# Patient Record
Sex: Female | Born: 1950 | Race: Black or African American | Hispanic: No | Marital: Married | Smoking: Never smoker
Health system: Southern US, Community
[De-identification: ages and names within clinical notes are randomized; demographics above are authoritative.]

## PROBLEM LIST (undated history)

## (undated) DIAGNOSIS — M1990 Unspecified osteoarthritis, unspecified site: Secondary | ICD-10-CM

## (undated) DIAGNOSIS — Z973 Presence of spectacles and contact lenses: Secondary | ICD-10-CM

## (undated) DIAGNOSIS — N189 Chronic kidney disease, unspecified: Secondary | ICD-10-CM

## (undated) DIAGNOSIS — G4730 Sleep apnea, unspecified: Secondary | ICD-10-CM

## (undated) DIAGNOSIS — E785 Hyperlipidemia, unspecified: Secondary | ICD-10-CM

## (undated) DIAGNOSIS — Z95810 Presence of automatic (implantable) cardiac defibrillator: Secondary | ICD-10-CM

## (undated) DIAGNOSIS — Z4502 Encounter for adjustment and management of automatic implantable cardiac defibrillator: Secondary | ICD-10-CM

## (undated) DIAGNOSIS — G5603 Carpal tunnel syndrome, bilateral upper limbs: Secondary | ICD-10-CM

## (undated) DIAGNOSIS — E669 Obesity, unspecified: Secondary | ICD-10-CM

## (undated) DIAGNOSIS — I10 Essential (primary) hypertension: Secondary | ICD-10-CM

## (undated) DIAGNOSIS — K219 Gastro-esophageal reflux disease without esophagitis: Secondary | ICD-10-CM

## (undated) HISTORY — PX: ABDOMINAL HYSTERECTOMY: SHX81

## (undated) HISTORY — DX: Encounter for adjustment and management of automatic implantable cardiac defibrillator: Z45.02

## (undated) HISTORY — PX: ICD IMPLANT: EP1208

## (undated) HISTORY — DX: Unspecified osteoarthritis, unspecified site: M19.90

## (undated) HISTORY — PX: JOINT REPLACEMENT: SHX530

## (undated) HISTORY — PX: CHOLECYSTECTOMY: SHX55

## (undated) HISTORY — DX: Obesity, unspecified: E66.9

## (undated) HISTORY — PX: DILATION AND CURETTAGE OF UTERUS: SHX78

---
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
## 1999-10-11 ENCOUNTER — Encounter: Admission: RE | Admit: 1999-10-11 | Discharge: 1999-10-11 | Payer: Self-pay | Admitting: Internal Medicine

## 1999-10-11 ENCOUNTER — Encounter: Payer: Self-pay | Admitting: Internal Medicine

## 1999-10-14 ENCOUNTER — Encounter: Admission: RE | Admit: 1999-10-14 | Discharge: 2000-01-12 | Payer: Self-pay | Admitting: Internal Medicine

## 2000-01-10 ENCOUNTER — Encounter: Admission: RE | Admit: 2000-01-10 | Discharge: 2000-01-10 | Payer: Self-pay | Admitting: Internal Medicine

## 2000-01-10 ENCOUNTER — Encounter: Payer: Self-pay | Admitting: Internal Medicine

## 2000-03-30 ENCOUNTER — Encounter: Payer: Self-pay | Admitting: Internal Medicine

## 2000-03-30 ENCOUNTER — Encounter: Admission: RE | Admit: 2000-03-30 | Discharge: 2000-03-30 | Payer: Self-pay | Admitting: Internal Medicine

## 2000-06-04 ENCOUNTER — Encounter: Payer: Self-pay | Admitting: Gastroenterology

## 2000-06-04 ENCOUNTER — Ambulatory Visit (HOSPITAL_COMMUNITY): Admission: RE | Admit: 2000-06-04 | Discharge: 2000-06-04 | Payer: Self-pay | Admitting: Gastroenterology

## 2000-06-10 ENCOUNTER — Ambulatory Visit (HOSPITAL_COMMUNITY): Admission: RE | Admit: 2000-06-10 | Discharge: 2000-06-10 | Payer: Self-pay | Admitting: Gastroenterology

## 2000-07-15 ENCOUNTER — Ambulatory Visit (HOSPITAL_COMMUNITY): Admission: RE | Admit: 2000-07-15 | Discharge: 2000-07-15 | Payer: Self-pay | Admitting: Gastroenterology

## 2001-06-29 ENCOUNTER — Encounter: Payer: Self-pay | Admitting: Internal Medicine

## 2001-06-29 ENCOUNTER — Encounter: Admission: RE | Admit: 2001-06-29 | Discharge: 2001-06-29 | Payer: Self-pay | Admitting: Internal Medicine

## 2001-08-19 ENCOUNTER — Encounter: Payer: Self-pay | Admitting: Surgery

## 2001-08-23 ENCOUNTER — Encounter (INDEPENDENT_AMBULATORY_CARE_PROVIDER_SITE_OTHER): Payer: Self-pay | Admitting: *Deleted

## 2001-08-23 ENCOUNTER — Ambulatory Visit (HOSPITAL_COMMUNITY): Admission: RE | Admit: 2001-08-23 | Discharge: 2001-08-24 | Payer: Self-pay | Admitting: Surgery

## 2001-08-31 ENCOUNTER — Other Ambulatory Visit: Admission: RE | Admit: 2001-08-31 | Discharge: 2001-08-31 | Payer: Self-pay | Admitting: Obstetrics and Gynecology

## 2002-07-18 ENCOUNTER — Encounter: Payer: Self-pay | Admitting: Internal Medicine

## 2002-07-18 ENCOUNTER — Ambulatory Visit (HOSPITAL_COMMUNITY): Admission: RE | Admit: 2002-07-18 | Discharge: 2002-07-18 | Payer: Self-pay | Admitting: Internal Medicine

## 2002-08-15 ENCOUNTER — Encounter: Admission: RE | Admit: 2002-08-15 | Discharge: 2002-08-15 | Payer: Self-pay | Admitting: Gastroenterology

## 2002-08-15 ENCOUNTER — Encounter: Payer: Self-pay | Admitting: Gastroenterology

## 2003-06-29 ENCOUNTER — Encounter: Admission: RE | Admit: 2003-06-29 | Discharge: 2003-06-29 | Payer: Self-pay | Admitting: Family Medicine

## 2003-07-13 ENCOUNTER — Encounter: Admission: RE | Admit: 2003-07-13 | Discharge: 2003-07-13 | Payer: Self-pay | Admitting: Family Medicine

## 2004-03-27 ENCOUNTER — Encounter: Admission: RE | Admit: 2004-03-27 | Discharge: 2004-03-27 | Payer: Self-pay | Admitting: Internal Medicine

## 2004-04-08 ENCOUNTER — Encounter: Admission: RE | Admit: 2004-04-08 | Discharge: 2004-04-08 | Payer: Self-pay | Admitting: Internal Medicine

## 2004-05-02 ENCOUNTER — Ambulatory Visit (HOSPITAL_BASED_OUTPATIENT_CLINIC_OR_DEPARTMENT_OTHER): Admission: RE | Admit: 2004-05-02 | Discharge: 2004-05-02 | Payer: Self-pay | Admitting: Orthopaedic Surgery

## 2004-05-02 ENCOUNTER — Ambulatory Visit (HOSPITAL_COMMUNITY): Admission: RE | Admit: 2004-05-02 | Discharge: 2004-05-02 | Payer: Self-pay | Admitting: Orthopaedic Surgery

## 2004-05-03 ENCOUNTER — Ambulatory Visit (HOSPITAL_COMMUNITY): Admission: RE | Admit: 2004-05-03 | Discharge: 2004-05-03 | Payer: Self-pay | Admitting: Orthopaedic Surgery

## 2004-05-05 HISTORY — PX: CERVICAL SPINE SURGERY: SHX589

## 2004-05-24 ENCOUNTER — Inpatient Hospital Stay (HOSPITAL_COMMUNITY): Admission: RE | Admit: 2004-05-24 | Discharge: 2004-05-25 | Payer: Self-pay | Admitting: Neurosurgery

## 2004-09-13 ENCOUNTER — Ambulatory Visit (HOSPITAL_COMMUNITY): Admission: RE | Admit: 2004-09-13 | Discharge: 2004-09-13 | Payer: Self-pay | Admitting: Orthopaedic Surgery

## 2004-11-29 ENCOUNTER — Encounter: Admission: RE | Admit: 2004-11-29 | Discharge: 2004-11-29 | Payer: Self-pay | Admitting: Orthopaedic Surgery

## 2004-12-20 ENCOUNTER — Encounter: Admission: RE | Admit: 2004-12-20 | Discharge: 2004-12-20 | Payer: Self-pay | Admitting: Orthopaedic Surgery

## 2005-03-02 ENCOUNTER — Encounter: Admission: RE | Admit: 2005-03-02 | Discharge: 2005-03-02 | Payer: Self-pay | Admitting: Neurosurgery

## 2005-03-20 ENCOUNTER — Ambulatory Visit: Payer: Self-pay | Admitting: Physical Medicine & Rehabilitation

## 2005-03-20 ENCOUNTER — Inpatient Hospital Stay (HOSPITAL_COMMUNITY): Admission: RE | Admit: 2005-03-20 | Discharge: 2005-03-26 | Payer: Self-pay | Admitting: Orthopaedic Surgery

## 2007-02-15 ENCOUNTER — Encounter: Admission: RE | Admit: 2007-02-15 | Discharge: 2007-02-15 | Payer: Self-pay | Admitting: Ophthalmology

## 2007-02-21 ENCOUNTER — Encounter: Admission: RE | Admit: 2007-02-21 | Discharge: 2007-02-21 | Payer: Self-pay | Admitting: Ophthalmology

## 2008-04-01 ENCOUNTER — Emergency Department (HOSPITAL_BASED_OUTPATIENT_CLINIC_OR_DEPARTMENT_OTHER): Admission: EM | Admit: 2008-04-01 | Discharge: 2008-04-01 | Payer: Self-pay | Admitting: Emergency Medicine

## 2009-01-26 ENCOUNTER — Ambulatory Visit: Payer: Self-pay | Admitting: Diagnostic Radiology

## 2009-01-26 ENCOUNTER — Emergency Department (HOSPITAL_BASED_OUTPATIENT_CLINIC_OR_DEPARTMENT_OTHER): Admission: EM | Admit: 2009-01-26 | Discharge: 2009-01-26 | Payer: Self-pay | Admitting: Emergency Medicine

## 2009-02-16 ENCOUNTER — Encounter: Admission: RE | Admit: 2009-02-16 | Discharge: 2009-02-16 | Payer: Self-pay | Admitting: Internal Medicine

## 2009-04-21 ENCOUNTER — Other Ambulatory Visit: Payer: Self-pay | Admitting: Emergency Medicine

## 2009-04-21 ENCOUNTER — Ambulatory Visit: Payer: Self-pay | Admitting: Internal Medicine

## 2009-04-21 ENCOUNTER — Inpatient Hospital Stay (HOSPITAL_COMMUNITY): Admission: EM | Admit: 2009-04-21 | Discharge: 2009-04-24 | Payer: Self-pay | Admitting: Cardiology

## 2009-05-22 ENCOUNTER — Ambulatory Visit: Payer: Self-pay | Admitting: Family Medicine

## 2009-10-24 ENCOUNTER — Encounter: Admission: RE | Admit: 2009-10-24 | Discharge: 2009-10-24 | Payer: Self-pay | Admitting: Gastroenterology

## 2010-05-25 ENCOUNTER — Encounter: Payer: Self-pay | Admitting: Chiropractic Medicine

## 2010-05-26 ENCOUNTER — Encounter: Payer: Self-pay | Admitting: Neurosurgery

## 2010-06-04 NOTE — Assessment & Plan Note (Signed)
Summary: WEIGHT MGT CLASS WITH DR SYKES/[facility]

## 2010-08-05 LAB — BASIC METABOLIC PANEL
BUN: 11 mg/dL (ref 6–23)
BUN: 8 mg/dL (ref 6–23)
BUN: 9 mg/dL (ref 6–23)
BUN: 13 mg/dL (ref 6–23)
CO2: 25 mEq/L (ref 19–32)
CO2: 26 mEq/L (ref 19–32)
CO2: 26 mEq/L (ref 19–32)
CO2: 22 mEq/L (ref 19–32)
Calcium: 8.4 mg/dL (ref 8.4–10.5)
Calcium: 8.6 mg/dL (ref 8.4–10.5)
Calcium: 8.9 mg/dL (ref 8.4–10.5)
Calcium: 9.2 mg/dL (ref 8.4–10.5)
Chloride: 100 mEq/L (ref 96–112)
Chloride: 102 mEq/L (ref 96–112)
Chloride: 103 mEq/L (ref 96–112)
Chloride: 103 mEq/L (ref 96–112)
Creatinine, Ser: 0.75 mg/dL (ref 0.4–1.2)
Creatinine, Ser: 0.78 mg/dL (ref 0.4–1.2)
Creatinine, Ser: 0.79 mg/dL (ref 0.4–1.2)
Creatinine, Ser: 0.7 mg/dL (ref 0.4–1.2)
GFR calc Af Amer: >60 mL/min (ref >60)
GFR calc Af Amer: >60 mL/min (ref >60)
GFR calc Af Amer: >60 mL/min (ref >60)
GFR calc Af Amer: >60 mL/min (ref >60)
GFR calc non Af Amer: >60 mL/min (ref >60)
GFR calc non Af Amer: >60 mL/min (ref >60)
GFR calc non Af Amer: >60 mL/min (ref >60)
GFR calc non Af Amer: >60 mL/min (ref >60)
Glucose, Bld: 128 mg/dL — ABNORMAL HIGH (ref 70–99)
Glucose, Bld: 176 mg/dL — ABNORMAL HIGH (ref 70–99)
Glucose, Bld: 234 mg/dL — ABNORMAL HIGH (ref 70–99)
Glucose, Bld: 163 mg/dL — ABNORMAL HIGH (ref 70–99)
Potassium: 3.7 mEq/L (ref 3.5–5.1)
Potassium: 3.9 mEq/L (ref 3.5–5.1)
Potassium: 4.1 mEq/L (ref 3.5–5.1)
Potassium: 5 mEq/L (ref 3.5–5.1)
Sodium: 133 mEq/L — ABNORMAL LOW (ref 135–145)
Sodium: 135 mEq/L (ref 135–145)
Sodium: 137 mEq/L (ref 135–145)
Sodium: 140 mEq/L (ref 135–145)

## 2010-08-05 LAB — H. PYLORI ANTIBODY, IGG: H Pylori IgG: 3.7 {ISR} — ABNORMAL HIGH

## 2010-08-05 LAB — CBC
HCT: 37.1 % (ref 36.0–46.0)
HCT: 38.3 % (ref 36.0–46.0)
HCT: 38.5 % (ref 36.0–46.0)
HCT: 39 % (ref 36.0–46.0)
HCT: 39.7 % (ref 36.0–46.0)
Hemoglobin: 12.5 g/dL (ref 12.0–15.0)
Hemoglobin: 12.6 g/dL (ref 12.0–15.0)
Hemoglobin: 12.7 g/dL (ref 12.0–15.0)
Hemoglobin: 12.9 g/dL (ref 12.0–15.0)
Hemoglobin: 13.6 g/dL (ref 12.0–15.0)
MCHC: 32.8 g/dL (ref 30.0–36.0)
MCHC: 32.9 g/dL (ref 30.0–36.0)
MCHC: 33 g/dL (ref 30.0–36.0)
MCHC: 33.6 g/dL (ref 30.0–36.0)
MCHC: 34.2 g/dL (ref 30.0–36.0)
MCV: 84.3 fL (ref 78.0–100.0)
MCV: 84.4 fL (ref 78.0–100.0)
MCV: 84.6 fL (ref 78.0–100.0)
MCV: 84.7 fL (ref 78.0–100.0)
MCV: 83.6 fL (ref 78.0–100.0)
Platelets: 254 10*3/uL (ref 150–400)
Platelets: 282 10*3/uL (ref 150–400)
Platelets: 289 K/uL (ref 150–400)
Platelets: 313 10*3/uL (ref 150–400)
Platelets: 324 K/uL (ref 150–400)
RBC: 4.4 MIL/uL (ref 3.87–5.11)
RBC: 4.52 MIL/uL (ref 3.87–5.11)
RBC: 4.57 MIL/uL (ref 3.87–5.11)
RBC: 4.61 MIL/uL (ref 3.87–5.11)
RBC: 4.75 MIL/uL (ref 3.87–5.11)
RDW: 14.1 % (ref 11.5–15.5)
RDW: 14.2 % (ref 11.5–15.5)
RDW: 14.3 % (ref 11.5–15.5)
RDW: 14.7 % (ref 11.5–15.5)
RDW: 13.7 % (ref 11.5–15.5)
WBC: 11.6 10*3/uL — ABNORMAL HIGH (ref 4.0–10.5)
WBC: 7.7 10*3/uL (ref 4.0–10.5)
WBC: 7.8 K/uL (ref 4.0–10.5)
WBC: 8.9 10*3/uL (ref 4.0–10.5)
WBC: 10.6 K/uL — ABNORMAL HIGH (ref 4.0–10.5)

## 2010-08-05 LAB — DIFFERENTIAL
Basophils Absolute: 0 10*3/uL (ref 0.0–0.1)
Basophils Absolute: 0.2 K/uL — ABNORMAL HIGH (ref 0.0–0.1)
Basophils Relative: 0 % (ref 0–1)
Basophils Relative: 2 % — ABNORMAL HIGH (ref 0–1)
Eosinophils Absolute: 0.3 10*3/uL (ref 0.0–0.7)
Eosinophils Absolute: 0.2 K/uL (ref 0.0–0.7)
Eosinophils Relative: 3 % (ref 0–5)
Eosinophils Relative: 2 % (ref 0–5)
Lymphocytes Relative: 31 % (ref 12–46)
Lymphocytes Relative: 35 % (ref 12–46)
Lymphs Abs: 2.8 10*3/uL (ref 0.7–4.0)
Lymphs Abs: 3.7 K/uL (ref 0.7–4.0)
Monocytes Absolute: 0.5 10*3/uL (ref 0.1–1.0)
Monocytes Absolute: 0.4 K/uL (ref 0.1–1.0)
Monocytes Relative: 6 % (ref 3–12)
Monocytes Relative: 4 % (ref 3–12)
Neutro Abs: 5.3 10*3/uL (ref 1.7–7.7)
Neutro Abs: 6.1 K/uL (ref 1.7–7.7)
Neutrophils Relative %: 60 % (ref 43–77)
Neutrophils Relative %: 58 % (ref 43–77)

## 2010-08-05 LAB — C-REACTIVE PROTEIN: CRP: 0.3 mg/dL — ABNORMAL LOW (ref <0.6)

## 2010-08-05 LAB — HEMOGLOBIN A1C
Hgb A1c MFr Bld: 9.4 % — ABNORMAL HIGH (ref 4.6–6.1)
Mean Plasma Glucose: 223 mg/dL

## 2010-08-05 LAB — SEDIMENTATION RATE: Sed Rate: 21 mm/hr (ref 0–22)

## 2010-08-05 LAB — CARDIAC PANEL(CRET KIN+CKTOT+MB+TROPI)
CK, MB: 0.5 ng/mL (ref 0.3–4.0)
CK, MB: 0.5 ng/mL (ref 0.3–4.0)
CK, MB: 0.6 ng/mL (ref 0.3–4.0)
CK, MB: 0.6 ng/mL (ref 0.3–4.0)
Relative Index: INVALID (ref 0.0–2.5)
Relative Index: INVALID (ref 0.0–2.5)
Relative Index: INVALID (ref 0.0–2.5)
Relative Index: INVALID (ref 0.0–2.5)
Total CK: 46 U/L (ref 7–177)
Total CK: 47 U/L (ref 7–177)
Total CK: 51 U/L (ref 7–177)
Total CK: 52 U/L (ref 7–177)
Troponin I: 0.01 ng/mL (ref 0.00–0.06)
Troponin I: 0.01 ng/mL (ref 0.00–0.06)
Troponin I: 0.02 ng/mL (ref 0.00–0.06)
Troponin I: <0.01 ng/mL (ref 0.00–0.06)

## 2010-08-05 LAB — GLUCOSE, CAPILLARY
Glucose-Capillary: 147 mg/dL — ABNORMAL HIGH (ref 70–99)
Glucose-Capillary: 165 mg/dL — ABNORMAL HIGH (ref 70–99)
Glucose-Capillary: 186 mg/dL — ABNORMAL HIGH (ref 70–99)
Glucose-Capillary: 192 mg/dL — ABNORMAL HIGH (ref 70–99)
Glucose-Capillary: 211 mg/dL — ABNORMAL HIGH (ref 70–99)
Glucose-Capillary: 213 mg/dL — ABNORMAL HIGH (ref 70–99)
Glucose-Capillary: 218 mg/dL — ABNORMAL HIGH (ref 70–99)
Glucose-Capillary: 221 mg/dL — ABNORMAL HIGH (ref 70–99)
Glucose-Capillary: 229 mg/dL — ABNORMAL HIGH (ref 70–99)
Glucose-Capillary: 239 mg/dL — ABNORMAL HIGH (ref 70–99)
Glucose-Capillary: 241 mg/dL — ABNORMAL HIGH (ref 70–99)
Glucose-Capillary: 269 mg/dL — ABNORMAL HIGH (ref 70–99)

## 2010-08-05 LAB — APTT
aPTT: 62 seconds — ABNORMAL HIGH (ref 24–37)
aPTT: 26 seconds (ref 24–37)

## 2010-08-05 LAB — TSH: TSH: 1.375 u[IU]/mL (ref 0.350–4.500)

## 2010-08-05 LAB — BASIC METABOLIC PANEL WITH GFR
BUN: 8 mg/dL (ref 6–23)
CO2: 25 meq/L (ref 19–32)
Calcium: 8.8 mg/dL (ref 8.4–10.5)
Chloride: 102 meq/L (ref 96–112)
Creatinine, Ser: 0.8 mg/dL (ref 0.4–1.2)
GFR calc non Af Amer: >60 mL/min
Glucose, Bld: 250 mg/dL — ABNORMAL HIGH (ref 70–99)
Potassium: 4.1 meq/L (ref 3.5–5.1)
Sodium: 134 meq/L — ABNORMAL LOW (ref 135–145)

## 2010-08-05 LAB — HEPARIN LEVEL (UNFRACTIONATED)
Heparin Unfractionated: 0.22 IU/mL — ABNORMAL LOW (ref 0.30–0.70)
Heparin Unfractionated: 0.38 IU/mL (ref 0.30–0.70)

## 2010-08-05 LAB — POCT CARDIAC MARKERS
CKMB, poc: <1 ng/mL — ABNORMAL LOW (ref 1.0–8.0)
CKMB, poc: <1 ng/mL — ABNORMAL LOW (ref 1.0–8.0)
Myoglobin, poc: 22 ng/mL (ref 12–200)
Myoglobin, poc: 50.6 ng/mL (ref 12–200)
Troponin i, poc: <0.05 ng/mL (ref 0.00–0.09)
Troponin i, poc: <0.05 ng/mL (ref 0.00–0.09)

## 2010-08-05 LAB — POCT B-TYPE NATRIURETIC PEPTIDE (BNP): B Natriuretic Peptide, POC: 5.3 pg/mL (ref 0–100)

## 2010-08-05 LAB — LIPID PANEL
HDL: 37 mg/dL — ABNORMAL LOW
Total CHOL/HDL Ratio: 3.8 ratio
Triglycerides: 225 mg/dL — ABNORMAL HIGH
VLDL: 45 mg/dL — ABNORMAL HIGH (ref 0–40)

## 2010-08-05 LAB — PROTIME-INR
INR: 0.98 (ref 0.00–1.49)
INR: 0.93 (ref 0.00–1.49)
Prothrombin Time: 12.9 seconds (ref 11.6–15.2)
Prothrombin Time: 12.4 seconds (ref 11.6–15.2)

## 2010-08-05 LAB — D-DIMER, QUANTITATIVE: D-Dimer, Quant: 0.33 ug/mL-FEU (ref 0.00–0.48)

## 2010-09-20 NOTE — Discharge Summary (Signed)
NAMECECILYA, Yvonne Miller NO.:  [id number]   MEDICAL RECORD NO.:  [id number]          PATIENT TYPE:  INP   LOCATION:  58                         FACILITY:  [facility]   PHYSICIAN:  Lind Guest. Ninfa Linden, M.D.DATE OF BIRTH:  [birth year]   DATE OF ADMISSION:  03/20/2005  DATE OF DISCHARGE:  03/26/2005                                 DISCHARGE SUMMARY   ADMISSION DIAGNOSIS:  Severe degenerative joint disease and osteoarthritis,  right knee.   DISCHARGE DIAGNOSIS:  Severe degenerative joint disease and osteoarthritis,  right knee.   PROCEDURE:  Right total knee arthroplasty on March 20, 2005.   HOSPITAL COURSE:  Briefly, Yvonne Miller is a pleasant 60 year old who had  previous knee surgeries involving her right knee several years back.  She  has continued to have right knee pain and arthroscopy was performed.  This  temporized things for awhile with her knee in terms of pain but after  continued pain in her knee, it was recommended she undergo total knee  replacement.  She is 60 years old and a Jehovah's Witness but had  significant arthritis in her knee.  It is recommended she undergo this under  navigation and tourniquet with the risks and benefits well explained to her.  She had been on iron and her hemoglobin and hematocrit were within normal  limits.  She did understand, again, the risks and benefits of blood loss and  there were specific instructions to refrain from any transfusion whatsoever.  On the day of admission, she was taken to the operating room where under a  block as well as general anesthesia, she underwent right total knee  replacement without complications.  Postoperatively, she was admitted to a  regular floor bed and began a process of physical therapy including range of  motion of the knee using a CPM.  She progressed well through physical  therapy and was eventually started on oral pain medications and other oral  medications which she tolerated  well.  She was also started on Coumadin for  DVT prophylaxis.  On the day of discharge, physical therapy and occupational  therapy had deemed her safe for discharge to home with further home health  therapy.  Social work and case management were helpful in getting the  equipment that she needed.  On the day of discharge, she was afebrile with  stable vital signs, was tolerating a regular diet and oral pain medications,  and it was felt she could be discharged safely to home.  Her incision was  found to be clean, dry, and intact.   DISPOSITION:  To home.   DISCHARGE MEDICATIONS:  1.  Iron sulfate.  2.  Metformin.  3.  Wellbutrin.  4.  Crestor.  5.  Lamictal.  6.  Lasix.  7.  Glimepiride.  8.  Actos.  9.  Benicar.  10. Nexium.  11. Coumadin.  12. Oxycodone as needed.  13. Cardizem LA 240 mg.  14. Lantus insulin.  15. Inhalers as needed.   DISCHARGE INSTRUCTIONS:  While she is at home, she will continue to have  twice weekly blood  draws for adjustment of her Coumadin for an INR of 2 to  2.5.  She will continue with physical therapy with weight-bearing as  tolerated for gait training as well as range of motion of her knee and  eventual strengthening.  A follow up appointment is established in my office  in two weeks for wound assessment and general postoperative check.           ______________________________  Lind Guest. Ninfa Linden, M.D.     CYB/MEDQ  D:  05/07/2005  T:  05/07/2005  Job:  [id number]

## 2010-09-20 NOTE — Op Note (Signed)
[place]. [facility]  Patient:    Yvonne Miller, Yvonne Miller Visit Number: [id number] MRN: [id number]          Service Type: DSU Location: [phone] Attending Physician:  Harl Bowie Dictated by:   Coralie Keens, M.D. Proc. Date: 08/23/01 Admit Date:  08/23/2001 Discharge Date: 08/24/2001                             Operative Report  PREOPERATIVE DIAGNOSIS:  Gallbladder polyps, abdominal pain.  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  OPERATION PERFORMED:  Laparoscopic cholecystectomy.  SURGEON:  Coralie Keens, M.D.  ASSISTANT:  [name] T. Hoxworth, M.D.  ANESTHESIA:  General endotracheal anesthesia.  ESTIMATED BLOOD LOSS:  Minimal.  DESCRIPTION OF PROCEDURE:  Patient brought to operating room and identified as Yvonne Miller.  She was placed supine on the operating table and general anesthesia was induced.  Her abdomen was then prepped and draped in the usual sterile fashion.  Using a #15 blade, a small vertical incision was made below the umbilicus.  This was carried down through the fascia which was then opened with a scalpel.  A Kelly clamp was then used to pass into the peritoneal cavity.  A 0 Vicryl pursestring suture was then placed around the fascial opening.  The Hasson port was placed and insufflation of the abdomen was begun.  An 11 mm port was then placed in the patients epigastrium and two 5 mm ports were placed in the patients right flank under direct vision. Because of the patients morbid obesity and large omentum, the 0 degree scope had to be changed to a 30 degree scope.  Finally, the gallbladder could be identified and retracted above the liver bed.  Dissection was then carried out at the base.  The cystic duct was dissected out and clipped three times proximally, once distally and transected with  scissors.  The cystic artery was identified and clipped twice  proximally, once distally and transected as well.  The gallbladder was then dissected free from the liver bed with the electrocautery.  Hemostasis was achieved with the cautery in the liver bed. The gallbladder was then removed through the incision at the umbilicus.  The 0 Vicryl at the umbilicus was then tied in place, closing the fascial defect. Again the liver bed was examined and hemostasis was achieved.  All ports were then removed under direct vision and the abdomen was deflated.  All incisions were then anesthetized with 0.25% Marcaine and then closed with 4-0 Vicryl subcuticular sutures.  Steri-Strips, gauze and tape were then applied.  The patient tolerated the procedure well.  All sponge, needle and instrument counts were correct at the end of the procedure.  The patient was then extubated in the operating room and taken in stable condition to the recovery room. Dictated by:   Coralie Keens, M.D. Attending Physician:  Harl Bowie DD:  08/23/01 TD:  08/23/01 Job: [id number]

## 2010-09-20 NOTE — Procedures (Signed)
[place]. [facility]  Patient:    Yvonne Miller, Yvonne Miller                       MRN: [id number] Proc. Date: 06/10/00 Adm. Date:  [id number] Attending:  Juanita Craver CC:         Dr. [name]   Procedure Report  DATE OF BIRTH:  12-08-1950  REFERRING PHYSICIAN:  PROCEDURE PERFORMED:  Esophagogastroduodenoscopy.  ENDOSCOPIST:  Nelwyn Salisbury, M.D.  INSTRUMENT USED:  Olympus video panendoscope.  INDICATIONS FOR PROCEDURE:  Abdominal pain especially in the right upper quadrant epigastric area in a 60 year old obese African-American female. Abdominal ultrasound has been unrevealing rule out peptic ulcer disease, esophagitis, gastritis etc.  PREPROCEDURE PREPARATION:  Informed consent was procured from the patient. The patient was fasted for eight hours prior to the procedure.  PREPROCEDURE PHYSICAL:  The patient had stable vital signs.  Neck supple. Chest clear to auscultation.  S1, S2 regular.  Abdomen soft with normal abdominal bowel sounds.  Epigastric and right upper quadrant tenderness on palpation with guarding, no rebound, no rigidity, no hepatosplenomegaly.  DESCRIPTION OF PROCEDURE:  The patient was placed in left lateral decubitus position and sedated with 50 mg of Demerol and 5 mg of Versed intravenously. Once the patient was adequately sedated and maintained on low-flow oxygen and continuous cardiac monitoring, the Olympus video panendoscope was advanced through the mouthpiece, over the tongue, into the esophagus under direct vision.  The entire esophagus appeared normal without evidence of ring, stricture, masses, lesions, esophagitis or Barretts mucosa.  The scope was then advanced into the stomach.  There was moderate diffuse gastritis throughout the gastric mucosa with no frank ulcers.  Erosions were seen in the proximal small bowel.  The duodenal bulb appeared normal.  IMPRESSION: 1. Normal-appearing esophagus and proximal small  bowel. 2. Moderate diffuse gastritis, no ulcer seen.  RECOMMENDATION: 1. Continue Nexium for now.  Avoid all nonsteroidals including aspirin. 2. Increase fluid and fiber in the diet and maintain bowel regularity with    stool softeners if needed. 3. Outpatient follow-up in the next four weeks. DD:  06/10/00 TD:  06/11/00 Job: [id number]

## 2010-09-20 NOTE — Op Note (Signed)
NAMENATE, BENTZEN NO.:  [id number]   MEDICAL RECORD NO.:  [id number]          PATIENT TYPE:  INP   LOCATION:  36                         FACILITY:  [facility]   PHYSICIAN:  Lind Guest. Ninfa Linden, M.D.DATE OF BIRTH:  1951-01-18   DATE OF PROCEDURE:  03/20/2005  DATE OF DISCHARGE:                                 OPERATIVE REPORT   PREOPERATIVE DIAGNOSIS:  Right knee severe degenerative joint disease.   POSTOPERATIVE DIAGNOSIS:  Right knee severe degenerative joint disease.   PROCEDURE:  Right total knee arthroplasty.   COMPONENTS:  DePuy/[employer] rotating platform Sigma total knee  arthroplasty with size three femoral component, size three tibial component,  32 mm patella button and 12.5 mm polyethylene insert (posterior cruciate  substituting).   SURGEON:  Lind Guest. Ninfa Linden, M.D.   ASSISTANT:  Anderson Malta, M.D.   ANESTHESIA:  1.  General.  2.  Femoral block.   ANTIBIOTICS:  IV Ancef 1 g.   BLOOD LOSS:  450 mL.   TOURNIQUET TIME:  Two hours.   COMPLICATIONS:  None.   INDICATIONS:  Briefly, Ms. Geissinger is a very pleasant 60 year old patient who  I have seen for over a year now. She had had previous surgery on her right  knee between 10 and 15 years ago for cartilage damage she sustained in  another injury. I had even performed arthroscopic surgery on the knee within  the last year due to significant pain in her  knee and meniscal tearing.  After physical therapy, nonsteroidal anti-inflammatory drugs and injections  failed to improve her pain.  She elected to proceed with total knee  arthroplasty.  I had even tried a series of ORTHOVISC injections in her knee  as well. She felt that the knee pain was inhibiting her activities of daily  living and was making her life quite difficult due to the pain that she is  having.  The information was given to her about total knee arthroplasty and  she wished to proceed with total knee  arthroplasty.  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  Preoperatively  she has been on iron supplements and her preoperative hemoglobin was 12.0.  The risks and benefits of surgery were explained to her including the risk  of blood loss and she agreed to proceed with surgery as long as I will  adhere to her wishes of no blood products.   DESCRIPTION OF PROCEDURE:  After informed consent was obtained and the  appropriate right leg was marked, ms. Padgett was brought to the operating  room, placed supine on the operating table. General anesthesia was then  obtained and after general anesthesia was obtained, a femoral block was  placed as well. A nonsterile tourniquet was then placed around her upper  thigh and then her leg was prepped and draped with DuraPrep and sterile  drapes including a sterile stockinette. An Esmarch was used to wrap out leg  and the tourniquet was inflated 350 mm of pressure.  A decision to used  computer navigation to assist the surgery using the DePuy Brain Lab System  was made prior to starting the case as well. This was in the preoperative  planning stages and was felt that she would benefit from this as well due to  the need of not using intramedullary guides for the femur which could help  with decreasing blood loss as well.  A previous incision was noted on her  leg that was a medial peripatellar incision and this was incorporated into  our incision so a midline incision was not carried out.  I was able to be  carry this sharply with a knife and then was able to reflect the patella  laterally taking a nice cuff of tissue.  The knee joint was then exposed and  there was significant tricompartmental degenerative arthritis with complete  wear of cartilage on the medial ad  lateral condyles and underneath the  patella and significant osteophytes throughout the knee.  The osteophytes  were removed with a rongeur and the knee was prepared for navigation.  Two  proximal Steinmann pins were placed well proximal to where the implant was  placed and these were drilled with bicortical Steinmann pins.  This was also  performed in the distal tibia distal to implant placement. These pins were  made through separate stab incisions in the skin.  Infrared globes were then  placed on these Steinmann pins using the navigation system for brain lab.  Next, using brain lab system, areas were mapped out on the medial and  lateral malleolus, the anterior tibia, posterior tibia, medial and lateral  tibia, medial and lateral femoral epicondyles, the anterior surfaces of the  femur, __________ lines and the ACL insertion point.  The knee and hip were  put through a gentle range of motion and the computer system was used to  help map out the knee for helping [place] with cuts.  Of note, she was in  approximately 3-5 degrees of varus angulation prior to starting the case.  From an implant standpoint, we started first with our tibial cuts, the  tibial guide was placed using reference points off of the computer and a  tibia cut was made with oscillating saw.  Once the tibial cut was then made  the knee was remained in a flexed position and a distal femoral cut was made  as well. After this, the balancing guide was used with the computer to make  sure that the flexion and tension gaps were equal.  A medial release was  then carried out using a osteotome and a Cobb against the medial structures  with assuring that the medial collateral ligament remained intact.  Next, a  guide was placed on the femur and a size three was felt to be appropriate.  This was matched to the size three that was selected by the computer as well.  An anterior femoral cut was made as well as the posterior femoral  cuts  and the chamfer cuts were made as well. A size three trial component  was then placed and the femur size three trial was placed on the tibia.  Using the size three trial component on the tibia, the reverse reamer was  used to ream the tibia for final tibial preparation.  A 12.5 mm trial  polyethylene insert was then  placed with the trial components in place.  The  knee was put through a gentle range of motion and this was found to be  stable. This was confirmed computer as well. Next, the patella was prepared  using freehand technique.  The patella was inverted with towel clips and  calipers were used to measure the thickness of the patella and it was  measured at 20 mm of thickness. A cut was made across the patella and then a  size 32 patellar button was utilized and drills were made into the patella  and a trial patella was placed as well. The knee was again put through a  gentle range of motion and found to be stable and thus ready for the real  components.  The knee was then copiously irrigated with pulsatile lavage  using normal saline solution. At this point, two hours of  tourniquet time  was up, so we elected to let the tourniquet down to allow for hemostasis,  again taking consideration the patient was Jehovah's Witness and we wanted  to maintain as appropriate hemostasis as possible.  Electrocautery was used  to help with this.  After a period of 10 minutes had passed, an Esmarch was  used to wrap out the leg again and the tourniquet was inflated for an  additional 15 minutes to allow for cementing of the final components.  DePuy  cement was used without antibiotics and the tibial component was cemented in  place, followed by the femoral component.  Meticulous care was taken to  remove any remaining cement debris as well as freeing the posterior condyles  of the femur of any osteophytes and other debris.  Of note, dissection was  carried out to remove the menisci as well. Once the  trial components were  cemented in place and the patellar button was cemented in place, these were  allowed to dry.  The tourniquet was then let down again and was removed from  the leg.  Once the components were cemented and dried in place, the leg was  irrigated with  3 liters of normal saline solution using pulsatile lavage. Hemostasis was  obtained again with electrocautery and the arthrotomy incision was closed  with #1 interrupted Ethibond suture followed by 0 Vicryl in the deep tissue,  2-0 Vicryl in the subcutaneous tissue and interrupted format and staples on  the skin. Hemovac drain was not placed. The Steinmann pins removed from the  femur and the tibia that were utilized for the navigation portion of the  case and these were closed with staples as well as.  A well padded sterile  dressing was applied.  Prior to placing the dressing, the knee was infiltrated with Marcaine, epinephrine and clonidine mixture. The patient's  knee was then placed a knee immobilizer and she was awakened, extubated and  taken to recovery room in stable condition. Again, there were no  complications.           ______________________________  Lind Guest. Ninfa Linden, M.D.     CYB/MEDQ  D:  03/20/2005  T:  03/21/2005  Job:  [id number]

## 2010-09-20 NOTE — Op Note (Signed)
Yvonne Miller, Yvonne Miller NO.:  [id number]   MEDICAL RECORD NO.:  [id number]          PATIENT TYPE:  OIB   LOCATION:  2899                         FACILITY:  [facility]   PHYSICIAN:  Lind Guest. Ninfa Linden, M.D.DATE OF BIRTH:  Dec 03, 1950   DATE OF PROCEDURE:  09/13/2004  DATE OF DISCHARGE:  09/13/2004                                 OPERATIVE REPORT   PREOPERATIVE DIAGNOSIS:  Right knee degenerative joint disease with internal  derangement.   POSTOPERATIVE DIAGNOSES:  1.  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  2.  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   PROCEDURES:  1.  Right knee arthroscopy with debridement of articular cartilage.  2.  Partial medial and lateral meniscectomies.   SURGEON:  Lind Guest. Ninfa Linden, M.D.   ANESTHESIA:  General.   COMPLICATIONS:  None.   TOURNIQUET TIME:  One hour and 18 minutes.   INDICATIONS:  Briefly, Yvonne Miller is a pleasant 60 year old female with a  long history of right knee pain.  She had had surgery on this knee well over  10-15 years ago.  She is not sure why this was.  She had a medial  parapatellar incision and likely had a medial meniscal repair.  There were  incomplete records on her and the surgery was done elsewhere.  I did attempt  to find the records of her surgery, but there were no complete.  She had  continued to experience knee pain with fullness, especially in the popliteal  fossa.  She had had locking and catching of the knee.  Conservative therapy  of steroids, NSAIDs and physical therapy had failed.  She was seeking  surgical intervention given the pain and the mechanical symptoms she was  having and wanted to proceed with arthroscopy.  It has been recommended that  she would likely benefit from total knee arthroplasty, but she wanted  certainly to have a more minimal approach given her cardiac and  diabetic  proceed.  I agreed to proceed with arthroscopy.   DESCRIPTION OF PROCEDURE:  After informed consent was obtained, Yvonne Miller  was brought to the operating room and placed supine on the operating table.  General anesthesia was then obtained.  A nonsterile tourniquet was placed  around her upper thigh.  The leg was then prepped with DuraPrep and sterile  drapes.  The prep was carried down to the ankle and then a sterile  stockinette was placed.  First attention was turned to a portal for the  diagnostic arthroscopic portion of the case.  A portal was made just lateral  to the patellar tendon at the level of the inferior pole of the patella.  This was made sharply with the knife and then a trocar was inserted followed  by the arthroscope.  A diagnostic arthroscopy was undertaken of the knee.  There was found to be significant chondromalacia, at least grade 4,  involving the undersurface of the patella and the trochlea, as well as the  medial femoral condyle of  the femur.  There were also degenerative tears  noted in the posterior aspect of the medial meniscus, as well as posterior  and posterolateral of the lateral meniscus.  The intercondylar notch area  just had some mild degenerative changes and the ACL was found to be intact.  There was a large osteophyte anteriorly coming from near the lateral horn of  the meniscus.  This was causing some mechanical clicking as well when I  brought the knee through a full range of motion under arthroscopic guidance.  Next an anteromedial portal was made at the inferior pole of the patella  just medial to the patellar tendon.  This was carried sharply with the knee  and the trocar was inserted followed by the arthroscopic shaver.  A  debridement was then carried out of loose cartilage edges of the medial  femoral condyle and the trochlea, as well as partial meniscectomies of the  posterior horn of the medial meniscus.  As well, there was a radial  tear  noted on this area and also a partial mediolateral meniscectomy was carried  out.  This was an attempt to decompress the Baker's cyst as well.  Next a  high-speed bur was inserted and osteophyte was removed from the anterior  most aspect of the knee near the insertion site of the lateral meniscus.  Once this was accomplished, the shaver was removed.  The knee again was put  through a range of motion and found to be stable.  The scope was then  removed and a mixture of morphine and Marcaine was inserted into the knee.  The arthroscopic portal sites were closed with interrupted 4-0 nylon sutures  and a well-padded sterile dressing was applied followed by an ice pack.  The  patient was then awakened, extubated and taken to the recovery room in  stable condition.      CYB/MEDQ  D:  09/13/2004  T:  09/15/2004  Job:  [id number]

## 2010-09-20 NOTE — Procedures (Signed)
[place]  Patient:    Yvonne Miller, Yvonne Miller                      MRN: [id number] Proc. Date: 07/15/00 Attending:  Nelwyn Salisbury, M.D. CC:         Bryon Lions, M.D.   Procedure Report  DATE OF BIRTH:  1951/01/01  REFERRING PHYSICIAN:   Bryon Lions, M.D.  PROCEDURE PERFORMED:  Colonoscopy.  ENDOSCOPIST:  Nelwyn Salisbury, M.D.  INSTRUMENT USED:  Olympus video colonoscope.  INDICATIONS FOR PROCEDURE:  Family history of colon cancer in a 60 year old African-American female rule out colonic polyps, masses, hemorrhoids etc.  PREPROCEDURE PREPARATION:  Informed consent was procured from the patient. The patient was fasted for eight hours prior to the procedure and prepped with a bottle of magnesium citrate and a gallon of NuLytely the night prior to the procedure.  PREPROCEDURE PHYSICAL:  The patient had stable vital signs.  Neck supple. Chest clear to auscultation.  S1, S2 regular.  Abdomen soft with normal abdominal bowel sounds.  DESCRIPTION OF PROCEDURE:  The patient was placed in the left lateral decubitus position and sedated with Demerol and Versed.  Once the patient was adequately sedated and maintained on low-flow oxygen and continuous cardiac monitoring, the Olympus video colonoscope was advanced from the rectum to the cecum without difficulty.  No masses, polyps, erosions, ulcerations or diverticula were seen.  The patient had small internal hemorrhoids on retroflexion in the rectum and tolerated the procedure well without complications.  IMPRESSION: 1. Normal colonoscopy up to the cecum. 2. Small nonbleeding internal hemorrhoids.  RECOMMENDATIONS: 1. The patient has been advised to increase the fluid and fiber in the diet. 2. Repeat colorectal cancer screening is recommended in the next five years or    earlier if need be.DD:  07/14/00 TD:  07/16/00 Job: [id number]

## 2010-09-20 NOTE — Op Note (Signed)
[name], FAULCONER NO.:  [id number]   MEDICAL RECORD NO.:  [id number]          PATIENT TYPE:  OIB   LOCATION:  2899                         FACILITY:  [facility]   PHYSICIAN:  Lind Guest. Ninfa Linden, M.D.DATE OF BIRTH:  1951-04-11   DATE OF PROCEDURE:  05/03/2004  DATE OF DISCHARGE:                                 OPERATIVE REPORT   PREOPERATIVE DIAGNOSIS:  Left knee internal derangement.   POSTOPERATIVE DIAGNOSIS:  Left knee internal derangement.   OPERATION PERFORMED:  Left knee arthroscopy with debridement.   FINDINGS:  1.  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  2.  Medial plica.   SURGEON:  Lind Guest. Ninfa Linden, M.D.   ANESTHESIA:  General.   ESTIMATED BLOOD LOSS:  Minimal.   TOURNIQUET TIME:  30 minutes.   INDICATIONS FOR PROCEDURE:  Briefly, Yvonne Miller is a 60 year old obese female  with a history of left knee pain, locking and catching.  She had a previous  arthroscopy several years ago by another surgeon who did a partial medial  meniscectomy.  She continued to have locking and catching in her knee over  the last several months as well as fusion.  Given her symptoms, we elected  to proceed to the operating room.   DESCRIPTION OF PROCEDURE:  After informed consent was obtained.  She was  brought to the operating room and placed supine on the operating table.  After general anesthesia was obtained, a nonsterile tourniquet was placed  around her left thigh.  Her left leg was then prepped and draped with  DuraPrep and sterile drapes.  The knee was put in a flexed position off the  table and an anterior inferior lateral portal was made just lateral to the  patellar tendon in the soft spot of the knee.  The arthroscope was inserted  in the knee and a diagnostic drawer was taken of the knee and the above-  mentioned findings were shown.  Next, a  medial portal at the same level just  medial to the patellar tendon was made and the arthroscopic shaver was  inserted in this portal.  A large cartilage flap was debrided from the  medial  femoral condyle.  There were noted signs of her previous medial  meniscectomy.  There was abundant scar tissue noted as well as a medial  plica which was debrided as well with the shaver.  The anterior cruciate  ligament was found to be intact and the lateral compartment only showed  slight fraying of the lateral meniscus.  There was abundant cartilage  deficit underneath the patella but the trochlear groove for the most part  appeared to be intact.  The knee was lavaged with fluid and the scope and  shaver were removed.  An injection of 0.25% Marcaine  and morphine was inserted into the knee joint.  The tourniquet was inflated  during the case at 300 mm of pressure for only 30 minutes.  After the  dressings were applied, the  tourniquet was let down and the toes did pinken  nicely.  Of note, the portal sites were closed with interrupted 4-0 nylon  suture.       CYB/MEDQ  D:  05/03/2004  T:  05/03/2004  Job:  [id number]

## 2010-09-20 NOTE — Op Note (Signed)
[name], [name]                ACCOUNT NO.:  [id number]   MEDICAL RECORD NO.:  [id number]          PATIENT TYPE:  INP   LOCATION:  3010                         FACILITY:  [facility]   PHYSICIAN:  Ashok Pall, M.D.     DATE OF BIRTH:  01-22-1951   DATE OF PROCEDURE:  DATE OF DISCHARGE:  05/25/2004                                 OPERATIVE REPORT   DATE OF PROCEDURE:  May 24, 2004.   PREOPERATIVE DIAGNOSES:  1.  Cervical spondylosis, C4-5, C5-6.  2.  Cervical stenosis, C4-5, C5-6.  3.  Cervical radiculopathy.   PROCEDURE:  1.  Anterior cervical decompression, C4-5, C5-6.  2.  Anterior arthrodesis, C4 to C6, with 6 mm lordotic Synthes graft at C4-      5, 7 mm allograft at C5-6, with DBX bone putty.  3.  Anterior instrumentation, Synthes plate ACCS, 31 mm, with six screws.   COMPLICATIONS:  None.   SURGEON:  Ashok Pall, MD.   ASSISTANT:  Hosie Spangle, MD.   INDICATIONS:  Yvonne Miller is a 60 year old, who presented with cervical  stenosis and spondylitic change.  That was at C4-5 and C5-6.  She had cord  compression at that level.  I recommended and she agreed to undergo  operative decompression.   DESCRIPTION OF PROCEDURE:  Yvonne Miller was brought to the operating room,  intubated, and placed under a general anesthetic without difficulty.  She  actually had use of the difficult airway cart for intubation, but it was  done without difficulty.  Her neck was prepped, and she was draped in a  sterile fashion.  Ten pounds of traction were applied via a chin strap.  She  was prepped and draped, and I infiltrated 6 mL half-percent lidocaine with  1:200,000 strength epinephrine.  I opened the skin starting at the midline  and extending to the medial border of the left sternocleidomastoid.  I made  the first skin incision down through the platysma.  I opened that in a  horizontal fashion.  I used bipolar cautery to control bleeding points in  the subcutaneous tissue.  I  then created an avascular plane to the anterior  cervical spine until I was able to visualize the spine itself.  I placed a  spinal needle and could not tell from the first x-ray where we were, so I  placed another needle and I was indeed at C4-5 and C5-6.  I then reflected  along the longus coli muscles bilaterally.  I placed a self-retaining  retractor and two distraction pins, one at C5 and one at C6.  I opened the  disk space with a #15 blade.  I then distracted the disk space in a  progressive fashion using pituitary rongeurs, Kerrison punches, and a high-  speed drill.  I removed the disk material.  I then was able to fully  decompress both of the C6 nerve roots bilaterally.  After decompressing the  nerve roots, I then placed a 7 mm bone graft filled with DBX putty in the  center.  I then removed the distraction  pins at C6 and at C5.  I removed my  retractors up one level and then placed a distraction pin back in C5 and  then placed a distraction pin in C4.  I opened the disk space with a #15  blade and then distracted the disk space.  Again, in a progressive fashion,  I removed the disk material until I had encountered the posterior  longitudinal ligament.  I then used a high-speed drill to drill off  osteophytes, which were pronounced actually at both levels.  Using a  Kerrison punch, I was able to undercut both vertebra and decompress the  canal at that level.  I then, in the lateral gutters, was able to decompress  the C5 nerve roots.  At both levels, I did undercut using the Kerrison  punches, though I did not state that initially at C5-6.  After a thorough  decompression, I then placed a 6 mm lordotic graft as patient had had a  preoperative kyphosis at C4-5.  I placed that without difficulty with Dr.  Donnella Bi assistance.  We then turned our attention to the plate.  After  finding the correct size, we then placed the plate using both self-drilling  and self-tapping screws,  two screws in C4, two screws in C5, and two screws  in C6.  The plate and plugs appeared to be in good position.  I then  irrigated the wound.  I then closed the wound in a layered fashion using  Vicryl sutures.  Dermabond was used for a sterile dressing.  The patient  tolerated the procedure well.       KC/MEDQ  D:  05/24/2004  T:  05/25/2004  Job:  [id number]

## 2011-03-04 ENCOUNTER — Ambulatory Visit (HOSPITAL_COMMUNITY)
Admission: RE | Admit: 2011-03-04 | Discharge: 2011-03-04 | Disposition: A | Discharge status: Home / Self Care | Payer: Medicare Other | Source: Ambulatory Visit | Attending: Cardiology | Admitting: Cardiology

## 2011-03-04 DIAGNOSIS — I428 Other cardiomyopathies: Secondary | ICD-10-CM | POA: Insufficient documentation

## 2011-03-04 DIAGNOSIS — I517 Cardiomegaly: Secondary | ICD-10-CM | POA: Insufficient documentation

## 2011-03-04 DIAGNOSIS — I2789 Other specified pulmonary heart diseases: Secondary | ICD-10-CM | POA: Insufficient documentation

## 2011-03-04 DIAGNOSIS — R0602 Shortness of breath: Secondary | ICD-10-CM | POA: Insufficient documentation

## 2011-03-04 LAB — POCT I-STAT 3, ART BLOOD GAS (G3+)
Acid-base deficit: 1 mmol/L (ref 0.0–2.0)
Bicarbonate: 24.7 mEq/L — ABNORMAL HIGH (ref 20.0–24.0)
O2 Saturation: 95 %
TCO2: 26 mmol/L (ref 0–100)
pCO2 arterial: 44.5 mmHg (ref 35.0–45.0)
pH, Arterial: 7.352 (ref 7.350–7.400)
pO2, Arterial: 80 mmHg (ref 80.0–100.0)

## 2011-03-04 LAB — POCT I-STAT 3, VENOUS BLOOD GAS (G3P V)
Bicarbonate: 25.7 mEq/L — ABNORMAL HIGH (ref 20.0–24.0)
Bicarbonate: 25.9 mEq/L — ABNORMAL HIGH (ref 20.0–24.0)
O2 Saturation: 70 %
O2 Saturation: 75 %
TCO2: 27 mmol/L (ref 0–100)
TCO2: 27 mmol/L (ref 0–100)
pCO2, Ven: 45.6 mmHg (ref 45.0–50.0)
pCO2, Ven: 45.7 mmHg (ref 45.0–50.0)
pH, Ven: 7.359 — ABNORMAL HIGH (ref 7.250–7.300)
pH, Ven: 7.361 — ABNORMAL HIGH (ref 7.250–7.300)
pO2, Ven: 38 mmHg (ref 30.0–45.0)
pO2, Ven: 42 mmHg (ref 30.0–45.0)

## 2011-03-04 LAB — POCT ACTIVATED CLOTTING TIME
Activated Clotting Time: 215 seconds
Activated Clotting Time: 155 seconds

## 2011-03-04 LAB — GLUCOSE, CAPILLARY
Glucose-Capillary: 140 mg/dL — ABNORMAL HIGH (ref 70–99)
Glucose-Capillary: 115 mg/dL — ABNORMAL HIGH (ref 70–99)

## 2011-03-04 NOTE — Cardiovascular Report (Signed)
NAMEJINEEN, MAGRUDER NO.:  [id number]  MEDICAL RECORD NO.:  [id number]  LOCATION:  [facility]                         FACILITY:  [facility]  PHYSICIAN:  Laverda Page, MD DATE OF BIRTH:  11/19/1950  DATE OF PROCEDURE:  03/04/2011 DATE OF DISCHARGE:                           CARDIAC CATHETERIZATION   PROCEDURE PERFORMED: 1. Right heart catheterization and calculation of cardiac output and     cardiac index by Fick. 2. Left heart catheterization including left ventriculography and     selective right and left coronary arteriography.  SURGEON:  Laverda Page, MD  INDICATION:  [name] is a 60 year old female with history of hypertension, diabetes, hyperlipidemia, who was been complaining of shortness of breath and dyspnea on exertion.  She underwent outpatient stress testing, which had revealed severe LV systolic dysfunction with severe mid-to-distal inferior ischemia, severe septal ischemia with ejection fraction of 29%.  Echocardiogram revealed an ejection fraction of 37%.  Given abnormal stress test and multiple cardiovascular risk factors, she is now brought to the cardiac cath lab to evaluate her coronary anatomy.  HEMODYNAMIC DATA/RIGHT HEART CATHETERIZATION:  RA pressure 13/9 with a mean of 9 mmHg.  SVC saturation of 75%.  RV pressure was [id number] with end-diastolic pressure of 15 mmHg.  PA pressure was 42/19 with a mean of 29 mmHg.  PA saturation of 70%.  Pulmonary capillary wedge pressure was 19/27 with a mean of 19 mmHg.  Cardiac output by Fick was 8.32 with a cardiac index of 3.73.  HEMODYNAMIC DATA/LEFT HEART CATHETERIZATION:  Left ventricular pressure was [id number] with end-diastolic pressure of 15 mmHg.  Aortic pressure was 120/75 with a mean of 94 mmHg.  There was no pressure gradient across the aortic valve.  ANGIOGRAPHIC DATA:  Left ventricle:  The left ventricle systolic function was mildly depressed with ejection fraction of  around 45% with global hypokinesis.  There was marked left ventricular hypertrophy. There was no significant mitral regurgitation.  Right coronary artery:  The right coronary artery is a dominant vessel. Smooth and is normal.  Left main:  The left main coronary artery is very short and bifurcates.  LAD:  The LAD is large caliber vessel with very mild luminal irregularity.  It wraps around the apex.  Circumflex coronary artery:  Circumflex coronary artery is a large caliber vessel giving origin to a large obtuse marginal 1, which bifurcates and continues as a large obtuse marginal 2 and then in the AV groove.  It has got mild luminal irregularity.  IMPRESSION: 1. 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 2. Marked left ventricular hypertrophy. 3. Mild luminal irregularity of the coronary arteries without any     significant stenosis. 4. Moderate pulmonary hypertension with the elevated cardiac output     and cardiac index due to morbid obesity. 5. Elevated left ventricular end-diastolic pressure secondary to     marked left ventricular hypertrophy with morbid obesity.  RECOMMENDATION:  Continued primary prevention strategy is indicated. She will need weight loss and lifestyle changes to be done.  She will be discharged home today if she remains stable.  TECHNIQUE OF PROCEDURE:  Under sterile precautions using right antecubital vein access, right heart catheterization was performed using a 5-French sheath.  A 5-French balloon-tipped catheter was advanced into the pulmonary capillary wedge position without any complications.  The hemodynamics were carefully performed and data was carefully analyzed. Oxygen saturations were also obtained.  The catheter then pulled out of the body in the usual fashion at the end of the procedure.  Left heart catheterization was performed using a 6-French  right radial arterial access.  A 6-French sheath TIG-4 catheter was advanced into the ascending aorta, then into the left ventricle.  Initially, I performed left ventriculography with a hand contrast injection but because of the inability to fill the left ventricular cavity, I opted to proceed with selective left and right coronary arteriography followed by catheter exchanged over a safety J-wire and introduction of a 5-French pigtail catheter into the left ventricle and left ventriculography was performed in the RAO projection.  Catheter was against pulled out of body over a J- wire.  The patient tolerated the procedure well.  The patient's hemostasis was obtained by applying TR band and manual pressure was held at the venous access.  No immediate complication noted.     Laverda Page, MD     JRG/MEDQ  D:  03/04/2011  T:  03/04/2011  Job:  [id number]  cc:   Theda Belfast. Baird Cancer, M.D.  Electronically Signed by Adrian Prows MD on 03/04/2011 08:03:58 PM

## 2011-04-10 ENCOUNTER — Ambulatory Visit
Admission: RE | Admit: 2011-04-10 | Discharge: 2011-04-10 | Disposition: A | Discharge status: Home / Self Care | Payer: Medicare Other | Source: Ambulatory Visit | Attending: Nurse Practitioner | Admitting: Nurse Practitioner

## 2011-04-10 ENCOUNTER — Other Ambulatory Visit: Payer: Self-pay | Admitting: Nurse Practitioner

## 2011-04-10 DIAGNOSIS — R0602 Shortness of breath: Secondary | ICD-10-CM

## 2011-09-23 ENCOUNTER — Emergency Department (HOSPITAL_BASED_OUTPATIENT_CLINIC_OR_DEPARTMENT_OTHER)
Admission: EM | Admit: 2011-09-23 | Discharge: 2011-09-23 | Disposition: A | Discharge status: Home / Self Care | Payer: Medicare Other | Attending: Emergency Medicine | Admitting: Emergency Medicine

## 2011-09-23 ENCOUNTER — Emergency Department (HOSPITAL_BASED_OUTPATIENT_CLINIC_OR_DEPARTMENT_OTHER): Payer: Medicare Other

## 2011-09-23 ENCOUNTER — Encounter (HOSPITAL_BASED_OUTPATIENT_CLINIC_OR_DEPARTMENT_OTHER): Payer: Self-pay | Admitting: *Deleted

## 2011-09-23 DIAGNOSIS — E119 Type 2 diabetes mellitus without complications: Secondary | ICD-10-CM | POA: Insufficient documentation

## 2011-09-23 DIAGNOSIS — X19XXXA Contact with other heat and hot substances, initial encounter: Secondary | ICD-10-CM | POA: Insufficient documentation

## 2011-09-23 DIAGNOSIS — E785 Hyperlipidemia, unspecified: Secondary | ICD-10-CM | POA: Insufficient documentation

## 2011-09-23 DIAGNOSIS — R059 Cough, unspecified: Secondary | ICD-10-CM

## 2011-09-23 DIAGNOSIS — J069 Acute upper respiratory infection, unspecified: Secondary | ICD-10-CM

## 2011-09-23 DIAGNOSIS — I10 Essential (primary) hypertension: Secondary | ICD-10-CM | POA: Insufficient documentation

## 2011-09-23 DIAGNOSIS — R05 Cough: Secondary | ICD-10-CM

## 2011-09-23 DIAGNOSIS — T22019A Burn of unspecified degree of unspecified forearm, initial encounter: Secondary | ICD-10-CM

## 2011-09-23 HISTORY — DX: Hyperlipidemia, unspecified: E78.5

## 2011-09-23 HISTORY — DX: Essential (primary) hypertension: I10

## 2011-09-23 MED ORDER — SILVER SULFADIAZINE 1 % EX CREA
TOPICAL_CREAM | Freq: Once | CUTANEOUS | Status: AC
  Administered 2011-09-23: 16:00:00 via TOPICAL
  Filled 2011-09-23: qty 85

## 2011-09-23 MED ORDER — PROMETHAZINE-CODEINE 6.25-10 MG/5ML PO SYRP: 5.0000 mL | ORAL_SOLUTION | Freq: Four times a day (QID) | ORAL | Status: AC | PRN

## 2011-09-23 NOTE — Discharge Instructions (Signed)
Cough, Adult  A cough is a reflex. It helps you clear your throat and airways. A cough can help heal your body. A cough can last 2 or 3 weeks (acute) or may last more than 8 weeks (chronic). Some common causes of a cough can include an infection, allergy, or a cold. HOME CARE  Only take medicine as told by your doctor.   If given, take your medicines (antibiotics) as told. Finish them even if you start to feel better.   Use a cold steam vaporizer or humidier in your home. This can help loosen thick spit (secretions).   Sleep so you are almost sitting up (semi-upright). Use pillows to do this. This helps reduce coughing.   Rest as needed.   Stop smoking if you smoke.  GET HELP RIGHT AWAY IF:  You have yellowish-white fluid (pus) in your thick spit.   Your cough gets worse.   Your medicine does not reduce coughing, and you are losing sleep.   You cough up blood.   You have trouble breathing.   Your pain gets worse and medicine does not help.   You have a fever.  MAKE SURE YOU:   Understand these instructions.   Will watch your condition.   Will get help right away if you are not doing well or get worse.  Document Released: 01/02/2011 Document Revised: 04/10/2011 Document Reviewed: 01/02/2011 [facility] Patient Information 2012 [place].
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
## 2011-09-24 NOTE — ED Provider Notes (Signed)
Medical screening examination/treatment/procedure(s) were performed by non-physician practitioner and as supervising physician I was immediately available for consultation/collaboration.   Julianne Rice, MD 09/24/11 1505

## 2012-06-13 ENCOUNTER — Emergency Department (HOSPITAL_BASED_OUTPATIENT_CLINIC_OR_DEPARTMENT_OTHER)
Admission: EM | Admit: 2012-06-13 | Discharge: 2012-06-13 | Disposition: A | Discharge status: Home / Self Care | Payer: Medicare Other | Attending: Emergency Medicine | Admitting: Emergency Medicine

## 2012-06-13 ENCOUNTER — Emergency Department (HOSPITAL_BASED_OUTPATIENT_CLINIC_OR_DEPARTMENT_OTHER): Payer: Medicare Other

## 2012-06-13 ENCOUNTER — Encounter (HOSPITAL_BASED_OUTPATIENT_CLINIC_OR_DEPARTMENT_OTHER): Payer: Self-pay | Admitting: *Deleted

## 2012-06-13 DIAGNOSIS — J45909 Unspecified asthma, uncomplicated: Secondary | ICD-10-CM | POA: Insufficient documentation

## 2012-06-13 DIAGNOSIS — I10 Essential (primary) hypertension: Secondary | ICD-10-CM | POA: Insufficient documentation

## 2012-06-13 DIAGNOSIS — Z7982 Long term (current) use of aspirin: Secondary | ICD-10-CM | POA: Insufficient documentation

## 2012-06-13 DIAGNOSIS — E785 Hyperlipidemia, unspecified: Secondary | ICD-10-CM | POA: Insufficient documentation

## 2012-06-13 DIAGNOSIS — I890 Lymphedema, not elsewhere classified: Secondary | ICD-10-CM | POA: Insufficient documentation

## 2012-06-13 DIAGNOSIS — R079 Chest pain, unspecified: Secondary | ICD-10-CM | POA: Insufficient documentation

## 2012-06-13 DIAGNOSIS — Z794 Long term (current) use of insulin: Secondary | ICD-10-CM | POA: Insufficient documentation

## 2012-06-13 DIAGNOSIS — Z79899 Other long term (current) drug therapy: Secondary | ICD-10-CM | POA: Insufficient documentation

## 2012-06-13 DIAGNOSIS — E119 Type 2 diabetes mellitus without complications: Secondary | ICD-10-CM | POA: Insufficient documentation

## 2012-06-13 LAB — COMPREHENSIVE METABOLIC PANEL WITH GFR
ALT: 14 U/L (ref 0–35)
AST: 14 U/L (ref 0–37)
Albumin: 3.8 g/dL (ref 3.5–5.2)
Alkaline Phosphatase: 101 U/L (ref 39–117)
BUN: 11 mg/dL (ref 6–23)
CO2: 25 meq/L (ref 19–32)
Calcium: 9.2 mg/dL (ref 8.4–10.5)
Chloride: 103 meq/L (ref 96–112)
Creatinine, Ser: 0.7 mg/dL (ref 0.50–1.10)
GFR calc Af Amer: >90 mL/min
GFR calc non Af Amer: >90 mL/min
Glucose, Bld: 170 mg/dL — ABNORMAL HIGH (ref 70–99)
Potassium: 4.1 meq/L (ref 3.5–5.1)
Sodium: 139 meq/L (ref 135–145)
Total Bilirubin: 0.3 mg/dL (ref 0.3–1.2)
Total Protein: 7.6 g/dL (ref 6.0–8.3)

## 2012-06-13 LAB — APTT: aPTT: 29 s (ref 24–37)

## 2012-06-13 LAB — CBC WITH DIFFERENTIAL/PLATELET
Basophils Absolute: 0 10*3/uL (ref 0.0–0.1)
Basophils Relative: 0 % (ref 0–1)
Eosinophils Absolute: 0.3 10*3/uL (ref 0.0–0.7)
Eosinophils Relative: 3 % (ref 0–5)
HCT: 38.5 % (ref 36.0–46.0)
Hemoglobin: 12.8 g/dL (ref 12.0–15.0)
Lymphocytes Relative: 33 % (ref 12–46)
Lymphs Abs: 3.1 10*3/uL (ref 0.7–4.0)
MCH: 27.1 pg (ref 26.0–34.0)
MCHC: 33.2 g/dL (ref 30.0–36.0)
MCV: 81.6 fL (ref 78.0–100.0)
Monocytes Absolute: 0.5 10*3/uL (ref 0.1–1.0)
Monocytes Relative: 5 % (ref 3–12)
Neutro Abs: 5.5 10*3/uL (ref 1.7–7.7)
Neutrophils Relative %: 59 % (ref 43–77)
Platelets: 322 10*3/uL (ref 150–400)
RBC: 4.72 MIL/uL (ref 3.87–5.11)
RDW: 14.2 % (ref 11.5–15.5)
WBC: 9.3 10*3/uL (ref 4.0–10.5)

## 2012-06-13 LAB — URINALYSIS, ROUTINE W REFLEX MICROSCOPIC
Bilirubin Urine: NEGATIVE
Glucose, UA: NEGATIVE mg/dL
Hgb urine dipstick: NEGATIVE
Ketones, ur: NEGATIVE mg/dL
Leukocytes, UA: NEGATIVE
Nitrite: NEGATIVE
Protein, ur: NEGATIVE mg/dL
Specific Gravity, Urine: 1.007 (ref 1.005–1.030)
Urobilinogen, UA: 0.2 mg/dL (ref 0.0–1.0)
pH: 6.5 (ref 5.0–8.0)

## 2012-06-13 LAB — D-DIMER, QUANTITATIVE: D-Dimer, Quant: <0.27 ug{FEU}/mL (ref 0.00–0.48)

## 2012-06-13 LAB — PROTIME-INR
INR: 0.93 (ref 0.00–1.49)
Prothrombin Time: 12.4 seconds (ref 11.6–15.2)

## 2012-06-13 NOTE — ED Provider Notes (Signed)
History     CSN: [id number]  Arrival date & time 06/13/12  [id number]   First MD Initiated Contact with Patient 06/13/12 [phone]      Chief Complaint  Patient presents with  . Left Leg Pain     (Consider location/radiation/quality/duration/timing/severity/associated sxs/prior treatment) Patient is a 62 y.o. female presenting with leg pain. The history is provided by the patient. No language interpreter was used.  Leg Pain Location:  Leg Time since incident:  2 days Injury: no   Leg location:  L lower leg Pain details:    Quality:  Throbbing   Radiates to:  Does not radiate   Severity:  Moderate   Onset quality:  Gradual   Duration:  2 days   Timing:  Intermittent   Progression:  Worsening Chronicity:  New Dislocation: no   Foreign body present:  No foreign bodies Prior injury to area:  No Relieved by:  Nothing Worsened by:  Nothing tried Ineffective treatments:  None tried Associated symptoms: swelling   Associated symptoms: no fever   Risk factors comment:  She has lymphedema of her left leg. She is concerned about the possibility of a "blood clot."   Past Medical History  Diagnosis Date  . Diabetes mellitus   . Asthma   . Hypertension   . Hyperlipemia     Past Surgical History  Procedure Laterality Date  . Abdominal hysterectomy    . Joint replacement    . Back surgery    . Breast surgery      History reviewed. No pertinent family history.  History  Substance Use Topics  . Smoking status: Never Smoker   . Smokeless tobacco: Not on file  . Alcohol Use: No    OB History   Grav Para Term Preterm Abortions TAB SAB Ect Mult Living                  Review of Systems  Constitutional: Negative.  Negative for fever and chills.  HENT: Negative.   Respiratory: Negative.  Negative for chest tightness and shortness of breath.   Cardiovascular: Positive for chest pain.  Gastrointestinal: Negative.   Endocrine: Negative.   Genitourinary: Negative.    Musculoskeletal:       Left calf pain.  Skin: Negative.   Allergic/Immunologic: Negative.   Neurological: Negative.   Psychiatric/Behavioral: Negative.     Allergies  Review of patient's allergies indicates no known allergies.  Home Medications   Current Outpatient Rx  Name  Route  Sig  Dispense  Refill  . furosemide (LASIX) 40 MG tablet   Oral   Take 20 mg by mouth daily.         . rosuvastatin (CRESTOR) 20 MG tablet   Oral   Take 20 mg by mouth daily.         . saxagliptin HCl (ONGLYZA) 2.5 MG TABS tablet   Oral   Take 5 mg by mouth daily.         [name] aspirin 81 MG tablet   Oral   Take 81 mg by mouth daily.         . carvedilol (COREG) 25 MG tablet   Oral   Take 25 mg by mouth 2 (two) times daily with a meal.         . furosemide (LASIX) 20 MG tablet   Oral   Take 20 mg by mouth 2 (two) times daily.         . insulin detemir (LEVEMIR  FLEXPEN) 100 UNIT/ML injection   Subcutaneous   Inject 55 Units into the skin at bedtime.         . insulin lispro protamine-insulin lispro (HUMALOG 75/25) (75-25) 100 UNIT/ML SUSP   Subcutaneous   Inject 55 Units into the skin.         . metFORMIN (GLUCOPHAGE-XR) 750 MG 24 hr tablet   Oral   Take 750 mg by mouth daily with breakfast.         . Multiple Vitamin (MULTIVITAMIN) tablet   Oral   Take 1 tablet by mouth daily.         [name] olmesartan-hydrochlorothiazide (BENICAR HCT) 40-25 MG per tablet   Oral   Take 1 tablet by mouth daily.           BP 166/107  Pulse 78  Temp(Src) 97.8 F (36.6 C) (Oral)  Resp 23  Physical Exam  Nursing note and vitals reviewed. Constitutional:  Morbidly obese woman. Blood pressure 166/107. In no distress at rest.  HENT:  Head: Normocephalic and atraumatic.  Right Ear: External ear normal.  Left Ear: External ear normal.  Mouth/Throat: Oropharynx is clear and moist.  Eyes: Conjunctivae and EOM are normal. Pupils are equal, round, and reactive to light.  Neck:  Normal range of motion. Neck supple.  Cardiovascular: Normal rate, regular rhythm and normal heart sounds.   Pulmonary/Chest: Effort normal and breath sounds normal.  Abdominal: Soft. Bowel sounds are normal.  Musculoskeletal:  She has swelling of her left lower leg from the knee down. There is tenderness in the left calf, and edema in the left ankle and foot. The right leg has no comparable calf tenderness or swelling. She has intact pulses sensation and tendon function in both feet.  Skin: Skin is warm and dry.  Psychiatric: She has a normal mood and affect. Her behavior is normal.    ED Course  Procedures (including critical care time)  Labs Reviewed  CBC WITH DIFFERENTIAL  COMPREHENSIVE METABOLIC PANEL  URINALYSIS, ROUTINE W REFLEX MICROSCOPIC  PROTIME-INR  APTT  D-DIMER, QUANTITATIVE   7:29 AM Patient was seen and had physical examination. She has intermediate risk for deep venous thrombosis by Wells criteria, having clinical symptoms swelling of her left leg, and pitting edema in the left leg and foot. Laboratory tests including D. dimer were ordered. Patient did not request pain medicine at present.  8:38 AM CBC, UA, PT, PTT, INR all WNL.   Advised pt of these results.  Also, notified her that the ultrasonographer would be in at 10 A.M.  11:38 AM Venous ultrasound shows no DVT, but does show edema.  She has known lymphedema.  Pt advised of this result, reassured and released.  1. Lymphedema of left leg             Mylinda Latina III, MD 06/13/12 [phone]

## 2012-06-13 NOTE — ED Notes (Addendum)
C/o left lower leg pain that started on Friday but worse this morning. Hx of lymphedema to this leg. States left leg is more swollen than normal. States pain woke her this morning. Describes as a sharp pain that comes and goes. Left leg is swollen on exam. No warmth noted to leg. Pain worse with movement and in the left calf area.  Denies any fevers. States she had an extended flight in Jan,but nothing recently. Denies any cp/sob.  Denies any recent injury or fall

## 2013-02-19 ENCOUNTER — Encounter (HOSPITAL_BASED_OUTPATIENT_CLINIC_OR_DEPARTMENT_OTHER): Payer: Self-pay | Admitting: Emergency Medicine

## 2013-02-19 ENCOUNTER — Emergency Department (HOSPITAL_BASED_OUTPATIENT_CLINIC_OR_DEPARTMENT_OTHER)
Admission: EM | Admit: 2013-02-19 | Discharge: 2013-02-20 | Disposition: A | Discharge status: Home / Self Care | Payer: Medicare Other | Attending: Emergency Medicine | Admitting: Emergency Medicine

## 2013-02-19 DIAGNOSIS — Z794 Long term (current) use of insulin: Secondary | ICD-10-CM | POA: Insufficient documentation

## 2013-02-19 DIAGNOSIS — S80812A Abrasion, left lower leg, initial encounter: Secondary | ICD-10-CM

## 2013-02-19 DIAGNOSIS — J45909 Unspecified asthma, uncomplicated: Secondary | ICD-10-CM | POA: Insufficient documentation

## 2013-02-19 DIAGNOSIS — I890 Lymphedema, not elsewhere classified: Secondary | ICD-10-CM

## 2013-02-19 DIAGNOSIS — Y939 Activity, unspecified: Secondary | ICD-10-CM | POA: Insufficient documentation

## 2013-02-19 DIAGNOSIS — Z79899 Other long term (current) drug therapy: Secondary | ICD-10-CM | POA: Insufficient documentation

## 2013-02-19 DIAGNOSIS — Y929 Unspecified place or not applicable: Secondary | ICD-10-CM | POA: Insufficient documentation

## 2013-02-19 DIAGNOSIS — E785 Hyperlipidemia, unspecified: Secondary | ICD-10-CM | POA: Insufficient documentation

## 2013-02-19 DIAGNOSIS — W269XXA Contact with unspecified sharp object(s), initial encounter: Secondary | ICD-10-CM | POA: Insufficient documentation

## 2013-02-19 DIAGNOSIS — IMO0002 Reserved for concepts with insufficient information to code with codable children: Secondary | ICD-10-CM | POA: Insufficient documentation

## 2013-02-19 DIAGNOSIS — I10 Essential (primary) hypertension: Secondary | ICD-10-CM | POA: Insufficient documentation

## 2013-02-19 DIAGNOSIS — Z7982 Long term (current) use of aspirin: Secondary | ICD-10-CM | POA: Insufficient documentation

## 2013-02-19 DIAGNOSIS — E119 Type 2 diabetes mellitus without complications: Secondary | ICD-10-CM | POA: Insufficient documentation

## 2013-02-19 NOTE — ED Notes (Signed)
Pt is here for leaking and pain in her left leg since Thursday.  Pt has lymphadema and her left shin has been oozing since Thursday when she cut it (more of a scratch). Drainage is clear

## 2013-02-19 NOTE — ED Provider Notes (Signed)
CSN: [id number]     Arrival date & time 02/19/13  2222 History  This chart was scribed for [name], MD by Rolanda Lundborg, ED Scribe. This patient was seen in room MH06/MH06 and the patient's care was started at 11:44 PM.   Chief Complaint  Patient presents with  . Draining Wound    HPI HPI Comments: MAURIANA Miller is a 62 y.o. female with a h/o DM and lymphedema who presents to the Emergency Department complaining of clear drainage from the left leg out of a minor cut in her shin since two days ago. She has used neosporin on the wound. She reports the swelling is somewhat worse than baseline for the last two days. Her left leg has been larger than the right for years. She has been wearing compression stockings with some relief but the wound continues to ooze serous fluid. Pain at the wound is been minimal there is no erythema.  Past Medical History  Diagnosis Date  . Diabetes mellitus   . Asthma   . Hypertension   . Hyperlipemia    Past Surgical History  Procedure Laterality Date  . Abdominal hysterectomy    . Joint replacement    . Back surgery    . Breast surgery     No family history on file. History  Substance Use Topics  . Smoking status: Never Smoker   . Smokeless tobacco: Not on file  . Alcohol Use: No   OB History   Grav Para Term Preterm Abortions TAB SAB Ect Mult Living                 Review of Systems A complete 10 system review of systems was obtained and all systems are negative except as noted in the HPI and PMH.   Allergies  Review of patient's allergies indicates no known allergies.  Home Medications   Current Outpatient Rx  Name  Route  Sig  Dispense  Refill  . aspirin 81 MG tablet   Oral   Take 81 mg by mouth daily.         . carvedilol (COREG) 25 MG tablet   Oral   Take 25 mg by mouth 2 (two) times daily with a meal.         . furosemide (LASIX) 20 MG tablet   Oral   Take 20 mg by mouth 2 (two) times daily.         .  furosemide (LASIX) 40 MG tablet   Oral   Take 20 mg by mouth daily.         . insulin detemir (LEVEMIR FLEXPEN) 100 UNIT/ML injection   Subcutaneous   Inject 55 Units into the skin at bedtime.         . insulin lispro protamine-insulin lispro (HUMALOG 75/25) (75-25) 100 UNIT/ML SUSP   Subcutaneous   Inject 55 Units into the skin.         . metFORMIN (GLUCOPHAGE-XR) 750 MG 24 hr tablet   Oral   Take 750 mg by mouth daily with breakfast.         . Multiple Vitamin (MULTIVITAMIN) tablet   Oral   Take 1 tablet by mouth daily.         [name] olmesartan-hydrochlorothiazide (BENICAR HCT) 40-25 MG per tablet   Oral   Take 1 tablet by mouth daily.         . rosuvastatin (CRESTOR) 20 MG tablet   Oral   Take 20  mg by mouth daily.         . saxagliptin HCl (ONGLYZA) 2.5 MG TABS tablet   Oral   Take 5 mg by mouth daily.          BP 173/78  Pulse 70  Temp(Src) 98.1 F (36.7 C) (Oral)  Resp 18  SpO2 98% Physical Exam General: Well-developed, well-nourished female in no acute distress; appearance consistent with age of record HENT: normocephalic; atraumatic Eyes: pupils equal, round and reactive to light; extraocular muscles intact Neck: supple Heart: regular rate and rhythm Lungs: clear to auscultation bilaterally Abdomen: soft; nondistended; nontender; no masses or hepatosplenomegaly; bowel sounds present Extremities: No deformity; full range of motion; lymphedema of lower extremities left greater than right Neurologic: Awake, alert and oriented; motor function intact in all extremities and symmetric; no facial droop Skin: Warm and dry; superficial linear abrasion of left shin without erythema or tenderness with drainage of serous fluid Psychiatric: Normal mood and affect   ED Course  Procedures (including critical care time)  DIAGNOSTIC STUDIES: Oxygen Saturation is 98% on RA, normal by my interpretation.    COORDINATION OF CARE: 11:54 PM- Discussed treatment  plan with pt. Pt agrees to plan.     MDM   1. Abrasion of left lower leg, initial encounter   2. Lymphedema of lower extremity    I personally performed the services described in this documentation, which was scribed in my presence.  The recorded information has been reviewed and is accurate.    [name], MD 02/20/13 [phone]

## 2013-02-20 MED ORDER — MUPIROCIN CALCIUM 2 % EX CREA
TOPICAL_CREAM | Freq: Once | CUTANEOUS | Status: AC
  Administered 2013-02-20: 1 via TOPICAL
  Filled 2013-02-20: qty 15

## 2013-09-30 ENCOUNTER — Encounter: Payer: Self-pay | Admitting: *Deleted

## 2013-10-03 DIAGNOSIS — I447 Left bundle-branch block, unspecified: Secondary | ICD-10-CM | POA: Insufficient documentation

## 2013-10-03 DIAGNOSIS — E785 Hyperlipidemia, unspecified: Secondary | ICD-10-CM | POA: Insufficient documentation

## 2013-10-03 DIAGNOSIS — I429 Cardiomyopathy, unspecified: Secondary | ICD-10-CM | POA: Insufficient documentation

## 2013-10-03 DIAGNOSIS — E119 Type 2 diabetes mellitus without complications: Secondary | ICD-10-CM | POA: Insufficient documentation

## 2013-10-03 DIAGNOSIS — I10 Essential (primary) hypertension: Secondary | ICD-10-CM | POA: Insufficient documentation

## 2013-10-13 ENCOUNTER — Institutional Professional Consult (permissible substitution): Payer: Medicare Other | Admitting: Internal Medicine

## 2013-10-28 DIAGNOSIS — Z95810 Presence of automatic (implantable) cardiac defibrillator: Secondary | ICD-10-CM | POA: Insufficient documentation

## 2013-12-13 DIAGNOSIS — Z95810 Presence of automatic (implantable) cardiac defibrillator: Secondary | ICD-10-CM | POA: Insufficient documentation

## 2016-05-02 ENCOUNTER — Other Ambulatory Visit: Payer: Self-pay | Admitting: Pharmacist

## 2016-05-02 NOTE — Patient Outreach (Signed)
Outreach call to [employer] regarding medication adherence to pravastatin.  Called and spoke with patient. HIPAA identifiers verified and verbal consent received.  Yvonne Miller confirmed that she has enough pravastatin to last her. In addition, she received samples of Crestor from her PCP.  She also mentioned that she is receiving Januvia from the [employer] patient assistance program.  She said she has the paperwork but may need some assistance with it and with an Extra Help application.  An appointment will be made with [facility] Pharmacist, Karrie Meres, PharmD, BCACP.  Harlow Asa, PharmD, [facility] [phone]

## 2016-05-06 ENCOUNTER — Other Ambulatory Visit: Payer: Self-pay | Admitting: Pharmacist

## 2016-05-06 NOTE — Patient Outreach (Signed)
[facility] Care Management  05/06/2016  Yvonne Miller 05-15-1950 [id number]   Called [name] to complete an Extra Help Application. HIPPA compliant message left on her voicemail.   Plan: Will attempt to call the patient at a later date.  Phone call was made by [facility] CM PharmD Alwyn Ren, whom does not have CH[facility] access yet so I am assisting with her documentation.    Karrie Meres, PharmD, [place] [phone]

## 2016-05-07 ENCOUNTER — Other Ambulatory Visit: Payer: Self-pay | Admitting: Pharmacist

## 2016-05-07 NOTE — Patient Outreach (Signed)
Mrs. Barber was called twice today (10:17am and 1:45pm) regarding Extra Help application completion. A HIPPA compliant message was left on her voicemail.  I will continue to try to reach the patient later this week.  Alwyn Ren, PharmD, with [place] made outreach attempt.  She does not have access to [facility] so I am assisting her with her documentation.   Karrie Meres, PharmD, [place] [phone]

## 2016-05-08 ENCOUNTER — Other Ambulatory Visit: Payer: Self-pay | Admitting: Pharmacist

## 2016-05-08 NOTE — Patient Outreach (Signed)
[name] was called regarding completing an Extra Help Application. Unfortunately, I have been unable to reach her on several occasions.   A HIPPA compliant message was left on her voicemail.  Third unsuccessful attempt to reach patient.   Plan:  Outreach letter sent to patient---if no response in 10 business days, will close case.    Phone outreach was completed by [facility] CM Yvonne Miller, PharmD, whom does not yet have CHL access so I am assisting her with her documentation.    Yvonne Miller, PharmD, [place] [phone]

## 2016-05-09 ENCOUNTER — Encounter: Payer: Self-pay | Admitting: Pharmacist

## 2016-05-15 DIAGNOSIS — H40023 Open angle with borderline findings, high risk, bilateral: Secondary | ICD-10-CM | POA: Diagnosis not present

## 2016-05-20 DIAGNOSIS — E559 Vitamin D deficiency, unspecified: Secondary | ICD-10-CM | POA: Diagnosis not present

## 2016-05-20 DIAGNOSIS — Z0000 Encounter for general adult medical examination without abnormal findings: Secondary | ICD-10-CM | POA: Diagnosis not present

## 2016-05-20 DIAGNOSIS — I130 Hypertensive heart and chronic kidney disease with heart failure and stage 1 through stage 4 chronic kidney disease, or unspecified chronic kidney disease: Secondary | ICD-10-CM | POA: Diagnosis not present

## 2016-05-20 DIAGNOSIS — N08 Glomerular disorders in diseases classified elsewhere: Secondary | ICD-10-CM | POA: Diagnosis not present

## 2016-05-20 DIAGNOSIS — N183 Chronic kidney disease, stage 3 (moderate): Secondary | ICD-10-CM | POA: Diagnosis not present

## 2016-05-20 DIAGNOSIS — E1122 Type 2 diabetes mellitus with diabetic chronic kidney disease: Secondary | ICD-10-CM | POA: Diagnosis not present

## 2016-05-26 DIAGNOSIS — Z95810 Presence of automatic (implantable) cardiac defibrillator: Secondary | ICD-10-CM | POA: Diagnosis not present

## 2016-05-26 DIAGNOSIS — Z4502 Encounter for adjustment and management of automatic implantable cardiac defibrillator: Secondary | ICD-10-CM | POA: Diagnosis not present

## 2016-05-30 ENCOUNTER — Other Ambulatory Visit: Payer: Self-pay | Admitting: Pharmacist

## 2016-05-30 NOTE — Patient Outreach (Signed)
[facility]) Care Management  05/30/2016  Yvonne Miller 04-Sep-1950 [id number]  Three unsuccessful outreach attempts were made and an outreach letter was sent to patient with no response to assist patient with medication patient assistance.    Plan:  Will close case due to inability to maintain contact with patient.   Karrie Meres, PharmD, [place] [phone]

## 2016-07-01 DIAGNOSIS — I420 Dilated cardiomyopathy: Secondary | ICD-10-CM | POA: Diagnosis not present

## 2016-07-01 DIAGNOSIS — I447 Left bundle-branch block, unspecified: Secondary | ICD-10-CM | POA: Diagnosis not present

## 2016-07-01 DIAGNOSIS — R600 Localized edema: Secondary | ICD-10-CM | POA: Diagnosis not present

## 2016-07-01 DIAGNOSIS — Z95810 Presence of automatic (implantable) cardiac defibrillator: Secondary | ICD-10-CM | POA: Diagnosis not present

## 2016-07-01 DIAGNOSIS — Z4502 Encounter for adjustment and management of automatic implantable cardiac defibrillator: Secondary | ICD-10-CM | POA: Diagnosis not present

## 2016-07-09 DIAGNOSIS — I890 Lymphedema, not elsewhere classified: Secondary | ICD-10-CM | POA: Diagnosis not present

## 2016-07-09 DIAGNOSIS — J029 Acute pharyngitis, unspecified: Secondary | ICD-10-CM | POA: Diagnosis not present

## 2016-07-09 DIAGNOSIS — R202 Paresthesia of skin: Secondary | ICD-10-CM | POA: Diagnosis not present

## 2016-08-14 DIAGNOSIS — H25013 Cortical age-related cataract, bilateral: Secondary | ICD-10-CM | POA: Diagnosis not present

## 2016-08-14 DIAGNOSIS — H40023 Open angle with borderline findings, high risk, bilateral: Secondary | ICD-10-CM | POA: Diagnosis not present

## 2016-08-14 DIAGNOSIS — E119 Type 2 diabetes mellitus without complications: Secondary | ICD-10-CM | POA: Diagnosis not present

## 2016-08-26 DIAGNOSIS — E1122 Type 2 diabetes mellitus with diabetic chronic kidney disease: Secondary | ICD-10-CM | POA: Diagnosis not present

## 2016-08-26 DIAGNOSIS — N183 Chronic kidney disease, stage 3 (moderate): Secondary | ICD-10-CM | POA: Diagnosis not present

## 2016-08-26 DIAGNOSIS — N08 Glomerular disorders in diseases classified elsewhere: Secondary | ICD-10-CM | POA: Diagnosis not present

## 2016-08-26 DIAGNOSIS — I130 Hypertensive heart and chronic kidney disease with heart failure and stage 1 through stage 4 chronic kidney disease, or unspecified chronic kidney disease: Secondary | ICD-10-CM | POA: Diagnosis not present

## 2016-09-16 DIAGNOSIS — Z1231 Encounter for screening mammogram for malignant neoplasm of breast: Secondary | ICD-10-CM | POA: Diagnosis not present

## 2016-10-07 DIAGNOSIS — Z95810 Presence of automatic (implantable) cardiac defibrillator: Secondary | ICD-10-CM | POA: Diagnosis not present

## 2016-10-07 DIAGNOSIS — Z4502 Encounter for adjustment and management of automatic implantable cardiac defibrillator: Secondary | ICD-10-CM | POA: Diagnosis not present

## 2016-10-24 DIAGNOSIS — E118 Type 2 diabetes mellitus with unspecified complications: Secondary | ICD-10-CM | POA: Diagnosis not present

## 2016-10-24 DIAGNOSIS — E1165 Type 2 diabetes mellitus with hyperglycemia: Secondary | ICD-10-CM | POA: Diagnosis not present

## 2016-10-24 DIAGNOSIS — I428 Other cardiomyopathies: Secondary | ICD-10-CM | POA: Diagnosis not present

## 2016-11-17 DIAGNOSIS — I5042 Chronic combined systolic (congestive) and diastolic (congestive) heart failure: Secondary | ICD-10-CM | POA: Diagnosis not present

## 2016-11-17 DIAGNOSIS — I130 Hypertensive heart and chronic kidney disease with heart failure and stage 1 through stage 4 chronic kidney disease, or unspecified chronic kidney disease: Secondary | ICD-10-CM | POA: Diagnosis not present

## 2016-11-17 DIAGNOSIS — E1122 Type 2 diabetes mellitus with diabetic chronic kidney disease: Secondary | ICD-10-CM | POA: Diagnosis not present

## 2016-11-17 DIAGNOSIS — N182 Chronic kidney disease, stage 2 (mild): Secondary | ICD-10-CM | POA: Diagnosis not present

## 2017-02-19 DIAGNOSIS — H40023 Open angle with borderline findings, high risk, bilateral: Secondary | ICD-10-CM | POA: Diagnosis not present

## 2017-02-23 DIAGNOSIS — I5042 Chronic combined systolic (congestive) and diastolic (congestive) heart failure: Secondary | ICD-10-CM | POA: Diagnosis not present

## 2017-02-23 DIAGNOSIS — Z23 Encounter for immunization: Secondary | ICD-10-CM | POA: Diagnosis not present

## 2017-02-23 DIAGNOSIS — N183 Chronic kidney disease, stage 3 (moderate): Secondary | ICD-10-CM | POA: Diagnosis not present

## 2017-02-23 DIAGNOSIS — E1122 Type 2 diabetes mellitus with diabetic chronic kidney disease: Secondary | ICD-10-CM | POA: Diagnosis not present

## 2017-02-23 DIAGNOSIS — N08 Glomerular disorders in diseases classified elsewhere: Secondary | ICD-10-CM | POA: Diagnosis not present

## 2017-05-28 DIAGNOSIS — Z4502 Encounter for adjustment and management of automatic implantable cardiac defibrillator: Secondary | ICD-10-CM | POA: Diagnosis not present

## 2017-05-28 DIAGNOSIS — R062 Wheezing: Secondary | ICD-10-CM | POA: Diagnosis not present

## 2017-05-28 DIAGNOSIS — R05 Cough: Secondary | ICD-10-CM | POA: Diagnosis not present

## 2017-05-28 DIAGNOSIS — J45909 Unspecified asthma, uncomplicated: Secondary | ICD-10-CM | POA: Diagnosis not present

## 2017-05-28 DIAGNOSIS — Z8679 Personal history of other diseases of the circulatory system: Secondary | ICD-10-CM | POA: Diagnosis not present

## 2017-05-28 DIAGNOSIS — M79642 Pain in left hand: Secondary | ICD-10-CM | POA: Diagnosis not present

## 2017-05-28 DIAGNOSIS — R200 Anesthesia of skin: Secondary | ICD-10-CM | POA: Diagnosis not present

## 2017-05-28 DIAGNOSIS — G4762 Sleep related leg cramps: Secondary | ICD-10-CM | POA: Diagnosis not present

## 2017-05-28 DIAGNOSIS — I447 Left bundle-branch block, unspecified: Secondary | ICD-10-CM | POA: Diagnosis not present

## 2017-05-28 DIAGNOSIS — R5383 Other fatigue: Secondary | ICD-10-CM | POA: Diagnosis not present

## 2017-06-01 ENCOUNTER — Emergency Department (HOSPITAL_BASED_OUTPATIENT_CLINIC_OR_DEPARTMENT_OTHER)
Admission: EM | Admit: 2017-06-01 | Discharge: 2017-06-01 | Disposition: A | Discharge status: Home / Self Care | Payer: Medicare Other | Attending: Emergency Medicine | Admitting: Emergency Medicine

## 2017-06-01 ENCOUNTER — Emergency Department (HOSPITAL_BASED_OUTPATIENT_CLINIC_OR_DEPARTMENT_OTHER): Payer: Medicare Other

## 2017-06-01 ENCOUNTER — Other Ambulatory Visit: Payer: Self-pay

## 2017-06-01 DIAGNOSIS — Z79899 Other long term (current) drug therapy: Secondary | ICD-10-CM | POA: Insufficient documentation

## 2017-06-01 DIAGNOSIS — E119 Type 2 diabetes mellitus without complications: Secondary | ICD-10-CM | POA: Insufficient documentation

## 2017-06-01 DIAGNOSIS — I10 Essential (primary) hypertension: Secondary | ICD-10-CM | POA: Diagnosis not present

## 2017-06-01 DIAGNOSIS — Z794 Long term (current) use of insulin: Secondary | ICD-10-CM | POA: Insufficient documentation

## 2017-06-01 DIAGNOSIS — Z7982 Long term (current) use of aspirin: Secondary | ICD-10-CM | POA: Diagnosis not present

## 2017-06-01 DIAGNOSIS — J45909 Unspecified asthma, uncomplicated: Secondary | ICD-10-CM | POA: Insufficient documentation

## 2017-06-01 DIAGNOSIS — M79602 Pain in left arm: Secondary | ICD-10-CM | POA: Diagnosis not present

## 2017-06-01 DIAGNOSIS — M542 Cervicalgia: Secondary | ICD-10-CM | POA: Diagnosis not present

## 2017-06-01 DIAGNOSIS — M5412 Radiculopathy, cervical region: Secondary | ICD-10-CM | POA: Insufficient documentation

## 2017-06-01 DIAGNOSIS — R531 Weakness: Secondary | ICD-10-CM | POA: Diagnosis not present

## 2017-06-01 LAB — CBC WITH DIFFERENTIAL/PLATELET
Basophils Absolute: 0 10*3/uL (ref 0.0–0.1)
Basophils Relative: 0 %
Eosinophils Absolute: 0.3 10*3/uL (ref 0.0–0.7)
Eosinophils Relative: 3 %
HCT: 33.1 % — ABNORMAL LOW (ref 36.0–46.0)
Hemoglobin: 10.8 g/dL — ABNORMAL LOW (ref 12.0–15.0)
Lymphocytes Relative: 29 %
Lymphs Abs: 3 10*3/uL (ref 0.7–4.0)
MCH: 26.7 pg (ref 26.0–34.0)
MCHC: 32.6 g/dL (ref 30.0–36.0)
MCV: 81.9 fL (ref 78.0–100.0)
Monocytes Absolute: 0.5 10*3/uL (ref 0.1–1.0)
Monocytes Relative: 5 %
Neutro Abs: 6.2 10*3/uL (ref 1.7–7.7)
Neutrophils Relative %: 63 %
Platelets: 319 10*3/uL (ref 150–400)
RBC: 4.04 MIL/uL (ref 3.87–5.11)
RDW: 14.3 % (ref 11.5–15.5)
WBC: 10.1 10*3/uL (ref 4.0–10.5)

## 2017-06-01 LAB — BASIC METABOLIC PANEL
Anion gap: 9 (ref 5–15)
BUN: 29 mg/dL — ABNORMAL HIGH (ref 6–20)
CO2: 23 mmol/L (ref 22–32)
Calcium: 9 mg/dL (ref 8.9–10.3)
Chloride: 107 mmol/L (ref 101–111)
Creatinine, Ser: 1.75 mg/dL — ABNORMAL HIGH (ref 0.44–1.00)
GFR calc Af Amer: 34 mL/min — ABNORMAL LOW (ref >60)
GFR calc non Af Amer: 29 mL/min — ABNORMAL LOW (ref >60)
Glucose, Bld: 153 mg/dL — ABNORMAL HIGH (ref 65–99)
Potassium: 4.7 mmol/L (ref 3.5–5.1)
Sodium: 139 mmol/L (ref 135–145)

## 2017-06-01 LAB — TROPONIN I: Troponin I: <0.03 ng/mL (ref <0.03)

## 2017-06-01 MED ORDER — HYDROCODONE-ACETAMINOPHEN 5-325 MG PO TABS: 1.0000 | ORAL_TABLET | Freq: Four times a day (QID) | ORAL | 0 refills | Status: DC | PRN

## 2017-06-01 MED ORDER — HYDROCODONE-ACETAMINOPHEN 5-325 MG PO TABS
1.0000 | ORAL_TABLET | Freq: Once | ORAL | Status: AC
  Administered 2017-06-01: 1 via ORAL
  Filled 2017-06-01: qty 1

## 2017-06-01 MED FILL — HYDROCODON-APAP 5-325: 5-325 | 3 days supply | Qty: 10 | Fill #0

## 2017-06-01 NOTE — ED Notes (Signed)
ED Provider at bedside. 

## 2017-06-01 NOTE — ED Provider Notes (Signed)
[facility] EMERGENCY DEPARTMENT Provider Note   CSN: [id number] Arrival date & time: 06/01/17  0957     History   Chief Complaint Chief Complaint  Patient presents with  . Numbness    HPI Yvonne Miller is a 67 y.o. female.  The history is provided by the patient and medical records. No language interpreter was used.   Yvonne Miller is a 67 y.o. female  with a PMH of HTN, HLD, DM, ICD in place who presents to the Emergency Department complaining of left arm pain x 2 weeks. She states that initially, pain was intermittent. She would awaken in the middle of the night and her hand would feel numb. She assumed she was sleeping on her arm strange. She would get up, soak the arm and symptoms would improve. This would occur once every day or two. Over the last three days, symptoms have become constant and pain more severe. She feels the pain from the neck down to her hand. She describes the pain as "a bolt of electricity". She endorses a numb sensation only to the hand, but it is her entire left hand. She has also noticed that she has been dropping things with this left hand over the last 2-3 days as well. She does endorse neck pain. Denies chest pain, shortness of breath, abdominal pain, nausea, vomiting, fever, chills, back pain. Denies history of similar sxs until two weeks ago.   Past Medical History:  Diagnosis Date  . Arthritis   . Asthma   . Diabetes mellitus   . Hyperlipemia   . Hypertension   . Obesity    morbid    There are no active problems to display for this patient.   Past Surgical History:  Procedure Laterality Date  . ABDOMINAL HYSTERECTOMY    . BACK SURGERY    . BREAST SURGERY    . JOINT REPLACEMENT      OB History    No data available       Home Medications    Prior to Admission medications   Medication Sig Start Date End Date Taking? Authorizing Provider  aspirin 81 MG tablet Take 81 mg by mouth daily.    [provider]    carvedilol (COREG) 25 MG tablet Take 25 mg by mouth 2 (two) times daily with a meal.    [provider]  furosemide (LASIX) 20 MG tablet Take 20 mg by mouth 2 (two) times daily.    [provider]  furosemide (LASIX) 40 MG tablet Take 20 mg by mouth daily.    [provider]  HYDROcodone-acetaminophen (NORCO/VICODIN) 5-325 MG tablet Take 1 tablet by mouth every 6 (six) hours as needed for severe pain. 06/01/17   Ward, Ozella Almond, PA-C  insulin detemir (LEVEMIR FLEXPEN) 100 UNIT/ML injection Inject 55 Units into the skin at bedtime.    [provider]  insulin lispro protamine-insulin lispro (HUMALOG 75/25) (75-25) 100 UNIT/ML SUSP Inject 55 Units into the skin.    [provider]  metFORMIN (GLUCOPHAGE-XR) 750 MG 24 hr tablet Take 750 mg by mouth daily with breakfast.    [provider]  Multiple Vitamin (MULTIVITAMIN) tablet Take 1 tablet by mouth daily.    [provider]  olmesartan-hydrochlorothiazide (BENICAR HCT) 40-25 MG per tablet Take 1 tablet by mouth daily.    [provider]  rosuvastatin (CRESTOR) 20 MG tablet Take 20 mg by mouth daily.    [provider]  saxagliptin HCl (  ONGLYZA) 2.5 MG TABS tablet Take 5 mg by mouth daily.    [provider]    Family History Family History  Problem Relation Age of Onset  . Hypertension Mother   . Stroke Mother   . Heart disease Father   . Heart attack Sister   . Heart attack Brother     Social History Social History   Tobacco Use  . Smoking status: Never Smoker  Substance Use Topics  . Alcohol use: No  . Drug use: No     Allergies   Patient has no known allergies.   Review of Systems Review of Systems  Musculoskeletal: Positive for neck pain. Negative for back pain.  Neurological: Positive for weakness and numbness. Negative for dizziness, speech difficulty and headaches.  All other systems reviewed and are negative.    Physical  Exam Updated Vital Signs BP (!) 103/57 (BP Location: Right Arm)   Pulse 70   Temp 98.5 F (36.9 C) (Oral)   Resp 18   Ht 5\' 4"  (1.626 m)   Wt 118.3 kg (260 lb 12.9 oz)   SpO2 99%   BMI 44.77 kg/m   Physical Exam  Constitutional: She is oriented to person, place, and time. She appears well-developed and well-nourished. No distress.  HENT:  Head: Normocephalic and atraumatic.  Neck:  + midline and left paraspinal tenderness.   Cardiovascular: Normal rate, regular rhythm and normal heart sounds.  No murmur heard. Pulmonary/Chest: Effort normal and breath sounds normal. No respiratory distress.  Abdominal: Soft. She exhibits no distension. There is no tenderness.  Musculoskeletal:  4/5 strength including grip strength and pincer grasp to the LUE. 5/5 to RUE and bilateral LE's.  Neurological: She is alert and oriented to person, place, and time.  Alert, oriented, thought content appropriate, able to give a coherent history. Speech is clear and goal oriented, able to follow commands. Cranial Nerves II-XII grossly intact. Decreased sensation to light touch to radial and median nerve distributions. Sensation equal and intact to ulnar nerve distribution.  Skin: Skin is warm and dry.  Nursing note and vitals reviewed.    ED Treatments / Results  Labs (all labs ordered are listed, but only abnormal results are displayed) Labs Reviewed  CBC WITH DIFFERENTIAL/PLATELET - Abnormal; Notable for the following components:      Result Value   Hemoglobin 10.8 (*)    HCT 33.1 (*)    All other components within normal limits  BASIC METABOLIC PANEL - Abnormal; Notable for the following components:   Glucose, Bld 153 (*)    BUN 29 (*)    Creatinine, Ser 1.75 (*)    GFR calc non Af Amer 29 (*)    GFR calc Af Amer 34 (*)    All other components within normal limits  TROPONIN I    EKG  EKG Interpretation  Date/Time:  Monday June 01 2017 10:05:55 EST Ventricular Rate:  66 PR  Interval:  116 QRS Duration: 142 QT Interval:  468 QTC Calculation: 490 R Axis:   -67 Text Interpretation:  Atrial-sensed ventricular-paced rhythm Biventricular pacemaker detected Abnormal ECG paced rhythm new since previous tracing Confirmed by Theotis Burrow [phone]) on 06/01/2017 10:11:16 AM       Radiology Ct Head Wo Contrast  Result Date: 06/01/2017 CLINICAL DATA:  Left upper extremity pain and weakness. EXAM: CT HEAD WITHOUT CONTRAST CT CERVICAL SPINE WITHOUT CONTRAST TECHNIQUE: Multidetector CT imaging of the head and cervical spine was performed following the standard protocol without  intravenous contrast. Multiplanar CT image reconstructions of the cervical spine were also generated. COMPARISON:  CT scan of February 15, 2007. FINDINGS: CT HEAD FINDINGS Brain: No evidence of acute infarction, hemorrhage, hydrocephalus, extra-axial collection or mass lesion/mass effect. Vascular: No hyperdense vessel or unexpected calcification. Skull: Normal. Negative for fracture or focal lesion. Sinuses/Orbits: No acute finding. Other: None. CT CERVICAL SPINE FINDINGS Alignment: No significant spondylolisthesis is noted. Skull base and vertebrae: Status post surgical anterior fusion of C4-5 and C5-6. No fracture is noted. Soft tissues and spinal canal: No prevertebral fluid or swelling. No visible canal hematoma. Disc levels: Severe degenerative disc disease is noted at C3-4 with anterior osteophyte formation. Upper chest: Negative. Other: None. IMPRESSION: Normal head CT. Postsurgical and degenerative changes are noted in the cervical spine. No acute abnormality is noted. Electronically Signed   By: Marijo Conception, M.D.   On: 06/01/2017 11:05   Ct Cervical Spine Wo Contrast  Result Date: 06/01/2017 CLINICAL DATA:  Left upper extremity pain and weakness. EXAM: CT HEAD WITHOUT CONTRAST CT CERVICAL SPINE WITHOUT CONTRAST TECHNIQUE: Multidetector CT imaging of the head and cervical spine was performed following  the standard protocol without intravenous contrast. Multiplanar CT image reconstructions of the cervical spine were also generated. COMPARISON:  CT scan of February 15, 2007. FINDINGS: CT HEAD FINDINGS Brain: No evidence of acute infarction, hemorrhage, hydrocephalus, extra-axial collection or mass lesion/mass effect. Vascular: No hyperdense vessel or unexpected calcification. Skull: Normal. Negative for fracture or focal lesion. Sinuses/Orbits: No acute finding. Other: None. CT CERVICAL SPINE FINDINGS Alignment: No significant spondylolisthesis is noted. Skull base and vertebrae: Status post surgical anterior fusion of C4-5 and C5-6. No fracture is noted. Soft tissues and spinal canal: No prevertebral fluid or swelling. No visible canal hematoma. Disc levels: Severe degenerative disc disease is noted at C3-4 with anterior osteophyte formation. Upper chest: Negative. Other: None. IMPRESSION: Normal head CT. Postsurgical and degenerative changes are noted in the cervical spine. No acute abnormality is noted. Electronically Signed   By: Marijo Conception, M.D.   On: 06/01/2017 11:05    Procedures Procedures (including critical care time)  Medications Ordered in ED Medications  HYDROcodone-acetaminophen (NORCO/VICODIN) 5-325 MG per tablet 1 tablet (not administered)     Initial Impression / Assessment and Plan / ED Course  I have reviewed the triage vital signs and the nursing notes.  Pertinent labs & imaging results that were available during my care of the patient were reviewed by me and considered in my medical decision making (see chart for details).    Yvonne Miller is a 67 y.o. female who presents to ED for left arm pain associated with numbness and weakness x 2 weeks, acutely worsening this morning around 4 am. Hx of prior cervical procedure several years ago. + midline tenderness. History and examination c/w cervical radiculopathy. Given risk factors, EKG and troponin obtained which were  reassuring. Doubt cardiac etiology. Basic labs reviewed. Does have elevation in creatinine compared to last value which was several years ago. Patient states that she has been told this before and PCP is managing. CT head negative. CT cervical with degenerative and post-surgical changes. No acute findings. Will treat symptoms and have patient follow up with neurosurgery. Return precautions discussed and all questions answered.   Patient seen by and discussed with Dr. Rex Kras who agrees with treatment plan.   Final Clinical Impressions(s) / ED Diagnoses   Final diagnoses:  Cervical radiculopathy  Left arm pain    ED  Discharge Orders        Ordered    HYDROcodone-acetaminophen (NORCO/VICODIN) 5-325 MG tablet  Every 6 hours PRN     06/01/17 1122       Ward, Ozella Almond, PA-C 06/01/17 1128    Little, Wenda Overland, MD 06/01/17 [id number]

## 2017-06-01 NOTE — ED Notes (Signed)
Patient transported to CT 

## 2017-06-01 NOTE — Discharge Instructions (Signed)
It was my pleasure taking care of you today!   Please call the neurosurgeon listed today to schedule a follow up appointment.   Return to ER for new or worsening symptoms, any additional concerns.   Take pain medication only as needed for severe pain.   Do not drink alcohol, drive or participate in any other potentially dangerous activities while taking opiate pain medication as it may make you sleepy. Do not take this medication with any other sedating medications, either prescription or over-the-counter. If you were prescribed Percocet or Vicodin, do not take these with acetaminophen (Tylenol) as it is already contained within these medications.   This medication is an opiate (or narcotic) pain medication and can be habit forming.  Use it as little as possible to achieve adequate pain control.  Do not use or use it with extreme caution if you have a history of opiate abuse or dependence. This medication is intended for your use only - do not give any to anyone else and keep it in a secure place where nobody else, especially children, have access to it. It will also cause or worsen constipation, so you may want to consider taking an over-the-counter stool softener while you are taking this medication.\

## 2017-06-01 NOTE — ED Triage Notes (Addendum)
Pt reports intermittent pain that increases with movements and positions "like an electric shock" down her left arm x Friday, pt reports pain, tingling and numbness x 2am. ekg performed while pt at triage. Speech is clear, smile symmetrical.

## 2017-06-08 DIAGNOSIS — M4722 Other spondylosis with radiculopathy, cervical region: Secondary | ICD-10-CM | POA: Diagnosis not present

## 2017-06-08 DIAGNOSIS — H40023 Open angle with borderline findings, high risk, bilateral: Secondary | ICD-10-CM | POA: Diagnosis not present

## 2017-06-08 DIAGNOSIS — I10 Essential (primary) hypertension: Secondary | ICD-10-CM | POA: Diagnosis not present

## 2017-06-08 DIAGNOSIS — M25512 Pain in left shoulder: Secondary | ICD-10-CM | POA: Diagnosis not present

## 2017-06-08 DIAGNOSIS — G5602 Carpal tunnel syndrome, left upper limb: Secondary | ICD-10-CM | POA: Diagnosis not present

## 2017-06-11 DIAGNOSIS — N08 Glomerular disorders in diseases classified elsewhere: Secondary | ICD-10-CM | POA: Diagnosis not present

## 2017-06-11 DIAGNOSIS — I129 Hypertensive chronic kidney disease with stage 1 through stage 4 chronic kidney disease, or unspecified chronic kidney disease: Secondary | ICD-10-CM | POA: Diagnosis not present

## 2017-06-11 DIAGNOSIS — Z79899 Other long term (current) drug therapy: Secondary | ICD-10-CM | POA: Diagnosis not present

## 2017-06-11 DIAGNOSIS — N183 Chronic kidney disease, stage 3 (moderate): Secondary | ICD-10-CM | POA: Diagnosis not present

## 2017-06-11 DIAGNOSIS — E1122 Type 2 diabetes mellitus with diabetic chronic kidney disease: Secondary | ICD-10-CM | POA: Diagnosis not present

## 2017-06-18 DIAGNOSIS — G5603 Carpal tunnel syndrome, bilateral upper limbs: Secondary | ICD-10-CM | POA: Diagnosis not present

## 2017-06-18 DIAGNOSIS — G5622 Lesion of ulnar nerve, left upper limb: Secondary | ICD-10-CM | POA: Diagnosis not present

## 2017-06-22 ENCOUNTER — Other Ambulatory Visit (HOSPITAL_COMMUNITY): Payer: Self-pay | Admitting: Neurosurgery

## 2017-06-22 ENCOUNTER — Other Ambulatory Visit: Payer: Self-pay | Admitting: Neurosurgery

## 2017-06-22 DIAGNOSIS — M4722 Other spondylosis with radiculopathy, cervical region: Secondary | ICD-10-CM

## 2017-06-23 DIAGNOSIS — N182 Chronic kidney disease, stage 2 (mild): Secondary | ICD-10-CM | POA: Diagnosis not present

## 2017-06-23 DIAGNOSIS — I130 Hypertensive heart and chronic kidney disease with heart failure and stage 1 through stage 4 chronic kidney disease, or unspecified chronic kidney disease: Secondary | ICD-10-CM | POA: Diagnosis not present

## 2017-06-23 DIAGNOSIS — N08 Glomerular disorders in diseases classified elsewhere: Secondary | ICD-10-CM | POA: Diagnosis not present

## 2017-06-23 DIAGNOSIS — E1122 Type 2 diabetes mellitus with diabetic chronic kidney disease: Secondary | ICD-10-CM | POA: Diagnosis not present

## 2017-07-01 ENCOUNTER — Ambulatory Visit (HOSPITAL_COMMUNITY)
Admission: RE | Admit: 2017-07-01 | Discharge: 2017-07-01 | Disposition: A | Discharge status: Home / Self Care | Payer: Medicare Other | Source: Ambulatory Visit | Attending: Neurosurgery | Admitting: Neurosurgery

## 2017-07-01 DIAGNOSIS — M4722 Other spondylosis with radiculopathy, cervical region: Secondary | ICD-10-CM

## 2017-07-01 DIAGNOSIS — M5412 Radiculopathy, cervical region: Secondary | ICD-10-CM | POA: Diagnosis not present

## 2017-07-01 DIAGNOSIS — M4802 Spinal stenosis, cervical region: Secondary | ICD-10-CM | POA: Diagnosis not present

## 2017-07-01 DIAGNOSIS — Z981 Arthrodesis status: Secondary | ICD-10-CM | POA: Diagnosis not present

## 2017-07-01 DIAGNOSIS — M50123 Cervical disc disorder at C6-C7 level with radiculopathy: Secondary | ICD-10-CM | POA: Insufficient documentation

## 2017-07-01 LAB — GLUCOSE, CAPILLARY
Glucose-Capillary: 113 mg/dL — ABNORMAL HIGH (ref 65–99)
Glucose-Capillary: 114 mg/dL — ABNORMAL HIGH (ref 65–99)
Glucose-Capillary: 183 mg/dL — ABNORMAL HIGH (ref 65–99)

## 2017-07-01 LAB — NO BLOOD PRODUCTS

## 2017-07-01 MED ORDER — OXYCODONE HCL 5 MG PO TABS
ORAL_TABLET | ORAL | Status: AC
  Filled 2017-07-01: qty 2

## 2017-07-01 MED ORDER — SODIUM CHLORIDE 0.9 % IV SOLN
INTRAVENOUS | Status: DC
  Administered 2017-07-01: 14:00:00 via INTRAVENOUS

## 2017-07-01 MED ORDER — DIAZEPAM 5 MG PO TABS
10.0000 mg | ORAL_TABLET | Freq: Once | ORAL | Status: AC
  Administered 2017-07-01: 10 mg via ORAL

## 2017-07-01 MED ORDER — IOPAMIDOL (ISOVUE-M 300) INJECTION 61%
15.0000 mL | Freq: Once | INTRAMUSCULAR | Status: AC | PRN
  Administered 2017-07-01: 15 mL via INTRATHECAL

## 2017-07-01 MED ORDER — DIAZEPAM 5 MG PO TABS
ORAL_TABLET | ORAL | Status: AC
  Filled 2017-07-01: qty 2

## 2017-07-01 MED ORDER — OXYCODONE HCL 5 MG PO TABS
5.0000 mg | ORAL_TABLET | ORAL | Status: DC | PRN
  Administered 2017-07-01: 10 mg via ORAL

## 2017-07-01 MED ORDER — LIDOCAINE HCL (PF) 1 % IJ SOLN
5.0000 mL | Freq: Once | INTRAMUSCULAR | Status: AC
  Administered 2017-07-01: 5 mL via INTRADERMAL

## 2017-07-01 MED ORDER — ONDANSETRON HCL 4 MG/2ML IJ SOLN
INTRAMUSCULAR | Status: AC
  Administered 2017-07-01: 4 mg via INTRAVENOUS
  Filled 2017-07-01: qty 2

## 2017-07-01 MED ORDER — ONDANSETRON HCL 4 MG/2ML IJ SOLN
4.0000 mg | Freq: Four times a day (QID) | INTRAMUSCULAR | Status: DC | PRN
  Administered 2017-07-01: 4 mg via INTRAVENOUS

## 2017-07-01 MED ORDER — PROCHLORPERAZINE 25 MG RE SUPP
25.0000 mg | Freq: Two times a day (BID) | RECTAL | Status: DC | PRN
  Filled 2017-07-01: qty 1

## 2017-07-01 NOTE — Progress Notes (Signed)
Pt with N/V.  Dr Christella Noa notified, give compazine 25mg  suppository and start NS at 29.  Will continue to monitor.

## 2017-07-01 NOTE — Progress Notes (Signed)
Spoke with Dr Christella Noa, updated him on pt's condition. Pt free of n/v. Per MD, okay to D/C.

## 2017-07-01 NOTE — Discharge Instructions (Signed)
Myelogram, Care After °These instructions give you information about caring for yourself after your procedure. Your doctor may also give you more specific instructions. Call your doctor if you have any problems or questions after your procedure. °Follow these instructions at home: °· Drink enough fluid to keep your pee (urine) clear or pale yellow. °· Rest as told by your doctor. °· Lie flat with your head slightly raised (elevated). °· Do not bend, lift, or do any hard activities for 24-48 hours or as told by your doctor. °· Take over-the-counter and prescription medicines only as told by your doctor. °· Take care of and remove your bandage (dressing) as told by your doctor. °· Bathe or shower as told by your doctor. °Contact a health care provider if: °· You have a fever. °· You have a headache that lasts longer than 24 hours. °· You feel sick to your stomach (nauseous). °· You throw up (vomit). °· Your neck is stiff. °· Your legs feel numb. °· You cannot pee. °· You cannot poop (have a bowel movement). °· You have a rash. °· You are itchy or sneezing. °Get help right away if: °· You have new symptoms or your symptoms get worse. °· You have a seizure. °· You have trouble breathing. °This information is not intended to replace advice given to you by your health care provider. Make sure you discuss any questions you have with your health care provider. °Document Released: 01/29/2008 Document Revised: 12/20/2015 Document Reviewed: 02/01/2015 °Elsevier Interactive Patient Education © 2018 [employer] ° °

## 2017-07-01 NOTE — Op Note (Signed)
*   No surgery found * Cervical Myelogram  PATIENT:  Yvonne Miller is a 67 y.o. female with prior cervical fusion C4-6, now with left upper extremity pain.   PRE-OPERATIVE DIAGNOSIS:  Cervical spondylosis with radiculopathy left  POST-OPERATIVE DIAGNOSIS:  Cervical spondylosis with radiculopathy, left  PROCEDURE:  Lumbar puncture for cervical Myelogram  SURGEON:  Jeannemarie Sawaya  ANESTHESIA:   local LOCAL MEDICATIONS USED:  LIDOCAINE  and Amount: 10 ml Procedure Note: Yvonne Miller is a 67 y.o. female Was taken to the fluoroscopy suite and  positioned prone on the fluoroscopy table. Her back was prepared and draped in a sterile manner. I infiltrated 10 cc into the lumbar region. I then introduced a spinal needle into the thecal sac at the L3/4 interlaminar space. I infiltrated 15cc of Isovue 300 into the thecal sac. Fluoroscopy showed the needle and contrast in the thecal sac. Yvonne Miller tolerated the procedure well. she Will be taken to CT for evaluation.     PATIENT DISPOSITION:  Short Stay

## 2017-07-06 ENCOUNTER — Other Ambulatory Visit: Payer: Self-pay | Admitting: Neurosurgery

## 2017-07-06 DIAGNOSIS — I10 Essential (primary) hypertension: Secondary | ICD-10-CM | POA: Diagnosis not present

## 2017-07-06 DIAGNOSIS — G5602 Carpal tunnel syndrome, left upper limb: Secondary | ICD-10-CM | POA: Diagnosis not present

## 2017-07-09 ENCOUNTER — Encounter (HOSPITAL_COMMUNITY): Payer: Self-pay | Admitting: *Deleted

## 2017-07-09 ENCOUNTER — Other Ambulatory Visit: Payer: Self-pay

## 2017-07-09 NOTE — Progress Notes (Signed)
Spoke with [name], Public librarian to make him aware that pt was a having hand surgery tomorrow and the Peri-op prescription for ICD is still pending. [name] stated that a magnet will be used for hand surgery; will follow up once info is received.

## 2017-07-09 NOTE — Progress Notes (Signed)
Anesthesia Chart Review:  Pt is a same day work up  Pt is a 67 year old female scheduled for L carpal tunnel release on 07/10/2017 with Ashok Pall, MD  - PCP is [name], MD - Cardiologist is Kela Millin, MD.  Last office visit 10/24/16 with Jeri Lager, NP - EP cardiologist is Murtis Sink, MD.  Last office visit 05/28/17 with Eloisa Northern, NP; 58-month follow-up recommended (notes in care everywhere)  PMH includes: Nonischemic cardiomyopathy, LBBB, BiV ICD, HTN, DM, hyperlipidemia, asthma.  Never smoker.  BMI 44.5.  Medications include: Albuterol, amlodipine, ASA 81 mg, carvedilol, hydralazine, Novolin 70/30, metformin, pravastatin, sitagliptin, spironolactone, valsartan-HCTZ  Labs will be obtained day of surgery  EKG 06/01/17: Atrial sensed ventricular paced rhythm  Echo 07/06/14 [facility] cardiovascular): 1.  LV cavity minimally dilated.  Mild concentric LVH.  Normal global wall motion.  Doppler evidence of grade IAI diastolic dysfunction.  Normal systolic function.  Calculated EF 55%. 2.  LA cavity mild to moderately dilated. 3.  Trace mitral regurgitation. 4.  Mild tricuspid regurgitation.  No evidence of pulmonary hypertension. 5.  Compared to prior echo 09/12/13, there is remarkable improvement of LV systolic function.  EF has improved from 28% to 55%  Cardiac cath 03/04/11:  1.  Nonischemic dilated cardiomyopathy with EF 40-45% with global hypokinesis without significant mitral regurgitation. 2.  Marked LV hypertrophy 3.  Mild luminal irregularity of the coronary arteries without any significant stenosis. 4.  Moderate pulmonary hypertension with elevated cardiac output and cardiac index due to morbid obesity. 5.  Elevated LV end-diastolic pressure secondary to marked LVH with morbid obesity  Perioperative prescription form for ICD pending.  Willeen Cass, FNP-BC [facility]/Anesthesiology Phone: [phone] 07/09/2017 1:41 PM

## 2017-07-09 NOTE — Anesthesia Preprocedure Evaluation (Addendum)
Anesthesia Evaluation  Patient identified by MRN, date of birth, ID band Patient awake    Reviewed: Allergy & Precautions, NPO status , Patient's Chart, lab work & pertinent test results, reviewed documented beta blocker date and time   History of Anesthesia Complications Negative for: history of anesthetic complications  Airway Mallampati: II  TM Distance: >3 FB Neck ROM: Full    Dental  (+) Dental Advisory Given   Pulmonary sleep apnea and Continuous Positive Airway Pressure Ventilation , COPD,  COPD inhaler,    breath sounds clear to auscultation       Cardiovascular hypertension, Pt. on medications and Pt. on home beta blockers (-) CAD + pacemaker (BiV) + Cardiac Defibrillator (has never delivered shock)  Rhythm:Regular Rate:Normal  '16 ECHO: EF 55%, mild LVH, mild MR   Neuro/Psych negative neurological ROS     GI/Hepatic Neg liver ROS, GERD  Medicated and Controlled,  Endo/Other  diabetes (glu 139), Insulin Dependent, Oral Hypoglycemic AgentsMorbid obesity  Renal/GU negative Renal ROS     Musculoskeletal  (+) Arthritis ,   Abdominal (+) + obese,   Peds  Hematology  (+) REFUSES BLOOD PRODUCTS, JEHOVAH'S WITNESS (accepts albumin)  Anesthesia Other Findings   Reproductive/Obstetrics                            Anesthesia Physical Anesthesia Plan  ASA: III  Anesthesia Plan: MAC   Post-op Pain Management:    Induction:   PONV Risk Score and Plan: 2 and Ondansetron and Treatment may vary due to age or medical condition  Airway Management Planned: Natural Airway and Simple Face Mask  Additional Equipment:   Intra-op Plan:   Post-operative Plan:   Informed Consent: I have reviewed the patients History and Physical, chart, labs and discussed the procedure including the risks, benefits and alternatives for the proposed anesthesia with the patient or authorized representative who has  indicated his/her understanding and acceptance.   Dental advisory given  Plan Discussed with: CRNA and Surgeon  Anesthesia Plan Comments: (Plan routine monitors, MAC, with magnet on device)        Anesthesia Quick Evaluation

## 2017-07-09 NOTE — Progress Notes (Signed)
Pt denies any acute cardiopulmonary issues. Pt under the care of both Dr Einar Gip and Dr. Dwana Curd (EP), cardiology. Pt stated that a stress test and echo were both performed; requested records from Dr. Einar Gip. Peri-op prescription for ICD faxed to Dr. Dwana Curd (EP). Pt stated that an A1c was drawn at PCP's office, Dr. Baird Cancer; records requested. Pt stated that last dose of Aspirin was Monday as instructed by MD. Pt made aware to stop taking vitamins, fish oil, cinnamon and herbal medications. Do not take any NSAIDs ie: Ibuprofen, Advil, Naproxen (Aleve), Motrin, BC and Goody Powder. Pt made aware to take 35 units of 70-30 insulin tonight (not 50) and no insulin DOS , no Januvia and Metformin DOS. Pt made aware to check BG every 2 hours prior to arrival to hospital on DOS. Pt made aware to treat a BG < 70 with  4 ounces of cranberry juice, wait 15 minutes after intervention to recheck BG, if BG remains < 70, call Short Stay unit to speak with a nurse. Pt verbalized understanding of all pre-op instructions. Anesthesia asked to review pt history.

## 2017-07-10 ENCOUNTER — Encounter (HOSPITAL_COMMUNITY)
Admission: RE | Disposition: A | Discharge status: Home / Self Care | Payer: Self-pay | Source: Ambulatory Visit | Attending: Neurosurgery

## 2017-07-10 ENCOUNTER — Ambulatory Visit (HOSPITAL_COMMUNITY)
Admission: RE | Admit: 2017-07-10 | Discharge: 2017-07-10 | Disposition: A | Discharge status: Home / Self Care | Payer: Medicare Other | Source: Ambulatory Visit | Attending: Neurosurgery | Admitting: Neurosurgery

## 2017-07-10 ENCOUNTER — Ambulatory Visit (HOSPITAL_COMMUNITY): Payer: Medicare Other | Admitting: Emergency Medicine

## 2017-07-10 ENCOUNTER — Encounter (HOSPITAL_COMMUNITY): Payer: Self-pay | Admitting: *Deleted

## 2017-07-10 DIAGNOSIS — G5603 Carpal tunnel syndrome, bilateral upper limbs: Secondary | ICD-10-CM | POA: Insufficient documentation

## 2017-07-10 DIAGNOSIS — E785 Hyperlipidemia, unspecified: Secondary | ICD-10-CM | POA: Diagnosis not present

## 2017-07-10 DIAGNOSIS — G4730 Sleep apnea, unspecified: Secondary | ICD-10-CM | POA: Diagnosis not present

## 2017-07-10 DIAGNOSIS — Z794 Long term (current) use of insulin: Secondary | ICD-10-CM | POA: Insufficient documentation

## 2017-07-10 DIAGNOSIS — I081 Rheumatic disorders of both mitral and tricuspid valves: Secondary | ICD-10-CM | POA: Diagnosis not present

## 2017-07-10 DIAGNOSIS — K219 Gastro-esophageal reflux disease without esophagitis: Secondary | ICD-10-CM | POA: Insufficient documentation

## 2017-07-10 DIAGNOSIS — G5602 Carpal tunnel syndrome, left upper limb: Secondary | ICD-10-CM | POA: Diagnosis not present

## 2017-07-10 DIAGNOSIS — Z9660 Presence of unspecified orthopedic joint implant: Secondary | ICD-10-CM | POA: Insufficient documentation

## 2017-07-10 DIAGNOSIS — Z9989 Dependence on other enabling machines and devices: Secondary | ICD-10-CM | POA: Diagnosis not present

## 2017-07-10 DIAGNOSIS — Z79899 Other long term (current) drug therapy: Secondary | ICD-10-CM | POA: Diagnosis not present

## 2017-07-10 DIAGNOSIS — Z6841 Body Mass Index (BMI) 40.0 and over, adult: Secondary | ICD-10-CM | POA: Diagnosis not present

## 2017-07-10 DIAGNOSIS — I10 Essential (primary) hypertension: Secondary | ICD-10-CM | POA: Diagnosis not present

## 2017-07-10 DIAGNOSIS — Z7982 Long term (current) use of aspirin: Secondary | ICD-10-CM | POA: Insufficient documentation

## 2017-07-10 DIAGNOSIS — M79642 Pain in left hand: Secondary | ICD-10-CM | POA: Diagnosis present

## 2017-07-10 DIAGNOSIS — E119 Type 2 diabetes mellitus without complications: Secondary | ICD-10-CM | POA: Diagnosis not present

## 2017-07-10 DIAGNOSIS — J449 Chronic obstructive pulmonary disease, unspecified: Secondary | ICD-10-CM | POA: Diagnosis not present

## 2017-07-10 DIAGNOSIS — Z95810 Presence of automatic (implantable) cardiac defibrillator: Secondary | ICD-10-CM | POA: Diagnosis not present

## 2017-07-10 HISTORY — DX: Sleep apnea, unspecified: G47.30

## 2017-07-10 HISTORY — PX: CARPAL TUNNEL RELEASE: SHX101

## 2017-07-10 HISTORY — DX: Presence of automatic (implantable) cardiac defibrillator: Z95.810

## 2017-07-10 HISTORY — DX: Carpal tunnel syndrome, bilateral upper limbs: G56.03

## 2017-07-10 HISTORY — DX: Gastro-esophageal reflux disease without esophagitis: K21.9

## 2017-07-10 HISTORY — DX: Presence of spectacles and contact lenses: Z97.3

## 2017-07-10 LAB — BASIC METABOLIC PANEL
Anion gap: 10 (ref 5–15)
BUN: 28 mg/dL — ABNORMAL HIGH (ref 6–20)
CO2: 22 mmol/L (ref 22–32)
Calcium: 9.2 mg/dL (ref 8.9–10.3)
Chloride: 104 mmol/L (ref 101–111)
Creatinine, Ser: 1.23 mg/dL — ABNORMAL HIGH (ref 0.44–1.00)
GFR calc Af Amer: 51 mL/min — ABNORMAL LOW (ref >60)
GFR calc non Af Amer: 44 mL/min — ABNORMAL LOW (ref >60)
Glucose, Bld: 137 mg/dL — ABNORMAL HIGH (ref 65–99)
Potassium: 4.3 mmol/L (ref 3.5–5.1)
Sodium: 136 mmol/L (ref 135–145)

## 2017-07-10 LAB — GLUCOSE, CAPILLARY
Glucose-Capillary: 139 mg/dL — ABNORMAL HIGH (ref 65–99)
Glucose-Capillary: 143 mg/dL — ABNORMAL HIGH (ref 65–99)

## 2017-07-10 LAB — CBC
HCT: 35 % — ABNORMAL LOW (ref 36.0–46.0)
Hemoglobin: 11.3 g/dL — ABNORMAL LOW (ref 12.0–15.0)
MCH: 26.8 pg (ref 26.0–34.0)
MCHC: 32.3 g/dL (ref 30.0–36.0)
MCV: 82.9 fL (ref 78.0–100.0)
Platelets: 309 10*3/uL (ref 150–400)
RBC: 4.22 MIL/uL (ref 3.87–5.11)
RDW: 14.2 % (ref 11.5–15.5)
WBC: 11.3 10*3/uL — ABNORMAL HIGH (ref 4.0–10.5)

## 2017-07-10 SURGERY — CARPAL TUNNEL RELEASE
Anesthesia: Monitor Anesthesia Care | Site: Wrist | Laterality: Left

## 2017-07-10 MED ORDER — BACITRACIN ZINC 500 UNIT/GM EX OINT
TOPICAL_OINTMENT | CUTANEOUS | Status: AC
  Filled 2017-07-10: qty 28.35

## 2017-07-10 MED ORDER — CARVEDILOL 12.5 MG PO TABS
ORAL_TABLET | ORAL | Status: AC
  Filled 2017-07-10: qty 3

## 2017-07-10 MED ORDER — FENTANYL CITRATE (PF) 100 MCG/2ML IJ SOLN
INTRAMUSCULAR | Status: AC
  Filled 2017-07-10: qty 2

## 2017-07-10 MED ORDER — FENTANYL CITRATE (PF) 100 MCG/2ML IJ SOLN
25.0000 ug | INTRAMUSCULAR | Status: DC | PRN
  Administered 2017-07-10: 50 ug via INTRAVENOUS

## 2017-07-10 MED ORDER — FENTANYL CITRATE (PF) 250 MCG/5ML IJ SOLN
INTRAMUSCULAR | Status: AC
  Filled 2017-07-10: qty 5

## 2017-07-10 MED ORDER — CHLORHEXIDINE GLUCONATE CLOTH 2 % EX PADS: 6.0000 | MEDICATED_PAD | Freq: Once | CUTANEOUS | Status: DC

## 2017-07-10 MED ORDER — MIDAZOLAM HCL 2 MG/2ML IJ SOLN
INTRAMUSCULAR | Status: DC | PRN
  Administered 2017-07-10: 2 mg via INTRAVENOUS

## 2017-07-10 MED ORDER — MIDAZOLAM HCL 2 MG/2ML IJ SOLN: 0.5000 mg | Freq: Once | INTRAMUSCULAR | Status: DC | PRN

## 2017-07-10 MED ORDER — PROMETHAZINE HCL 25 MG/ML IJ SOLN: 6.2500 mg | INTRAMUSCULAR | Status: DC | PRN

## 2017-07-10 MED ORDER — BACITRACIN ZINC 500 UNIT/GM EX OINT
TOPICAL_OINTMENT | CUTANEOUS | Status: DC | PRN
  Administered 2017-07-10: 1 via TOPICAL

## 2017-07-10 MED ORDER — LIDOCAINE-EPINEPHRINE 0.5 %-1:200000 IJ SOLN
INTRAMUSCULAR | Status: DC | PRN
  Administered 2017-07-10: 8 mL

## 2017-07-10 MED ORDER — LACTATED RINGERS IV SOLN
INTRAVENOUS | Status: DC
  Administered 2017-07-10 (×2): via INTRAVENOUS

## 2017-07-10 MED ORDER — CEFAZOLIN SODIUM-DEXTROSE 2-4 GM/100ML-% IV SOLN
2.0000 g | INTRAVENOUS | Status: AC
  Administered 2017-07-10: 2 g via INTRAVENOUS
  Filled 2017-07-10: qty 100

## 2017-07-10 MED ORDER — LIDOCAINE-EPINEPHRINE 0.5 %-1:200000 IJ SOLN
INTRAMUSCULAR | Status: AC
  Filled 2017-07-10: qty 1

## 2017-07-10 MED ORDER — MIDAZOLAM HCL 2 MG/2ML IJ SOLN
INTRAMUSCULAR | Status: AC
  Filled 2017-07-10: qty 2

## 2017-07-10 MED ORDER — MEPERIDINE HCL 50 MG/ML IJ SOLN: 6.2500 mg | INTRAMUSCULAR | Status: DC | PRN

## 2017-07-10 MED ORDER — PROPOFOL 10 MG/ML IV BOLUS
INTRAVENOUS | Status: AC
  Filled 2017-07-10: qty 20

## 2017-07-10 MED ORDER — 0.9 % SODIUM CHLORIDE (POUR BTL) OPTIME
TOPICAL | Status: DC | PRN
  Administered 2017-07-10: 1000 mL

## 2017-07-10 MED ORDER — LIDOCAINE HCL (CARDIAC) 20 MG/ML IV SOLN
INTRAVENOUS | Status: AC
  Filled 2017-07-10: qty 5

## 2017-07-10 MED ORDER — DEXMEDETOMIDINE HCL IN NACL 200 MCG/50ML IV SOLN
INTRAVENOUS | Status: DC | PRN
  Administered 2017-07-10: 8 ug via INTRAVENOUS
  Administered 2017-07-10 (×2): 4 ug via INTRAVENOUS
  Administered 2017-07-10: 8 ug via INTRAVENOUS

## 2017-07-10 MED ORDER — CARVEDILOL 25 MG PO TABS
37.5000 mg | ORAL_TABLET | ORAL | Status: AC
  Administered 2017-07-10: 37.5 mg via ORAL
  Filled 2017-07-10: qty 1

## 2017-07-10 MED ORDER — ACETAMINOPHEN-CODEINE #3 300-30 MG PO TABS: 1.0000 | ORAL_TABLET | Freq: Four times a day (QID) | ORAL | 0 refills | Status: DC | PRN

## 2017-07-10 MED ORDER — ROCURONIUM BROMIDE 50 MG/5ML IV SOLN
INTRAVENOUS | Status: AC
Start: 2017-07-10 — End: 2017-07-10
  Filled 2017-07-10: qty 1

## 2017-07-10 MED ORDER — FENTANYL CITRATE (PF) 250 MCG/5ML IJ SOLN
INTRAMUSCULAR | Status: DC | PRN
  Administered 2017-07-10 (×2): 25 ug via INTRAVENOUS

## 2017-07-10 SURGICAL SUPPLY — 43 items
ADH SKN CLS APL DERMABOND .7 (GAUZE/BANDAGES/DRESSINGS) ×1
BANDAGE ACE 3X5.8 VEL STRL LF (GAUZE/BANDAGES/DRESSINGS) ×2 IMPLANT
BLADE SURG 15 STRL LF DISP TIS (BLADE) ×1 IMPLANT
BLADE SURG 15 STRL SS (BLADE) ×4
BNDG CMPR 75X41 PLY ABS (GAUZE/BANDAGES/DRESSINGS) ×1
BNDG GAUZE ELAST 4 BULKY (GAUZE/BANDAGES/DRESSINGS) ×1 IMPLANT
BNDG STRETCH 4X75 NS LF (GAUZE/BANDAGES/DRESSINGS) ×2 IMPLANT
CABLE BIPOLOR RESECTION CORD (MISCELLANEOUS) ×2 IMPLANT
DECANTER SPIKE VIAL GLASS SM (MISCELLANEOUS) ×2 IMPLANT
DERMABOND ADVANCED (GAUZE/BANDAGES/DRESSINGS) ×1
DERMABOND ADVANCED .7 DNX12 (GAUZE/BANDAGES/DRESSINGS) ×1 IMPLANT
DRAPE EXTREMITY T 121X128X90 (DRAPE) ×2 IMPLANT
DRAPE HALF SHEET 40X57 (DRAPES) ×4 IMPLANT
DURAPREP 26ML APPLICATOR (WOUND CARE) ×2 IMPLANT
GAUZE SPONGE 4X4 12PLY STRL (GAUZE/BANDAGES/DRESSINGS) ×2 IMPLANT
GAUZE SPONGE 4X4 12PLY STRL LF (GAUZE/BANDAGES/DRESSINGS) ×1 IMPLANT
GAUZE SPONGE 4X4 16PLY XRAY LF (GAUZE/BANDAGES/DRESSINGS) ×2 IMPLANT
GLOVE BIOGEL PI IND STRL 6.5 (GLOVE) IMPLANT
GLOVE BIOGEL PI IND STRL 7.0 (GLOVE) ×1 IMPLANT
GLOVE BIOGEL PI INDICATOR 6.5 (GLOVE) ×1
GLOVE BIOGEL PI INDICATOR 7.0 (GLOVE) ×1
GLOVE ECLIPSE 6.5 STRL STRAW (GLOVE) ×2 IMPLANT
GLOVE SURG SS PI 6.0 STRL IVOR (GLOVE) ×2 IMPLANT
GOWN STRL REUS W/ TWL LRG LVL3 (GOWN DISPOSABLE) ×2 IMPLANT
GOWN STRL REUS W/ TWL XL LVL3 (GOWN DISPOSABLE) IMPLANT
GOWN STRL REUS W/TWL 2XL LVL3 (GOWN DISPOSABLE) IMPLANT
GOWN STRL REUS W/TWL LRG LVL3 (GOWN DISPOSABLE) ×4
GOWN STRL REUS W/TWL XL LVL3 (GOWN DISPOSABLE)
KIT BASIN OR (CUSTOM PROCEDURE TRAY) ×2 IMPLANT
KIT ROOM TURNOVER OR (KITS) ×2 IMPLANT
NEEDLE HYPO 25X1 1.5 SAFETY (NEEDLE) ×2 IMPLANT
NS IRRIG 1000ML POUR BTL (IV SOLUTION) ×2 IMPLANT
PACK SURGICAL SETUP 50X90 (CUSTOM PROCEDURE TRAY) ×2 IMPLANT
PAD ARMBOARD 7.5X6 YLW CONV (MISCELLANEOUS) ×6 IMPLANT
STOCKINETTE 4X48 STRL (DRAPES) ×2 IMPLANT
SUT ETHILON 3 0 PS 1 (SUTURE) ×2 IMPLANT
SYR BULB 3OZ (MISCELLANEOUS) ×2 IMPLANT
SYR CONTROL 10ML LL (SYRINGE) ×2 IMPLANT
TOWEL GREEN STERILE (TOWEL DISPOSABLE) ×2 IMPLANT
TOWEL GREEN STERILE FF (TOWEL DISPOSABLE) ×2 IMPLANT
TUBE CONNECTING 12X1/4 (SUCTIONS) ×2 IMPLANT
UNDERPAD 30X30 (UNDERPADS AND DIAPERS) ×2 IMPLANT
WATER STERILE IRR 1000ML POUR (IV SOLUTION) ×2 IMPLANT

## 2017-07-10 NOTE — Anesthesia Procedure Notes (Signed)
Procedure Name: MAC Date/Time: 07/10/2017 8:15 AM Performed by: Lance Coon, CRNA Pre-anesthesia Checklist: Patient identified, Emergency Drugs available, Suction available, Patient being monitored and Timeout performed Patient Re-evaluated:Patient Re-evaluated prior to induction Oxygen Delivery Method: Nasal cannula

## 2017-07-10 NOTE — H&P (Signed)
Cc left hand pain [name] presents with emg proven left carpal tunnel disease. She is here today for a left carpal tunnel release.  Allergies  Allergen Reactions  . Other     NO BLOOD    Past Medical History:  Diagnosis Date  . AICD (automatic cardioverter/defibrillator) present   . Arthritis   . Asthma   . Carpal tunnel syndrome, bilateral   . Diabetes mellitus   . GERD (gastroesophageal reflux disease)   . Hyperlipemia   . Hypertension   . Obesity    morbid  . Sleep apnea    wears CPAP set at 12  . Wears glasses    Past Surgical History:  Procedure Laterality Date  . ABDOMINAL HYSTERECTOMY    . BACK SURGERY    . BREAST SURGERY    . CHOLECYSTECTOMY    . DILATION AND CURETTAGE OF UTERUS    . ICD IMPLANT    . JOINT REPLACEMENT    . TONSILLECTOMY     Prior to Admission medications   Medication Sig Start Date End Date Taking? Authorizing Provider  amLODipine (NORVASC) 5 MG tablet Take 5 mg by mouth daily.   Yes [provider]  aspirin 81 MG tablet Take 81 mg by mouth daily.   Yes [provider]  B Complex Vitamins (B COMPLEX-B12) TABS Take 1 tablet by mouth daily.   Yes [provider]  carvedilol (COREG) 25 MG tablet Take 37.5 mg by mouth 2 (two) times daily with a meal.    Yes [provider]  cholecalciferol (VITAMIN D) 1000 units tablet Take 1,000 Units by mouth daily.   Yes [provider]  Cinnamon 500 MG capsule Take 500 mg by mouth daily.   Yes [provider]  hydrALAZINE (APRESOLINE) 50 MG tablet Take 50 mg by mouth 2 (two) times daily.   Yes [provider]  ibuprofen (ADVIL,MOTRIN) 600 MG tablet Take 600 mg by mouth 3 (three) times daily.   Yes [provider]  insulin NPH-regular Human (NOVOLIN 70/30) (70-30) 100 UNIT/ML injection Inject 20-50 Units into the skin See admin instructions. 20 units in the morning, and 50 units at bedtime   Yes [provider]  metFORMIN  (GLUCOPHAGE-XR) 500 MG 24 hr tablet Take 500 mg by mouth 2 (two) times daily.   Yes [provider]  Omega-3 Fatty Acids (FISH OIL) 1000 MG CAPS Take 1 capsule by mouth.   Yes [provider]  pravastatin (PRAVACHOL) 40 MG tablet Take 40 mg by mouth every evening.   Yes [provider]  sitaGLIPtin (JANUVIA) 100 MG tablet Take 100 mg by mouth daily.   Yes [provider]  spironolactone (ALDACTONE) 50 MG tablet Take 50 mg by mouth every morning.   Yes [provider]  valsartan-hydrochlorothiazide (DIOVAN-HCT) 160-25 MG tablet Take 1 tablet by mouth daily.   Yes [provider]  albuterol (PROVENTIL HFA;VENTOLIN HFA) 108 (90 Base) MCG/ACT inhaler Inhale 1 puff into the lungs every 4 (four) hours as needed for wheezing or shortness of breath.    [provider]  Trolamine Salicylate (ASPERCREME) 10 % LOTN Apply 1 application topically 2 (two) times daily as needed (pain).    [provider]   Family History  Problem Relation Age of Onset  . Hypertension Mother   . Stroke Mother   . Heart disease Father   . Heart attack Sister   . Heart attack Brother    Social History  Socioeconomic History  . Marital status: Married    Spouse name: Not on file  . Number of children: Not on file  . Years of education: Not on file  . Highest education level: Not on file  Social Needs  . Financial resource strain: Not on file  . Food insecurity - worry: Not on file  . Food insecurity - inability: Not on file  . Transportation needs - medical: Not on file  . Transportation needs - non-medical: Not on file  Occupational History  . Not on file  Tobacco Use  . Smoking status: Never Smoker  . Smokeless tobacco: Never Used  Substance and Sexual Activity  . Alcohol use: No  . Drug use: No  . Sexual activity: No  Other Topics Concern  . Not on file  Social History Narrative  . Not on file   Physical Exam  Constitutional: She  is oriented to person, place, and time. She appears well-developed and well-nourished.  HENT:  Head: Normocephalic and atraumatic.  Right Ear: External ear normal.  Left Ear: External ear normal.  Nose: Nose normal.  Mouth/Throat: Oropharynx is clear and moist.  Eyes: EOM are normal. Pupils are equal, round, and reactive to light.  Neck: Normal range of motion. Neck supple.  Cardiovascular: Normal rate and regular rhythm.  Pulmonary/Chest: Effort normal and breath sounds normal.  Abdominal: Soft. Bowel sounds are normal.  Neurological: She is alert and oriented to person, place, and time. She displays normal reflexes. A sensory deficit is present. No cranial nerve deficit. She exhibits normal muscle tone. Coordination normal.  Skin: Skin is warm and dry.  Psychiatric: She has a normal mood and affect. Her behavior is normal. Judgment and thought content normal.    Admit for left carpal tunnel release. Bleeding, infection, damage to her nerve causing weakness in the hand, loss of function, and other risks discussed. She understands and wishes to proceed

## 2017-07-10 NOTE — Op Note (Signed)
   8:58 AM  PATIENT:  Yvonne Miller  67 y.o. female with severe carpal tunnel syndrome. She has elected for operative decompression of the left median nerve.  PRE-OPERATIVE DIAGNOSIS:  left Carpal Tunnel Syndrome  POST-OPERATIVE DIAGNOSIS:  left Carpal Tunnel Syndrome  PROCEDURE:  Procedure(s):Left CARPAL TUNNEL RELEASE  SURGEON: Surgeon(s): Ashok Pall, MD  ANESTHESIA:   local and IV sedation  EBL:  Total I/O In: 1500 [I.V.:1500] Out: 5 [Blood:5]  COUNT:correct  DICTATION: Yvonne Miller was taken to the operating room, given IV sedation, and positioned on the operating room table. female had their left upper extremity prepped and draped in a sterile manner. I infiltrated Licodcaine  4/2% 3/536,144 strength epinephrine into the planned incision starting at the proximal palmar crease extending into the hand ~ 1.5cm. I opened the skin with a 15 blade and extended the incision through the skin into the subcutaneous tissue. I used the bipolar cautery to control the subcutaneous bleeding. I dissected sharply through the tissue using forceps also to expose the transverse carpal ligament. I divided the transverse carpal ligament sharply with the 15 blade using the forceps to protect the contents of the carpal tunnel. With the scissors I divided the ligament both proximally and distally to decompress the entire carpal tunnel.  I also since the ligament was so calcified had to use a Kerrison punch to divide the ligament.I used the scissors to dissect into the forearm to create space to divide the ligament to the proximal palmar crease, and distally into the palm.  I irrigated the wound then closed the incision with vertical interrupted vertical mattress sutures. I placed a sterile dressing, then wrapped the proximal hand and distal forearm with an ace wrap.  PLAN OF CARE: Discharge to home after PACU  PATIENT DISPOSITION:  PACU - hemodynamically stable.   Delay start of Pharmacological VTE  agent (>24hrs) due to surgical blood loss or risk of bleeding:  yes

## 2017-07-10 NOTE — Transfer of Care (Signed)
Immediate Anesthesia Transfer of Care Note  Patient: Yvonne Miller  Procedure(s) Performed: CARPAL TUNNEL RELEASE (Left Wrist)  Patient Location: PACU  Anesthesia Type:MAC  Level of Consciousness: awake and patient cooperative  Airway & Oxygen Therapy: Patient Spontanous Breathing  Post-op Assessment: Report given to RN and Post -op Vital signs reviewed and stable  Post vital signs: Reviewed and stable  Last Vitals:  Vitals:   07/10/17 0615  BP: (!) 112/59  Pulse: 73  Resp: 20  Temp: 37 C  SpO2: 100%    Last Pain:  Vitals:   07/10/17 0655  TempSrc:   PainSc: 6       Patients Stated Pain Goal: 2 (47/42/59 [id number])  Complications: No apparent anesthesia complications

## 2017-07-10 NOTE — Anesthesia Postprocedure Evaluation (Signed)
Anesthesia Post Note  Patient: Yvonne Miller  Procedure(s) Performed: CARPAL TUNNEL RELEASE (Left Wrist)     Patient location during evaluation: PACU Anesthesia Type: MAC Level of consciousness: awake and alert, patient cooperative and oriented Pain management: pain level controlled Vital Signs Assessment: post-procedure vital signs reviewed and stable Respiratory status: spontaneous breathing, nonlabored ventilation and respiratory function stable Cardiovascular status: blood pressure returned to baseline and stable Postop Assessment: no apparent nausea or vomiting Anesthetic complications: no    Last Vitals:  Vitals:   07/10/17 0940 07/10/17 0956  BP:  104/85  Pulse: 68 67  Resp: 17 16  Temp: 36.7 C   SpO2: 97% 98%    Last Pain:  Vitals:   07/10/17 0940  TempSrc:   PainSc: 2                  Jahmez Bily,E. Neveyah Garzon

## 2017-07-10 NOTE — Discharge Summary (Signed)
Physician Discharge Summary  Patient ID: TRILBY WAY MRN: [id number] DOB/AGE: [birth year] 67 y.o.  Admit date: 07/10/2017 Discharge date: 07/10/2017  Admission Diagnoses:left carpal tunnel syndrome  Discharge Diagnoses: same Active Problems:   * No active hospital problems. *   Discharged Condition: good  Hospital Course: Mrs. Keeny was admitted and taken to the operating room for an uncomplicated carpal tunnel release. She did well and will be discharged from the pacu, moving all digits in the left hand.  Treatments: surgery: Left Carpal tunnel release  Discharge Exam: Blood pressure (P) 123/65, pulse (P) 68, temperature (!) (P) 97 F (36.1 C), resp. rate (!) (P) 24, SpO2 (P) 95 %. General appearance: alert, cooperative and appears stated age Neurologic: Alert and oriented X 3, normal strength and tone. Normal symmetric reflexes. Normal coordination and gait  Disposition: 01-Home or Self Care CARPAL TUNNEL SYNDROME- LEFT   Follow-up Information    Ashok Pall, MD In 10 days.   Specialty:  Neurosurgery Why:  For suture removal, please call the office to make an appointment Contact information: [address] [place] [address] [phone]           Signed: Winfield Cunas 07/10/2017, 9:06 AM

## 2017-07-10 NOTE — Discharge Instructions (Addendum)

## 2017-07-11 ENCOUNTER — Encounter (HOSPITAL_COMMUNITY): Payer: Self-pay | Admitting: Neurosurgery

## 2017-07-11 LAB — HEMOGLOBIN A1C
Hgb A1c MFr Bld: 7.8 % — ABNORMAL HIGH (ref 4.8–5.6)
Mean Plasma Glucose: 177 mg/dL

## 2017-08-04 ENCOUNTER — Encounter (INDEPENDENT_AMBULATORY_CARE_PROVIDER_SITE_OTHER): Payer: Self-pay | Admitting: Orthopaedic Surgery

## 2017-08-04 ENCOUNTER — Ambulatory Visit (INDEPENDENT_AMBULATORY_CARE_PROVIDER_SITE_OTHER): Payer: Medicare Other | Admitting: Orthopaedic Surgery

## 2017-08-04 ENCOUNTER — Ambulatory Visit (INDEPENDENT_AMBULATORY_CARE_PROVIDER_SITE_OTHER): Payer: Medicare Other

## 2017-08-04 DIAGNOSIS — M25512 Pain in left shoulder: Secondary | ICD-10-CM

## 2017-08-04 DIAGNOSIS — M1712 Unilateral primary osteoarthritis, left knee: Secondary | ICD-10-CM | POA: Diagnosis not present

## 2017-08-04 DIAGNOSIS — G8929 Other chronic pain: Secondary | ICD-10-CM | POA: Diagnosis not present

## 2017-08-04 DIAGNOSIS — M25511 Pain in right shoulder: Secondary | ICD-10-CM | POA: Diagnosis not present

## 2017-08-04 MED ORDER — METHYLPREDNISOLONE ACETATE 40 MG/ML IJ SUSP
40.0000 mg | INTRAMUSCULAR | Status: AC | PRN
  Administered 2017-08-04: 40 mg via INTRA_ARTICULAR

## 2017-08-04 MED ORDER — LIDOCAINE HCL 1 % IJ SOLN
3.0000 mL | INTRAMUSCULAR | Status: AC | PRN
  Administered 2017-08-04: 3 mL

## 2017-08-04 NOTE — Progress Notes (Addendum)
Office Visit Note   Patient: Yvonne Miller           Date of Birth: 1950/05/20           MRN: [id number] Visit Date: 08/04/2017              Requested by: [name], [address] [place], [place] [address] PCP: [name], MD   Assessment & Plan: Visit Diagnoses:  1. Chronic pain of both shoulders   2. Primary osteoarthritis of left knee     Plan: She is given handouts for shoulder exercises and Thera-Band to perform these exercises.  Each of the exercises are gone over with the patient today.  In regards to the knee will see her back in 2 weeks to check her response to the cortisone injection.  She may benefit from supplemental injection though it did not work well with the right knee.  If her shoulders continue to bother her with all for her a subacromial injection.  Questions were encouraged and answered by Dr. Ninfa Linden  And myself.  Follow-Up Instructions: Return in about 2 weeks (around 08/18/2017).   Orders:  Orders Placed This Encounter  Procedures  . Large Joint Inj  . XR Knee 1-2 Views Left  . XR Shoulder Right  . XR Shoulder Left   No orders of the defined types were placed in this encounter.     Procedures: Large Joint Inj: L knee on 08/04/2017 10:08 AM Indications: pain Details: 22 G 1.5 in needle, anterolateral approach  Arthrogram: No  Medications: 3 mL lidocaine 1 %; 40 mg methylPREDNISolone acetate 40 MG/ML Outcome: tolerated well, no immediate complications Procedure, treatment alternatives, risks and benefits explained, specific risks discussed. Consent was given by the patient. Immediately prior to procedure a time out was called to verify the correct patient, procedure, equipment, support staff and site/side marked as required. Patient was prepped and draped in the usual sterile fashion.       Clinical Data: No additional findings.   Subjective: Chief Complaint  Patient presents with  . Left Knee - Pain    HPI Mrs.  Swantek comes in today due to left knee pain that is been worse over the past 4 weeks.  No known injury.  Knee gives way on her locks and painful popping.  She is ambulating with a cane to offload the left knee.  She is also having bilateral shoulder pain which is been ongoing for about 4 weeks the left greater than right.  No radicular symptoms down either arm however she did have some radicular symptoms in the left arm but this was she feels related to her left carpal tunnel syndrome which she had a release of home therapy in February of this year and is still recovering from.  Said no fevers chills shortness of breath chest pain.  She has taken some Motrin which helps with her knee and shoulders.  She is diabetic last hemoglobin A1c was 7.4.  She had a pacemaker placed since she was last seen by Dr. Ninfa Linden in 2012. Review of Systems Please see HPI otherwise negative  Objective: Vital Signs: Height 5 foot 4 weight 260 pounds BMI 44.6  Physical Exam  Constitutional: She is oriented to person, place, and time. She appears well-developed and well-nourished. No distress.  Pulmonary/Chest: Effort normal.  Neurological: She is alert and oriented to person, place, and time.  Skin: She is not diaphoretic.  Psychiatric: She has a normal mood and  affect.    Ortho Exam Bilateral shoulders positive impingement bilaterally.  She has decreased forward flexion of both shoulders actively coming to approximately 170 degrees.  Out of 5 strength with external and internal rotation against resistance and empty can test is negative bilaterally. Left knee she has good range of motion with full extension and flexion.  No instability valgus varus stressing.  Tenderness over the medial joint line.  Left calf supple nontender.  Right knee well-healed surgical incision from total knee arthroplasty with good range of motion.  Specialty Comments:  No specialty comments available.  Imaging: Xr Knee 1-2 Views  Left  Result Date: 08/04/2017 Left knee 2 views: No acute fracture.  Bone-on-bone medial compartment severe patellofemoral joint arthritic changes.  Right knee is seen on the AP view and components from total knee arthroplasty.  All well seated.  Xr Shoulder Left  Result Date: 08/04/2017 Left shoulder 3 views: Shoulders well located.  No acute fractures.  Subacromial space well maintained.  No significant arthritic changes of the AC joint.  Glenohumeral joint is well-maintained.  Xr Shoulder Right  Result Date: 08/04/2017 Right shoulder 3 views: Shoulders well located.  No acute fractures.  Subacromial space well maintained.  AC joint well-maintained.    PMFS History: There are no active problems to display for this patient.  Past Medical History:  Diagnosis Date  . AICD (automatic cardioverter/defibrillator) present   . Arthritis   . Asthma   . Carpal tunnel syndrome, bilateral   . Diabetes mellitus   . GERD (gastroesophageal reflux disease)   . Hyperlipemia   . Hypertension   . Obesity    morbid  . Sleep apnea    wears CPAP set at 11  . Wears glasses     Family History  Problem Relation Age of Onset  . Hypertension Mother   . Stroke Mother   . Heart disease Father   . Heart attack Sister   . Heart attack Brother     Past Surgical History:  Procedure Laterality Date  . ABDOMINAL HYSTERECTOMY    . BACK SURGERY    . BREAST SURGERY    . CARPAL TUNNEL RELEASE Left 07/10/2017   Procedure: CARPAL TUNNEL RELEASE;  Surgeon: Ashok Pall, MD;  Location: [facility];  Service: Neurosurgery;  Laterality: Left;  left  . CHOLECYSTECTOMY    . DILATION AND CURETTAGE OF UTERUS    . ICD IMPLANT    . JOINT REPLACEMENT    . TONSILLECTOMY     Social History   Occupational History  . Not on file  Tobacco Use  . Smoking status: Never Smoker  . Smokeless tobacco: Never Used  Substance and Sexual Activity  . Alcohol use: No  . Drug use: No  . Sexual activity: Never

## 2017-08-18 ENCOUNTER — Encounter (INDEPENDENT_AMBULATORY_CARE_PROVIDER_SITE_OTHER): Payer: Self-pay | Admitting: Orthopaedic Surgery

## 2017-08-18 ENCOUNTER — Ambulatory Visit (INDEPENDENT_AMBULATORY_CARE_PROVIDER_SITE_OTHER): Payer: Medicare Other | Admitting: Orthopaedic Surgery

## 2017-08-18 DIAGNOSIS — M25562 Pain in left knee: Secondary | ICD-10-CM | POA: Diagnosis not present

## 2017-08-18 DIAGNOSIS — M1712 Unilateral primary osteoarthritis, left knee: Secondary | ICD-10-CM

## 2017-08-18 DIAGNOSIS — G8929 Other chronic pain: Secondary | ICD-10-CM

## 2017-08-18 NOTE — Progress Notes (Signed)
Patient comes in today with continued left knee pain.  She has known and well-documented severe arthritis of the left knee mainly involving the medial compartment.  There is varus malalignment of of the knee as well.  We went over her left knee x-rays again today.  Her pain is daily and it is detrimentally affected x-rays daily living, quality of life, mobility.  She is 67 years old.  She is been using a cane as well.  We actually performed a right total knee arthroplasty on her 13 years ago and that is done well.  2 weeks ago we placed a steroid injection in her left knee.  She is also had a left knee arthroscopy with  On examination of her left knee there is no effusion today but she has varus malalignment with significant medial joint line tenderness and patellofemoral crepitation.  At this point she would like to at least try hyaluronic acid for the left knee and I agree with her trying this.  She will continue work on quad strengthening exercises.  The only other option would be considering knee replacement surgery if it gets to that point where all of the conservative treatment measures fail.

## 2017-08-19 ENCOUNTER — Telehealth (INDEPENDENT_AMBULATORY_CARE_PROVIDER_SITE_OTHER): Payer: Self-pay

## 2017-08-19 NOTE — Telephone Encounter (Signed)
Submitted application online for Monovisc injection, left knee. 

## 2017-08-27 ENCOUNTER — Telehealth (INDEPENDENT_AMBULATORY_CARE_PROVIDER_SITE_OTHER): Payer: Self-pay

## 2017-08-27 NOTE — Telephone Encounter (Signed)
Talked with Ana A. With [facility] and initiated PA for Monovisc injection, left knee.  Pending Authorization# [id number]

## 2017-08-31 ENCOUNTER — Telehealth (INDEPENDENT_AMBULATORY_CARE_PROVIDER_SITE_OTHER): Payer: Self-pay

## 2017-08-31 NOTE — Telephone Encounter (Signed)
Talked with Yvonne Miller with [facility] to have authorization expedited and she stated that it would take at least 72 hrs before a decision is made to approve [id number] for Monovisc injection, left knee.   Pending Auth.# is [id number].  Talked with patient and advised her of message above.  Will call patient to reschedule appt.when we have received an approval from Yvonne Miller.

## 2017-09-01 ENCOUNTER — Ambulatory Visit (INDEPENDENT_AMBULATORY_CARE_PROVIDER_SITE_OTHER): Payer: Medicare Other | Admitting: Orthopaedic Surgery

## 2017-09-03 ENCOUNTER — Encounter (INDEPENDENT_AMBULATORY_CARE_PROVIDER_SITE_OTHER): Payer: Self-pay | Admitting: Orthopaedic Surgery

## 2017-09-03 ENCOUNTER — Ambulatory Visit (INDEPENDENT_AMBULATORY_CARE_PROVIDER_SITE_OTHER): Payer: Medicare Other | Admitting: Orthopaedic Surgery

## 2017-09-03 DIAGNOSIS — M1712 Unilateral primary osteoarthritis, left knee: Secondary | ICD-10-CM

## 2017-09-03 DIAGNOSIS — M25511 Pain in right shoulder: Secondary | ICD-10-CM | POA: Diagnosis not present

## 2017-09-03 MED ORDER — METHYLPREDNISOLONE ACETATE 40 MG/ML IJ SUSP
40.0000 mg | INTRAMUSCULAR | Status: AC | PRN
  Administered 2017-09-03: 40 mg via INTRA_ARTICULAR

## 2017-09-03 MED ORDER — LIDOCAINE HCL 1 % IJ SOLN
3.0000 mL | INTRAMUSCULAR | Status: AC | PRN
  Administered 2017-09-03: 3 mL

## 2017-09-03 MED ORDER — HYALURONAN 88 MG/4ML IX SOSY
88.0000 mg | PREFILLED_SYRINGE | INTRA_ARTICULAR | Status: AC | PRN
  Administered 2017-09-03: 88 mg via INTRA_ARTICULAR

## 2017-09-03 NOTE — Progress Notes (Signed)
Office Visit Note   Patient: Yvonne Miller           Date of Birth: May 25, 1950           MRN: [id number] Visit Date: 09/03/2017              Requested by: [name], [address] [place], [place] [address] PCP: [name], MD   Assessment & Plan: Visit Diagnoses:  1. Unilateral primary osteoarthritis, left knee   2. Acute pain of right shoulder     Plan: Replaced the Monovisc injection in the left knee without difficulty.  She knows the next step would be a knee replacement if this does not work but we also can provide a steroid injection down the road if needed in her left knee.  We did place a steroid injection of the right shoulder without difficulty.  All questions concerns were answered and addressed.  She will follow-up as needed unless things worsen in any way.  Follow-Up Instructions: Return if symptoms worsen or fail to improve.   Orders:  Orders Placed This Encounter  Procedures  . Large Joint Inj  . Large Joint Inj   No orders of the defined types were placed in this encounter.     Procedures: Large Joint Inj: L knee on 09/03/2017 1:16 PM Indications: diagnostic evaluation and pain Details: 22 G 1.5 in needle, superolateral approach  Arthrogram: No  Medications: 3 mL lidocaine 1 %; 88 mg Hyaluronan 88 MG/4ML Outcome: tolerated well, no immediate complications Procedure, treatment alternatives, risks and benefits explained, specific risks discussed. Consent was given by the patient. Immediately prior to procedure a time out was called to verify the correct patient, procedure, equipment, support staff and site/side marked as required. Patient was prepped and draped in the usual sterile fashion.   Large Joint Inj: R subacromial bursa on 09/03/2017 1:25 PM Indications: pain and diagnostic evaluation Details: 22 G 1.5 in needle  Arthrogram: No  Medications: 3 mL lidocaine 1 %; 40 mg methylPREDNISolone acetate 40 MG/ML Outcome: tolerated  well, no immediate complications Procedure, treatment alternatives, risks and benefits explained, specific risks discussed. Consent was given by the patient. Immediately prior to procedure a time out was called to verify the correct patient, procedure, equipment, support staff and site/side marked as required. Patient was prepped and draped in the usual sterile fashion.       Clinical Data: No additional findings.   Subjective: Chief Complaint  Patient presents with  . Left Knee - Follow-up   The patient is well-known to me.  She was coming in today just for a scheduled hyaluronic acid injection into her right knee to treat pain from moderate osteoarthritis.  She is also having some right shoulder pain and would like a steroid injection right shoulder.  She says she hurt her shoulder reaching for something away from her and does feel that shoulder injection is warranted with a steroid she is definitely asking for that today. HPI  Review of Systems   Objective: Vital Signs: There were no vitals taken for this visit.  Physical Exam She is alert and oriented no acute distress Ortho Exam Examination of her left knee shows no effusion.  There is slight varus malalignment with good range of motion.  She has well-healed surgical incisions from previous arthroscopic surgery.  Examination of her right shoulder shows pain on motion but no deficits. Specialty Comments:  No specialty comments available.  Imaging: No results found.  PMFS History: Patient Active Problem List   Diagnosis Date Noted  . Unilateral primary osteoarthritis, left knee 08/18/2017   Past Medical History:  Diagnosis Date  . AICD (automatic cardioverter/defibrillator) present   . Arthritis   . Asthma   . Carpal tunnel syndrome, bilateral   . Diabetes mellitus   . GERD (gastroesophageal reflux disease)   . Hyperlipemia   . Hypertension   . Obesity    morbid  . Sleep apnea    wears CPAP set at 11  .  Wears glasses     Family History  Problem Relation Age of Onset  . Hypertension Mother   . Stroke Mother   . Heart disease Father   . Heart attack Sister   . Heart attack Brother     Past Surgical History:  Procedure Laterality Date  . ABDOMINAL HYSTERECTOMY    . BACK SURGERY    . BREAST SURGERY    . CARPAL TUNNEL RELEASE Left 07/10/2017   Procedure: CARPAL TUNNEL RELEASE;  Surgeon: Ashok Pall, MD;  Location: [facility];  Service: Neurosurgery;  Laterality: Left;  left  . CHOLECYSTECTOMY    . DILATION AND CURETTAGE OF UTERUS    . ICD IMPLANT    . JOINT REPLACEMENT    . TONSILLECTOMY     Social History   Occupational History  . Not on file  Tobacco Use  . Smoking status: Never Smoker  . Smokeless tobacco: Never Used  Substance and Sexual Activity  . Alcohol use: No  . Drug use: No  . Sexual activity: Never

## 2017-09-10 DIAGNOSIS — H40023 Open angle with borderline findings, high risk, bilateral: Secondary | ICD-10-CM | POA: Diagnosis not present

## 2017-09-10 DIAGNOSIS — E119 Type 2 diabetes mellitus without complications: Secondary | ICD-10-CM | POA: Diagnosis not present

## 2017-09-17 DIAGNOSIS — K7581 Nonalcoholic steatohepatitis (NASH): Secondary | ICD-10-CM | POA: Diagnosis not present

## 2017-09-17 DIAGNOSIS — Z780 Asymptomatic menopausal state: Secondary | ICD-10-CM | POA: Diagnosis not present

## 2017-09-17 DIAGNOSIS — Z1211 Encounter for screening for malignant neoplasm of colon: Secondary | ICD-10-CM | POA: Diagnosis not present

## 2017-09-17 DIAGNOSIS — Z1231 Encounter for screening mammogram for malignant neoplasm of breast: Secondary | ICD-10-CM | POA: Diagnosis not present

## 2017-10-19 ENCOUNTER — Telehealth (INDEPENDENT_AMBULATORY_CARE_PROVIDER_SITE_OTHER): Payer: Self-pay | Admitting: Orthopaedic Surgery

## 2017-10-19 DIAGNOSIS — Z789 Other specified health status: Secondary | ICD-10-CM | POA: Diagnosis not present

## 2017-10-19 DIAGNOSIS — I10 Essential (primary) hypertension: Secondary | ICD-10-CM | POA: Diagnosis not present

## 2017-10-19 DIAGNOSIS — I428 Other cardiomyopathies: Secondary | ICD-10-CM | POA: Diagnosis not present

## 2017-10-19 NOTE — Telephone Encounter (Signed)
There is no evidence at this point that stem cells can treat severe osteoarthritis.  I have had plenty of patients that have tried this paid a lot of money for the stem cells that have not worked at all.  Her arthritis is quite severe and there is no indication that stem cells will help at all.  We usually do not have any information for this to give out the patient's and if she wants to seek this on her own I have no problem with her doing that at all.

## 2017-10-19 NOTE — Telephone Encounter (Signed)
Patient is wanting to speak with Dr. Ninfa Linden about a stem cell option? She said she is supposed to have a knee surgery sometime after summer and would like to discuss this and receive some information. Patients # [phone]

## 2017-10-19 NOTE — Telephone Encounter (Signed)
Please advise 

## 2017-10-20 ENCOUNTER — Telehealth (INDEPENDENT_AMBULATORY_CARE_PROVIDER_SITE_OTHER): Payer: Self-pay | Admitting: Radiology

## 2017-10-20 ENCOUNTER — Other Ambulatory Visit (INDEPENDENT_AMBULATORY_CARE_PROVIDER_SITE_OTHER): Payer: Self-pay | Admitting: Orthopaedic Surgery

## 2017-10-20 MED ORDER — TRAMADOL HCL 50 MG PO TABS: 50.0000 mg | ORAL_TABLET | Freq: Four times a day (QID) | ORAL | 0 refills | Status: DC | PRN

## 2017-10-20 NOTE — Progress Notes (Unsigned)
am

## 2017-10-20 NOTE — Telephone Encounter (Signed)
I sent in some Tramadol to her pharmacy for her.

## 2017-10-20 NOTE — Telephone Encounter (Signed)
I called patient and discussed. She understands.

## 2017-10-20 NOTE — Telephone Encounter (Signed)
Patient is trying to wait until September to have the TKA.  She has a lot of night pain, cannot sleep long at all.  Uses aspercreme, takes motrin and goody's powders.  Uses heat some as well.  Is there anything you can give her for her pain?

## 2017-10-29 DIAGNOSIS — I130 Hypertensive heart and chronic kidney disease with heart failure and stage 1 through stage 4 chronic kidney disease, or unspecified chronic kidney disease: Secondary | ICD-10-CM | POA: Diagnosis not present

## 2017-10-29 DIAGNOSIS — E1122 Type 2 diabetes mellitus with diabetic chronic kidney disease: Secondary | ICD-10-CM | POA: Diagnosis not present

## 2017-10-29 DIAGNOSIS — N08 Glomerular disorders in diseases classified elsewhere: Secondary | ICD-10-CM | POA: Diagnosis not present

## 2017-10-29 DIAGNOSIS — N183 Chronic kidney disease, stage 3 (moderate): Secondary | ICD-10-CM | POA: Diagnosis not present

## 2017-11-17 ENCOUNTER — Ambulatory Visit (INDEPENDENT_AMBULATORY_CARE_PROVIDER_SITE_OTHER): Payer: Medicare Other | Admitting: Orthopaedic Surgery

## 2017-11-17 ENCOUNTER — Encounter (INDEPENDENT_AMBULATORY_CARE_PROVIDER_SITE_OTHER): Payer: Self-pay | Admitting: Orthopaedic Surgery

## 2017-11-17 ENCOUNTER — Ambulatory Visit (INDEPENDENT_AMBULATORY_CARE_PROVIDER_SITE_OTHER): Payer: Medicare Other

## 2017-11-17 DIAGNOSIS — M7671 Peroneal tendinitis, right leg: Secondary | ICD-10-CM | POA: Diagnosis not present

## 2017-11-17 MED ORDER — TRAMADOL HCL 50 MG PO TABS: 50.0000 mg | ORAL_TABLET | Freq: Four times a day (QID) | ORAL | 0 refills | Status: DC | PRN

## 2017-11-17 MED ORDER — DICLOFENAC SODIUM 1 % TD GEL: 4.0000 g | Freq: Four times a day (QID) | TRANSDERMAL | 1 refills | Status: DC

## 2017-11-17 NOTE — Progress Notes (Signed)
Office Visit Note   Patient: Yvonne Miller           Date of Birth: 28-Sep-1950           MRN: [id number] Visit Date: 11/17/2017              Requested by: [name], [address] [place], [place] [address] PCP: [name], MD   Assessment & Plan: Visit Diagnoses:  1. Peroneal tendinitis, right     Plan: She is placed in a cam walker boot weightbearing as tolerated on the right.  Concerns are to formal physical therapy for the right peroneal tendinitis for modalities eccentric exercises peroneal tendon home exercise program.  She will begin applying Voltaren gel 4 g 4 times daily to the left knee and to her right ankle over the peroneal tendons.  She is also given tramadol for pain.  See her back in 4 weeks to check her progress lack of.  Questions encouraged and answered by Dr. Ninfa Linden and myself.  We will discuss setting her up for a left total knee arthroplasty return visit.  Follow-Up Instructions: Return in about 1 month (around 12/15/2017).   Orders:  Orders Placed This Encounter  Procedures  . XR Ankle Complete Right   Meds ordered this encounter  Medications  . diclofenac sodium (VOLTAREN) 1 % GEL    Sig: Apply 4 g topically 4 (four) times daily.    Dispense:  1 Tube    Refill:  1  . traMADol (ULTRAM) 50 MG tablet    Sig: Take 1-2 tablets (50-100 mg total) by mouth every 6 (six) hours as needed.    Dispense:  50 tablet    Refill:  0      Procedures: No procedures performed   Clinical Data: No additional findings.   Subjective: Chief Complaint  Patient presents with  . Right Ankle - Pain    HPI Yvonne Miller  is well-known to Dr. Ninfa Linden service has severe arthritis for the left knee.  Comes in today with a new complaint of right ankle pain that began this past Saturday.  She had no injury to the ankle.  She states she is having swelling of the lateral aspect of the ankle.  7 pain lateral aspect the ankle.  The swelling is resolving.   She does feel like the ankle gives way no locking catching painful popping.  She did take some ibuprofen this is helped some with the lateral ankle pain.  She she notes she is having swelling in both feet having to wear flip-flops.  She is wanting to set up left knee replacement for later this fall.  She is having severe pain in the knee and she feels that this may be altering her gait some.  She is having difficulty sleeping due to the knee pain ankle pain. Review of Systems Please see HPI otherwise  negative  Objective: Vital Signs: There were no vitals taken for this visit.  Physical Exam  Constitutional: She is oriented to person, place, and time. She appears well-developed and well-nourished. No distress.  Pulmonary/Chest: Effort normal.  Neurological: She is alert and oriented to person, place, and time.  Skin: She is not diaphoretic.  Psychiatric: She has a normal mood and affect.    Ortho Exam Left ankle compared to the right shows lateral right ankle swelling.  There is no erythema no ecchymosis.  She is tender over the peroneal tendon just posterior to the lateral malleolus  and down to the insertion of the peroneus brevis.  She has 5 out of 5 strength with inversion eversion against resistance bilateral feet.  However on the right with extreme inversion she has pain lateral aspect of the right ankle.  Eversion against resistance the right ankle causes pain lateral ankle.  She has good dorsiflexion plantar flexion without pain.  Nontender over the Achilles of the right ankle Achilles is intact.  Right calf supple nontender.  Nontender over the posterior tibial tendon right ankle. Specialty Comments:  No specialty comments available.  Imaging: Xr Ankle Complete Right  Result Date: 11/17/2017 Right ankle 3 views: No acute fracture.  Talus well located within the ankle mortise no diastases.  She has posterior spur off of the tibia.  No bony abnormalities otherwise.    PMFS  History: Patient Active Problem List   Diagnosis Date Noted  . Unilateral primary osteoarthritis, left knee 08/18/2017   Past Medical History:  Diagnosis Date  . AICD (automatic cardioverter/defibrillator) present   . Arthritis   . Asthma   . Carpal tunnel syndrome, bilateral   . Diabetes mellitus   . GERD (gastroesophageal reflux disease)   . Hyperlipemia   . Hypertension   . Obesity    morbid  . Sleep apnea    wears CPAP set at 11  . Wears glasses     Family History  Problem Relation Age of Onset  . Hypertension Mother   . Stroke Mother   . Heart disease Father   . Heart attack Sister   . Heart attack Brother     Past Surgical History:  Procedure Laterality Date  . ABDOMINAL HYSTERECTOMY    . BACK SURGERY    . BREAST SURGERY    . CARPAL TUNNEL RELEASE Left 07/10/2017   Procedure: CARPAL TUNNEL RELEASE;  Surgeon: Ashok Pall, MD;  Location: [facility];  Service: Neurosurgery;  Laterality: Left;  left  . CHOLECYSTECTOMY    . DILATION AND CURETTAGE OF UTERUS    . ICD IMPLANT    . JOINT REPLACEMENT    . TONSILLECTOMY     Social History   Occupational History  . Not on file  Tobacco Use  . Smoking status: Never Smoker  . Smokeless tobacco: Never Used  Substance and Sexual Activity  . Alcohol use: No  . Drug use: No  . Sexual activity: Never

## 2017-11-18 ENCOUNTER — Other Ambulatory Visit (INDEPENDENT_AMBULATORY_CARE_PROVIDER_SITE_OTHER): Payer: Self-pay

## 2017-11-18 DIAGNOSIS — M7671 Peroneal tendinitis, right leg: Secondary | ICD-10-CM

## 2017-12-16 ENCOUNTER — Encounter (INDEPENDENT_AMBULATORY_CARE_PROVIDER_SITE_OTHER): Payer: Self-pay | Admitting: Orthopaedic Surgery

## 2017-12-16 ENCOUNTER — Ambulatory Visit (INDEPENDENT_AMBULATORY_CARE_PROVIDER_SITE_OTHER): Payer: Medicare Other | Admitting: Orthopaedic Surgery

## 2017-12-16 DIAGNOSIS — M7671 Peroneal tendinitis, right leg: Secondary | ICD-10-CM

## 2017-12-16 DIAGNOSIS — M1712 Unilateral primary osteoarthritis, left knee: Secondary | ICD-10-CM | POA: Diagnosis not present

## 2017-12-16 NOTE — Progress Notes (Signed)
HPI: [name] returns today follow-up 1 month of her right ankle peroneal tendinitis.  She states the boot definitely help.  Diclofenac helped.  She did not go to any therapy.  She is ready to discuss left knee arthroplasty.  She states the pain in the knee is limiting her quality of life.  She has bone-on-bone arthritis in the medial compartment and severe patellofemoral joint arthritic changes.  She is tried conservative treatment which is included medications, injections including hyaluronic acid, quad strengthening and an assistive device being a cane.  Physical exam: General well-developed well-nourished female no acute distress mood and affect appropriate.   Psych alert and oriented x3 Ortho: Right ankle good range of motion without pain.  She has some minimal edema about the ankle.  No tenderness over the peroneal tendons.  She has 5 out of 5 strength with eversion against resistance without pain.  Right calf supple nontender.   Right knee she has well-healed surgical scar from previous total knee arthroplasty. Left knee full extension full flexion.  Positive crepitus passive range of motion.  Range of motion is painful.  She has tenderness along medial joint line.  Left calf supple nontender.  Impression: Right peroneal tendinitis resolved Left knee end-stage osteoarthritis  Plan: Due to the fact that she is failed conservative treatment as outlined above and is having pain that is interfering with her life recommend left total knee arthroplasty.  Risk including but not limited to: Pain, wound healing issues, DVT/PE, infection and nerve and vessel injury all reviewed with patient.  She does have a pacemaker and will need cardiac clearance.  We will proceed with surgery in the near future.  She will follow-up 2 weeks postop.

## 2017-12-30 ENCOUNTER — Other Ambulatory Visit (INDEPENDENT_AMBULATORY_CARE_PROVIDER_SITE_OTHER): Payer: Self-pay

## 2018-01-14 NOTE — Progress Notes (Addendum)
PCP: [name], MD  Cardiologist:Jagadeesh Einar Gip, MD  EKG: 06/02/17 in EPIC  Stress test: pt denies in the past 5 years  ECHO: 07/06/14 -see note from 07/09/17- Willeen Cass, NP in [facility]  Cardiac Cath: 02/05/11 -see note from 07/09/17 in Epic   Chest x-ray: denies past year, no recent respiratory infections/complications  Pt has Biventrciular ICD [employer] managed by Murtis Sink at [facility].  Faxed device orders to [facility] and placed call to [employer], see progress note

## 2018-01-14 NOTE — Pre-Procedure Instructions (Signed)
Yvonne Miller  01/14/2018      [id number] - [place], [place] - [address] AT [address]OSS [address] [place] [address] Phone: [phone] Fax: [phone]    Your procedure is scheduled on January 26, 2018.  Report to [facility] Admitting at 1215 PM.  Call this number if you have problems the morning of surgery:  [phone]   Remember:  Do not eat or drink after midnight.    Take these medicines the morning of surgery with A SIP OF WATER  Carvedilol (coreg) Amlodipine (norvasc) Albuterol inhaler-if needed-bring inhaler with you  Follow your surgeon's instructions on when to hold/resume aspirin.  If no instructions were given call the office to determine how they would like to you take aspirin  7 days prior to surgery STOP taking any  Diclofenac (voltaren) gel, Aleve, Naproxen, Ibuprofen, Motrin, Advil, Goody's, BC's, all herbal medications, fish oil, and all vitamins   WHAT DO I DO ABOUT MY DIABETES MEDICATION?  [name] Do not take oral diabetes medicines (pills) the morning of surgery-metformin (glucophage) or sitagliptin Celesta Gentile).  . THE NIGHT BEFORE SURGERY, take 42 units of NPH insulin (70% of your normal dose).      . THE MORNING OF SURGERY, take no insulin.  . The day of surgery, do not take other diabetes injectables, including Byetta (exenatide), Bydureon (exenatide ER), Victoza (liraglutide), or Trulicity (dulaglutide).   Reviewed and Endorsed by [facility] Patient Education Committee, August 2015   How to Manage Your Diabetes Before and After Surgery  Why is it important to control my blood sugar before and after surgery? . Improving blood sugar levels before and after surgery helps healing and can limit problems. . A way of improving blood sugar control is eating a healthy diet by: o  Eating less sugar and carbohydrates o  Increasing activity/exercise o  Talking with your doctor about reaching  your blood sugar goals . High blood sugars (greater than 180 mg/dL) can raise your risk of infections and slow your recovery, so you will need to focus on controlling your diabetes during the weeks before surgery. . Make sure that the doctor who takes care of your diabetes knows about your planned surgery including the date and location.  How do I manage my blood sugar before surgery? . Check your blood sugar at least 4 times a day, starting 2 days before surgery, to make sure that the level is not too high or low. o Check your blood sugar the morning of your surgery when you wake up and every 2 hours until you get to the Short Stay unit. . If your blood sugar is less than 70 mg/dL, you will need to treat for low blood sugar: o Do not take insulin. o Treat a low blood sugar (less than 70 mg/dL) with  cup of clear juice (cranberry or apple), 4 glucose tablets, OR glucose gel. Recheck blood sugar in 15 minutes after treatment (to make sure it is greater than 70 mg/dL). If your blood sugar is not greater than 70 mg/dL on recheck, call [phone] o  for further instructions. . Report your blood sugar to the short stay nurse when you get to Short Stay.  . If you are admitted to the hospital after surgery: o Your blood sugar will be checked by the staff and you will probably be given insulin after surgery (instead of oral diabetes medicines) to make sure you have  good blood sugar levels. o The goal for blood sugar control after surgery is 80-180 mg/dL.   Do not wear jewelry, make-up or nail polish.  Do not wear lotions, powders, or perfumes, or deodorant.  Do not shave 48 hours prior to surgery.    Do not bring valuables to the hospital.  [facility] is not responsible for any belongings or valuables.  Contacts, dentures or bridgework may not be worn into surgery.  Leave your suitcase in the car.  After surgery it may be brought to your room.  For patients admitted to the hospital, discharge  time will be determined by your treatment team.  Patients discharged the day of surgery will not be allowed to drive home.    [place]- Preparing For Surgery  Before surgery, you can play an important role. Because skin is not sterile, your skin needs to be as free of germs as possible. You can reduce the number of germs on your skin by washing with CHG (chlorahexidine gluconate) Soap before surgery.  CHG is an antiseptic cleaner which kills germs and bonds with the skin to continue killing germs even after washing.    Oral Hygiene is also important to reduce your risk of infection.  Remember - BRUSH YOUR TEETH THE MORNING OF SURGERY WITH YOUR REGULAR TOOTHPASTE  Please do not use if you have an allergy to CHG or antibacterial soaps. If your skin becomes reddened/irritated stop using the CHG.  Do not shave (including legs and underarms) for at least 48 hours prior to first CHG shower. It is OK to shave your face.  Please follow these instructions carefully.   1. Shower the NIGHT BEFORE SURGERY and the MORNING OF SURGERY with CHG.   2. If you chose to wash your hair, wash your hair first as usual with your normal shampoo.  3. After you shampoo, rinse your hair and body thoroughly to remove the shampoo.  4. Use CHG as you would any other liquid soap. You can apply CHG directly to the skin and wash gently with a scrungie or a clean washcloth.   5. Apply the CHG Soap to your body ONLY FROM THE NECK DOWN.  Do not use on open wounds or open sores. Avoid contact with your eyes, ears, mouth and genitals (private parts). Wash Face and genitals (private parts)  with your normal soap.  6. Wash thoroughly, paying special attention to the area where your surgery will be performed.  7. Thoroughly rinse your body with warm water from the neck down.  8. DO NOT shower/wash with your normal soap after using and rinsing off the CHG Soap.  9. Pat yourself dry with a CLEAN TOWEL.  10. Wear CLEAN  PAJAMAS to bed the night before surgery, wear comfortable clothes the morning of surgery  11. Place CLEAN SHEETS on your bed the night of your first shower and DO NOT SLEEP WITH PETS.  Day of Surgery:  Do not apply any deodorants/lotions.  Please wear clean clothes to the hospital/surgery center.   Remember to brush your teeth WITH YOUR REGULAR TOOTHPASTE.  Please read over the following fact sheets that you were given.

## 2018-01-15 ENCOUNTER — Other Ambulatory Visit: Payer: Self-pay

## 2018-01-15 ENCOUNTER — Other Ambulatory Visit (INDEPENDENT_AMBULATORY_CARE_PROVIDER_SITE_OTHER): Payer: Self-pay | Admitting: Physician Assistant

## 2018-01-15 ENCOUNTER — Encounter (HOSPITAL_COMMUNITY)
Admission: RE | Admit: 2018-01-15 | Discharge: 2018-01-15 | Disposition: A | Discharge status: Home / Self Care | Payer: Medicare Other | Source: Ambulatory Visit | Attending: Orthopaedic Surgery | Admitting: Orthopaedic Surgery

## 2018-01-15 ENCOUNTER — Encounter (HOSPITAL_COMMUNITY): Payer: Self-pay

## 2018-01-15 DIAGNOSIS — E785 Hyperlipidemia, unspecified: Secondary | ICD-10-CM | POA: Insufficient documentation

## 2018-01-15 DIAGNOSIS — M1712 Unilateral primary osteoarthritis, left knee: Secondary | ICD-10-CM | POA: Diagnosis not present

## 2018-01-15 DIAGNOSIS — I447 Left bundle-branch block, unspecified: Secondary | ICD-10-CM | POA: Insufficient documentation

## 2018-01-15 DIAGNOSIS — J45909 Unspecified asthma, uncomplicated: Secondary | ICD-10-CM | POA: Diagnosis not present

## 2018-01-15 DIAGNOSIS — E119 Type 2 diabetes mellitus without complications: Secondary | ICD-10-CM | POA: Diagnosis not present

## 2018-01-15 DIAGNOSIS — Z01812 Encounter for preprocedural laboratory examination: Secondary | ICD-10-CM | POA: Diagnosis present

## 2018-01-15 DIAGNOSIS — G4733 Obstructive sleep apnea (adult) (pediatric): Secondary | ICD-10-CM | POA: Diagnosis not present

## 2018-01-15 DIAGNOSIS — Z95810 Presence of automatic (implantable) cardiac defibrillator: Secondary | ICD-10-CM | POA: Diagnosis not present

## 2018-01-15 DIAGNOSIS — Z6841 Body Mass Index (BMI) 40.0 and over, adult: Secondary | ICD-10-CM | POA: Insufficient documentation

## 2018-01-15 DIAGNOSIS — I10 Essential (primary) hypertension: Secondary | ICD-10-CM | POA: Insufficient documentation

## 2018-01-15 DIAGNOSIS — I428 Other cardiomyopathies: Secondary | ICD-10-CM | POA: Diagnosis not present

## 2018-01-15 HISTORY — DX: Chronic kidney disease, unspecified: N18.9

## 2018-01-15 LAB — HEMOGLOBIN A1C
Hgb A1c MFr Bld: 7.4 % — ABNORMAL HIGH (ref 4.8–5.6)
Mean Plasma Glucose: 165.68 mg/dL

## 2018-01-15 LAB — CBC
HCT: 37.3 % (ref 36.0–46.0)
Hemoglobin: 11.6 g/dL — ABNORMAL LOW (ref 12.0–15.0)
MCH: 26.9 pg (ref 26.0–34.0)
MCHC: 31.1 g/dL (ref 30.0–36.0)
MCV: 86.3 fL (ref 78.0–100.0)
Platelets: 312 10*3/uL (ref 150–400)
RBC: 4.32 MIL/uL (ref 3.87–5.11)
RDW: 14.2 % (ref 11.5–15.5)
WBC: 10.3 10*3/uL (ref 4.0–10.5)

## 2018-01-15 LAB — SURGICAL PCR SCREEN
MRSA, PCR: NEGATIVE
Staphylococcus aureus: NEGATIVE

## 2018-01-15 LAB — BASIC METABOLIC PANEL
Anion gap: 12 (ref 5–15)
BUN: 31 mg/dL — ABNORMAL HIGH (ref 8–23)
CO2: 19 mmol/L — ABNORMAL LOW (ref 22–32)
Calcium: 9.1 mg/dL (ref 8.9–10.3)
Chloride: 105 mmol/L (ref 98–111)
Creatinine, Ser: 1.67 mg/dL — ABNORMAL HIGH (ref 0.44–1.00)
GFR calc Af Amer: 35 mL/min — ABNORMAL LOW (ref >60)
GFR calc non Af Amer: 30 mL/min — ABNORMAL LOW (ref >60)
Glucose, Bld: 171 mg/dL — ABNORMAL HIGH (ref 70–99)
Potassium: 4.6 mmol/L (ref 3.5–5.1)
Sodium: 136 mmol/L (ref 135–145)

## 2018-01-15 LAB — GLUCOSE, CAPILLARY: Glucose-Capillary: 172 mg/dL — ABNORMAL HIGH (ref 70–99)

## 2018-01-15 LAB — NO BLOOD PRODUCTS

## 2018-01-15 NOTE — Progress Notes (Addendum)
Called representative from [employer] who is paging representative to make him aware of the patient's upcoming surgery.  Advised he will return my call Spoke with [name] from [employer], he said he does not believe he will be needed day of surgery as they can place a magnet over the device but if he is needed by the physician's day of surgery he can be reached directly at [phone] and can arrive within an hour of the call.

## 2018-01-18 NOTE — Progress Notes (Addendum)
Yvonne Chart Review:   Case:  [id number] Date/Time:  01/26/18 1400   Procedure:  LEFT TOTAL KNEE ARTHROPLASTY (Left Knee)   Yvonne type:  Choice   Pre-op diagnosis:  left knee osteoarthritis   Location:  [place] OR ROOM 07 / [place] OR   Surgeon:  Mcarthur Rossetti, Yvonne      DISCUSSION: Patient is a 67 year old female scheduled Miller the above procedure.   History includes nonischemic cardiomyopathy '12, LBBB, BiV ICD (Device Bos Sci Dynagen X4 CRT-D G158 S/N [id number], 10/27/13, [facility]), HTN, DM2, hyperlipidemia, OSA (CPAP), asthma, never smoker. Jehovah's Witness. BMI is consistent with morbid obesity. No CKD reported, but labs trends this year are consistent with CKD stage III.   PCP records with latest labs still pending.   [name] from [employer] [phone]) was notified of surgery plans. He anticipates need Miller magnet. (Perioperative device form still pending from Dr. Dwana Curd, [facility]   Lamar 01/19/18 2:06 PM: Records and comparison labs received from Dr. Baird Cancer. Last visit with labs was on 10/29/17 and showed A1c 7.7%. BUN 24, Cr 1.36, eGFR 46. Labs on  06/23/17 showed BUN 26, Cr 1.38, eGFR 46. Notes do indicate that Dr. Baird Cancer follows her Miller CKD. EGFR this year have been consistent with CKD stage III. (I will add CKD to history. Her Cr has ranged from 1.23-1.75 since 05/2017, eGFR 34-51.) I also forwarded our lab results from 01/15/18 to Dr. Baird Cancer Miller review/follow-up purposes, but based on currently available information, I would anticipate that she can proceed as planned if no acute changes.     VS: BP (!) 122/53   Pulse 71   Temp 36.6 C   Resp 20   Ht '5\' 4"'$  (1.626 m)   Wt 118.8 kg   SpO2 99%   BMI 44.96 kg/m     PROVIDERS: - PCP is [name], Yvonne. - Cardiologist is Kela Millin, Yvonne.  Last office visit 10/19/17. Patient doing well by notes. She discussed her need Miller knee replacement. No new cardiac tests ordered. One year follow-up recommended.   - EP cardiologist  is Murtis Sink, Yvonne (Darke). Last office note with exam 05/28/17 by Eloisa Northern, NP. Last interrogation note is from 11/26/17 by Eloisa Northern, NP: "No issues today. You have an estimated 6.5 years left on your battery."   LABS: Preoperative labs noted. Awaiting records or input Miller PCP to determine if renal function is overall stable. No transfuse blood (signed that only albumin or albumin containing products may be administered if deemed necessary). (UPDATE: see above addendum regarding renal function.) (all labs ordered are listed, but only abnormal results are displayed)  Labs Reviewed  GLUCOSE, CAPILLARY - Abnormal; Notable Miller the following components:      Result Value   Glucose-Capillary 172 (*)    All other components within normal limits  BASIC METABOLIC PANEL - Abnormal; Notable Miller the following components:   CO2 19 (*)    Glucose, Bld 171 (*)    BUN 31 (*)    Creatinine, Ser 1.67 (*)    GFR calc non Af Amer 30 (*)    GFR calc Af Amer 35 (*)    All other components within normal limits  CBC - Abnormal; Notable Miller the following components:   Hemoglobin 11.6 (*)    All other components within normal limits  HEMOGLOBIN A1C - Abnormal; Notable Miller the following components:   Hgb A1c MFr Bld 7.4 (*)    All other  components within normal limits  SURGICAL PCR SCREEN  NO BLOOD PRODUCTS   Lab Results  Component Value Date   CREATININE 1.67 (H) 01/15/2018   CREATININE 1.23 (H) 07/10/2017   CREATININE 1.75 (H) 06/01/2017    IMAGES: CT C-spine 07/01/17: IMPRESSION: 1. Solid C4-C6 ACDF without significant residual stenosis. 2. 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 3. Small right paracentral disc protrusion at C6-7 without stenosis.   EKG: 10/19/17 [facility]): NSR, bi-ventricularly paced rhythm.     CV: Echo 07/06/14 [facility], scanned under Media tab, Correspondence  07/10/17): 1.  LV cavity minimally dilated.  Mild concentric LVH.  Normal global wall motion.  Doppler evidence of grade IAI diastolic dysfunction.  Normal systolic function.  Calculated EF 55%. 2.  LA cavity mild to moderately dilated. 3.  Trace mitral regurgitation. 4.  Mild tricuspid regurgitation.  No evidence of pulmonary hypertension. 5.  Compared to prior echo 09/12/13, there is remarkable improvement of LV systolic function.  EF has improved from 28% to 55%  RHC/LHC 03/04/11:  1.  Nonischemic dilated cardiomyopathy with EF 40-45% with global hypokinesis without significant mitral regurgitation. 2.  Marked LV hypertrophy 3.  Mild luminal irregularity of the coronary arteries without any significant stenosis. 4.  Moderate pulmonary hypertension with elevated cardiac output and cardiac index due to morbid obesity. 5.  Elevated LV end-diastolic pressure secondary to marked LVH with morbid obesity.    Past Medical History:  Diagnosis Date  . AICD (automatic cardioverter/defibrillator) present   . Arthritis   . Asthma   . Carpal tunnel syndrome, bilateral   . Diabetes mellitus   . GERD (gastroesophageal reflux disease)   . Hyperlipemia   . Hypertension   . Obesity    morbid  . Sleep apnea    wears CPAP set at 12  . Wears glasses     Past Surgical History:  Procedure Laterality Date  . ABDOMINAL HYSTERECTOMY    . BACK SURGERY    . BREAST SURGERY    . CARPAL TUNNEL RELEASE Left 07/10/2017   Procedure: CARPAL TUNNEL RELEASE;  Surgeon: Ashok Pall, Yvonne;  Location: [place];  Service: Neurosurgery;  Laterality: Left;  left  . CHOLECYSTECTOMY    . DILATION AND CURETTAGE OF UTERUS    . ICD IMPLANT    . JOINT REPLACEMENT    . TONSILLECTOMY      MEDICATIONS: . albuterol (PROVENTIL HFA;VENTOLIN HFA) 108 (90 Base) MCG/ACT inhaler  . amLODipine (NORVASC) 5 MG tablet  . aspirin 81 MG tablet  . B Complex Vitamins (B COMPLEX-B12) TABS  . carvedilol (COREG) 25 MG tablet  .  cholecalciferol (VITAMIN D) 1000 units tablet  . Cinnamon 500 MG capsule  . diclofenac sodium (VOLTAREN) 1 % GEL  . hydrALAZINE (APRESOLINE) 50 MG tablet  . insulin NPH-regular Human (NOVOLIN 70/30) (70-30) 100 UNIT/ML injection  . metFORMIN (GLUCOPHAGE-XR) 500 MG 24 hr tablet  . Omega-3 Fatty Acids (FISH OIL) 1000 MG CAPS  . pravastatin (PRAVACHOL) 40 MG tablet  . sitaGLIPtin (JANUVIA) 100 MG tablet  . spironolactone (ALDACTONE) 50 MG tablet  . valsartan-hydrochlorothiazide (DIOVAN-HCT) 160-25 MG tablet   No current facility-administered medications Miller this encounter.     George Hugh [facility] Short Stay Center/Anesthesiology Phone [phone] 01/18/2018 4:05 PM

## 2018-01-19 ENCOUNTER — Encounter (HOSPITAL_COMMUNITY): Payer: Self-pay

## 2018-01-20 ENCOUNTER — Telehealth (INDEPENDENT_AMBULATORY_CARE_PROVIDER_SITE_OTHER): Payer: Self-pay | Admitting: Orthopaedic Surgery

## 2018-01-20 NOTE — Telephone Encounter (Signed)
She should stop her aspirin 6 days before surgery.  She does not need to stop her diabetic meds.

## 2018-01-20 NOTE — Telephone Encounter (Signed)
Please advise 

## 2018-01-20 NOTE — Telephone Encounter (Signed)
Patient left a voicemail wanting to know when she should stop taking aspirin as well as her diabetic medication, januvia. Her surgery is 9/24. Please advise # [phone]

## 2018-01-20 NOTE — Telephone Encounter (Signed)
Also, patient would like to know when to stop metformin. # [phone]

## 2018-01-21 NOTE — Telephone Encounter (Signed)
Left VM for patient letting her know the below message

## 2018-01-25 MED ORDER — DEXTROSE 5 % IV SOLN
3.0000 g | INTRAVENOUS | Status: AC
  Administered 2018-01-26: 3 g via INTRAVENOUS
  Filled 2018-01-25: qty 3

## 2018-01-25 MED ORDER — TRANEXAMIC ACID 1000 MG/10ML IV SOLN
1000.0000 mg | INTRAVENOUS | Status: AC
  Administered 2018-01-26: 1000 mg via INTRAVENOUS
  Filled 2018-01-25: qty 1000

## 2018-01-26 ENCOUNTER — Encounter (HOSPITAL_COMMUNITY): Payer: Self-pay | Admitting: *Deleted

## 2018-01-26 ENCOUNTER — Inpatient Hospital Stay (HOSPITAL_COMMUNITY): Payer: Medicare Other

## 2018-01-26 ENCOUNTER — Ambulatory Visit (HOSPITAL_COMMUNITY): Payer: Medicare Other | Admitting: Anesthesiology

## 2018-01-26 ENCOUNTER — Ambulatory Visit (HOSPITAL_COMMUNITY): Payer: Medicare Other | Admitting: Vascular Surgery

## 2018-01-26 ENCOUNTER — Inpatient Hospital Stay (HOSPITAL_COMMUNITY)
Admission: AD | Admit: 2018-01-26 | Discharge: 2018-01-29 | DRG: 470 | Disposition: A | Discharge status: Home Health | Payer: Medicare Other | Attending: Orthopaedic Surgery | Admitting: Orthopaedic Surgery

## 2018-01-26 ENCOUNTER — Other Ambulatory Visit: Payer: Self-pay

## 2018-01-26 ENCOUNTER — Encounter (HOSPITAL_COMMUNITY)
Admission: AD | Disposition: A | Discharge status: Home Health | Payer: Self-pay | Source: Home / Self Care | Attending: Orthopaedic Surgery

## 2018-01-26 DIAGNOSIS — G4730 Sleep apnea, unspecified: Secondary | ICD-10-CM | POA: Diagnosis present

## 2018-01-26 DIAGNOSIS — Z6841 Body Mass Index (BMI) 40.0 and over, adult: Secondary | ICD-10-CM | POA: Diagnosis not present

## 2018-01-26 DIAGNOSIS — E119 Type 2 diabetes mellitus without complications: Secondary | ICD-10-CM | POA: Diagnosis not present

## 2018-01-26 DIAGNOSIS — Z9049 Acquired absence of other specified parts of digestive tract: Secondary | ICD-10-CM

## 2018-01-26 DIAGNOSIS — Z96652 Presence of left artificial knee joint: Secondary | ICD-10-CM

## 2018-01-26 DIAGNOSIS — E785 Hyperlipidemia, unspecified: Secondary | ICD-10-CM | POA: Diagnosis present

## 2018-01-26 DIAGNOSIS — Z8249 Family history of ischemic heart disease and other diseases of the circulatory system: Secondary | ICD-10-CM | POA: Diagnosis not present

## 2018-01-26 DIAGNOSIS — Z95810 Presence of automatic (implantable) cardiac defibrillator: Secondary | ICD-10-CM | POA: Diagnosis not present

## 2018-01-26 DIAGNOSIS — K219 Gastro-esophageal reflux disease without esophagitis: Secondary | ICD-10-CM | POA: Diagnosis present

## 2018-01-26 DIAGNOSIS — Z90710 Acquired absence of both cervix and uterus: Secondary | ICD-10-CM

## 2018-01-26 DIAGNOSIS — I10 Essential (primary) hypertension: Secondary | ICD-10-CM | POA: Diagnosis not present

## 2018-01-26 DIAGNOSIS — Z973 Presence of spectacles and contact lenses: Secondary | ICD-10-CM | POA: Diagnosis not present

## 2018-01-26 DIAGNOSIS — M1712 Unilateral primary osteoarthritis, left knee: Principal | ICD-10-CM | POA: Diagnosis present

## 2018-01-26 DIAGNOSIS — Z96651 Presence of right artificial knee joint: Secondary | ICD-10-CM | POA: Diagnosis present

## 2018-01-26 DIAGNOSIS — Z823 Family history of stroke: Secondary | ICD-10-CM | POA: Diagnosis not present

## 2018-01-26 DIAGNOSIS — Z888 Allergy status to other drugs, medicaments and biological substances status: Secondary | ICD-10-CM | POA: Diagnosis not present

## 2018-01-26 HISTORY — PX: TOTAL KNEE ARTHROPLASTY: SHX125

## 2018-01-26 LAB — GLUCOSE, CAPILLARY
Glucose-Capillary: 120 mg/dL — ABNORMAL HIGH (ref 70–99)
Glucose-Capillary: 116 mg/dL — ABNORMAL HIGH (ref 70–99)
Glucose-Capillary: 152 mg/dL — ABNORMAL HIGH (ref 70–99)
Glucose-Capillary: 273 mg/dL — ABNORMAL HIGH (ref 70–99)

## 2018-01-26 SURGERY — ARTHROPLASTY, KNEE, TOTAL
Anesthesia: General | Site: Knee | Laterality: Left

## 2018-01-26 MED ORDER — FENTANYL CITRATE (PF) 100 MCG/2ML IJ SOLN
INTRAMUSCULAR | Status: AC
  Administered 2018-01-26: 100 ug via INTRAVENOUS
  Filled 2018-01-26: qty 2

## 2018-01-26 MED ORDER — INSULIN ASPART PROT & ASPART (70-30 MIX) 100 UNIT/ML ~~LOC~~ SUSP
20.0000 [IU] | Freq: Every day | SUBCUTANEOUS | Status: DC
  Administered 2018-01-27 – 2018-01-29 (×3): 20 [IU] via SUBCUTANEOUS

## 2018-01-26 MED ORDER — MIDAZOLAM HCL 2 MG/2ML IJ SOLN
1.0000 mg | Freq: Once | INTRAMUSCULAR | Status: AC
  Administered 2018-01-26: 1 mg via INTRAVENOUS

## 2018-01-26 MED ORDER — DOCUSATE SODIUM 100 MG PO CAPS
100.0000 mg | ORAL_CAPSULE | Freq: Two times a day (BID) | ORAL | Status: DC
  Administered 2018-01-26 – 2018-01-29 (×6): 100 mg via ORAL
  Filled 2018-01-26 (×6): qty 1

## 2018-01-26 MED ORDER — ACETAMINOPHEN 325 MG PO TABS: 325.0000 mg | ORAL_TABLET | Freq: Four times a day (QID) | ORAL | Status: DC | PRN

## 2018-01-26 MED ORDER — GABAPENTIN 100 MG PO CAPS
100.0000 mg | ORAL_CAPSULE | Freq: Three times a day (TID) | ORAL | Status: DC
  Administered 2018-01-26 – 2018-01-29 (×8): 100 mg via ORAL
  Filled 2018-01-26 (×8): qty 1

## 2018-01-26 MED ORDER — DIPHENHYDRAMINE HCL 12.5 MG/5ML PO ELIX: 12.5000 mg | ORAL_SOLUTION | ORAL | Status: DC | PRN

## 2018-01-26 MED ORDER — B COMPLEX-C PO TABS
1.0000 | ORAL_TABLET | Freq: Every day | ORAL | Status: DC
  Administered 2018-01-27 – 2018-01-29 (×3): 1 via ORAL
  Filled 2018-01-26 (×3): qty 1

## 2018-01-26 MED ORDER — LACTATED RINGERS IV SOLN
INTRAVENOUS | Status: DC | PRN
  Administered 2018-01-26 (×2): via INTRAVENOUS

## 2018-01-26 MED ORDER — 0.9 % SODIUM CHLORIDE (POUR BTL) OPTIME
TOPICAL | Status: DC | PRN
  Administered 2018-01-26: 1000 mL

## 2018-01-26 MED ORDER — HYDROMORPHONE HCL 1 MG/ML IJ SOLN
1.0000 mg | INTRAMUSCULAR | Status: DC | PRN
  Administered 2018-01-26: 1 mg via INTRAVENOUS
  Administered 2018-01-26: 2 mg via INTRAVENOUS
  Filled 2018-01-26 (×2): qty 2

## 2018-01-26 MED ORDER — PHENOL 1.4 % MT LIQD: 1.0000 | OROMUCOSAL | Status: DC | PRN

## 2018-01-26 MED ORDER — METHOCARBAMOL 500 MG PO TABS
500.0000 mg | ORAL_TABLET | Freq: Four times a day (QID) | ORAL | Status: DC | PRN
  Administered 2018-01-26 – 2018-01-29 (×6): 500 mg via ORAL
  Filled 2018-01-26 (×6): qty 1

## 2018-01-26 MED ORDER — PRAVASTATIN SODIUM 40 MG PO TABS
40.0000 mg | ORAL_TABLET | Freq: Every evening | ORAL | Status: DC
  Administered 2018-01-26 – 2018-01-28 (×3): 40 mg via ORAL
  Filled 2018-01-26 (×3): qty 1

## 2018-01-26 MED ORDER — FENTANYL CITRATE (PF) 250 MCG/5ML IJ SOLN
INTRAMUSCULAR | Status: AC
  Filled 2018-01-26: qty 5

## 2018-01-26 MED ORDER — ONDANSETRON HCL 4 MG PO TABS: 4.0000 mg | ORAL_TABLET | Freq: Four times a day (QID) | ORAL | Status: DC | PRN

## 2018-01-26 MED ORDER — ALBUTEROL SULFATE (2.5 MG/3ML) 0.083% IN NEBU: 2.5000 mg | INHALATION_SOLUTION | RESPIRATORY_TRACT | Status: DC | PRN

## 2018-01-26 MED ORDER — SUGAMMADEX SODIUM 200 MG/2ML IV SOLN
INTRAVENOUS | Status: DC | PRN
  Administered 2018-01-26: 300 mg via INTRAVENOUS

## 2018-01-26 MED ORDER — FENTANYL CITRATE (PF) 100 MCG/2ML IJ SOLN
100.0000 ug | Freq: Once | INTRAMUSCULAR | Status: AC
  Administered 2018-01-26: 100 ug via INTRAVENOUS

## 2018-01-26 MED ORDER — HYDROMORPHONE HCL 1 MG/ML IJ SOLN
INTRAMUSCULAR | Status: AC
  Administered 2018-01-26: 0.25 mg via INTRAVENOUS
  Filled 2018-01-26: qty 1

## 2018-01-26 MED ORDER — DEXAMETHASONE SODIUM PHOSPHATE 4 MG/ML IJ SOLN
INTRAMUSCULAR | Status: DC | PRN
  Administered 2018-01-26: 10 mg via INTRAVENOUS

## 2018-01-26 MED ORDER — METOCLOPRAMIDE HCL 5 MG/ML IJ SOLN: 5.0000 mg | Freq: Three times a day (TID) | INTRAMUSCULAR | Status: DC | PRN

## 2018-01-26 MED ORDER — ONDANSETRON HCL 4 MG/2ML IJ SOLN: 4.0000 mg | Freq: Four times a day (QID) | INTRAMUSCULAR | Status: DC | PRN

## 2018-01-26 MED ORDER — ONDANSETRON HCL 4 MG/2ML IJ SOLN: 4.0000 mg | Freq: Once | INTRAMUSCULAR | Status: DC | PRN

## 2018-01-26 MED ORDER — MIDAZOLAM HCL 2 MG/2ML IJ SOLN
INTRAMUSCULAR | Status: AC
  Filled 2018-01-26: qty 2

## 2018-01-26 MED ORDER — VALSARTAN-HYDROCHLOROTHIAZIDE 160-25 MG PO TABS: 1.0000 | ORAL_TABLET | Freq: Every day | ORAL | Status: DC

## 2018-01-26 MED ORDER — ROPIVACAINE HCL 7.5 MG/ML IJ SOLN
INTRAMUSCULAR | Status: DC | PRN
  Administered 2018-01-26: 20 mL via PERINEURAL

## 2018-01-26 MED ORDER — PHENYLEPHRINE HCL 10 MG/ML IJ SOLN
INTRAMUSCULAR | Status: DC | PRN
  Administered 2018-01-26: 80 ug via INTRAVENOUS

## 2018-01-26 MED ORDER — POLYETHYLENE GLYCOL 3350 17 G PO PACK: 17.0000 g | PACK | Freq: Every day | ORAL | Status: DC | PRN

## 2018-01-26 MED ORDER — VITAMIN D 1000 UNITS PO TABS
1000.0000 [IU] | ORAL_TABLET | Freq: Every day | ORAL | Status: DC
  Administered 2018-01-27 – 2018-01-29 (×3): 1000 [IU] via ORAL
  Filled 2018-01-26 (×3): qty 1

## 2018-01-26 MED ORDER — RIVAROXABAN 10 MG PO TABS
10.0000 mg | ORAL_TABLET | Freq: Every day | ORAL | Status: DC
  Administered 2018-01-27 – 2018-01-29 (×3): 10 mg via ORAL
  Filled 2018-01-26 (×3): qty 1

## 2018-01-26 MED ORDER — SPIRONOLACTONE 25 MG PO TABS
50.0000 mg | ORAL_TABLET | Freq: Every day | ORAL | Status: DC
  Administered 2018-01-26 – 2018-01-29 (×4): 50 mg via ORAL
  Filled 2018-01-26 (×4): qty 2

## 2018-01-26 MED ORDER — INSULIN ASPART 100 UNIT/ML ~~LOC~~ SOLN
0.0000 [IU] | Freq: Every day | SUBCUTANEOUS | Status: DC
  Administered 2018-01-26: 3 [IU] via SUBCUTANEOUS

## 2018-01-26 MED ORDER — METOCLOPRAMIDE HCL 5 MG PO TABS: 5.0000 mg | ORAL_TABLET | Freq: Three times a day (TID) | ORAL | Status: DC | PRN

## 2018-01-26 MED ORDER — INSULIN ASPART PROT & ASPART (70-30 MIX) 100 UNIT/ML ~~LOC~~ SUSP
60.0000 [IU] | Freq: Every day | SUBCUTANEOUS | Status: DC
  Administered 2018-01-26: 60 [IU] via SUBCUTANEOUS
  Filled 2018-01-26: qty 10

## 2018-01-26 MED ORDER — PROPOFOL 10 MG/ML IV BOLUS
INTRAVENOUS | Status: DC | PRN
  Administered 2018-01-26: 200 mg via INTRAVENOUS

## 2018-01-26 MED ORDER — SODIUM CHLORIDE 0.9 % IV SOLN
INTRAVENOUS | Status: DC
  Administered 2018-01-26: 19:00:00 via INTRAVENOUS

## 2018-01-26 MED ORDER — ROCURONIUM BROMIDE 100 MG/10ML IV SOLN
INTRAVENOUS | Status: DC | PRN
  Administered 2018-01-26: 50 mg via INTRAVENOUS

## 2018-01-26 MED ORDER — AMLODIPINE BESYLATE 5 MG PO TABS
5.0000 mg | ORAL_TABLET | Freq: Every day | ORAL | Status: DC
  Administered 2018-01-27 – 2018-01-29 (×3): 5 mg via ORAL
  Filled 2018-01-26 (×4): qty 1

## 2018-01-26 MED ORDER — CEFAZOLIN SODIUM-DEXTROSE 2-4 GM/100ML-% IV SOLN
2.0000 g | Freq: Four times a day (QID) | INTRAVENOUS | Status: AC
  Administered 2018-01-26 – 2018-01-27 (×2): 2 g via INTRAVENOUS
  Filled 2018-01-26 (×2): qty 100

## 2018-01-26 MED ORDER — OXYCODONE HCL 5 MG PO TABS
10.0000 mg | ORAL_TABLET | ORAL | Status: DC | PRN
  Administered 2018-01-26 – 2018-01-27 (×2): 15 mg via ORAL
  Administered 2018-01-29 (×2): 10 mg via ORAL
  Filled 2018-01-26 (×2): qty 3

## 2018-01-26 MED ORDER — LACTATED RINGERS IV SOLN
Freq: Once | INTRAVENOUS | Status: AC
  Administered 2018-01-26: 12:00:00 via INTRAVENOUS

## 2018-01-26 MED ORDER — METFORMIN HCL ER 500 MG PO TB24
500.0000 mg | ORAL_TABLET | Freq: Every day | ORAL | Status: DC
  Administered 2018-01-27 – 2018-01-29 (×3): 500 mg via ORAL
  Filled 2018-01-26 (×3): qty 1

## 2018-01-26 MED ORDER — ALUM & MAG HYDROXIDE-SIMETH 200-200-20 MG/5ML PO SUSP: 30.0000 mL | ORAL | Status: DC | PRN

## 2018-01-26 MED ORDER — INSULIN ASPART 100 UNIT/ML ~~LOC~~ SOLN
0.0000 [IU] | Freq: Three times a day (TID) | SUBCUTANEOUS | Status: DC
  Administered 2018-01-27: 11 [IU] via SUBCUTANEOUS
  Administered 2018-01-27: 7 [IU] via SUBCUTANEOUS
  Administered 2018-01-27: 4 [IU] via SUBCUTANEOUS
  Administered 2018-01-28 – 2018-01-29 (×3): 3 [IU] via SUBCUTANEOUS

## 2018-01-26 MED ORDER — LINAGLIPTIN 5 MG PO TABS
5.0000 mg | ORAL_TABLET | Freq: Every day | ORAL | Status: DC
  Administered 2018-01-27 – 2018-01-29 (×3): 5 mg via ORAL
  Filled 2018-01-26 (×3): qty 1

## 2018-01-26 MED ORDER — CHLORHEXIDINE GLUCONATE 4 % EX LIQD: 60.0000 mL | Freq: Once | CUTANEOUS | Status: DC

## 2018-01-26 MED ORDER — MIDAZOLAM HCL 2 MG/2ML IJ SOLN
INTRAMUSCULAR | Status: AC
  Administered 2018-01-26: 1 mg via INTRAVENOUS
  Filled 2018-01-26: qty 2

## 2018-01-26 MED ORDER — HYDROCHLOROTHIAZIDE 25 MG PO TABS
25.0000 mg | ORAL_TABLET | Freq: Every day | ORAL | Status: DC
  Administered 2018-01-26 – 2018-01-29 (×4): 25 mg via ORAL
  Filled 2018-01-26 (×4): qty 1

## 2018-01-26 MED ORDER — HYDROMORPHONE HCL 1 MG/ML IJ SOLN
0.2500 mg | INTRAMUSCULAR | Status: DC | PRN
  Administered 2018-01-26: 0.25 mg via INTRAVENOUS
  Administered 2018-01-26: 0.5 mg via INTRAVENOUS
  Administered 2018-01-26: 0.25 mg via INTRAVENOUS

## 2018-01-26 MED ORDER — CLONIDINE HCL (ANALGESIA) 100 MCG/ML EP SOLN
EPIDURAL | Status: DC | PRN
  Administered 2018-01-26: 50 ug

## 2018-01-26 MED ORDER — LIDOCAINE HCL (CARDIAC) PF 100 MG/5ML IV SOSY
PREFILLED_SYRINGE | INTRAVENOUS | Status: DC | PRN
  Administered 2018-01-26: 20 mg via INTRAVENOUS

## 2018-01-26 MED ORDER — OXYCODONE HCL 5 MG PO TABS
5.0000 mg | ORAL_TABLET | ORAL | Status: DC | PRN
  Administered 2018-01-27 – 2018-01-29 (×11): 10 mg via ORAL
  Filled 2018-01-26 (×13): qty 2

## 2018-01-26 MED ORDER — HYDRALAZINE HCL 50 MG PO TABS
50.0000 mg | ORAL_TABLET | Freq: Two times a day (BID) | ORAL | Status: DC
  Administered 2018-01-26 – 2018-01-29 (×5): 50 mg via ORAL
  Filled 2018-01-26 (×6): qty 1

## 2018-01-26 MED ORDER — METHOCARBAMOL 1000 MG/10ML IJ SOLN
500.0000 mg | Freq: Four times a day (QID) | INTRAVENOUS | Status: DC | PRN
  Filled 2018-01-26: qty 5

## 2018-01-26 MED ORDER — MENTHOL 3 MG MT LOZG: 1.0000 | LOZENGE | OROMUCOSAL | Status: DC | PRN

## 2018-01-26 MED ORDER — FENTANYL CITRATE (PF) 100 MCG/2ML IJ SOLN
INTRAMUSCULAR | Status: DC | PRN
  Administered 2018-01-26: 100 ug via INTRAVENOUS
  Administered 2018-01-26: 50 ug via INTRAVENOUS
  Administered 2018-01-26: 100 ug via INTRAVENOUS
  Administered 2018-01-26: 50 ug via INTRAVENOUS
  Administered 2018-01-26: 100 ug via INTRAVENOUS

## 2018-01-26 MED ORDER — SODIUM CHLORIDE 0.9 % IR SOLN
Status: DC | PRN
  Administered 2018-01-26: 3000 mL

## 2018-01-26 MED ORDER — PROPOFOL 10 MG/ML IV BOLUS
INTRAVENOUS | Status: AC
  Filled 2018-01-26: qty 20

## 2018-01-26 MED ORDER — ONDANSETRON HCL 4 MG/2ML IJ SOLN
INTRAMUSCULAR | Status: DC | PRN
  Administered 2018-01-26: 4 mg via INTRAVENOUS

## 2018-01-26 MED ORDER — CARVEDILOL 25 MG PO TABS
37.5000 mg | ORAL_TABLET | Freq: Two times a day (BID) | ORAL | Status: DC
  Administered 2018-01-26 – 2018-01-29 (×6): 37.5 mg via ORAL
  Filled 2018-01-26 (×6): qty 1

## 2018-01-26 MED ORDER — IRBESARTAN 150 MG PO TABS
150.0000 mg | ORAL_TABLET | Freq: Every day | ORAL | Status: DC
  Administered 2018-01-26 – 2018-01-29 (×4): 150 mg via ORAL
  Filled 2018-01-26 (×4): qty 1

## 2018-01-26 MED ORDER — PANTOPRAZOLE SODIUM 40 MG PO TBEC
40.0000 mg | DELAYED_RELEASE_TABLET | Freq: Every day | ORAL | Status: DC
  Administered 2018-01-26 – 2018-01-29 (×4): 40 mg via ORAL
  Filled 2018-01-26 (×4): qty 1

## 2018-01-26 SURGICAL SUPPLY — 63 items
BANDAGE ACE 6X5 VEL STRL LF (GAUZE/BANDAGES/DRESSINGS) ×2 IMPLANT
BANDAGE ESMARK 6X9 LF (GAUZE/BANDAGES/DRESSINGS) ×1 IMPLANT
BASEPLATE TIBIAL NO 3 (Orthopedic Implant) ×1 IMPLANT
BLADE SAG 18X100X1.27 (BLADE) ×2 IMPLANT
BNDG CMPR 9X6 STRL LF SNTH (GAUZE/BANDAGES/DRESSINGS) ×1
BNDG ESMARK 6X9 LF (GAUZE/BANDAGES/DRESSINGS) ×2
BOWL SMART MIX CTS (DISPOSABLE) ×2 IMPLANT
CEMENT BONE SIMPLEX SPEEDSET (Cement) ×2 IMPLANT
COVER SURGICAL LIGHT HANDLE (MISCELLANEOUS) ×2 IMPLANT
CUFF TOURNIQUET SINGLE 34IN LL (TOURNIQUET CUFF) ×2 IMPLANT
DRAPE EXTREMITY T 121X128X90 (DRAPE) ×2 IMPLANT
DRAPE HALF SHEET 40X57 (DRAPES) ×2 IMPLANT
DRAPE U-SHAPE 47X51 STRL (DRAPES) ×2 IMPLANT
DRSG PAD ABDOMINAL 8X10 ST (GAUZE/BANDAGES/DRESSINGS) ×2 IMPLANT
DURAPREP 26ML APPLICATOR (WOUND CARE) ×4 IMPLANT
ELECT CAUTERY BLADE 6.4 (BLADE) ×2 IMPLANT
ELECT REM PT RETURN 9FT ADLT (ELECTROSURGICAL) ×2
ELECTRODE REM PT RTRN 9FT ADLT (ELECTROSURGICAL) ×1 IMPLANT
FACESHIELD WRAPAROUND (MASK) ×4 IMPLANT
FEMORAL PEG DISTAL FIXATION (Orthopedic Implant) ×1 IMPLANT
FEMORAL TRIATH POST STAB  SZ3 (Orthopedic Implant) ×1 IMPLANT
FEMORAL TRIATH POST STAB SZ3 (Orthopedic Implant) ×1 IMPLANT
GAUZE SPONGE 4X4 12PLY STRL (GAUZE/BANDAGES/DRESSINGS) ×2 IMPLANT
GAUZE XEROFORM 1X8 LF (GAUZE/BANDAGES/DRESSINGS) ×2 IMPLANT
GLOVE BIOGEL PI IND STRL 8 (GLOVE) ×2 IMPLANT
GLOVE BIOGEL PI INDICATOR 8 (GLOVE) ×2
GLOVE ECLIPSE 7.0 STRL STRAW (GLOVE) ×2 IMPLANT
GLOVE ORTHO TXT STRL SZ7.5 (GLOVE) ×2 IMPLANT
GLOVE SURG ORTHO 8.0 STRL STRW (GLOVE) ×2 IMPLANT
GOWN STRL REUS W/ TWL LRG LVL3 (GOWN DISPOSABLE) ×1 IMPLANT
GOWN STRL REUS W/ TWL XL LVL3 (GOWN DISPOSABLE) ×2 IMPLANT
GOWN STRL REUS W/TWL LRG LVL3 (GOWN DISPOSABLE) ×2
GOWN STRL REUS W/TWL XL LVL3 (GOWN DISPOSABLE) ×6
HANDPIECE INTERPULSE COAX TIP (DISPOSABLE) ×2
IMMOBILIZER KNEE 22 UNIV (SOFTGOODS) ×2 IMPLANT
INSERT TIB 3 13XPOST STAB BRNG (Insert) IMPLANT
INSERT TIB TRIATH PS X3 (Insert) ×2 IMPLANT
INSRT TIB 3 13XPOST STAB BRNG (Insert) ×1 IMPLANT
KIT BASIN OR (CUSTOM PROCEDURE TRAY) ×2 IMPLANT
KIT TURNOVER KIT B (KITS) ×2 IMPLANT
MANIFOLD NEPTUNE II (INSTRUMENTS) ×2 IMPLANT
NDL SAFETY ECLIPSE 18X1.5 (NEEDLE) IMPLANT
NEEDLE HYPO 18GX1.5 SHARP (NEEDLE)
NS IRRIG 1000ML POUR BTL (IV SOLUTION) ×2 IMPLANT
PACK TOTAL JOINT (CUSTOM PROCEDURE TRAY) ×2 IMPLANT
PAD ARMBOARD 7.5X6 YLW CONV (MISCELLANEOUS) ×2 IMPLANT
PADDING CAST COTTON 6X4 STRL (CAST SUPPLIES) ×2 IMPLANT
PATELLA TRIATHLON SZ 29 9 MM (Orthopedic Implant) ×2 IMPLANT
SET HNDPC FAN SPRY TIP SCT (DISPOSABLE) ×1 IMPLANT
SET PAD KNEE POSITIONER (MISCELLANEOUS) ×2 IMPLANT
STAPLER VISISTAT 35W (STAPLE) IMPLANT
STRIP CLOSURE SKIN 1/2X4 (GAUZE/BANDAGES/DRESSINGS) IMPLANT
SUCTION FRAZIER HANDLE 10FR (MISCELLANEOUS) ×1
SUCTION TUBE FRAZIER 10FR DISP (MISCELLANEOUS) ×1 IMPLANT
SUT MNCRL AB 4-0 PS2 18 (SUTURE) IMPLANT
SUT VIC AB 0 CT1 27 (SUTURE) ×2
SUT VIC AB 0 CT1 27XBRD ANBCTR (SUTURE) ×1 IMPLANT
SUT VIC AB 1 CT1 27 (SUTURE) ×4
SUT VIC AB 1 CT1 27XBRD ANBCTR (SUTURE) ×2 IMPLANT
SUT VIC AB 2-0 CT1 27 (SUTURE) ×4
SUT VIC AB 2-0 CT1 TAPERPNT 27 (SUTURE) ×2 IMPLANT
TOWEL OR 17X26 10 PK STRL BLUE (TOWEL DISPOSABLE) ×2 IMPLANT
WRAP KNEE MAXI GEL POST OP (GAUZE/BANDAGES/DRESSINGS) ×2 IMPLANT

## 2018-01-26 NOTE — Anesthesia Preprocedure Evaluation (Addendum)
Anesthesia Evaluation  Patient identified by MRN, date of birth, ID band Patient awake    Reviewed: Allergy & Precautions, NPO status , Patient's Chart, lab work & pertinent test results  Airway Mallampati: II  TM Distance: >3 FB Neck ROM: Full    Dental  (+) Dental Advisory Given   Pulmonary asthma , sleep apnea ,    breath sounds clear to auscultation       Cardiovascular hypertension, Pt. on medications and Pt. on home beta blockers + pacemaker + Cardiac Defibrillator  Rhythm:Regular Rate:Normal  Echo 07/06/14 [facility] Cardi[facility], scanned under Media tab, Correspondence 07/10/17): 1. LV cavity minimally dilated. Mild concentric LVH. Normal global wall motion. Doppler evidence of grade IAI diastolic dysfunction. Normal systolic function. Calculated EF 55%. 2. LA cavity mild to moderately dilated. 3. Trace mitral regurgitation. 4. Mild tricuspid regurgitation. No evidence of pulmonary hypertension.   Neuro/Psych negative neurological ROS     GI/Hepatic Neg liver ROS, GERD  ,  Endo/Other  diabetes, Type 2, Insulin DependentMorbid obesity  Renal/GU CRFRenal disease     Musculoskeletal  (+) Arthritis ,   Abdominal   Peds  Hematology  (+) anemia , JEHOVAH'S WITNESS  Anesthesia Other Findings   Reproductive/Obstetrics                            Lab Results  Component Value Date   WBC 10.3 01/15/2018   HGB 11.6 (L) 01/15/2018   HCT 37.3 01/15/2018   MCV 86.3 01/15/2018   PLT 312 01/15/2018   Lab Results  Component Value Date   CREATININE 1.67 (H) 01/15/2018   BUN 31 (H) 01/15/2018   NA 136 01/15/2018   K 4.6 01/15/2018   CL 105 01/15/2018   CO2 19 (L) 01/15/2018   Lab Results  Component Value Date   INR 0.93 06/13/2012   INR 0.98 04/21/2009   INR 0.93 04/21/2009    Anesthesia Physical Anesthesia Plan  ASA: III  Anesthesia Plan: General   Post-op Pain  Management:  Regional for Post-op pain   Induction: Intravenous  PONV Risk Score and Plan: 3 and Dexamethasone, Ondansetron, Midazolam and Treatment may vary due to age or medical condition  Airway Management Planned: Oral ETT  Additional Equipment:   Intra-op Plan:   Post-operative Plan: Extubation in OR  Informed Consent: I have reviewed the patients History and Physical, chart, labs and discussed the procedure including the risks, benefits and alternatives for the proposed anesthesia with the patient or authorized representative who has indicated his/her understanding and acceptance.   Dental advisory given  Plan Discussed with: CRNA  Anesthesia Plan Comments:        Anesthesia Quick Evaluation

## 2018-01-26 NOTE — Op Note (Signed)
NAME: ADALEIGH, WARF MEDICAL RECORD QQ:[id number] ACCOUNT [id number] DATE OF BIRTH:[birth year] FACILITY: [facility] LOCATION: [facility] PHYSICIAN:Addiel Mccardle Kerry [place], MD  OPERATIVE REPORT  DATE OF PROCEDURE:  01/26/2018  PREOPERATIVE DIAGNOSIS:  Primary osteoarthritis and degenerative joint disease, left knee.  POSTOPERATIVE DIAGNOSIS:  Primary osteoarthritis and degenerative joint disease, left knee.  PROCEDURE:  Left total knee arthroplasty.  IMPLANTS:  Stryker Triathlon cemented knee system with size 3 femur, size 3 tibial tray, 13 millimeter fixed bearing polyethylene insert, size 29 patellar button.  SURGEON:  Lind Guest. Francine Graven, MD  ASSISTANT:  Erskine Emery, PA-C.  ANESTHESIA:   1.  General. 2.  Left lower extremity adductor canal block.  ANTIBIOTICS:  Two grams IV Ancef.  ESTIMATED BLOOD LOSS:  200 mL.  TOURNIQUET TIME:  Less than an hour and a half.  COMPLICATIONS:  None.  INDICATIONS:  The patient is a 67 year old morbidly obese female well known to me.  I actually placed a right total knee arthroplasty in her 13 years ago.  Her left knee has severe end-stage arthritis.  At this point, she wished to proceed with a left  total knee arthroplasty given the failure of multiple rounds of conservative treatment measures.  She understands fully the risk of acute blood loss anemia, nerve or vessel injury, fracture, infection and DVT.  She understands the risk of implant failure  as well.  She understands her goals are to decrease pain, improve mobility and overall improve quality of life.  She also understands the risks are all increased given her obesity.  She is a Sales promotion account executive Witness and we absolutely understand the need to not  transfuse her.  DESCRIPTION OF PROCEDURE:  After informed consent was obtained and appropriate left knee was marked.  She was brought to the operating room after an adductor canal block was obtained in the holding room.  She was placed  supine on the operating table.   General anesthesia was then obtained.  A nonsterile tourniquet was placed around the upper right thigh and her right thigh, knee, leg, ankle, foot were prepped and draped with DuraPrep and sterile drapes.  A time-out was called.  She was identified,  correct patient, correct left knee.  We then used the Esmarch to wrap that leg and tourniquet was inflated to 300 mm of pressure.  We then made a direct midline incision over the patella and carried this proximally and distally.  We dissected down the  knee joint and carried out a medial parapatellar arthrotomy finding a very large joint effusion and significant synovitis throughout the knee as well as severe osteoarthritis.  With the knee in a flexed position, we removed remnants of ACL, PCL, medial  and lateral meniscus.  We set our extramedullary cutting guide for taking 9 mm off the high side, correcting varus and valgus and neutral slope.  We made this cut without difficulty.  We then went to the femur and used the intramedullary drill and a  device through the notch to set our distal femoral cutting guide for a left knee at 5 degrees externally rotated and an 8 mm distal femoral cut.  We made this cut without difficulty and brought the knee back down to full extension and with a 9 mm  extension block she hyperextended.  Of note, preoperatively she also hyperextended.  We then went back to the femur and put our femoral sizing guide based off the epicondylar axis and chose a size 3 femur.  We placed a 4-in-1 cutting block  for a size 3  femur, made our anterior and posterior cuts followed by our chamfer cuts.  We then made our femoral box cut.  Attention was then turned to the back of the tibia.  We placed our tibial trial tray with a size 3 over the tibia for coverage.  I liked the  position of the tray and we set the rotation of the tibial tubercle and the femur.  We then made our keel cut off of this.  With the size 3 tibia  tray and the size 3 femur, we tried a 13 millimeter fixed bearing polyethylene insert and we felt like that  gave [place] stability.  We then made our patellar cut and drilled 3 holes for size 29 patellar button.  We then removed all instrumentation from the knee and irrigated the knee with normal saline solution using pulsatile lavage.  We mixed our cement and  cemented the real Stryker Triathlon tibial tray size 3 followed by the real size 3 femur.  We cleaned cement debris from the knee and then placed our fixed bearing size 13 mm polyethylene insert for that size 3 tibial tray and cemented our patellar  button.  Once the cement had hardened, we removed cement debris further from the knee and let the tourniquet down and hemostasis obtained with electrocautery.  We then closed the arthrotomy with interrupted #1 Vicryl suture followed by 0 Vicryl in the  deep tissue, 2-0 Vicryl subcutaneous tissue and interrupted staples on the skin.  Xeroform well-padded sterile dressing was applied.  She was awakened, extubated, and taken to recovery room in stable condition.  All final counts were correct.  There were  no complications noted.  Of note, Benita Stabile, PA-C, assisted the entire case and assistance was crucial for facilitating all aspects of this case from the beginning to the end.  TN/NUANCE  D:01/26/2018 T:01/26/2018 JOB:[id number]

## 2018-01-26 NOTE — Anesthesia Procedure Notes (Signed)
Procedure Name: Intubation Date/Time: 01/26/2018 2:06 PM Performed by: Charmayne Odell T, CRNA Pre-anesthesia Checklist: Patient identified, Emergency Drugs available, Suction available and Patient being monitored Patient Re-evaluated:Patient Re-evaluated prior to induction Oxygen Delivery Method: Circle system utilized Preoxygenation: Pre-oxygenation with 100% oxygen Induction Type: IV induction Ventilation: Mask ventilation without difficulty Laryngoscope Size: Mac and 4 Grade View: Grade II Tube type: Oral Tube size: 7.5 mm Number of attempts: 1 Airway Equipment and Method: Patient positioned with wedge pillow and Stylet Placement Confirmation: ETT inserted through vocal cords under direct vision,  positive ETCO2 and breath sounds checked- equal and bilateral Secured at: 21 cm Tube secured with: Tape Dental Injury: Teeth and Oropharynx as per pre-operative assessment

## 2018-01-26 NOTE — Brief Op Note (Signed)
01/26/2018  3:56 PM  PATIENT:  Yvonne Miller  67 y.o. female  PRE-OPERATIVE DIAGNOSIS:  left knee osteoarthritis  POST-OPERATIVE DIAGNOSIS:  left knee osteoarthritis  PROCEDURE:  Procedure(s): LEFT TOTAL KNEE ARTHROPLASTY (Left)  SURGEON:  Surgeon(s) and Role:    Mcarthur Rossetti, MD - Primary  PHYSICIAN ASSISTANT: Benita Stabile, PA-C  ANESTHESIA:   regional and general  EBL:  200 cc  COUNTS:  YES  TOURNIQUET:   Total Tourniquet Time Documented: Thigh (Left) - 69 minutes Total: Thigh (Left) - 69 minutes   DICTATION: .Other Dictation: Dictation Number [id number]  PLAN OF CARE: Admit to inpatient   PATIENT DISPOSITION:  PACU - hemodynamically stable.   Delay start of Pharmacological VTE agent (>24hrs) due to surgical blood loss or risk of bleeding: no

## 2018-01-26 NOTE — Anesthesia Procedure Notes (Signed)
Anesthesia Regional Block: Adductor canal block   Pre-Anesthetic Checklist: ,, timeout performed, Correct Patient, Correct Site, Correct Laterality, Correct Procedure, Correct Position, site marked, Risks and benefits discussed,  Surgical consent,  Pre-op evaluation,  At surgeon's request and post-op pain management  Laterality: Left  Prep: chloraprep       Needles:  Injection technique: Single-shot  Needle Type: Echogenic Needle     Needle Length: 9cm  Needle Gauge: 21     Additional Needles:   Procedures:,,,, ultrasound used (permanent image in chart),,,,  Narrative:  Start time: 01/26/2018 1:17 PM End time: 01/26/2018 1:24 PM Injection made incrementally with aspirations every 5 mL.  Performed by: Personally  Anesthesiologist: Suzette Battiest, MD

## 2018-01-26 NOTE — Transfer of Care (Signed)
Immediate Anesthesia Transfer of Care Note  Patient: Yvonne Miller  Procedure(s) Performed: LEFT TOTAL KNEE ARTHROPLASTY (Left Knee)  Patient Location: PACU  Anesthesia Type:GA combined with regional for post-op pain  Level of Consciousness: awake, alert  and oriented  Airway & Oxygen Therapy: Patient Spontanous Breathing and Patient connected to nasal cannula oxygen  Post-op Assessment: Report given to RN, Post -op Vital signs reviewed and stable and Patient moving all extremities  Post vital signs: Reviewed and stable  Last Vitals:  Vitals Value Taken Time  BP 142/67 01/26/2018  4:20 PM  Temp 36.5 C 01/26/2018  4:20 PM  Pulse 78 01/26/2018  4:26 PM  Resp 25 01/26/2018  4:26 PM  SpO2 93 % 01/26/2018  4:26 PM  Vitals shown include unvalidated device data.  Last Pain:  Vitals:   01/26/18 1152  TempSrc:   PainSc: 8       Patients Stated Pain Goal: 3 (53/20/23 3435)  Complications: No apparent anesthesia complications

## 2018-01-26 NOTE — H&P (Signed)
TOTAL KNEE ADMISSION H&P  Patient is being admitted for left total knee arthroplasty.  Subjective:  Chief Complaint:left knee pain.  HPI: Yvonne Miller, 66 y.o. female, has a history of pain and functional disability in the left knee due to arthritis and has failed non-surgical conservative treatments for greater than 12 weeks to includeNSAID's and/or analgesics, corticosteriod injections, viscosupplementation injections, flexibility and strengthening excercises, supervised PT with diminished ADL's post treatment, use of assistive devices, weight reduction as appropriate and activity modification.  Onset of symptoms was gradual, starting 5 years ago with gradually worsening course since that time. The patient noted no past surgery on the left knee(s).  Patient currently rates pain in the left knee(s) at 10 out of 10 with activity. Patient has night pain, worsening of pain with activity and weight bearing, pain that interferes with activities of daily living, pain with passive range of motion, crepitus and joint swelling.  Patient has evidence of subchondral sclerosis, periarticular osteophytes and joint space narrowing by imaging studies. There is no active infection.  Patient Active Problem List   Diagnosis Date Noted  . Unilateral primary osteoarthritis, left knee 08/18/2017   Past Medical History:  Diagnosis Date  . AICD (automatic cardioverter/defibrillator) present   . Arthritis   . Asthma   . Carpal tunnel syndrome, bilateral   . CKD (chronic kidney disease)   . Diabetes mellitus   . GERD (gastroesophageal reflux disease)   . Hyperlipemia   . Hypertension   . Obesity    morbid  . Sleep apnea    wears CPAP set at 12  . Wears glasses     Past Surgical History:  Procedure Laterality Date  . ABDOMINAL HYSTERECTOMY    . BACK SURGERY    . BREAST SURGERY    . CARPAL TUNNEL RELEASE Left 07/10/2017   Procedure: CARPAL TUNNEL RELEASE;  Surgeon: Ashok Pall, MD;  Location: [facility];   Service: Neurosurgery;  Laterality: Left;  left  . CHOLECYSTECTOMY    . DILATION AND CURETTAGE OF UTERUS    . ICD IMPLANT    . JOINT REPLACEMENT    . TONSILLECTOMY      Current Facility-Administered Medications  Medication Dose Route Frequency Provider Last Rate Last Dose  . ceFAZolin (ANCEF) 3 g in dextrose 5 % 50 mL IVPB  3 g Intravenous To SS-Surg Mcarthur Rossetti, MD      . chlorhexidine (HIBICLENS) 4 % liquid 4 application  60 mL Topical Once Erskine Emery W, PA-C      . tranexamic acid (CYKLOKAPRON) 1,000 mg in sodium chloride 0.9 % 100 mL IVPB  1,000 mg Intravenous To OR Mcarthur Rossetti, MD       Allergies  Allergen Reactions  . Other     NO BLOOD     Social History   Tobacco Use  . Smoking status: Never Smoker  . Smokeless tobacco: Never Used  Substance Use Topics  . Alcohol use: No    Family History  Problem Relation Age of Onset  . Hypertension Mother   . Stroke Mother   . Heart disease Father   . Heart attack Sister   . Heart attack Brother      Review of Systems  Musculoskeletal: Positive for joint pain.  All other systems reviewed and are negative.   Objective:  Physical Exam  Constitutional: She is oriented to person, place, and time. She appears well-developed and well-nourished.  HENT:  Head: Normocephalic and atraumatic.  Eyes: Pupils are  equal, round, and reactive to light. EOM are normal.  Neck: Normal range of motion. Neck supple.  Cardiovascular: Normal rate and regular rhythm.  Respiratory: Effort normal and breath sounds normal.  GI: Soft. Bowel sounds are normal.  Musculoskeletal:       Left knee: She exhibits decreased range of motion, swelling and abnormal alignment. Tenderness found. Medial joint line and lateral joint line tenderness noted.  Neurological: She is alert and oriented to person, place, and time.  Skin: Skin is warm and dry.  Psychiatric: She has a normal mood and affect.    Vital signs in last 24  hours: Temp:  [97.4 F (36.3 C)] 97.4 F (36.3 C) (09/24 1115) Pulse Rate:  [69] 69 (09/24 1115) Resp:  [20] 20 (09/24 1115) BP: (147)/(71) 147/71 (09/24 1115) SpO2:  [99 %] 99 % (09/24 1115) Weight:  [121.6 kg] 121.6 kg (09/24 1115)  Labs:   Estimated body mass index is 46 kg/m as calculated from the following:   Height as of this encounter: 5\' 4"  (1.626 m).   Weight as of this encounter: 121.6 kg.   Imaging Review Plain radiographs demonstrate severe degenerative joint disease of the left knee(s). The overall alignment ismild varus. The bone quality appears to be good for age and reported activity level.   Preoperative templating of the joint replacement has been completed, documented, and submitted to the Operating Room personnel in order to optimize intra-operative equipment management.   Anticipated LOS equal to or greater than 2 midnights due to - Age 25 and older with one or more of the following:  - Obesity  - Expected need for hospital services (PT, OT, Nursing) required for safe  discharge  - Anticipated need for postoperative skilled nursing care or inpatient rehab  - Active co-morbidities: None OR   - Unanticipated findings during/Post Surgery: None  - Patient is a high risk of re-admission due to: None     Assessment/Plan:  End stage arthritis, left knee   The patient history, physical examination, clinical judgment of the provider and imaging studies are consistent with end stage degenerative joint disease of the left knee(s) and total knee arthroplasty is deemed medically necessary. The treatment options including medical management, injection therapy arthroscopy and arthroplasty were discussed at length. The risks and benefits of total knee arthroplasty were presented and reviewed. The risks due to aseptic loosening, infection, stiffness, patella tracking problems, thromboembolic complications and other imponderables were discussed. The patient acknowledged  the explanation, agreed to proceed with the plan and consent was signed. Patient is being admitted for inpatient treatment for surgery, pain control, PT, OT, prophylactic antibiotics, VTE prophylaxis, progressive ambulation and ADL's and discharge planning. The patient is planning to be discharged home with home health services vs skilled nursing.

## 2018-01-27 ENCOUNTER — Encounter (HOSPITAL_COMMUNITY): Payer: Self-pay | Admitting: Orthopaedic Surgery

## 2018-01-27 LAB — CBC
HCT: 32.1 % — ABNORMAL LOW (ref 36.0–46.0)
Hemoglobin: 10 g/dL — ABNORMAL LOW (ref 12.0–15.0)
MCH: 26.9 pg (ref 26.0–34.0)
MCHC: 31.2 g/dL (ref 30.0–36.0)
MCV: 86.3 fL (ref 78.0–100.0)
Platelets: 278 10*3/uL (ref 150–400)
RBC: 3.72 MIL/uL — ABNORMAL LOW (ref 3.87–5.11)
RDW: 13.8 % (ref 11.5–15.5)
WBC: 14.4 10*3/uL — ABNORMAL HIGH (ref 4.0–10.5)

## 2018-01-27 LAB — BASIC METABOLIC PANEL
Anion gap: 11 (ref 5–15)
BUN: 26 mg/dL — ABNORMAL HIGH (ref 8–23)
CO2: 22 mmol/L (ref 22–32)
Calcium: 8.6 mg/dL — ABNORMAL LOW (ref 8.9–10.3)
Chloride: 100 mmol/L (ref 98–111)
Creatinine, Ser: 1.55 mg/dL — ABNORMAL HIGH (ref 0.44–1.00)
GFR calc Af Amer: 39 mL/min — ABNORMAL LOW (ref >60)
GFR calc non Af Amer: 33 mL/min — ABNORMAL LOW (ref >60)
Glucose, Bld: 286 mg/dL — ABNORMAL HIGH (ref 70–99)
Potassium: 5 mmol/L (ref 3.5–5.1)
Sodium: 133 mmol/L — ABNORMAL LOW (ref 135–145)

## 2018-01-27 LAB — GLUCOSE, CAPILLARY
Glucose-Capillary: 259 mg/dL — ABNORMAL HIGH (ref 70–99)
Glucose-Capillary: 202 mg/dL — ABNORMAL HIGH (ref 70–99)
Glucose-Capillary: 149 mg/dL — ABNORMAL HIGH (ref 70–99)
Glucose-Capillary: 125 mg/dL — ABNORMAL HIGH (ref 70–99)

## 2018-01-27 MED ORDER — INSULIN ASPART PROT & ASPART (70-30 MIX) 100 UNIT/ML ~~LOC~~ SUSP
60.0000 [IU] | Freq: Every day | SUBCUTANEOUS | Status: DC
  Administered 2018-01-27 – 2018-01-28 (×2): 60 [IU] via SUBCUTANEOUS

## 2018-01-27 NOTE — Progress Notes (Signed)
Subjective: 1 Day Post-Op Procedure(s) (LRB): LEFT TOTAL KNEE ARTHROPLASTY (Left) Patient reports pain as moderate.    Objective: Vital signs in last 24 hours: Temp:  [97.4 F (36.3 C)-98.4 F (36.9 C)] 98.4 F (36.9 C) (09/25 0530) Pulse Rate:  [66-82] 73 (09/25 0530) Resp:  [16-20] 17 (09/25 0530) BP: (125-150)/(52-71) 142/64 (09/25 0530) SpO2:  [92 %-99 %] 99 % (09/25 0530) FiO2 (%):  [3 %] 3 % (09/24 1717) Weight:  [118.4 kg-121.6 kg] 118.4 kg (09/24 1717)  Intake/Output from previous day: 09/24 0701 - 09/25 0700 In: 1650 [P.O.:50; I.V.:1600] Out: 1100 [Urine:900; Blood:200] Intake/Output this shift: No intake/output data recorded.  Recent Labs    01/27/18 0414  HGB 10.0*   Recent Labs    01/27/18 0414  WBC 14.4*  RBC 3.72*  HCT 32.1*  PLT 278   Recent Labs    01/27/18 0414  NA 133*  K 5.0  CL 100  CO2 22  BUN 26*  CREATININE 1.55*  GLUCOSE 286*  CALCIUM 8.6*   No results for input(s): LABPT, INR in the last 72 hours.  Sensation intact distally Intact pulses distally Dorsiflexion/Plantar flexion intact Incision: dressing C/D/I Compartment soft  Assessment/Plan: 1 Day Post-Op Procedure(s) (LRB): LEFT TOTAL KNEE ARTHROPLASTY (Left) Up with therapy    Mcarthur Rossetti 01/27/2018, 7:07 AM

## 2018-01-27 NOTE — Anesthesia Postprocedure Evaluation (Signed)
Anesthesia Post Note  Patient: Yvonne Miller  Procedure(s) Performed: LEFT TOTAL KNEE ARTHROPLASTY (Left Knee)     Patient location during evaluation: PACU Anesthesia Type: General Level of consciousness: awake and alert Pain management: pain level controlled Vital Signs Assessment: post-procedure vital signs reviewed and stable Respiratory status: spontaneous breathing, nonlabored ventilation, respiratory function stable and patient connected to nasal cannula oxygen Cardiovascular status: blood pressure returned to baseline and stable Postop Assessment: no apparent nausea or vomiting Anesthetic complications: no    Last Vitals:  Vitals:   01/27/18 0934 01/27/18 0940  BP:  (!) 143/57  Pulse: 70 66  Resp:  20  Temp: 36.7 C 36.7 C  SpO2:      Last Pain:  Vitals:   01/27/18 0940  TempSrc: Oral  PainSc:                  Tiajuana Amass

## 2018-01-27 NOTE — Evaluation (Signed)
Physical Therapy Evaluation Patient Details Name: Yvonne Miller MRN: [id number] DOB: 01-08-1951 Today's Date: 01/27/2018   History of Present Illness  Pt is a 67 y/o female s/p elective L TKA. PMH including but not limited to CKD, DM, HTN, AICD placement, carpal tunnel bilaterally.    Clinical Impression  Pt presented supine in bed with HOB elevated, awake and willing to participate in therapy session. Prior to admission, pt reported that she was ambulating with use of cane and independent with ADLs. Pt lives with her husband and daughter in a two level house with one step to enter. Pt currently requires min A for bed mobility, min-mod A for transfers and min guard for ambulation. PT will continue to follow acutely to progress mobility as tolerated. Pt will need to participate in stair training when appropriate prior to d/c home.  Pt would continue to benefit from skilled physical therapy services at this time while admitted and after d/c to address the below listed limitations in order to improve overall safety and independence with functional mobility.     Follow Up Recommendations Home health PT;Supervision/Assistance - 24 hour    Equipment Recommendations  Rolling walker with 5" wheels;3in1 (PT)    Recommendations for Other Services       Precautions / Restrictions Precautions Precautions: Fall;Knee Precaution Comments: reviewing positioning of knee following TKA surgery with pt throughout Restrictions Weight Bearing Restrictions: Yes LLE Weight Bearing: Weight bearing as tolerated      Mobility  Bed Mobility Overal bed mobility: Needs Assistance Bed Mobility: Supine to Sit     Supine to sit: Min assist     General bed mobility comments: very minimal assistance with L LE at EOB to achieve upright sitting  Transfers Overall transfer level: Needs assistance Equipment used: Rolling walker (2 wheeled) Transfers: Sit to/from Stand Sit to Stand: Min assist;Mod assist          General transfer comment: pt was very particular about her transfers and technique despite cueing from therapist. She was safe; however, wanted to pull on therapist with one UE and push up from bed with other UE. Min A required to stand from EOB, mod A required to stand from toilet  Ambulation/Gait Ambulation/Gait assistance: Min guard Gait Distance (Feet): 40 Feet Assistive device: Rolling walker (2 wheeled) Gait Pattern/deviations: Step-to pattern;Step-through pattern;Decreased step length - right;Decreased step length - left;Decreased stride length;Decreased weight shift to left;Antalgic Gait velocity: decreased Gait velocity interpretation: <1.31 ft/sec, indicative of household ambulator General Gait Details: pt steady with RW, no instability or LOB, min guard for safety; pt using mostly a step-to pattern with occasional step-through towards end of ambulation  Stairs            Wheelchair Mobility    Modified Rankin (Stroke Patients Only)       Balance Overall balance assessment: Needs assistance Sitting-balance support: Feet supported Sitting balance-Leahy Scale: Good     Standing balance support: During functional activity;Bilateral upper extremity supported Standing balance-Leahy Scale: Poor                               Pertinent Vitals/Pain Pain Assessment: Faces Faces Pain Scale: Hurts little more Pain Location: L knee Pain Descriptors / Indicators: Sore;Guarding Pain Intervention(s): Monitored during session;Repositioned    Home Living Family/patient expects to be discharged to:: Private residence Living Arrangements: Spouse/significant other;Children Available Help at Discharge: Family;Available 24 hours/day Type of Home: House Home Access:  Stairs to enter   [employer] of Steps: 1 Home Layout: Two level;Bed/bath upstairs Home Equipment: Cane - single point      Prior Function Level of Independence: Independent with  assistive device(s)         Comments: pt was ambulating with use of cane     Hand Dominance        Extremity/Trunk Assessment   Upper Extremity Assessment Upper Extremity Assessment: Overall WFL for tasks assessed    Lower Extremity Assessment Lower Extremity Assessment: LLE deficits/detail LLE Deficits / Details: pt with decreased strength and ROM limitations secondary to post-op pain and weakness. Pt with noteable edema at knee and lower leg/foot. Ice applied at end of session LLE: Unable to fully assess due to pain       Communication   Communication: No difficulties  Cognition Arousal/Alertness: Awake/alert Behavior During Therapy: WFL for tasks assessed/performed Overall Cognitive Status: Within Functional Limits for tasks assessed                                        General Comments      Exercises Total Joint Exercises Ankle Circles/Pumps: AROM;Both;10 reps;Seated Long Arc Quad: AAROM;Left;10 reps;Seated Knee Flexion: AAROM;Left;10 reps;Seated   Assessment/Plan    PT Assessment Patient needs continued PT services  PT Problem List Decreased strength;Decreased range of motion;Decreased balance;Decreased mobility;Decreased activity tolerance;Decreased coordination;Decreased knowledge of use of DME;Decreased safety awareness;Decreased knowledge of precautions;Pain       PT Treatment Interventions DME instruction;Stair training;Gait training;Functional mobility training;Therapeutic activities;Therapeutic exercise;Balance training;Neuromuscular re-education;Patient/family education    PT Goals (Current goals can be found in the Care Plan section)  Acute Rehab PT Goals Patient Stated Goal: return home ASAP, heal from surgery and then have her L wrist operation PT Goal Formulation: With patient Time For Goal Achievement: 02/10/18 Potential to Achieve Goals: Good    Frequency 7X/week   Barriers to discharge        Co-evaluation                AM-PAC PT "6 Clicks" Daily Activity  Outcome Measure Difficulty turning over in bed (including adjusting bedclothes, sheets and blankets)?: Unable Difficulty moving from lying on back to sitting on the side of the bed? : Unable Difficulty sitting down on and standing up from a chair with arms (e.g., wheelchair, bedside commode, etc,.)?: Unable Help needed moving to and from a bed to chair (including a wheelchair)?: A Little Help needed walking in hospital room?: A Little Help needed climbing 3-5 steps with a railing? : A Lot 6 Click Score: 11    End of Session   Activity Tolerance: Patient limited by pain Patient left: in chair;with call bell/phone within reach;Other (comment)(nurse tech present) Nurse Communication: Mobility status PT Visit Diagnosis: Other abnormalities of gait and mobility (R26.89);Pain Pain - Right/Left: Left Pain - part of body: Knee    Time: 4163-8453 PT Time Calculation (min) (ACUTE ONLY): 38 min   Charges:   PT Evaluation $PT Eval Moderate Complexity: 1 Mod PT Treatments $Gait Training: 8-22 mins $Therapeutic Activity: 8-22 mins        Sherie Don, PT, DPT  Acute Rehabilitation Services Pager [phone] Office [place] 01/27/2018, 10:45 AM

## 2018-01-27 NOTE — Care Management Note (Signed)
Case Management Note  Patient Details  Name: Yvonne Miller MRN: [id number] Date of Birth: 21-Mar-1951  Subjective/Objective: 67 yr old female s/p left total knee arthroplasty.                 Action/Plan: Case manager spoke with patient concerning discharge plan and DME. Patient was preoperatively setup with [facility], no changes. Patient will have support of husband and family at discharge.   Expected Discharge Date:  01/29/18               Expected Discharge Plan:  [place]  In-House Referral:  NA  Discharge planning Services  CM Consult  Post Acute Care Choice:  Home Health, Durable Medical Equipment Choice offered to:  Patient  DME Arranged:  3-N-1, Walker rolling DME Agency:  [place]:  PT [place]:  [facility] (formerly [facility])  Status of Service:  Completed, signed off  If discussed at [employer], dates discussed:    Additional Comments:  Ninfa Meeker, RN 01/27/2018, 11:40 AM

## 2018-01-27 NOTE — Progress Notes (Signed)
Physical Therapy Treatment Patient Details Name: Yvonne Miller MRN: [id number] DOB: 06-Dec-1950 Today's Date: 01/27/2018    History of Present Illness Pt is a 67 y/o female s/p elective L TKA. PMH including but not limited to CKD, DM, HTN, AICD placement, carpal tunnel bilaterally.    PT Comments    Pt making steady progress with functional mobility and tolerated ambulating a further distance this session. Pt remains very slow with ambulation and limited by pain, but steady with RW and min guard. Pt would continue to benefit from skilled physical therapy services at this time while admitted and after d/c to address the below listed limitations in order to improve overall safety and independence with functional mobility.   Follow Up Recommendations  Home health PT;Supervision/Assistance - 24 hour     Equipment Recommendations  Rolling walker with 5" wheels;3in1 (PT)    Recommendations for Other Services       Precautions / Restrictions Precautions Precautions: Fall;Knee Precaution Comments: reviewed positioning of knee following TKA surgery with pt throughout Restrictions Weight Bearing Restrictions: Yes LLE Weight Bearing: Weight bearing as tolerated    Mobility  Bed Mobility               General bed mobility comments: pt OOB in recliner chair upon arriva  Transfers Overall transfer level: Needs assistance Equipment used: Rolling walker (2 wheeled) Transfers: Sit to/from Stand Sit to Stand: Min assist         General transfer comment: min A for stability with transition into standing from recliner chair; pt with greatly improved technique this session  Ambulation/Gait Ambulation/Gait assistance: Min guard Gait Distance (Feet): 75 Feet Assistive device: Rolling walker (2 wheeled) Gait Pattern/deviations: Step-to pattern;Step-through pattern;Decreased step length - right;Decreased step length - left;Decreased stride length;Decreased weight shift to  left;Antalgic Gait velocity: decreased Gait velocity interpretation: <1.31 ft/sec, indicative of household ambulator General Gait Details: pt steady with RW, no instability or LOB, min guard for safety; pt using mostly a step-to pattern with occasional step-through towards end of ambulation   Stairs             Wheelchair Mobility    Modified Rankin (Stroke Patients Only)       Balance Overall balance assessment: Needs assistance Sitting-balance support: Feet supported Sitting balance-Leahy Scale: Good     Standing balance support: During functional activity;Bilateral upper extremity supported Standing balance-Leahy Scale: Poor                              Cognition Arousal/Alertness: Awake/alert Behavior During Therapy: WFL for tasks assessed/performed Overall Cognitive Status: Within Functional Limits for tasks assessed                                        Exercises      General Comments        Pertinent Vitals/Pain Pain Assessment: Faces Faces Pain Scale: Hurts whole lot Pain Location: L knee Pain Descriptors / Indicators: Sore;Guarding Pain Intervention(s): Monitored during session;Repositioned    Home Living                      Prior Function            PT Goals (current goals can now be found in the care plan section) Acute Rehab PT Goals PT Goal Formulation: With patient  Time For Goal Achievement: 02/10/18 Potential to Achieve Goals: Good Progress towards PT goals: Progressing toward goals    Frequency    7X/week      PT Plan Current plan remains appropriate    Co-evaluation              AM-PAC PT "6 Clicks" Daily Activity  Outcome Measure  Difficulty turning over in bed (including adjusting bedclothes, sheets and blankets)?: Unable Difficulty moving from lying on back to sitting on the side of the bed? : Unable Difficulty sitting down on and standing up from a chair with arms (e.g.,  wheelchair, bedside commode, etc,.)?: Unable Help needed moving to and from a bed to chair (including a wheelchair)?: A Little Help needed walking in hospital room?: A Little Help needed climbing 3-5 steps with a railing? : A Little 6 Click Score: 12    End of Session Equipment Utilized During Treatment: Gait belt Activity Tolerance: Patient limited by pain Patient left: in chair;with call bell/phone within reach;with family/visitor present Nurse Communication: Mobility status PT Visit Diagnosis: Other abnormalities of gait and mobility (R26.89);Pain Pain - Right/Left: Left Pain - part of body: Knee     Time: 1510-1530 PT Time Calculation (min) (ACUTE ONLY): 20 min  Charges:  $Gait Training: 8-22 mins                     Sherie Don, [place], DPT  Acute Rehabilitation Services Pager [phone] Office [place] 01/27/2018, 4:42 PM

## 2018-01-27 NOTE — Discharge Instructions (Addendum)
Information on my medicine - XARELTO (Rivaroxaban)  This medication education was reviewed with me or my healthcare representative as part of my discharge preparation.  The pharmacist that spoke with me during my hospital stay was:  Sindy Guadeloupe, [facility]  Why was Xarelto prescribed for you? Xarelto was prescribed for you to reduce the risk of blood clots forming after orthopedic surgery. The medical term for these abnormal blood clots is venous thromboembolism (VTE).  What do you need to know about xarelto ? Take your Xarelto ONCE DAILY at the same time every day. You may take it either with or without food.  If you have difficulty swallowing the tablet whole, you may crush it and mix in applesauce just prior to taking your dose.  Take Xarelto exactly as prescribed by your doctor and DO NOT stop taking Xarelto without talking to the doctor who prescribed the medication.  Stopping without other VTE prevention medication to take the place of Xarelto may increase your risk of developing a clot.  After discharge, you should have regular check-up appointments with your healthcare provider that is prescribing your Xarelto.    What do you do if you miss a dose? If you miss a dose, take it as soon as you remember on the same day then continue your regularly scheduled once daily regimen the next day. Do not take two doses of Xarelto on the same day.   Important Safety Information A possible side effect of Xarelto is bleeding. You should call your healthcare provider right away if you experience any of the following: ? Bleeding from an injury or your nose that does not stop. ? Unusual colored urine (red or dark brown) or unusual colored stools (red or black). ? Unusual bruising for unknown reasons. ? A serious fall or if you hit your head (even if there is no bleeding).  Some medicines may interact with Xarelto and might increase your risk of bleeding while on Xarelto. To help avoid  this, consult your healthcare provider or pharmacist prior to using any new prescription or non-prescription medications, including herbals, vitamins, non-steroidal anti-inflammatory drugs (NSAIDs) and supplements.  This website has more information on Xarelto: [email or web address]  INSTRUCTIONS AFTER JOINT REPLACEMENT   o Remove items at home which could result in a fall. This includes throw rugs or furniture in walking pathways o ICE to the affected joint every three hours while awake for 30 minutes at a time, for at least the first 3-5 days, and then as needed for pain and swelling.  Continue to use ice for pain and swelling. You may notice swelling that will progress down to the foot and ankle.  This is normal after surgery.  Elevate your leg when you are not up walking on it.   o Continue to use the breathing machine you got in the hospital (incentive spirometer) which will help keep your temperature down.  It is common for your temperature to cycle up and down following surgery, especially at night when you are not up moving around and exerting yourself.  The breathing machine keeps your lungs expanded and your temperature down.   DIET:  As you were doing prior to hospitalization, we recommend a well-balanced diet.  DRESSING / WOUND CARE / SHOWERING  Keep the surgical dressing until follow up.  The dressing is water proof, so you can shower without any extra covering.  IF THE DRESSING FALLS OFF or the wound gets wet inside, change the dressing with sterile gauze.  Please use good hand washing techniques before changing the dressing.  Do not use any lotions or creams on the incision until instructed by your surgeon.   ° °ACTIVITY ° °o Increase activity slowly as tolerated, but follow the weight bearing instructions below.   °o No driving for 6 weeks or until further direction given by your physician.  You cannot drive while taking narcotics.  °o No lifting or carrying greater than 10 lbs. until  further directed by your surgeon. °o Avoid periods of inactivity such as sitting longer than an hour when not asleep. This helps prevent blood clots.  °o You may return to work once you are authorized by your doctor.  ° ° ° °WEIGHT BEARING  ° °Weight bearing as tolerated with assist device (walker, cane, etc) as directed, use it as long as suggested by your surgeon or therapist, typically at least 4-6 weeks. ° ° °EXERCISES ° °Results after joint replacement surgery are often greatly improved when you follow the exercise, range of motion and muscle strengthening exercises prescribed by your doctor. Safety measures are also important to protect the joint from further injury. Any time any of these exercises cause you to have increased pain or swelling, decrease what you are doing until you are comfortable again and then slowly increase them. If you have problems or questions, call your caregiver or physical therapist for advice.  ° °Rehabilitation is important following a joint replacement. After just a few days of immobilization, the muscles of the leg can become weakened and shrink (atrophy).  These exercises are designed to build up the tone and strength of the thigh and leg muscles and to improve motion. Often times heat used for twenty to thirty minutes before working out will loosen up your tissues and help with improving the range of motion but do not use heat for the first two weeks following surgery (sometimes heat can increase post-operative swelling).  ° °These exercises can be done on a training (exercise) mat, on the floor, on a table or on a bed. Use whatever works the best and is most comfortable for you.    Use music or television while you are exercising so that the exercises are a pleasant break in your day. This will make your life better with the exercises acting as a break in your routine that you can look forward to.   Perform all exercises about fifteen times, three times per day or as directed.   You should exercise both the operative leg and the other leg as well. ° °Exercises include: °  °• Quad Sets - Tighten up the muscle on the front of the thigh (Quad) and hold for 5-10 seconds.   °• Straight Leg Raises - With your knee straight (if you were given a brace, keep it on), lift the leg to 60 degrees, hold for 3 seconds, and slowly lower the leg.  Perform this exercise against resistance later as your leg gets stronger.  °• Leg Slides: Lying on your back, slowly slide your foot toward your buttocks, bending your knee up off the floor (only go as far as is comfortable). Then slowly slide your foot back down until your leg is flat on the floor again.  °• Angel Wings: Lying on your back spread your legs to the side as far apart as you can without causing discomfort.  °• Hamstring Strength:  Lying on your back, push your heel against the floor with your leg straight by tightening up the   muscles of your buttocks.  Repeat, but this time bend your knee to a comfortable angle, and push your heel against the floor.  You may put a pillow under the heel to make it more comfortable if necessary.  ° °A rehabilitation program following joint replacement surgery can speed recovery and prevent re-injury in the future due to weakened muscles. Contact your doctor or a physical therapist for more information on knee rehabilitation.  ° ° °CONSTIPATION ° °Constipation is defined medically as fewer than three stools per week and severe constipation as less than one stool per week.  Even if you have a regular bowel pattern at home, your normal regimen is likely to be disrupted due to multiple reasons following surgery.  Combination of anesthesia, postoperative narcotics, change in appetite and fluid intake all can affect your bowels.  ° °YOU MUST use at least one of the following options; they are listed in order of increasing strength to get the job done.  They are all available over the counter, and you may need to use some,  POSSIBLY even all of these options:   ° °Drink plenty of fluids (prune juice may be helpful) and high fiber foods °Colace 100 mg by mouth twice a day  °Senokot for constipation as directed and as needed Dulcolax (bisacodyl), take with full glass of water  °Miralax (polyethylene glycol) once or twice a day as needed. ° °If you have tried all these things and are unable to have a bowel movement in the first 3-4 days after surgery call either your surgeon or your primary doctor.   ° °If you experience loose stools or diarrhea, hold the medications until you stool forms back up.  If your symptoms do not get better within 1 week or if they get worse, check with your doctor.  If you experience "the worst abdominal pain ever" or develop nausea or vomiting, please contact the office immediately for further recommendations for treatment. ° ° °ITCHING:  If you experience itching with your medications, try taking only a single pain pill, or even half a pain pill at a time.  You can also use Benadryl over the counter for itching or also to help with sleep.  ° °TED HOSE STOCKINGS:  Use stockings on both legs until for at least 2 weeks or as directed by physician office. They may be removed at night for sleeping. ° °MEDICATIONS:  See your medication summary on the “After Visit Summary” that nursing will review with you.  You may have some home medications which will be placed on hold until you complete the course of blood thinner medication.  It is important for you to complete the blood thinner medication as prescribed. ° °PRECAUTIONS:  If you experience chest pain or shortness of breath - call 911 immediately for transfer to the hospital emergency department.  ° °If you develop a fever greater that 101 F, purulent drainage from wound, increased redness or drainage from wound, foul odor from the wound/dressing, or calf pain - CONTACT YOUR SURGEON.   °                                                °FOLLOW-UP APPOINTMENTS:  If  you do not already have a post-op appointment, please call the office for an appointment to be seen by your surgeon.  Guidelines for how   soon to be seen are listed in your “After Visit Summary”, but are typically between 1-4 weeks after surgery. ° °OTHER INSTRUCTIONS:  ° °Knee Replacement:  Do not place pillow under knee, focus on keeping the knee straight while resting. CPM instructions: 0-90 degrees, 2 hours in the morning, 2 hours in the afternoon, and 2 hours in the evening. Place foam block, curve side up under heel at all times except when in CPM or when walking.  DO NOT modify, tear, cut, or change the foam block in any way. ° °MAKE SURE YOU:  °• Understand these instructions.  °• Get help right away if you are not doing well or get worse.  ° ° °Thank you for letting us be a part of your medical care team.  It is a privilege we respect greatly.  We hope these instructions will help you stay on track for a fast and full recovery!  ° °

## 2018-01-27 NOTE — Plan of Care (Signed)

## 2018-01-27 NOTE — Progress Notes (Signed)
Inpatient Diabetes Program Recommendations  AACE/ADA: New Consensus Statement on Inpatient Glycemic Control (2015)  Target Ranges:  Prepandial:   less than 140 mg/dL      Peak postprandial:   less than 180 mg/dL (1-2 hours)      Critically ill patients:  140 - 180 mg/dL   Lab Results  Component Value Date   GLUCAP 202 (H) 01/27/2018   HGBA1C 7.4 (H) 01/15/2018    Review of Glycemic Control Results for MILLETTE, HALBERSTAM (MRN [id number]) as of 01/27/2018 13:23  Ref. Range 01/26/2018 16:20 01/26/2018 17:25 01/26/2018 22:34 01/27/2018 06:35 01/27/2018 11:40  Glucose-Capillary Latest Ref Range: 70 - 99 mg/dL 116 (H) 152 (H) 273 (H) 259 (H) 202 (H)   Diabetes history: Type 2 DM  Outpatient Diabetes medications:  Novolin 70/30 20 units in the AM and 60 units in the PM, Januvia 100 mg daily, Metformin 500 mg with breakfast Current orders for Inpatient glycemic control:  Novolog resistant tid with meals and HS Novolog 70/30 mix 20 units in the AM and Novolog 70/30 mix 60 units q HS Inpatient Diabetes Program Recommendations:   Please change HS 70/30 to with meal at 1700 (since 70/30 includes meal coverage).  Sent message to pharmacy.  Thanks,  Adah Perl, RN, BC-ADM Inpatient Diabetes Coordinator Pager [phone] (8a-5p)

## 2018-01-27 NOTE — Evaluation (Signed)
Occupational Therapy Evaluation Patient Details Name: Yvonne Miller MRN: [id number] DOB: 02/02/1951 Today's Date: 01/27/2018    History of Present Illness Pt is a 67 y/o female s/p elective L TKA. PMH including but not limited to CKD, DM, HTN, AICD placement, carpal tunnel bilaterally.   Clinical Impression   This 67 y/o female presents with the above. At baseline pt is mod independent with ADLs and functional mobility using SPC. Pt limited mostly due to pain in LLE. She currently requires minA for toileting and standing grooming ADLs, mod-maxA for LB ADLs secondary to LLE functional limitations. Pt completing room level functional mobility using RW with minguard-minA. Pt will benefit from continued OT services while in acute setting to maximize her overall safety and independence with ADLs and mobility prior to return home. Pt reports she will return home with spouse who is able to provide assist PRN. Will follow.     Follow Up Recommendations  Follow surgeon's recommendation for DC plan and follow-up therapies;Supervision/Assistance - 24 hour    Equipment Recommendations  3 in 1 bedside commode           Precautions / Restrictions Precautions Precautions: Fall;Knee Precaution Comments: reviewing positioning of knee following TKA surgery with pt throughout Restrictions Weight Bearing Restrictions: Yes LLE Weight Bearing: Weight bearing as tolerated      Mobility Bed Mobility Overal bed mobility: Needs Assistance Bed Mobility: Supine to Sit     Supine to sit: Min assist     General bed mobility comments: very minimal assistance with L LE at EOB to achieve upright sitting  Transfers Overall transfer level: Needs assistance Equipment used: Rolling walker (2 wheeled) Transfers: Sit to/from Stand Sit to Stand: Min assist;Mod assist         General transfer comment: pt using single UE support on therapist's UE with other UE pushing from seated surface; modA for  sit<>stand from EOB, minA from toilet    Balance Overall balance assessment: Needs assistance Sitting-balance support: Feet supported Sitting balance-Leahy Scale: Good     Standing balance support: During functional activity;Bilateral upper extremity supported Standing balance-Leahy Scale: Poor                             ADL either performed or assessed with clinical judgement   ADL Overall ADL's : Needs assistance/impaired Eating/Feeding: Independent;Sitting   Grooming: Wash/dry hands;Minimal assistance;Standing Grooming Details (indicate cue type and reason): minA standing balance; pt also using support of forearms on sink Upper Body Bathing: Min guard;Sitting   Lower Body Bathing: Moderate assistance;Sit to/from stand   Upper Body Dressing : Min guard;Sitting   Lower Body Dressing: Maximal assistance;Sit to/from stand Lower Body Dressing Details (indicate cue type and reason): pt with increased difficulty reaching LEs due to pain/stiffness; minA standing balance Toilet Transfer: Minimal assistance;Ambulation;Regular Toilet;Grab bars;RW   Toileting- Clothing Manipulation and Hygiene: Minimal assistance;Sit to/from stand       Functional mobility during ADLs: Minimal assistance;Rolling walker                           Pertinent Vitals/Pain Pain Assessment: 0-10 Pain Score: 7  Faces Pain Scale: Hurts whole lot Pain Location: L knee Pain Descriptors / Indicators: Sore;Guarding Pain Intervention(s): Monitored during session;Limited activity within patient's tolerance     Hand Dominance     Extremity/Trunk Assessment Upper Extremity Assessment Upper Extremity Assessment: Overall WFL for tasks assessed  Lower Extremity Assessment Lower Extremity Assessment: Defer to PT evaluation       Communication Communication Communication: No difficulties   Cognition Arousal/Alertness: Awake/alert Behavior During Therapy: WFL for tasks  assessed/performed Overall Cognitive Status: Within Functional Limits for tasks assessed                                     General Comments       Exercises     Shoulder Instructions      Home Living Family/patient expects to be discharged to:: Private residence Living Arrangements: Spouse/significant other;Children Available Help at Discharge: Family;Available 24 hours/day Type of Home: House Home Access: Stairs to enter [employer] of Steps: 1   Home Layout: Two level;Bed/bath upstairs Alternate Level Stairs-Number of Steps: 16 Alternate Level Stairs-Rails: Right;Left Bathroom Shower/Tub: Occupational psychologist: Standard     Home Equipment: Cane - single point          Prior Functioning/Environment Level of Independence: Independent with assistive device(s)        Comments: pt was ambulating with use of cane        OT Problem List: Decreased strength;Decreased range of motion;Decreased activity tolerance;Impaired balance (sitting and/or standing);Decreased knowledge of use of DME or AE;Pain      OT Treatment/Interventions: Self-care/ADL training;Therapeutic exercise;DME and/or AE instruction;Therapeutic activities;Patient/family education    OT Goals(Current goals can be found in the care plan section) Acute Rehab OT Goals Patient Stated Goal: less pain, regain independence OT Goal Formulation: With patient Time For Goal Achievement: 02/10/18 Potential to Achieve Goals: Good  OT Frequency: Min 2X/week   Barriers to D/C:            Co-evaluation              AM-PAC PT "6 Clicks" Daily Activity     Outcome Measure Help from another person eating meals?: None Help from another person taking care of personal grooming?: A Little Help from another person toileting, which includes using toliet, bedpan, or urinal?: A Little Help from another person bathing (including washing, rinsing, drying)?: A Little Help from  another person to put on and taking off regular upper body clothing?: A Lot   6 Click Score: 15   End of Session Equipment Utilized During Treatment: Rolling walker;Gait belt Nurse Communication: Mobility status  Activity Tolerance: Patient tolerated treatment well Patient left: in chair;with call bell/phone within reach;with family/visitor present  OT Visit Diagnosis: Other abnormalities of gait and mobility (R26.89);Pain Pain - Right/Left: Left Pain - part of body: Knee                Time: 3094-0768 OT Time Calculation (min): 20 min Charges:  OT General Charges $OT Visit: 1 Visit OT Evaluation $OT Eval Low Complexity: [place], OT [facility] Pager [phone] Office [phone]  Raymondo Band 01/27/2018, 5:11 PM

## 2018-01-27 NOTE — Progress Notes (Signed)
CSW received consult for SNF.   RNCM reports patient is set up with [facility], with support of husband and family at discharge.   No social work needs identified,  [place] signing off.    [place], [place]

## 2018-01-28 LAB — GLUCOSE, CAPILLARY
Glucose-Capillary: 93 mg/dL (ref 70–99)
Glucose-Capillary: 125 mg/dL — ABNORMAL HIGH (ref 70–99)
Glucose-Capillary: 149 mg/dL — ABNORMAL HIGH (ref 70–99)
Glucose-Capillary: 66 mg/dL — ABNORMAL LOW (ref 70–99)
Glucose-Capillary: 89 mg/dL (ref 70–99)

## 2018-01-28 MED ORDER — OXYCODONE HCL 5 MG PO TABS: 5.0000 mg | ORAL_TABLET | ORAL | 0 refills | Status: DC | PRN

## 2018-01-28 MED ORDER — METHOCARBAMOL 500 MG PO TABS: 500.0000 mg | ORAL_TABLET | Freq: Four times a day (QID) | ORAL | 0 refills | Status: DC | PRN

## 2018-01-28 MED ORDER — RIVAROXABAN 10 MG PO TABS: 10.0000 mg | ORAL_TABLET | Freq: Every day | ORAL | 0 refills | Status: DC

## 2018-01-28 NOTE — Progress Notes (Signed)
OT Cancellation Note  Patient Details Name: Yvonne Miller MRN: [id number] DOB: Apr 02, 1951   Cancelled Treatment:    Reason Eval/Treat Not Completed: Other (comment); pt just getting back to bed upon entering room having been up in chair, politely requesting to rest at this time. Will follow up as schedule permits.   Lou Cal, OT [facility] Pager [phone] Office [phone]  Raymondo Band 01/28/2018, 3:33 PM

## 2018-01-28 NOTE — Progress Notes (Signed)
Physical Therapy Treatment Patient Details Name: Yvonne Miller MRN: [id number] DOB: November 21, 1950 Today's Date: 01/28/2018    History of Present Illness Pt is a 67 y/o female s/p elective L TKA. PMH including but not limited to CKD, DM, HTN, AICD placement, carpal tunnel bilaterally.    PT Comments    Initiated stair training this session. Pt able to ascend/descend two steps with bilateral hand rails and min guard for safety. PT also provided cueing for sequencing and technique. Pt would continue to benefit from skilled physical therapy services at this time while admitted and after d/c to address the below listed limitations in order to improve overall safety and independence with functional mobility.    Follow Up Recommendations  Home health PT;Supervision/Assistance - 24 hour     Equipment Recommendations  Rolling walker with 5" wheels;3in1 (PT)    Recommendations for Other Services       Precautions / Restrictions Precautions Precautions: Fall;Knee Precaution Comments: reviewed positioning of knee following TKA surgery with pt throughout Restrictions Weight Bearing Restrictions: Yes LLE Weight Bearing: Weight bearing as tolerated    Mobility  Bed Mobility Overal bed mobility: Needs Assistance Bed Mobility: Supine to Sit     Supine to sit: Mod assist     General bed mobility comments: increased time and effort, assist with L LE movement off of bed and for hip positioning at EOB  Transfers Overall transfer level: Needs assistance Equipment used: Rolling walker (2 wheeled) Transfers: Sit to/from Stand Sit to Stand: Min assist         General transfer comment: increased time and effort, cueing for technique, min A to power into standing and for safety; x1 from EOB and x1 from recliner chair  Ambulation/Gait Ambulation/Gait assistance: Min guard Gait Distance (Feet): 10 Feet Assistive device: Rolling walker (2 wheeled) Gait Pattern/deviations: Step-to  pattern;Step-through pattern;Decreased step length - right;Decreased step length - left;Decreased stride length;Decreased weight shift to left;Antalgic Gait velocity: decreased Gait velocity interpretation: <1.31 ft/sec, indicative of household ambulator General Gait Details: focus of session was on initiation of stair training   Stairs Stairs: Yes Stairs assistance: Min guard Stair Management: Two rails;Step to pattern;Forwards Number of Stairs: 2 General stair comments: min guard for safety; demonstration provided; cueing for sequencing and technique   Wheelchair Mobility    Modified Rankin (Stroke Patients Only)       Balance Overall balance assessment: Needs assistance Sitting-balance support: Feet supported Sitting balance-Leahy Scale: Good     Standing balance support: During functional activity;Bilateral upper extremity supported Standing balance-Leahy Scale: Poor                              Cognition Arousal/Alertness: Awake/alert Behavior During Therapy: WFL for tasks assessed/performed Overall Cognitive Status: Within Functional Limits for tasks assessed                                        Exercises Total Joint Exercises Ankle Circles/Pumps: AROM;Both;20 reps;Supine Quad Sets: AROM;Strengthening;Left;10 reps;Supine Gluteal Sets: AROM;Strengthening;Both;10 reps;Supine Heel Slides: AAROM;Left;10 reps;Supine Hip ABduction/ADduction: AAROM;Left;10 reps;Supine Goniometric ROM: Flexion = 50 degrees; Extension = lacking 15 degrees to neutral    General Comments        Pertinent Vitals/Pain Pain Assessment: Faces Faces Pain Scale: Hurts even more Pain Location: L knee Pain Descriptors / Indicators: Sore;Guarding Pain Intervention(s): Monitored during session;Repositioned  Home Living                      Prior Function            PT Goals (current goals can now be found in the care plan section) Acute Rehab  PT Goals PT Goal Formulation: With patient Time For Goal Achievement: 02/10/18 Potential to Achieve Goals: Good Progress towards PT goals: Progressing toward goals    Frequency    7X/week      PT Plan Current plan remains appropriate    Co-evaluation              AM-PAC PT "6 Clicks" Daily Activity  Outcome Measure  Difficulty turning over in bed (including adjusting bedclothes, sheets and blankets)?: Unable Difficulty moving from lying on back to sitting on the side of the bed? : Unable Difficulty sitting down on and standing up from a chair with arms (e.g., wheelchair, bedside commode, etc,.)?: Unable Help needed moving to and from a bed to chair (including a wheelchair)?: A Little Help needed walking in hospital room?: A Little Help needed climbing 3-5 steps with a railing? : A Little 6 Click Score: 12    End of Session Equipment Utilized During Treatment: Gait belt Activity Tolerance: Patient limited by pain Patient left: in chair;with call bell/phone within reach;with family/visitor present Nurse Communication: Mobility status PT Visit Diagnosis: Other abnormalities of gait and mobility (R26.89);Pain Pain - Right/Left: Left Pain - part of body: Knee     Time: 1355-1420 PT Time Calculation (min) (ACUTE ONLY): 25 min  Charges:  $Gait Training: 23-37 mins                     Sherie Don, [place], DPT  Acute Rehabilitation Services Pager [phone] Office [place] 01/28/2018, 3:12 PM

## 2018-01-28 NOTE — Progress Notes (Signed)
Subjective: 2 Days Post-Op Procedure(s) (LRB): LEFT TOTAL KNEE ARTHROPLASTY (Left) Patient reports pain as moderate.  Working on mobility with therapy.  Objective: Vital signs in last 24 hours: Temp:  [98.1 F (36.7 C)-98.2 F (36.8 C)] 98.1 F (36.7 C) (09/26 0524) Pulse Rate:  [66-78] 78 (09/26 0524) Resp:  [20] 20 (09/25 2111) BP: (121-143)/(53-59) 121/59 (09/26 0524) SpO2:  [94 %-97 %] 97 % (09/26 0524)  Intake/Output from previous day: 09/25 0701 - 09/26 0700 In: 1357.3 [P.O.:360; I.V.:997.3] Out: -  Intake/Output this shift: No intake/output data recorded.  Recent Labs    01/27/18 0414  HGB 10.0*   Recent Labs    01/27/18 0414  WBC 14.4*  RBC 3.72*  HCT 32.1*  PLT 278   Recent Labs    01/27/18 0414  NA 133*  K 5.0  CL 100  CO2 22  BUN 26*  CREATININE 1.55*  GLUCOSE 286*  CALCIUM 8.6*   No results for input(s): LABPT, INR in the last 72 hours.  Sensation intact distally Intact pulses distally Dorsiflexion/Plantar flexion intact Incision: scant drainage No cellulitis present Compartment soft  Anticipated LOS equal to or greater than 2 midnights due to - Age 51 and older with one or more of the following:  - Obesity  - Expected need for hospital services (PT, OT, Nursing) required for safe  discharge  - Anticipated need for postoperative skilled nursing care or inpatient rehab  - Active co-morbidities: Diabetes OR   - Unanticipated findings during/Post Surgery: Slow post-op progression: GI, pain control, mobility  - Patient is a high risk of re-admission due to: None   Assessment/Plan: 2 Days Post-Op Procedure(s) (LRB): LEFT TOTAL KNEE ARTHROPLASTY (Left) Up with therapy Plan for discharge tomorrow Discharge home with home health    Mcarthur Rossetti 01/28/2018, 7:04 AM

## 2018-01-28 NOTE — Plan of Care (Signed)

## 2018-01-28 NOTE — Progress Notes (Signed)
Physical Therapy Treatment Patient Details Name: Yvonne Miller MRN: [id number] DOB: [birth year] Today's Date: 01/28/2018    History of Present Illness Pt is a 67 y/o female s/p elective L TKA. PMH including but not limited to CKD, DM, HTN, AICD placement, carpal tunnel bilaterally.    PT Comments    Pt very limited this morning secondary to increased pain and soreness. Pt tolerated therex and short distance ambulation within her room. Will attempt stair training at next session if appropriate. Pt would continue to benefit from skilled physical therapy services at this time while admitted and after d/c to address the below listed limitations in order to improve overall safety and independence with functional mobility.    Follow Up Recommendations  Home health PT;Supervision/Assistance - 24 hour     Equipment Recommendations  Rolling walker with 5" wheels;3in1 (PT)    Recommendations for Other Services       Precautions / Restrictions Precautions Precautions: Fall;Knee Precaution Comments: reviewed positioning of knee following TKA surgery with pt throughout Restrictions Weight Bearing Restrictions: Yes LLE Weight Bearing: Weight bearing as tolerated    Mobility  Bed Mobility Overal bed mobility: Needs Assistance Bed Mobility: Supine to Sit     Supine to sit: Min assist     General bed mobility comments: min A with L LE movement off of bed, cueing for technique  Transfers Overall transfer level: Needs assistance Equipment used: Rolling walker (2 wheeled) Transfers: Sit to/from Stand Sit to Stand: Min assist         General transfer comment: increased time and effort, cueing for technique, min A to power into standing and for safety  Ambulation/Gait Ambulation/Gait assistance: Min guard Gait Distance (Feet): 30 Feet Assistive device: Rolling walker (2 wheeled) Gait Pattern/deviations: Step-to pattern;Step-through pattern;Decreased step length - right;Decreased  step length - left;Decreased stride length;Decreased weight shift to left;Antalgic Gait velocity: decreased Gait velocity interpretation: <1.31 ft/sec, indicative of household ambulator General Gait Details: pt moving very slowly, steady with RW, required frequent standing rest breaks secondary to pain and fatigue   Stairs             Wheelchair Mobility    Modified Rankin (Stroke Patients Only)       Balance Overall balance assessment: Needs assistance Sitting-balance support: Feet supported Sitting balance-Leahy Scale: Good     Standing balance support: During functional activity;Bilateral upper extremity supported Standing balance-Leahy Scale: Poor                              Cognition Arousal/Alertness: Awake/alert Behavior During Therapy: WFL for tasks assessed/performed Overall Cognitive Status: Within Functional Limits for tasks assessed                                        Exercises Total Joint Exercises Ankle Circles/Pumps: AROM;Both;20 reps;Supine Quad Sets: AROM;Strengthening;Left;10 reps;Supine Gluteal Sets: AROM;Strengthening;Both;10 reps;Supine Heel Slides: AAROM;Left;10 reps;Supine Hip ABduction/ADduction: AAROM;Left;10 reps;Supine    General Comments        Pertinent Vitals/Pain Pain Assessment: Faces Faces Pain Scale: Hurts even more Pain Location: L knee Pain Descriptors / Indicators: Sore;Guarding Pain Intervention(s): Monitored during session;Repositioned    Home Living                      Prior Function  PT Goals (current goals can now be found in the care plan section) Acute Rehab PT Goals PT Goal Formulation: With patient Time For Goal Achievement: 02/10/18 Potential to Achieve Goals: Good Progress towards PT goals: Progressing toward goals    Frequency    7X/week      PT Plan Current plan remains appropriate    Co-evaluation              AM-PAC PT "6  Clicks" Daily Activity  Outcome Measure  Difficulty turning over in bed (including adjusting bedclothes, sheets and blankets)?: Unable Difficulty moving from lying on back to sitting on the side of the bed? : Unable Difficulty sitting down on and standing up from a chair with arms (e.g., wheelchair, bedside commode, etc,.)?: Unable Help needed moving to and from a bed to chair (including a wheelchair)?: A Little Help needed walking in hospital room?: A Little Help needed climbing 3-5 steps with a railing? : A Little 6 Click Score: 12    End of Session Equipment Utilized During Treatment: Gait belt Activity Tolerance: Patient limited by pain Patient left: in chair;with call bell/phone within reach Nurse Communication: Mobility status PT Visit Diagnosis: Other abnormalities of gait and mobility (R26.89);Pain Pain - Right/Left: Left Pain - part of body: Knee     Time: 1037-1100 PT Time Calculation (min) (ACUTE ONLY): 23 min  Charges:  $Gait Training: 8-22 mins $Therapeutic Exercise: 8-22 mins                     Sherie Don, PT, DPT  Acute Rehabilitation Services Pager [phone] Office [place] 01/28/2018, 12:51 PM

## 2018-01-29 LAB — GLUCOSE, CAPILLARY
Glucose-Capillary: 150 mg/dL — ABNORMAL HIGH (ref 70–99)
Glucose-Capillary: 122 mg/dL — ABNORMAL HIGH (ref 70–99)
Glucose-Capillary: 94 mg/dL (ref 70–99)

## 2018-01-29 NOTE — Progress Notes (Signed)
Occupational Therapy Treatment Patient Details Name: Yvonne Miller MRN: [id number] DOB: 07-Jun-1950 Today's Date: 01/29/2018    History of present illness Pt is a 67 y/o female s/p elective L TKA. PMH including but not limited to CKD, DM, HTN, AICD placement, carpal tunnel bilaterally.   OT comments  Pt progressing towards OT goals, presents supine in bed pleasant and willing to participate in therapy session. Pt demonstrating room level functional mobility, walk-in shower transfer, toileting and standing grooming ADLs using RW with minA-minguard assist throughout. Pt continues to require mod-maxA for LB ADLs secondary to LLE pain/stiffness. Pt reports spouse will be able to assist with LB ADL tasks PRN after return home. Will continue to follow acutely to progress pt towards established OT goals.   Follow Up Recommendations  Follow surgeon's recommendation for DC plan and follow-up therapies;Supervision/Assistance - 24 hour    Equipment Recommendations  3 in 1 bedside commode          Precautions / Restrictions Precautions Precautions: Fall;Knee Precaution Comments: reviewed positioning of knee following TKA surgery with pt throughout Restrictions Weight Bearing Restrictions: Yes LLE Weight Bearing: Weight bearing as tolerated       Mobility Bed Mobility Overal bed mobility: Needs Assistance Bed Mobility: Supine to Sit;Sit to Supine     Supine to sit: Min assist Sit to supine: Min assist   General bed mobility comments: increased time and effort, assist with L LE movement off of and onto bed and for hip positioning at EOB  Transfers Overall transfer level: Needs assistance Equipment used: Rolling walker (2 wheeled) Transfers: Sit to/from Stand Sit to Stand: Min guard;Min assist         General transfer comment: increased time and effort, use of momentum, good hand placement, minA to rise from EOB, minguard from toilet    Balance Overall balance assessment: Needs  assistance Sitting-balance support: Feet supported Sitting balance-Leahy Scale: Good     Standing balance support: During functional activity;Bilateral upper extremity supported Standing balance-Leahy Scale: Poor Standing balance comment: reliant on UE support on RW; use of forearms for balance during standing grooming tasks                           ADL either performed or assessed with clinical judgement   ADL Overall ADL's : Needs assistance/impaired     Grooming: Wash/dry hands;Min guard;Standing               Lower Body Dressing: Maximal assistance;Sit to/from stand Lower Body Dressing Details (indicate cue type and reason): pt requiring assist to don socks seated EOB this session; reports spouse will be able to assist with this at home; [place] standing balance Toilet Transfer: Minimal assistance;Min guard;Ambulation;Comfort height toilet;Grab bars;RW   Toileting- Clothing Manipulation and Hygiene: Min guard;Sitting/lateral lean Toileting - Clothing Manipulation Details (indicate cue type and reason): for peri-care and gown management Tub/ Shower Transfer: Walk-in shower;Minimal assistance;Min guard;Ambulation;3 in 1;Rolling walker Tub/Shower Transfer Details (indicate cue type and reason): close minguard-minA for safe transfer completion; recommend pt complete dry run of shower transfer with [facility] therapies after return home for increased practice; pt reports she will most likely sponge bathe initially after return home as her shower is on the second level and she does not plan to ascend stairs for first couple of days  Functional mobility during ADLs: Min guard;Minimal assistance;Rolling walker       Vision       Perception  Praxis      Cognition Arousal/Alertness: Awake/alert Behavior During Therapy: WFL for tasks assessed/performed Overall Cognitive Status: Within Functional Limits for tasks assessed                                                            Pertinent Vitals/ Pain       Pain Assessment: Faces Faces Pain Scale: Hurts even more Pain Location: L knee Pain Descriptors / Indicators: Sore;Guarding Pain Intervention(s): Monitored during session;Limited activity within patient's tolerance;Repositioned  Home Living                                          Prior Functioning/Environment              Frequency  Min 2X/week        Progress Toward Goals  OT Goals(current goals can now be found in the care plan section)  Progress towards OT goals: Progressing toward goals  Acute Rehab OT Goals Patient Stated Goal: less pain, regain independence OT Goal Formulation: With patient Time For Goal Achievement: 02/10/18 Potential to Achieve Goals: Good  Plan Discharge plan remains appropriate    Co-evaluation                 AM-PAC PT "6 Clicks" Daily Activity     Outcome Measure   Help from another person eating meals?: None Help from another person taking care of personal grooming?: A Little Help from another person toileting, which includes using toliet, bedpan, or urinal?: A Little Help from another person bathing (including washing, rinsing, drying)?: A Little Help from another person to put on and taking off regular upper body clothing?: A Little Help from another person to put on and taking off regular lower body clothing?: A Lot 6 Click Score: 18    End of Session Equipment Utilized During Treatment: Rolling walker;Gait belt  OT Visit Diagnosis: Other abnormalities of gait and mobility (R26.89);Pain Pain - Right/Left: Left Pain - part of body: Knee   Activity Tolerance Patient tolerated treatment well   Patient Left in bed;with call bell/phone within reach   Nurse Communication Mobility status        Time: 3419-6222 OT Time Calculation (min): 32 min  Charges: OT General Charges $OT Visit: 1 Visit OT Treatments $Self Care/Home  Management : 23-37 mins  Lou Cal, OT Supplemental Rehabilitation Services Pager [phone] Office [phone]   Raymondo Band 01/29/2018, 3:16 PM

## 2018-01-29 NOTE — Progress Notes (Signed)
Physical Therapy Treatment Patient Details Name: Yvonne Miller MRN: [id number] DOB: [birth year] Today's Date: 01/29/2018    History of Present Illness Pt is a 67 y/o female s/p elective L TKA. PMH including but not limited to CKD, DM, HTN, AICD placement, carpal tunnel bilaterally.    PT Comments    Focus of second session was stair training. Pt making good progress overall. Plan is to d/c home with family support. Pt is ready for d/c from PT perspective.  Pt would continue to benefit from skilled physical therapy services at this time while admitted and after d/c to address the below listed limitations in order to improve overall safety and independence with functional mobility.    Follow Up Recommendations  Home health PT;Supervision/Assistance - 24 hour     Equipment Recommendations  Rolling walker with 5" wheels;3in1 (PT)    Recommendations for Other Services       Precautions / Restrictions Precautions Precautions: Fall;Knee Precaution Comments: reviewed positioning of knee following TKA surgery with pt throughout Restrictions Weight Bearing Restrictions: Yes LLE Weight Bearing: Weight bearing as tolerated    Mobility  Bed Mobility Overal bed mobility: Needs Assistance Bed Mobility: Supine to Sit;Sit to Supine     Supine to sit: Min assist Sit to supine: Min assist   General bed mobility comments: increased time and effort, assist with L LE movement off of and onto bed and for hip positioning at EOB  Transfers Overall transfer level: Needs assistance Equipment used: Rolling walker (2 wheeled) Transfers: Sit to/from Stand Sit to Stand: Min guard         General transfer comment: increased time and effort, use of momentum, good hand placement, min guard for safety  Ambulation/Gait Ambulation/Gait assistance: Min guard Gait Distance (Feet): 2 Feet Assistive device: Rolling walker (2 wheeled) Gait Pattern/deviations: Step-to pattern;Step-through  pattern;Decreased step length - right;Decreased step length - left;Decreased stride length;Decreased weight shift to left;Antalgic Gait velocity: decreased Gait velocity interpretation: <1.31 ft/sec, indicative of household ambulator General Gait Details: focus of session was on stair training   Stairs Stairs: Yes Stairs assistance: Min guard Stair Management: No rails;Step to pattern;Backwards;With walker Number of Stairs: 1(x2 trials) General stair comments: min guard for safety; demonstration provided; cueing for sequencing and technique   Wheelchair Mobility    Modified Rankin (Stroke Patients Only)       Balance Overall balance assessment: Needs assistance Sitting-balance support: Feet supported Sitting balance-Leahy Scale: Good     Standing balance support: During functional activity;Bilateral upper extremity supported Standing balance-Leahy Scale: Poor                              Cognition Arousal/Alertness: Awake/alert Behavior During Therapy: WFL for tasks assessed/performed Overall Cognitive Status: Within Functional Limits for tasks assessed                                        Exercises Total Joint Exercises Long Arc Quad: AAROM;Left;10 reps;Seated Knee Flexion: AAROM;Left;10 reps;Seated Goniometric ROM: Flexion = 70 degrees; Extension = lacking 20 degrees to neutral    General Comments        Pertinent Vitals/Pain Pain Assessment: Faces Pain Score: 5  Faces Pain Scale: Hurts even more Pain Location: L knee Pain Descriptors / Indicators: Sore;Guarding Pain Intervention(s): Monitored during session;Repositioned    Home Living  Prior Function            PT Goals (current goals can now be found in the care plan section) Acute Rehab PT Goals PT Goal Formulation: With patient Time For Goal Achievement: 02/10/18 Potential to Achieve Goals: Good Progress towards PT goals: Progressing  toward goals    Frequency    7X/week      PT Plan Current plan remains appropriate    Co-evaluation              AM-PAC PT "6 Clicks" Daily Activity  Outcome Measure  Difficulty turning over in bed (including adjusting bedclothes, sheets and blankets)?: Unable Difficulty moving from lying on back to sitting on the side of the bed? : Unable Difficulty sitting down on and standing up from a chair with arms (e.g., wheelchair, bedside commode, etc,.)?: Unable Help needed moving to and from a bed to chair (including a wheelchair)?: A Little Help needed walking in hospital room?: A Little Help needed climbing 3-5 steps with a railing? : A Little 6 Click Score: 12    End of Session Equipment Utilized During Treatment: Gait belt Activity Tolerance: Patient limited by pain;Patient limited by fatigue Patient left: with call bell/phone within reach;in bed Nurse Communication: Mobility status PT Visit Diagnosis: Other abnormalities of gait and mobility (R26.89);Pain Pain - Right/Left: Left Pain - part of body: Knee     Time: 6761-9509 PT Time Calculation (min) (ACUTE ONLY): 27 min  Charges:  $Gait Training: 8-22 mins $Therapeutic Activity: 8-22 mins                     Sherie Don, PT, DPT  Acute Rehabilitation Services Pager [phone] Office [place] 01/29/2018, 1:43 PM

## 2018-01-29 NOTE — Discharge Summary (Signed)
Patient ID: Yvonne Miller MRN: [id number] DOB/AGE: 02-Nov-1950 67 y.o.  Admit date: 01/26/2018 Discharge date: 01/29/2018  Admission Diagnoses:  Principal Problem:   Unilateral primary osteoarthritis, left knee Active Problems:   Status post revision of total replacement of left knee   Discharge Diagnoses:  Same  Past Medical History:  Diagnosis Date  . AICD (automatic cardioverter/defibrillator) present   . Arthritis   . Asthma   . Carpal tunnel syndrome, bilateral   . CKD (chronic kidney disease)   . Diabetes mellitus   . GERD (gastroesophageal reflux disease)   . Hyperlipemia   . Hypertension   . Obesity    morbid  . Sleep apnea    wears CPAP set at 12  . Wears glasses     Surgeries: Procedure(s): LEFT TOTAL KNEE ARTHROPLASTY on 01/26/2018   Consultants:   Discharged Condition: Improved  Hospital Course: Yvonne Miller is an 67 y.o. female who was admitted 01/26/2018 for operative treatment ofUnilateral primary osteoarthritis, left knee. Patient has severe unremitting pain that affects sleep, daily activities, and work/hobbies. After pre-op clearance the patient was taken to the operating room on 01/26/2018 and underwent  Procedure(s): LEFT TOTAL KNEE ARTHROPLASTY.    Patient was given perioperative antibiotics:  Anti-infectives (From admission, onward)   Start     Dose/Rate Route Frequency Ordered Stop   01/26/18 2000  ceFAZolin (ANCEF) IVPB 2g/100 mL premix     2 g 200 mL/hr over 30 Minutes Intravenous Every 6 hours 01/26/18 1713 01/27/18 0256   01/26/18 1330  ceFAZolin (ANCEF) 3 g in dextrose 5 % 50 mL IVPB     3 g 100 mL/hr over 30 Minutes Intravenous To ShortStay Surgical 01/25/18 1325 01/26/18 1416       Patient was given sequential compression devices, early ambulation, and chemoprophylaxis to prevent DVT.  Patient benefited maximally from hospital stay and there were no complications.    Recent vital signs:  Patient Vitals for the past 24 hrs:  BP  Temp Temp src Pulse Resp SpO2  01/29/18 0355 (!) 112/40 99.3 F (37.4 C) Oral 80 16 91 %  01/28/18 2220 (!) 117/58 98.9 F (37.2 C) Oral 83 - 96 %  01/28/18 1625 (!) 151/61 - - 87 18 95 %  01/28/18 1446 136/64 98.4 F (36.9 C) Oral 85 16 98 %  01/28/18 1003 (!) 153/61 - - 78 - -  01/28/18 0755 136/62 - - 76 - -     Recent laboratory studies:  Recent Labs    01/27/18 0414  WBC 14.4*  HGB 10.0*  HCT 32.1*  PLT 278  NA 133*  K 5.0  CL 100  CO2 22  BUN 26*  CREATININE 1.55*  GLUCOSE 286*  CALCIUM 8.6*     Discharge Medications:   Allergies as of 01/29/2018      Reactions   Other Other (See Comments)   NO BLOOD   . Jehovah witness      Medication List    STOP taking these medications   aspirin 81 MG tablet   diclofenac sodium 1 % Gel Commonly known as:  VOLTAREN     TAKE these medications   albuterol 108 (90 Base) MCG/ACT inhaler Commonly known as:  PROVENTIL HFA;VENTOLIN HFA Inhale 1 puff into the lungs every 4 (four) hours as needed for wheezing or shortness of breath.   amLODipine 5 MG tablet Commonly known as:  NORVASC Take 5 mg by mouth daily.   B Complex-B12 Tabs Take 1  tablet by mouth daily.   carvedilol 25 MG tablet Commonly known as:  COREG Take 37.5 mg by mouth 2 (two) times daily with a meal.   cholecalciferol 1000 units tablet Commonly known as:  VITAMIN D Take 1,000 Units by mouth daily.   Cinnamon 500 MG capsule Take 500 mg by mouth daily.   Fish Oil 1000 MG Caps Take 1 capsule by mouth daily.   hydrALAZINE 50 MG tablet Commonly known as:  APRESOLINE Take 50 mg by mouth 2 (two) times daily.   insulin NPH-regular Human (70-30) 100 UNIT/ML injection Commonly known as:  NOVOLIN 70/30 Inject 20-60 Units into the skin See admin instructions. 20 units in the morning, and 60 units at bedtime   metFORMIN 500 MG 24 hr tablet Commonly known as:  GLUCOPHAGE-XR Take 500 mg by mouth daily with breakfast.   methocarbamol 500 MG  tablet Commonly known as:  ROBAXIN Take 1 tablet (500 mg total) by mouth every 6 (six) hours as needed for muscle spasms.   oxyCODONE 5 MG immediate release tablet Commonly known as:  Oxy IR/ROXICODONE Take 1-2 tablets (5-10 mg total) by mouth every 4 (four) hours as needed for severe pain.   pravastatin 40 MG tablet Commonly known as:  PRAVACHOL Take 40 mg by mouth every evening.   rivaroxaban 10 MG Tabs tablet Commonly known as:  XARELTO Take 1 tablet (10 mg total) by mouth daily with breakfast.   sitaGLIPtin 100 MG tablet Commonly known as:  JANUVIA Take 100 mg by mouth daily.   spironolactone 50 MG tablet Commonly known as:  ALDACTONE Take 50 mg by mouth every morning.   valsartan-hydrochlorothiazide 160-25 MG tablet Commonly known as:  DIOVAN-HCT Take 1 tablet by mouth daily.            Durable Medical Equipment  (From admission, onward)         Start     Ordered   01/26/18 1714  DME 3 n 1  Once     01/26/18 1713   01/26/18 1714  DME Walker rolling  Once    Question:  Patient needs a walker to treat with the following condition  Answer:  Status post revision of total replacement of left knee   01/26/18 1713          Diagnostic Studies: Dg Knee Left Port  Result Date: 01/26/2018 CLINICAL DATA:  Revision left knee arthroplasty EXAM: PORTABLE LEFT KNEE - 1-2 VIEW COMPARISON:  None. FINDINGS: Midline vertical skin staples overlie the left knee anteriorly. Left total knee arthroplasty with well-positioned left distal femoral and left proximal tibial prostheses. No acute osseous fracture. No dislocation. No suspicious focal osseous lesions. Suprapatellar joint effusion with expected gas within and surrounding the left knee joint. IMPRESSION: 1. Well-positioned prostheses status post left total knee arthroplasty revision. 2. Suprapatellar left knee joint effusion. Expected gas within and surrounding the left knee joint. Electronically Signed   By: Ilona Sorrel M.D.    On: 01/26/2018 20:39    Disposition: Discharge disposition: 01-Home or [place] Follow up.   Specialty:  [place] Why:  A representative from [facility] will contact you to arrange start date ands time for your therapy. Contact information: [place] [address] [phone]        Mcarthur Rossetti, MD Follow up in 2 week(s).   Specialty:  Orthopedic  Surgery Contact information: [place] [address] [phone]            Signed: Mcarthur Rossetti 01/29/2018, 6:48 AM

## 2018-01-29 NOTE — Plan of Care (Signed)
  Problem: Activity: Goal: Ability to avoid complications of mobility impairment will improve Outcome: Adequate for Discharge Goal: Range of joint motion will improve Outcome: Adequate for Discharge   Problem: Pain Management: Goal: Pain level will decrease with appropriate interventions Outcome: Progressing

## 2018-01-29 NOTE — Progress Notes (Signed)
Pt given prescriptions and discharge instructions. Instructions gone over with her. Equipment and belongings gathered to be sent home with her. Pt in no distress at time of discharge.

## 2018-01-29 NOTE — Progress Notes (Signed)
Patient ID: Yvonne Miller, female   DOB: 1950-05-12, 67 y.o.   MRN: [id number] No acute changes.  Vitals stable.  Left knee stable.  Can be discharged to home today.

## 2018-01-29 NOTE — Progress Notes (Signed)
Physical Therapy Treatment Patient Details Name: Yvonne Miller MRN: [id number] DOB: 06/21/1950 Today's Date: 01/29/2018    History of Present Illness Pt is a 67 y/o female s/p elective L TKA. PMH including but not limited to CKD, DM, HTN, AICD placement, carpal tunnel bilaterally.    PT Comments    Pt making steady progress with functional mobility. Focus of session was on further gait training. She remains very slow and limited secondary to pain and fatigue. However, was able to tolerate ambulating ~100' with RW and min guard with multiple standing rest breaks. Pt would continue to benefit from skilled physical therapy services at this time while admitted and after d/c to address the below listed limitations in order to improve overall safety and independence with functional mobility.    Follow Up Recommendations  Home health PT;Supervision/Assistance - 24 hour     Equipment Recommendations  Rolling walker with 5" wheels;3in1 (PT)    Recommendations for Other Services       Precautions / Restrictions Precautions Precautions: Fall;Knee Precaution Comments: reviewed positioning of knee following TKA surgery with pt throughout Restrictions Weight Bearing Restrictions: Yes LLE Weight Bearing: Weight bearing as tolerated    Mobility  Bed Mobility Overal bed mobility: Needs Assistance Bed Mobility: Supine to Sit     Supine to sit: Min assist     General bed mobility comments: increased time and effort, assist with L LE movement off of bed and for hip positioning at EOB  Transfers Overall transfer level: Needs assistance Equipment used: Rolling walker (2 wheeled) Transfers: Sit to/from Stand Sit to Stand: Min guard         General transfer comment: increased time and effort, use of momentum, cueing for safe hand placement, min guard for safety  Ambulation/Gait Ambulation/Gait assistance: Min guard Gait Distance (Feet): 100 Feet Assistive device: Rolling walker (2  wheeled) Gait Pattern/deviations: Step-to pattern;Step-through pattern;Decreased step length - right;Decreased step length - left;Decreased stride length;Decreased weight shift to left;Antalgic Gait velocity: decreased Gait velocity interpretation: <1.31 ft/sec, indicative of household ambulator General Gait Details: continues to require frequent standing rest breaks throughout secondary to pain and fatigue. She continues to demonstrate a very slow but steady gait pattern with RW, min guard for safety   Stairs             Wheelchair Mobility    Modified Rankin (Stroke Patients Only)       Balance Overall balance assessment: Needs assistance Sitting-balance support: Feet supported Sitting balance-Leahy Scale: Good     Standing balance support: During functional activity;Bilateral upper extremity supported Standing balance-Leahy Scale: Poor                              Cognition Arousal/Alertness: Awake/alert Behavior During Therapy: WFL for tasks assessed/performed Overall Cognitive Status: Within Functional Limits for tasks assessed                                        Exercises      General Comments        Pertinent Vitals/Pain Pain Assessment: 0-10 Pain Score: 5  Pain Location: L knee Pain Descriptors / Indicators: Sore;Guarding Pain Intervention(s): Monitored during session;Repositioned;RN gave pain meds during session    Home Living  Prior Function            PT Goals (current goals can now be found in the care plan section) Acute Rehab PT Goals PT Goal Formulation: With patient Time For Goal Achievement: 02/10/18 Potential to Achieve Goals: Good Progress towards PT goals: Progressing toward goals    Frequency    7X/week      PT Plan Current plan remains appropriate    Co-evaluation              AM-PAC PT "6 Clicks" Daily Activity  Outcome Measure  Difficulty turning  over in bed (including adjusting bedclothes, sheets and blankets)?: Unable Difficulty moving from lying on back to sitting on the side of the bed? : Unable Difficulty sitting down on and standing up from a chair with arms (e.g., wheelchair, bedside commode, etc,.)?: Unable Help needed moving to and from a bed to chair (including a wheelchair)?: A Little Help needed walking in hospital room?: A Little Help needed climbing 3-5 steps with a railing? : A Little 6 Click Score: 12    End of Session Equipment Utilized During Treatment: Gait belt Activity Tolerance: Patient limited by pain;Patient limited by fatigue Patient left: in chair;with call bell/phone within reach;with family/visitor present Nurse Communication: Mobility status PT Visit Diagnosis: Other abnormalities of gait and mobility (R26.89);Pain Pain - Right/Left: Left Pain - part of body: Knee     Time: 0165-5374 PT Time Calculation (min) (ACUTE ONLY): 44 min  Charges:  $Gait Training: 23-37 mins $Therapeutic Activity: 8-22 mins                     Sherie Don, PT, DPT  Acute Rehabilitation Services Pager [phone] Office [place] 01/29/2018, 10:11 AM

## 2018-01-29 NOTE — Care Management Important Message (Signed)
Important Message  Patient Details  Name: Yvonne Miller MRN: [id number] Date of Birth: [birth year]   Medicare Important Message Given:  Yes    Vasco Chong [address] 01/29/2018, 3:35 PM

## 2018-02-01 ENCOUNTER — Telehealth (INDEPENDENT_AMBULATORY_CARE_PROVIDER_SITE_OTHER): Payer: Self-pay | Admitting: Orthopaedic Surgery

## 2018-02-01 NOTE — Telephone Encounter (Signed)
IC s/w Sonia Side, verbal given and advised about bandage.

## 2018-02-01 NOTE — Telephone Encounter (Signed)
[place] for orders, but do keep dressing on until her outpatient follow-up.

## 2018-02-01 NOTE — Telephone Encounter (Signed)
[place]  [phone]   Verbal orders  3 times a week for 4 weeks    Sherly called from [employer] would like to know if patient dressing should stay on until post op visit.

## 2018-02-01 NOTE — Telephone Encounter (Signed)
[place] for orders? Keep bandage intact until post op visit?

## 2018-02-09 ENCOUNTER — Other Ambulatory Visit (INDEPENDENT_AMBULATORY_CARE_PROVIDER_SITE_OTHER): Payer: Self-pay

## 2018-02-09 ENCOUNTER — Ambulatory Visit (INDEPENDENT_AMBULATORY_CARE_PROVIDER_SITE_OTHER): Payer: Medicare Other | Admitting: Orthopaedic Surgery

## 2018-02-09 ENCOUNTER — Encounter (INDEPENDENT_AMBULATORY_CARE_PROVIDER_SITE_OTHER): Payer: Self-pay | Admitting: Orthopaedic Surgery

## 2018-02-09 DIAGNOSIS — Z96652 Presence of left artificial knee joint: Secondary | ICD-10-CM

## 2018-02-09 MED ORDER — OXYCODONE HCL 5 MG PO TABS: 5.0000 mg | ORAL_TABLET | ORAL | 0 refills | Status: DC | PRN

## 2018-02-09 NOTE — Progress Notes (Signed)
The patient is now 2 weeks status post a left total knee arthroplasty.  She is to going to home health therapy and states that they have flex to 90 degrees.  She feels like she is doing better overall.  She is now stopped the blood thinning medication which was Xarelto.  On exam her incision looks good so I remove the staples and placed Steri-Strips.  She has almost full extension to 90 degrees flexion.  Her calf is soft.  The knee feels it mostly stable.  She will continue to push herself through therapy and we can transition her to outpatient therapy at [facility].  They can work on range of motion of her knee as well as strengthening and balance and coordination.  I will refill oxycodone is a difficult appropriate for her to be on this.  She will take Aleve or ibuprofen in between dosages.  She is not taking a lot of this but I think it is helpful with her therapy.  We will see her back in 4 weeks to see how she is doing overall but no x-rays are needed.

## 2018-02-19 ENCOUNTER — Other Ambulatory Visit: Payer: Self-pay | Admitting: Internal Medicine

## 2018-02-26 ENCOUNTER — Encounter: Payer: Self-pay | Admitting: Internal Medicine

## 2018-03-03 ENCOUNTER — Other Ambulatory Visit: Payer: Self-pay

## 2018-03-03 ENCOUNTER — Encounter: Payer: Self-pay | Admitting: Physical Therapy

## 2018-03-03 ENCOUNTER — Ambulatory Visit: Payer: Medicare Other | Attending: Orthopaedic Surgery | Admitting: Physical Therapy

## 2018-03-03 ENCOUNTER — Other Ambulatory Visit: Payer: Self-pay | Admitting: Neurosurgery

## 2018-03-03 DIAGNOSIS — M25662 Stiffness of left knee, not elsewhere classified: Secondary | ICD-10-CM | POA: Insufficient documentation

## 2018-03-03 DIAGNOSIS — M6281 Muscle weakness (generalized): Secondary | ICD-10-CM | POA: Insufficient documentation

## 2018-03-03 DIAGNOSIS — R262 Difficulty in walking, not elsewhere classified: Secondary | ICD-10-CM | POA: Diagnosis present

## 2018-03-03 DIAGNOSIS — M25562 Pain in left knee: Secondary | ICD-10-CM | POA: Insufficient documentation

## 2018-03-03 NOTE — Therapy (Addendum)
[facility] [address]  [place], [place], [address] Phone: [phone]   Fax:  [phone]  Physical Therapy Evaluation  Patient Details  Name: Yvonne Miller MRN: [id number] Date of Birth: [birth year] Referring Provider (PT): Jean Rosenthal, MD   Progress Note Reporting Period 03/03/18 to 03/03/18  See note below for Objective Data and Assessment of Progress/Goals.    Encounter Date: 03/03/2018  PT End of Session - 03/03/18 5916    Visit Number  1    Number of Visits  7    Date for PT Re-Evaluation  04/14/18    Authorization Type  Medicare    PT Start Time  [phone]   patient late   PT Stop Time  0923    PT Time Calculation (min)  32 min    Activity Tolerance  Patient tolerated treatment well;Patient limited by pain    Behavior During Therapy  [facility] for tasks assessed/performed       Past Medical History:  Diagnosis Date  . AICD (automatic cardioverter/defibrillator) present   . Arthritis   . Asthma   . Carpal tunnel syndrome, bilateral   . CKD (chronic kidney disease)   . Diabetes mellitus   . GERD (gastroesophageal reflux disease)   . Hyperlipemia   . Hypertension   . Obesity    morbid  . Sleep apnea    wears CPAP set at 12  . Wears glasses     Past Surgical History:  Procedure Laterality Date  . ABDOMINAL HYSTERECTOMY    . BACK SURGERY    . BREAST SURGERY    . CARPAL TUNNEL RELEASE Left 07/10/2017   Procedure: CARPAL TUNNEL RELEASE;  Surgeon: Ashok Pall, MD;  Location: [facility];  Service: Neurosurgery;  Laterality: Left;  left  . CHOLECYSTECTOMY    . DILATION AND CURETTAGE OF UTERUS    . ICD IMPLANT    . JOINT REPLACEMENT    . TONSILLECTOMY    . TOTAL KNEE ARTHROPLASTY Left 01/26/2018   Procedure: LEFT TOTAL KNEE ARTHROPLASTY;  Surgeon: Mcarthur Rossetti, MD;  Location: [facility];  Service: Orthopedics;  Laterality: Left;    There were no vitals filed for this  visit.   Subjective Assessment - 03/03/18 0856    Subjective  Patient reports she underwent L TKA on 01/26/18. Had [facility] for 4 weeks. Currently walking without AD. Used a walker for a week, then cane for 2 weeks. Aware of surgical precautions. Reports she is not really having trouble with anything but not able to bend knee all the way. Not having pain. Did water aerobics before surgery and would like to get back to that.    Pertinent History  obesity, HTN, HLD, GERD, DM, CKD, B carpal tunnel release, asthma, automatic cardioverter/defibrillator    How long can you sit comfortably?  unlimited    How long can you stand comfortably?  30 min d/t fatigue     How long can you walk comfortably?  15 min    Patient Stated Goals  "i don't know"    Currently in Pain?  Yes    Pain Score  0-No pain    Pain Location  Knee    Pain Orientation  Left;Medial    Pain Descriptors / Indicators  Burning;Tender    Pain Type  Acute pain;Surgical pain    Aggravating Factors   sleeping or turning wrong    Pain Relieving Factors  ice  [facility] PT Assessment - 03/03/18 0901      Assessment   Medical Diagnosis  S/P L TKA    Referring Provider (PT)  Jean Rosenthal, MD    Onset Date/Surgical Date  01/26/18    Next MD Visit  03/09/18    Prior Therapy  Yes- HHPT for knee      Precautions   Precautions  ICD/Pacemaker;Knee   asthma     Restrictions   Weight Bearing Restrictions  --   WBAT     Balance Screen   Has the patient fallen in the past 6 months  No    Has the patient had a decrease in activity level because of a fear of falling?   No    Is the patient reluctant to leave their home because of a fear of falling?   No      Home Environment   Living Environment  Private residence    Living Arrangements  Spouse/significant other    Available Help at Discharge  Family    Type of [place] to enter    Entrance Stairs-Number of Steps  1    Entrance Stairs-Rails   None    Home Layout  Two level    Alternate Level Stairs-Number of Steps  16    Alternate Level Stairs-Rails  Right    [place] - single point;Walker - 2 wheels      Prior Function   Level of Independence  Independent    Vocation  Retired    Dietitian   Overall Cognitive Status  Within Functional Limits for tasks assessed      Observation/Other Assessments   Focus on Therapeutic Outcomes (FOTO)   Next session      Sensation   Light Touch  Appears Intact   carpal tunnel on R hand     Coordination   Gross Motor Movements are Fluid and Coordinated  Yes      Posture/Postural Control   Posture/Postural Control  Postural limitations    Postural Limitations  Rounded Shoulders;Forward head;Posterior pelvic tilt      ROM / Strength   AROM / PROM / Strength  AROM;PROM;Strength      AROM   AROM Assessment Site  Knee    Right/Left Knee  Right;Left    Right Knee Extension  0    Right Knee Flexion  106    Left Knee Extension  5    Left Knee Flexion  110      PROM   PROM Assessment Site  Knee    Right/Left Knee  Right;Left    Right Knee Extension  0    Right Knee Flexion  115    Left Knee Extension  3    Left Knee Flexion  113   severe pain     Strength   Strength Assessment Site  Hip;Knee;Ankle    Right/Left Hip  Right;Left    Right Hip Flexion  4+/5    Right Hip ABduction  4+/5    Right Hip ADduction  4+/5    Left Hip Flexion  4/5    Left Hip ABduction  4+/5    Left Hip ADduction  4+/5    Right/Left Knee  Right;Left    Right Knee Flexion  4+/5    Right Knee Extension  4+/5    Left Knee Flexion  4/5    Left  Knee Extension  4/5    Right/Left Ankle  Right;Left    Right Ankle Dorsiflexion  4+/5    Right Ankle Plantar Flexion  4+/5    Left Ankle Dorsiflexion  4/5    Left Ankle Plantar Flexion  4+/5      Palpation   Patella mobility  L patella hypomobile in inf/sup directions    Palpation comment  no tenderness       Ambulation/Gait   Assistive device  None    Gait Pattern  Step-through pattern;Decreased stance time - left;Decreased hip/knee flexion - left;Decreased weight shift to left;Decreased step length - right    Ambulation Surface  Level;Indoor    Gait velocity  decreased                Objective measurements completed on examination: See above findings.              PT Education - 03/03/18 0928    Education Details  prognosis, POC, HEP    Person(s) Educated  Patient    Methods  Explanation;Demonstration;Tactile cues;Verbal cues;Handout    Comprehension  Verbalized understanding;Returned demonstration       PT Short Term Goals - 03/03/18 0933      PT SHORT TERM GOAL #1   Title  Patient to be independent with initial HEP.    Time  3    Period  Weeks    Status  New    Target Date  03/24/18        PT Long Term Goals - 03/03/18 0933      PT LONG TERM GOAL #1   Title  Patient to be indepednent with advanced HEP.    Time  6    Period  Weeks    Status  New    Target Date  04/14/18      PT LONG TERM GOAL #2   Title  Patient to demonstrate [facility] and pain free L knee AROM/PROM.     Time  6    Period  Weeks    Status  New    Target Date  04/14/18      PT LONG TERM GOAL #3   Title  Patient to demonstrate >=4+/5 strength in B LEs.    Time  6    Period  Weeks    Status  New    Target Date  04/14/18      PT LONG TERM GOAL #4   Title  Patient to demonstrate reciprocal stair climbing with 1 handrail and good eccentric control throughout.     Time  6    Period  Weeks    Status  New    Target Date  04/14/18      PT LONG TERM GOAL #5   Title  Patient to report return to water aerobics without limitations.     Time  6    Period  Weeks    Status  New    Target Date  04/14/18             Plan - 03/03/18 3888    Clinical Impression Statement  Patient is a 67y/o F presenting to OPPT after L TKA on 01/26/18. Underwent HHPT for 4 weeks and currently  ambulating without AD. Also reports low pain levels, but reports stiffness in L knee and would like to return to water aerobics. Patient today with limited and painful L knee ROM, decreased LE strength, L patellar hypomobility in inferior and superior directions, and  gait deviations. Educated on and received gentle ROM and strengthening HEP handout. Patient reported understanding. Would benefit from skilled PT services 1x/week for 6 weeks to address aforementioned impairments.     Clinical Presentation  Stable    Clinical Decision Making  Low    Rehab Potential  Good    Clinical Impairments Affecting Rehab Potential  obesity, HTN, HLD, GERD, DM, CKD, B carpal tunnel release, asthma, automatic cardioverter/defibrillator    PT Frequency  1x / week    PT Duration  6 weeks    PT Treatment/Interventions  ADLs/Self Care Home Management;Cryotherapy;Moist Heat;DME Instruction;Gait training;Stair training;Functional mobility training;Therapeutic activities;Therapeutic exercise;Manual techniques;Orthotic Fit/Training;Patient/family education;Neuromuscular re-education;Balance training;Scar mobilization;Passive range of motion;Dry needling;Energy conservation;Splinting;Taping;Vasopneumatic Device    PT Next Visit Plan  reassess HEP; FOTO    Consulted and Agree with Plan of Care  Patient       Patient will benefit from skilled therapeutic intervention in order to improve the following deficits and impairments:  Decreased endurance, Hypomobility, Decreased scar mobility, Decreased activity tolerance, Decreased strength, Pain, Difficulty walking, Decreased mobility, Decreased range of motion, Improper body mechanics, Postural dysfunction, Impaired flexibility  Visit Diagnosis: Stiffness of left knee, not elsewhere classified  Acute pain of left knee  Muscle weakness (generalized)  Difficulty in walking, not elsewhere classified     Problem List Patient Active Problem List   Diagnosis Date Noted  .  Status post revision of total replacement of left knee 01/26/2018  . Unilateral primary osteoarthritis, left knee 08/18/2017     Janene Harvey, PT, DPT 03/03/18 11:34 AM   [facility] [address]  [place], [place], [address] Phone: [phone]   Fax:  [phone]  Name: Yvonne Miller MRN: [id number] Date of Birth: [birth year]   PHYSICAL THERAPY DISCHARGE SUMMARY  Visits from Start of Care: 1  Current functional level related to goals / functional outcomes: See above clinical impression   Remaining deficits: See above   Education / Equipment: HEP  Plan: Patient agrees to discharge.  Patient goals were not met. Patient is being discharged due to the physician's request.  ?????     Janene Harvey, PT, DPT 03/11/18 8:19 AM

## 2018-03-04 ENCOUNTER — Encounter: Payer: Self-pay | Admitting: Internal Medicine

## 2018-03-04 ENCOUNTER — Ambulatory Visit: Payer: Medicare Other | Admitting: Internal Medicine

## 2018-03-04 VITALS — BP 114/68 | HR 74 | Temp 98.0°F | Ht 64.0 in | Wt 255.4 lb

## 2018-03-04 DIAGNOSIS — Z23 Encounter for immunization: Secondary | ICD-10-CM

## 2018-03-04 DIAGNOSIS — I5042 Chronic combined systolic (congestive) and diastolic (congestive) heart failure: Secondary | ICD-10-CM | POA: Insufficient documentation

## 2018-03-04 DIAGNOSIS — E1122 Type 2 diabetes mellitus with diabetic chronic kidney disease: Secondary | ICD-10-CM

## 2018-03-04 DIAGNOSIS — I5032 Chronic diastolic (congestive) heart failure: Secondary | ICD-10-CM | POA: Insufficient documentation

## 2018-03-04 DIAGNOSIS — I130 Hypertensive heart and chronic kidney disease with heart failure and stage 1 through stage 4 chronic kidney disease, or unspecified chronic kidney disease: Secondary | ICD-10-CM

## 2018-03-04 DIAGNOSIS — I1310 Hypertensive heart and chronic kidney disease without heart failure, with stage 1 through stage 4 chronic kidney disease, or unspecified chronic kidney disease: Secondary | ICD-10-CM | POA: Insufficient documentation

## 2018-03-04 DIAGNOSIS — L304 Erythema intertrigo: Secondary | ICD-10-CM

## 2018-03-04 DIAGNOSIS — N183 Chronic kidney disease, stage 3 unspecified: Secondary | ICD-10-CM

## 2018-03-04 DIAGNOSIS — N182 Chronic kidney disease, stage 2 (mild): Secondary | ICD-10-CM | POA: Insufficient documentation

## 2018-03-04 DIAGNOSIS — Z794 Long term (current) use of insulin: Secondary | ICD-10-CM | POA: Insufficient documentation

## 2018-03-04 LAB — CMP14+EGFR
ALT: 9 IU/L (ref 0–32)
AST: 12 IU/L (ref 0–40)
Albumin/Globulin Ratio: 1.4 (ref 1.2–2.2)
Albumin: 4.1 g/dL (ref 3.6–4.8)
Alkaline Phosphatase: 90 IU/L (ref 39–117)
BUN/Creatinine Ratio: 18 (ref 12–28)
BUN: 26 mg/dL (ref 8–27)
Bilirubin Total: <0.2 mg/dL (ref 0.0–1.2)
CO2: 22 mmol/L (ref 20–29)
Calcium: 9.7 mg/dL (ref 8.7–10.3)
Chloride: 101 mmol/L (ref 96–106)
Creatinine, Ser: 1.43 mg/dL — ABNORMAL HIGH (ref 0.57–1.00)
GFR calc Af Amer: 43 mL/min/{1.73_m2} — ABNORMAL LOW (ref >59)
GFR calc non Af Amer: 38 mL/min/{1.73_m2} — ABNORMAL LOW (ref >59)
Globulin, Total: 2.9 g/dL (ref 1.5–4.5)
Glucose: 196 mg/dL — ABNORMAL HIGH (ref 65–99)
Potassium: 4.9 mmol/L (ref 3.5–5.2)
Sodium: 136 mmol/L (ref 134–144)
Total Protein: 7 g/dL (ref 6.0–8.5)

## 2018-03-04 LAB — LIPID PANEL
Chol/HDL Ratio: 3.6 ratio (ref 0.0–4.4)
Cholesterol, Total: 137 mg/dL (ref 100–199)
HDL: 38 mg/dL — ABNORMAL LOW (ref >39)
LDL Calculated: 67 mg/dL (ref 0–99)
Triglycerides: 161 mg/dL — ABNORMAL HIGH (ref 0–149)
VLDL Cholesterol Cal: 32 mg/dL (ref 5–40)

## 2018-03-04 LAB — HEMOGLOBIN A1C
Est. average glucose Bld gHb Est-mCnc: 131 mg/dL
Hgb A1c MFr Bld: 6.2 % — ABNORMAL HIGH (ref 4.8–5.6)

## 2018-03-04 MED ORDER — NYSTATIN 100000 UNIT/GM EX POWD: Freq: Three times a day (TID) | CUTANEOUS | 2 refills | Status: DC | PRN

## 2018-03-04 NOTE — Progress Notes (Addendum)
Subjective:     Patient ID: Yvonne Miller , female    DOB: 1951/03/08 , 67 y.o.   MRN: [id number]   Chief Complaint  Patient presents with  . Hypertension  . Diabetes    HPI  Hypertension  This is a chronic problem. The current episode started more than 1 year ago. The problem is unchanged. The problem is controlled. Pertinent negatives include no chest pain. Risk factors for coronary artery disease include diabetes mellitus, dyslipidemia, obesity, post-menopausal state and sedentary lifestyle. The current treatment provides moderate improvement. Compliance problems include exercise.  Hypertensive end-organ damage includes kidney disease.  Diabetes  She presents for her follow-up diabetic visit. She has type 2 diabetes mellitus. Her disease course has been stable. Pertinent negatives for diabetes include no chest pain, no fatigue, no polydipsia and no polyphagia.     Past Medical History:  Diagnosis Date  . AICD (automatic cardioverter/defibrillator) present   . Arthritis   . Asthma   . Carpal tunnel syndrome, bilateral   . CKD (chronic kidney disease)   . Diabetes mellitus   . GERD (gastroesophageal reflux disease)   . Hyperlipemia   . Hypertension   . Obesity    morbid  . Sleep apnea    wears CPAP set at 11  . Wears glasses      Family History  Problem Relation Age of Onset  . Hypertension Mother   . Stroke Mother   . Heart disease Father   . Heart attack Sister   . Heart attack Brother      Current Outpatient Medications:  .  albuterol (PROVENTIL HFA;VENTOLIN HFA) 108 (90 Base) MCG/ACT inhaler, Inhale 1 puff into the lungs every 4 (four) hours as needed for wheezing or shortness of breath., Disp: , Rfl:  .  amLODipine (NORVASC) 5 MG tablet, Take 5 mg by mouth daily., Disp: , Rfl:  .  B Complex Vitamins (B COMPLEX-B12) TABS, Take 1 tablet by mouth daily., Disp: , Rfl:  .  carvedilol (COREG) 25 MG tablet, Take 37.5 mg by mouth 2 (two) times daily with a meal. ,  Disp: , Rfl:  .  cholecalciferol (VITAMIN D) 1000 units tablet, Take 1,000 Units by mouth daily., Disp: , Rfl:  .  Cinnamon 500 MG capsule, Take 500 mg by mouth daily., Disp: , Rfl:  .  hydrALAZINE (APRESOLINE) 50 MG tablet, Take 50 mg by mouth 2 (two) times daily., Disp: , Rfl:  .  insulin NPH-regular Human (NOVOLIN 70/30) (70-30) 100 UNIT/ML injection, Inject 20-60 Units into the skin See admin instructions. 20 units in the morning, and 60 units at bedtime, Disp: , Rfl:  .  metFORMIN (GLUCOPHAGE-XR) 500 MG 24 hr tablet, Take 500 mg by mouth daily with breakfast. , Disp: , Rfl:  .  nystatin (NYSTATIN) powder, Apply topically 3 (three) times daily as needed. As needed, Disp: 60 g, Rfl: 2 .  Omega-3 Fatty Acids (FISH OIL) 1000 MG CAPS, Take 1 capsule by mouth daily. , Disp: , Rfl:  .  pravastatin (PRAVACHOL) 40 MG tablet, TAKE 1 TABLET BY MOUTH  EVERY DAY, Disp: 90 tablet, Rfl: 0 .  sitaGLIPtin (JANUVIA) 100 MG tablet, Take 100 mg by mouth daily., Disp: , Rfl:  .  spironolactone (ALDACTONE) 50 MG tablet, Take 50 mg by mouth every morning., Disp: , Rfl:  .  valsartan-hydrochlorothiazide (DIOVAN-HCT) 160-25 MG tablet, TAKE 1 TABLET BY MOUTH  EVERY DAY, Disp: 90 tablet, Rfl: 0   Allergies  Allergen Reactions  .  Other Other (See Comments)    NO BLOOD   . Jehovah witness     Review of Systems  Constitutional: Negative.  Negative for fatigue.  HENT: Negative.   Respiratory: Negative.   Cardiovascular: Negative.  Negative for chest pain.  Gastrointestinal: Negative.   Endocrine: Negative for polydipsia and polyphagia.  Skin: Positive for rash (she c/o rash underneath breasts. It is itchy. ).  Psychiatric/Behavioral: Negative.      Today's Vitals   03/04/18 0843  BP: 114/68  Pulse: 74  Temp: 98 F (36.7 C)  TempSrc: Oral  Weight: 255 lb 6.4 oz (115.8 kg)  Height: '5\' 4"'$  (1.626 m)  PainSc: 0-No pain   Body mass index is 43.84 kg/m.   Objective:  Physical Exam  Constitutional: She  is oriented to person, place, and time. She appears well-developed and well-nourished.  HENT:  Head: Normocephalic and atraumatic.  Eyes: EOM are normal.  Cardiovascular: Normal rate, regular rhythm and normal heart sounds.  Pulmonary/Chest: Effort normal and breath sounds normal.  Neurological: She is alert and oriented to person, place, and time.  Skin: Skin is warm and dry.  Erythematous rash underneath breasts. No vesicular lesions noted.   Nursing note and vitals reviewed.       Assessment And Plan:     1. Hypertensive heart and renal disease with renal failure, stage 1 through stage 4 or unspecified chronic kidney disease, with heart failure ([place])  Well controlled. She will continue with current meds. She is encouraged to avoid adding salt to her foods.   2. Chronic combined systolic and diastolic congestive heart failure (HCC)  Chronic, yet stable. Importance of dietary compliance was discussed with the patient. I will forward a copy of today's labs to her cardiologist for his review.   3. Type 2 diabetes mellitus with stage 3 chronic kidney disease, with long-term current use of insulin (HCC)  We discussed the use of Trulicity to help with bs control. She has tolerated GLP-1 products in the past. She denies family history of thyroid cancer. She was instructed on how to self-administer the medication. She will start with 0.'75mg'$  once weekly x 2 weeks, then increase to 1.'5mg'$  once weekly. She will rto in six weeks for re-evaluation. Given history of heart failure, I will consider use of SGLT-2 inhibitors in the near future.   - Hemoglobin A1c - Lipid Profile - CMP14+EGFR  4. Chronic renal disease, stage III  Chronic. I will check a gfr, cr today. Importance of optimal BP/BS control was discussed with the patient.   5. Erythema intertrigo  She was given a refill of Nystop powder to use as needed.   6. Need for vaccination  - Flu vaccine HIGH DOSE PF (Fluzone High dose)       Maximino Greenland, MD

## 2018-03-05 NOTE — Progress Notes (Signed)
Your hba1c is 6.2, this is great. I am confident that we should be able to decrease your insulin dose with use of Trulicity.  How do you feel thus far?  Your triglycerides are elevated.  Pls avoid processed foods. Your LDL, bad chol is great. Your liver and kidney fxn are stable. I am so proud of you! Keep up the great work! Kw-pls send labs to Dr. Einar Gip.

## 2018-03-09 ENCOUNTER — Encounter (INDEPENDENT_AMBULATORY_CARE_PROVIDER_SITE_OTHER): Payer: Self-pay | Admitting: Orthopaedic Surgery

## 2018-03-09 ENCOUNTER — Ambulatory Visit (INDEPENDENT_AMBULATORY_CARE_PROVIDER_SITE_OTHER): Payer: Medicare Other | Admitting: Orthopaedic Surgery

## 2018-03-09 DIAGNOSIS — Z96652 Presence of left artificial knee joint: Secondary | ICD-10-CM

## 2018-03-09 NOTE — Progress Notes (Signed)
The patient is now 6 weeks status post a left total knee arthroplasty.  She is 67 years old.  We replaced her right knee years ago.  She has made excellent progress with a home exercise program and outpatient physical therapy.  She feels that she can do this on her own now since she is highly motivated and going to the gym.  She does have a BMI of over 40.  On exam her knee has full extension on her left operative knee.  Her flexion is to about 100 degrees.  I think some of her limitations is related to her weight but overall she looks great.  There is no swelling at all.  Her incisions healed nicely.  The knee feels ligamentously stable.  At this point we will see her back in 6 months.  She can stop outpatient physical therapy as long as she is going to her gym and working on weight loss and strengthening her knee.  We will see her back in 6 months and have a AP and lateral of the left knee.

## 2018-03-11 ENCOUNTER — Ambulatory Visit: Payer: Medicare Other

## 2018-03-11 NOTE — Pre-Procedure Instructions (Signed)
Yvonne Miller  03/11/2018      [id number] - [place], [place] - [address]OSS [address] [place] [address] Phone: [phone] Fax: [place], [place] [address] [place] [address] Phone: [phone] Fax: [phone]    Your procedure is scheduled on Friday November 15.  Report to [facility] Admitting at 8:40 A.M.  Call this number if you have problems the morning of surgery:  [phone]   Remember:  Do not eat or drink after midnight.    Take these medicines the morning of surgery with A SIP OF WATER:   Carvedilol (coreg) Hydralazine (Apresoline) Amlodipine (norvasc) Albuterol if needed (please bring to hospital with you)  DO NOT take Metformin (Glucophage) the day of surgery  Take 70% of your Novolin 70/30 the day before surgery (14 units in the morning, and 42 units at night)  DO not take your Novolin 70/30 the morning of surgery     How to Manage Your Diabetes Before and After Surgery  Why is it important to control my blood sugar before and after surgery? . Improving blood sugar levels before and after surgery helps healing and can limit problems. . A way of improving blood sugar control is eating a healthy diet by: o  Eating less sugar and carbohydrates o  Increasing activity/exercise o  Talking with your doctor about reaching your blood sugar goals . High blood sugars (greater than 180 mg/dL) can raise your risk of infections and slow your recovery, so you will need to focus on controlling your diabetes during the weeks before surgery. . Make sure that the doctor who takes care of your diabetes knows about your planned surgery including the date and location.  How do I manage my blood sugar before surgery? . Check your blood sugar at least 4 times a day, starting 2 days before surgery, to make sure that  the level is not too high or low. o Check your blood sugar the morning of your surgery when you wake up and every 2 hours until you get to the Short Stay unit. . If your blood sugar is less than 70 mg/dL, you will need to treat for low blood sugar: o Do not take insulin. o Treat a low blood sugar (less than 70 mg/dL) with  cup of clear juice (cranberry or apple), 4 glucose tablets, OR glucose gel. Recheck blood sugar in 15 minutes after treatment (to make sure it is greater than 70 mg/dL). If your blood sugar is not greater than 70 mg/dL on recheck, call [phone] o  for further instructions. . Report your blood sugar to the short stay nurse when you get to Short Stay.  . If you are admitted to the hospital after surgery: o Your blood sugar will be checked by the staff and you will probably be given insulin after surgery (instead of oral diabetes medicines) to make sure you have good blood sugar levels. o The goal for blood sugar control after surgery is 80-180 mg/dL.                Do not wear jewelry, make-up or nail polish.  Do not wear lotions, powders, or perfumes, or deodorant.  Do not shave 48 hours prior to surgery.  Men may shave face and neck.  Do not bring valuables to the hospital.  [facility] is not responsible for any belongings or valuables.  Contacts, dentures or bridgework may not be worn into surgery.  Leave your suitcase in the car.  After surgery it may be brought to your room.  For patients admitted to the hospital, discharge time will be determined by your treatment team.  Patients discharged the day of surgery will not be allowed to drive home.    Special instructions:    [place]- Preparing For Surgery  Before surgery, you can play an important role. Because skin is not sterile, your skin needs to be as free of germs as possible. You can reduce the number of germs on your skin by washing with CHG (chlorahexidine gluconate) Soap before surgery.   CHG is an antiseptic cleaner which kills germs and bonds with the skin to continue killing germs even after washing.    Oral Hygiene is also important to reduce your risk of infection.  Remember - BRUSH YOUR TEETH THE MORNING OF SURGERY WITH YOUR REGULAR TOOTHPASTE  Please do not use if you have an allergy to CHG or antibacterial soaps. If your skin becomes reddened/irritated stop using the CHG.  Do not shave (including legs and underarms) for at least 48 hours prior to first CHG shower. It is OK to shave your face.  Please follow these instructions carefully.   1. Shower the NIGHT BEFORE SURGERY and the MORNING OF SURGERY with CHG.   2. If you chose to wash your hair, wash your hair first as usual with your normal shampoo.  3. After you shampoo, rinse your hair and body thoroughly to remove the shampoo.  4. Use CHG as you would any other liquid soap. You can apply CHG directly to the skin and wash gently with a scrungie or a clean washcloth.   5. Apply the CHG Soap to your body ONLY FROM THE NECK DOWN.  Do not use on open wounds or open sores. Avoid contact with your eyes, ears, mouth and genitals (private parts). Wash Face and genitals (private parts)  with your normal soap.  6. Wash thoroughly, paying special attention to the area where your surgery will be performed.  7. Thoroughly rinse your body with warm water from the neck down.  8. DO NOT shower/wash with your normal soap after using and rinsing off the CHG Soap.  9. Pat yourself dry with a CLEAN TOWEL.  10. Wear CLEAN PAJAMAS to bed the night before surgery, wear comfortable clothes the morning of surgery  11. Place CLEAN SHEETS on your bed the night of your first shower and DO NOT SLEEP WITH PETS.    Day of Surgery:  Do not apply any deodorants/lotions.  Please wear clean clothes to the hospital/surgery center.   Remember to brush your teeth WITH YOUR REGULAR TOOTHPASTE.    Please read over the following fact  sheets that you were given. Coughing and Deep Breathing, MRSA Information and Surgical Site Infection Prevention

## 2018-03-12 ENCOUNTER — Encounter (HOSPITAL_COMMUNITY): Payer: Self-pay

## 2018-03-12 ENCOUNTER — Encounter (HOSPITAL_COMMUNITY)
Admission: RE | Admit: 2018-03-12 | Discharge: 2018-03-12 | Disposition: A | Discharge status: Home / Self Care | Payer: Medicare Other | Source: Ambulatory Visit | Attending: Neurosurgery | Admitting: Neurosurgery

## 2018-03-12 ENCOUNTER — Other Ambulatory Visit: Payer: Self-pay

## 2018-03-12 DIAGNOSIS — Z6841 Body Mass Index (BMI) 40.0 and over, adult: Secondary | ICD-10-CM | POA: Insufficient documentation

## 2018-03-12 DIAGNOSIS — N183 Chronic kidney disease, stage 3 (moderate): Secondary | ICD-10-CM | POA: Diagnosis not present

## 2018-03-12 DIAGNOSIS — K219 Gastro-esophageal reflux disease without esophagitis: Secondary | ICD-10-CM | POA: Diagnosis not present

## 2018-03-12 DIAGNOSIS — Z794 Long term (current) use of insulin: Secondary | ICD-10-CM | POA: Diagnosis not present

## 2018-03-12 DIAGNOSIS — E785 Hyperlipidemia, unspecified: Secondary | ICD-10-CM | POA: Diagnosis not present

## 2018-03-12 DIAGNOSIS — E1122 Type 2 diabetes mellitus with diabetic chronic kidney disease: Secondary | ICD-10-CM | POA: Insufficient documentation

## 2018-03-12 DIAGNOSIS — Z7982 Long term (current) use of aspirin: Secondary | ICD-10-CM | POA: Insufficient documentation

## 2018-03-12 DIAGNOSIS — Z95810 Presence of automatic (implantable) cardiac defibrillator: Secondary | ICD-10-CM | POA: Insufficient documentation

## 2018-03-12 DIAGNOSIS — Z79899 Other long term (current) drug therapy: Secondary | ICD-10-CM | POA: Diagnosis not present

## 2018-03-12 DIAGNOSIS — G5600 Carpal tunnel syndrome, unspecified upper limb: Secondary | ICD-10-CM | POA: Insufficient documentation

## 2018-03-12 DIAGNOSIS — J45909 Unspecified asthma, uncomplicated: Secondary | ICD-10-CM | POA: Insufficient documentation

## 2018-03-12 DIAGNOSIS — I129 Hypertensive chronic kidney disease with stage 1 through stage 4 chronic kidney disease, or unspecified chronic kidney disease: Secondary | ICD-10-CM | POA: Diagnosis not present

## 2018-03-12 DIAGNOSIS — I447 Left bundle-branch block, unspecified: Secondary | ICD-10-CM | POA: Insufficient documentation

## 2018-03-12 DIAGNOSIS — Z01812 Encounter for preprocedural laboratory examination: Secondary | ICD-10-CM | POA: Insufficient documentation

## 2018-03-12 DIAGNOSIS — G4733 Obstructive sleep apnea (adult) (pediatric): Secondary | ICD-10-CM | POA: Diagnosis not present

## 2018-03-12 LAB — CBC
HCT: 35.8 % — ABNORMAL LOW (ref 36.0–46.0)
Hemoglobin: 10.6 g/dL — ABNORMAL LOW (ref 12.0–15.0)
MCH: 25.5 pg — ABNORMAL LOW (ref 26.0–34.0)
MCHC: 29.6 g/dL — ABNORMAL LOW (ref 30.0–36.0)
MCV: 86.3 fL (ref 80.0–100.0)
Platelets: 397 10*3/uL (ref 150–400)
RBC: 4.15 MIL/uL (ref 3.87–5.11)
RDW: 14.6 % (ref 11.5–15.5)
WBC: 11.3 10*3/uL — ABNORMAL HIGH (ref 4.0–10.5)
nRBC: 0 % (ref 0.0–0.2)

## 2018-03-12 LAB — BASIC METABOLIC PANEL
Anion gap: 8 (ref 5–15)
BUN: 25 mg/dL — ABNORMAL HIGH (ref 8–23)
CO2: 23 mmol/L (ref 22–32)
Calcium: 9.9 mg/dL (ref 8.9–10.3)
Chloride: 106 mmol/L (ref 98–111)
Creatinine, Ser: 1.58 mg/dL — ABNORMAL HIGH (ref 0.44–1.00)
GFR calc Af Amer: 38 mL/min — ABNORMAL LOW (ref >60)
GFR calc non Af Amer: 33 mL/min — ABNORMAL LOW (ref >60)
Glucose, Bld: 83 mg/dL (ref 70–99)
Potassium: 4.3 mmol/L (ref 3.5–5.1)
Sodium: 137 mmol/L (ref 135–145)

## 2018-03-12 LAB — GLUCOSE, CAPILLARY: Glucose-Capillary: 85 mg/dL (ref 70–99)

## 2018-03-12 LAB — NO BLOOD PRODUCTS

## 2018-03-12 NOTE — Progress Notes (Addendum)
PCP: [name], MD  Cardiologist:  Kela Millin, MD  EKG: 06/02/17 in EPIC  Stress test: pt denies in past 5 years  ECHO: 07/06/14 -see not from 07/09/17-Angela Jonah Blue, NP in EPIC  Cardiac Cath: 02/05/11 -see note from 07/09/17 in Epic  Chest x-ray: denies past year, no recent respiratory infections/complications  Pt has [employer] ICD managed at [facility] by Dr. Murtis Sink.  ICD orders faxed to [facility] per [place] at [employer] he believes a Magnet can be placed over device- if anesthesia needs him they can call the day of surgery

## 2018-03-12 NOTE — Progress Notes (Signed)
Called and spoke with [name], the rep. from [employer], he said he does not believe he will be needed day of surgery as they can place a magnet over the device but if he is needed by the physician's day of surgery he can be reached directly at [phone] and can arrive within an hour of the call.

## 2018-03-15 NOTE — Progress Notes (Signed)
Anesthesia Chart Review:  Case:  [id number] Date/Time:  03/19/18 1027   Procedure:  RIGHT CARPAL TUNNEL RELEASE (Right ) - RIGHT CARPAL TUNNEL RELEASE   Anesthesia type:  Monitor Anesthesia Care   Pre-op diagnosis:  Carpal tunnel syndrome   Location:  [facility] OR ROOM 19 / [place] OR   Surgeon:  Ashok Pall, MD      DISCUSSION: Patient is a 67 year old female scheduled for the above procedure.   History includes nonischemic cardiomyopathy '12, LBBB, BiVICD (Device Bos Sci Dynagen X4 CRT-D G158 S/N [id number], 10/27/13, [facility]), HTN, DM2, hyperlipidemia, CKD stage III (Cr 1.23-1.75 since 05/2017), OSA (CPAP), asthma, never smoker. Jehovah's Witness. BMI is consistent with morbid obesity. She is s/p left TKA 01/26/18.  EP cardiology (Dr. Dwana Curd, [facility]) recommended magnet over device during procedure and would not need post-operative interrogation. She is not pacer dependent. Last ICD interrogation 02/24/18.   She tolerated knee replacement just a few months ago. Renal function and anemia appear stable. If no acute changes then I anticipate that she can proceed as planned.   VS: BP 113/70   Pulse 73   Temp 36.7 C (Oral)   Resp 18   Ht 5\' 4"  (1.626 m)   Wt 114.8 kg   SpO2 100%   BMI 43.43 kg/m    PROVIDERS: -PCP is [name], MD. - Cardiologist is Kela Millin, MD. Last office visit 10/19/17 (scanned under Media tab, Correspondence, 01/26/18). Patient doing well by notes. No new cardiac tests ordered. One year follow-up recommended.   - EP cardiologist is Jones Bales, MD ([place]). Last office note with exam 05/28/17 by Eloisa Northern, NP. Last interrogation note is from 11/26/17 by Eloisa Northern, NP: "No issues today. You have an estimated 6.5 years left on your battery."   LABS: Labs reviewed: Acceptable for surgery. A1c on 03/04/18 was 6.2.  (all labs ordered are listed, but only abnormal results are displayed)  Labs Reviewed  BASIC METABOLIC PANEL - Abnormal; Notable for the  following components:      Result Value   BUN 25 (*)    Creatinine, Ser 1.58 (*)    GFR calc non Af Amer 33 (*)    GFR calc Af Amer 38 (*)    All other components within normal limits  CBC - Abnormal; Notable for the following components:   WBC 11.3 (*)    Hemoglobin 10.6 (*)    HCT 35.8 (*)    MCH 25.5 (*)    MCHC 29.6 (*)    All other components within normal limits  GLUCOSE, CAPILLARY  NO BLOOD PRODUCTS    IMAGES: CT C-spine 07/01/17: IMPRESSION: 1. Solid C4-C6 ACDF without significant residual stenosis. 2. 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 3. Small right paracentral disc protrusion at C6-7 without stenosis.   EKG: 10/19/17 [facility];scanned under Media tab, Correspondence, 01/26/18): NSR, bi-ventricularly paced rhythm.     CV: Echo 07/06/14 [facility], scanned under Media tab, Correspondence 07/10/17): 1. LV cavity minimally dilated. Mild concentric LVH. Normal global wall motion. Doppler evidence of grade IAI diastolic dysfunction. Normal systolic function. Calculated EF 55%. 2. LA cavity mild to moderately dilated. 3. Trace mitral regurgitation. 4. Mild tricuspid regurgitation. No evidence of pulmonary hypertension. 5. Compared to prior echo 09/12/13, there is remarkable improvement of LV systolic function. EF has improved from 28% to 55%  RHC/LHC 03/04/11: 1. 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 2. MarkedLV hypertrophy 3. Mild luminal irregularity of the coronary arteries without any significant stenosis. 4. Moderate pulmonary hypertension with elevated cardiac output and cardiac index due to morbid obesity. 5. Elevated LV end-diastolic pressure secondary to [facility] with morbid obesity.   Past Medical History:  Diagnosis Date  . AICD (automatic cardioverter/defibrillator)  present   . Arthritis   . Asthma   . Carpal tunnel syndrome, bilateral   . CKD (chronic kidney disease)   . Diabetes mellitus   . GERD (gastroesophageal reflux disease)   . Hyperlipemia   . Hypertension   . Obesity    morbid  . Sleep apnea    wears CPAP set at 12  . Wears glasses     Past Surgical History:  Procedure Laterality Date  . ABDOMINAL HYSTERECTOMY    . BREAST SURGERY  1968   breast mass excision  . CARPAL TUNNEL RELEASE Left 07/10/2017   Procedure: CARPAL TUNNEL RELEASE;  Surgeon: Ashok Pall, MD;  Location: [place];  Service: Neurosurgery;  Laterality: Left;  left  . CERVICAL SPINE SURGERY  2006  . CHOLECYSTECTOMY    . DILATION AND CURETTAGE OF UTERUS    . ICD IMPLANT    . JOINT REPLACEMENT    . TONSILLECTOMY  1961  . TOTAL KNEE ARTHROPLASTY Left 01/26/2018   Procedure: LEFT TOTAL KNEE ARTHROPLASTY;  Surgeon: Mcarthur Rossetti, MD;  Location: [place];  Service: Orthopedics;  Laterality: Left;    MEDICATIONS: . albuterol (PROVENTIL HFA;VENTOLIN HFA) 108 (90 Base) MCG/ACT inhaler  . amLODipine (NORVASC) 5 MG tablet  . aspirin EC 81 MG tablet  . B Complex Vitamins (B COMPLEX-B12) TABS  . carvedilol (COREG) 25 MG tablet  . cholecalciferol (VITAMIN D) 1000 units tablet  . Cinnamon 500 MG capsule  . hydrALAZINE (APRESOLINE) 50 MG tablet  . insulin NPH-regular Human (NOVOLIN 70/30) (70-30) 100 UNIT/ML injection  . metFORMIN (GLUCOPHAGE-XR) 500 MG 24 hr tablet  . nystatin (NYSTATIN) powder  . Omega-3 Fatty Acids (FISH OIL) 1000 MG CAPS  . pravastatin (PRAVACHOL) 40 MG tablet  . sitaGLIPtin (JANUVIA) 100 MG tablet  . spironolactone (ALDACTONE) 50 MG tablet  . valsartan-hydrochlorothiazide (DIOVAN-HCT) 160-25 MG tablet   No current facility-administered medications for this encounter.     [facility]/Anesthesiology Phone [phone] 03/15/2018 11:55 AM

## 2018-03-15 NOTE — Anesthesia Preprocedure Evaluation (Addendum)
Anesthesia Evaluation  Patient identified by MRN, date of birth, ID band Patient awake    Reviewed: Allergy & Precautions, NPO status , Patient's Chart, lab work & pertinent test results, reviewed documented beta blocker date and time   Airway Mallampati: II  TM Distance: >3 FB Neck ROM: Full    Dental no notable dental hx. (+) Teeth Intact, Missing   Pulmonary shortness of breath, asthma , sleep apnea and Continuous Positive Airway Pressure Ventilation ,    Pulmonary exam normal breath sounds clear to auscultation       Cardiovascular hypertension, Pt. on medications and Pt. on home beta blockers +CHF  Normal cardiovascular exam+ Cardiac Defibrillator  Rhythm:Regular Rate:Normal     Neuro/Psych Right Carpal Tunnel Syndrome  Neuromuscular disease negative psych ROS   GI/Hepatic Neg liver ROS, GERD  Controlled and Medicated,  Endo/Other  diabetes, Well Controlled, Type 2, Oral Hypoglycemic Agents, Insulin DependentMorbid obesityHyperlipidemia  Renal/GU Renal InsufficiencyRenal disease  negative genitourinary   Musculoskeletal  (+) Arthritis , Osteoarthritis,    Abdominal (+) + obese,   Peds  Hematology negative hematology ROS (+)   Anesthesia Other Findings   Reproductive/Obstetrics                            Anesthesia Physical Anesthesia Plan  ASA: III  Anesthesia Plan: MAC   Post-op Pain Management:    Induction: Intravenous  PONV Risk Score and Plan: 2 and Propofol infusion, Ondansetron, Treatment may vary due to age or medical condition and Midazolam  Airway Management Planned: Natural Airway, Nasal Cannula and Simple Face Mask  Additional Equipment:   Intra-op Plan:   Post-operative Plan:   Informed Consent: I have reviewed the patients History and Physical, chart, labs and discussed the procedure including the risks, benefits and alternatives for the proposed anesthesia  with the patient or authorized representative who has indicated his/her understanding and acceptance.   Dental advisory given  Plan Discussed with: CRNA and Surgeon  Anesthesia Plan Comments: (PAT note written 03/15/2018 by Myra Gianotti, PA-C. Magnet for procedure. )       Anesthesia Quick Evaluation

## 2018-03-18 ENCOUNTER — Encounter: Payer: Medicare Other | Admitting: Physical Therapy

## 2018-03-19 ENCOUNTER — Ambulatory Visit (HOSPITAL_COMMUNITY)
Admission: RE | Admit: 2018-03-19 | Discharge: 2018-03-19 | Disposition: A | Discharge status: Home / Self Care | Payer: Medicare Other | Source: Ambulatory Visit | Attending: Neurosurgery | Admitting: Neurosurgery

## 2018-03-19 ENCOUNTER — Ambulatory Visit (HOSPITAL_COMMUNITY): Payer: Medicare Other | Admitting: Anesthesiology

## 2018-03-19 ENCOUNTER — Encounter (HOSPITAL_COMMUNITY)
Admission: RE | Disposition: A | Discharge status: Home / Self Care | Payer: Self-pay | Source: Ambulatory Visit | Attending: Neurosurgery

## 2018-03-19 ENCOUNTER — Ambulatory Visit (HOSPITAL_COMMUNITY): Payer: Medicare Other | Admitting: Physician Assistant

## 2018-03-19 ENCOUNTER — Encounter (HOSPITAL_COMMUNITY): Payer: Self-pay

## 2018-03-19 DIAGNOSIS — J45909 Unspecified asthma, uncomplicated: Secondary | ICD-10-CM | POA: Diagnosis not present

## 2018-03-19 DIAGNOSIS — I509 Heart failure, unspecified: Secondary | ICD-10-CM | POA: Diagnosis not present

## 2018-03-19 DIAGNOSIS — E1122 Type 2 diabetes mellitus with diabetic chronic kidney disease: Secondary | ICD-10-CM | POA: Diagnosis not present

## 2018-03-19 DIAGNOSIS — Z79899 Other long term (current) drug therapy: Secondary | ICD-10-CM | POA: Diagnosis not present

## 2018-03-19 DIAGNOSIS — I130 Hypertensive heart and chronic kidney disease with heart failure and stage 1 through stage 4 chronic kidney disease, or unspecified chronic kidney disease: Secondary | ICD-10-CM | POA: Diagnosis not present

## 2018-03-19 DIAGNOSIS — I5042 Chronic combined systolic (congestive) and diastolic (congestive) heart failure: Secondary | ICD-10-CM | POA: Diagnosis not present

## 2018-03-19 DIAGNOSIS — N183 Chronic kidney disease, stage 3 (moderate): Secondary | ICD-10-CM | POA: Diagnosis not present

## 2018-03-19 DIAGNOSIS — N189 Chronic kidney disease, unspecified: Secondary | ICD-10-CM | POA: Diagnosis not present

## 2018-03-19 DIAGNOSIS — Z794 Long term (current) use of insulin: Secondary | ICD-10-CM | POA: Insufficient documentation

## 2018-03-19 DIAGNOSIS — G5603 Carpal tunnel syndrome, bilateral upper limbs: Secondary | ICD-10-CM | POA: Diagnosis not present

## 2018-03-19 DIAGNOSIS — G5601 Carpal tunnel syndrome, right upper limb: Secondary | ICD-10-CM | POA: Diagnosis not present

## 2018-03-19 DIAGNOSIS — E785 Hyperlipidemia, unspecified: Secondary | ICD-10-CM | POA: Insufficient documentation

## 2018-03-19 DIAGNOSIS — G4730 Sleep apnea, unspecified: Secondary | ICD-10-CM | POA: Diagnosis not present

## 2018-03-19 DIAGNOSIS — Z95810 Presence of automatic (implantable) cardiac defibrillator: Secondary | ICD-10-CM | POA: Diagnosis not present

## 2018-03-19 DIAGNOSIS — K219 Gastro-esophageal reflux disease without esophagitis: Secondary | ICD-10-CM | POA: Insufficient documentation

## 2018-03-19 HISTORY — PX: CARPAL TUNNEL RELEASE: SHX101

## 2018-03-19 LAB — GLUCOSE, CAPILLARY
Glucose-Capillary: 93 mg/dL (ref 70–99)
Glucose-Capillary: 71 mg/dL (ref 70–99)

## 2018-03-19 SURGERY — CARPAL TUNNEL RELEASE
Anesthesia: General | Site: Wrist | Laterality: Right

## 2018-03-19 MED ORDER — OXYCODONE HCL 5 MG PO TABS: 5.0000 mg | ORAL_TABLET | Freq: Once | ORAL | Status: DC | PRN

## 2018-03-19 MED ORDER — PROPOFOL 10 MG/ML IV BOLUS
INTRAVENOUS | Status: DC | PRN
  Administered 2018-03-19: 80 mg via INTRAVENOUS

## 2018-03-19 MED ORDER — FENTANYL CITRATE (PF) 250 MCG/5ML IJ SOLN
INTRAMUSCULAR | Status: AC
  Filled 2018-03-19: qty 5

## 2018-03-19 MED ORDER — LIDOCAINE 2% (20 MG/ML) 5 ML SYRINGE
INTRAMUSCULAR | Status: DC | PRN
  Administered 2018-03-19: 100 mg via INTRAVENOUS

## 2018-03-19 MED ORDER — LACTATED RINGERS IV SOLN
INTRAVENOUS | Status: DC
  Administered 2018-03-19: 10:00:00 via INTRAVENOUS

## 2018-03-19 MED ORDER — CHLORHEXIDINE GLUCONATE CLOTH 2 % EX PADS: 6.0000 | MEDICATED_PAD | Freq: Once | CUTANEOUS | Status: DC

## 2018-03-19 MED ORDER — OXYCODONE HCL 5 MG/5ML PO SOLN: 5.0000 mg | Freq: Once | ORAL | Status: DC | PRN

## 2018-03-19 MED ORDER — FENTANYL CITRATE (PF) 250 MCG/5ML IJ SOLN
INTRAMUSCULAR | Status: DC | PRN
  Administered 2018-03-19 (×3): 25 ug via INTRAVENOUS

## 2018-03-19 MED ORDER — 0.9 % SODIUM CHLORIDE (POUR BTL) OPTIME
TOPICAL | Status: DC | PRN
  Administered 2018-03-19: 1000 mL

## 2018-03-19 MED ORDER — BACITRACIN ZINC 500 UNIT/GM EX OINT
TOPICAL_OINTMENT | CUTANEOUS | Status: AC
  Filled 2018-03-19: qty 28.35

## 2018-03-19 MED ORDER — ONDANSETRON HCL 4 MG/2ML IJ SOLN
INTRAMUSCULAR | Status: AC
  Filled 2018-03-19: qty 2

## 2018-03-19 MED ORDER — ONDANSETRON HCL 4 MG/2ML IJ SOLN: 4.0000 mg | Freq: Once | INTRAMUSCULAR | Status: DC | PRN

## 2018-03-19 MED ORDER — CEFAZOLIN SODIUM-DEXTROSE 2-4 GM/100ML-% IV SOLN
2.0000 g | INTRAVENOUS | Status: AC
  Administered 2018-03-19: 2 g via INTRAVENOUS
  Filled 2018-03-19: qty 100

## 2018-03-19 MED ORDER — BACITRACIN ZINC 500 UNIT/GM EX OINT
TOPICAL_OINTMENT | CUTANEOUS | Status: DC | PRN
  Administered 2018-03-19: 1 via TOPICAL

## 2018-03-19 MED ORDER — LIDOCAINE-EPINEPHRINE 0.5 %-1:200000 IJ SOLN
INTRAMUSCULAR | Status: AC
  Filled 2018-03-19: qty 1

## 2018-03-19 MED ORDER — MIDAZOLAM HCL 2 MG/2ML IJ SOLN
INTRAMUSCULAR | Status: AC
  Filled 2018-03-19: qty 2

## 2018-03-19 MED ORDER — LIDOCAINE-EPINEPHRINE 0.5 %-1:200000 IJ SOLN
INTRAMUSCULAR | Status: DC | PRN
  Administered 2018-03-19: 4 mL

## 2018-03-19 MED ORDER — PHENYLEPHRINE 40 MCG/ML (10ML) SYRINGE FOR IV PUSH (FOR BLOOD PRESSURE SUPPORT)
PREFILLED_SYRINGE | INTRAVENOUS | Status: DC | PRN
  Administered 2018-03-19: 120 ug via INTRAVENOUS
  Administered 2018-03-19: 80 ug via INTRAVENOUS

## 2018-03-19 MED ORDER — MIDAZOLAM HCL 2 MG/2ML IJ SOLN
INTRAMUSCULAR | Status: DC | PRN
  Administered 2018-03-19: 2 mg via INTRAVENOUS

## 2018-03-19 MED ORDER — SODIUM BICARBONATE 4 % IV SOLN
INTRAVENOUS | Status: DC | PRN
  Administered 2018-03-19: 1 mL via INTRAVENOUS

## 2018-03-19 MED ORDER — ONDANSETRON HCL 4 MG/2ML IJ SOLN
INTRAMUSCULAR | Status: DC | PRN
  Administered 2018-03-19: 4 mg via INTRAVENOUS

## 2018-03-19 MED ORDER — SODIUM BICARBONATE 4 % IV SOLN
INTRAVENOUS | Status: AC
  Filled 2018-03-19: qty 5

## 2018-03-19 MED ORDER — OXYCODONE HCL 5 MG PO TABS: 5.0000 mg | ORAL_TABLET | Freq: Four times a day (QID) | ORAL | 0 refills | Status: DC | PRN

## 2018-03-19 MED ORDER — PROPOFOL 500 MG/50ML IV EMUL
INTRAVENOUS | Status: DC | PRN
  Administered 2018-03-19: 50 ug/kg/min via INTRAVENOUS

## 2018-03-19 SURGICAL SUPPLY — 36 items
BANDAGE ACE 3X5.8 VEL STRL LF (GAUZE/BANDAGES/DRESSINGS) ×2 IMPLANT
BANDAGE ELASTIC 3 VELCRO ST LF (GAUZE/BANDAGES/DRESSINGS) ×1 IMPLANT
BLADE SURG 15 STRL LF DISP TIS (BLADE) ×1 IMPLANT
BLADE SURG 15 STRL SS (BLADE) ×2
BNDG GAUZE ELAST 4 BULKY (GAUZE/BANDAGES/DRESSINGS) ×2 IMPLANT
CABLE BIPOLOR RESECTION CORD (MISCELLANEOUS) ×2 IMPLANT
COVER WAND RF STERILE (DRAPES) IMPLANT
DECANTER SPIKE VIAL GLASS SM (MISCELLANEOUS) ×2 IMPLANT
DRAPE EXTREMITY T 121X128X90 (DRAPE) ×2 IMPLANT
DRAPE HALF SHEET 40X57 (DRAPES) ×2 IMPLANT
DRSG TELFA 3X8 NADH (GAUZE/BANDAGES/DRESSINGS) ×2 IMPLANT
DURAPREP 26ML APPLICATOR (WOUND CARE) ×2 IMPLANT
GAUZE 4X4 16PLY RFD (DISPOSABLE) ×2 IMPLANT
GLOVE ECLIPSE 6.5 STRL STRAW (GLOVE) ×2 IMPLANT
GLOVE SURG SS PI 7.5 STRL IVOR (GLOVE) ×4 IMPLANT
GOWN STRL REUS W/ TWL LRG LVL3 (GOWN DISPOSABLE) ×2 IMPLANT
GOWN STRL REUS W/ TWL XL LVL3 (GOWN DISPOSABLE) IMPLANT
GOWN STRL REUS W/TWL 2XL LVL3 (GOWN DISPOSABLE) IMPLANT
GOWN STRL REUS W/TWL LRG LVL3 (GOWN DISPOSABLE) ×6
GOWN STRL REUS W/TWL XL LVL3 (GOWN DISPOSABLE)
KIT BASIN OR (CUSTOM PROCEDURE TRAY) ×2 IMPLANT
KIT TURNOVER KIT B (KITS) ×2 IMPLANT
NDL HYPO 25X1 1.5 SAFETY (NEEDLE) ×1 IMPLANT
NEEDLE HYPO 25X1 1.5 SAFETY (NEEDLE) ×2 IMPLANT
NS IRRIG 1000ML POUR BTL (IV SOLUTION) ×2 IMPLANT
PACK SURGICAL SETUP 50X90 (CUSTOM PROCEDURE TRAY) ×2 IMPLANT
PAD ARMBOARD 7.5X6 YLW CONV (MISCELLANEOUS) ×6 IMPLANT
STOCKINETTE 4X48 STRL (DRAPES) ×2 IMPLANT
SUT ETHILON 3 0 PS 1 (SUTURE) ×2 IMPLANT
SYR BULB 3OZ (MISCELLANEOUS) ×2 IMPLANT
SYR CONTROL 10ML LL (SYRINGE) ×2 IMPLANT
TOWEL GREEN STERILE (TOWEL DISPOSABLE) ×2 IMPLANT
TOWEL GREEN STERILE FF (TOWEL DISPOSABLE) ×1 IMPLANT
TUBE CONNECTING 12X1/4 (SUCTIONS) ×2 IMPLANT
UNDERPAD 30X30 (UNDERPADS AND DIAPERS) ×2 IMPLANT
WATER STERILE IRR 1000ML POUR (IV SOLUTION) ×2 IMPLANT

## 2018-03-19 NOTE — Op Note (Signed)
   12:38 PM  PATIENT:  Yvonne Miller  67 y.o. female  PRE-OPERATIVE DIAGNOSIS:  RIGHT Carpal Tunnel Syndrome  POST-OPERATIVE DIAGNOSIS:  right Carpal Tunnel Syndrome  PROCEDURE:  Procedure(s): RIGHT CARPAL TUNNEL RELEASE  SURGEON: Surgeon(s): Ashok Pall, MD  ANESTHESIA:   local and general  EBL:  Total I/O In: 400 [I.V.:400] Out: 25 [Blood:25]  COUNT:per nursing  DICTATION: Yvonne Miller was taken to the operating room, given IV sedation, and placed under a general anesthetic with an LMA and positioned on the operating room table. She had her right upper extremity prepped and draped in a sterile manner. I infiltrated Licodcaine  6/3% 8/453,646 strength epinephrine into the planned incision starting at the proximal palmar crease extending into the hand ~ 1.5cm. I opened the skin with a 15 blade and extended the incision through the skin into the subcutaneous tissue. I used the bipolar cautery to control the subcutaneous bleeding. I dissected sharply through the tissue using forceps also to expose the transverse carpal ligament. I divided the transverse carpal ligament sharply with the 15 blade using the forceps to protect the contents of the carpal tunnel. With the scissors I divided the ligament both proximally and distally to decompress the entire carpal tunnel. I used the scissors to dissect into the forearm to create space to divide the ligament to the proximal palmar crease, and distally into the palm.  I irrigated the wound then closed the incision with vertical interrupted vertical mattress sutures. I placed a sterile dressing, then wrapped the proximal hand and distal forearm with an ace wrap.  PLAN OF CARE: Discharge to home after PACU  PATIENT DISPOSITION:  PACU - hemodynamically stable.   Delay start of Pharmacological VTE agent (>24hrs) due to surgical blood loss or risk of bleeding:  yes

## 2018-03-19 NOTE — Transfer of Care (Signed)
Immediate Anesthesia Transfer of Care Note  Patient: Yvonne Miller  Procedure(s) Performed: RIGHT CARPAL TUNNEL RELEASE (Right Wrist)  Patient Location: PACU  Anesthesia Type:General  Level of Consciousness: awake, alert  and oriented  Airway & Oxygen Therapy: Patient Spontanous Breathing and Patient connected to face mask oxygen  Post-op Assessment: Report given to RN, Post -op Vital signs reviewed and stable and Patient moving all extremities X 4  Post vital signs: Reviewed and stable  Last Vitals:  Vitals Value Taken Time  BP 86/57 03/19/2018 12:14 PM  Temp    Pulse 73 03/19/2018 12:16 PM  Resp 30 03/19/2018 12:16 PM  SpO2 100 % 03/19/2018 12:16 PM  Vitals shown include unvalidated device data.  Last Pain:  Vitals:   03/19/18 1008  PainSc: 0-No pain         Complications: No apparent anesthesia complications

## 2018-03-19 NOTE — Anesthesia Postprocedure Evaluation (Signed)
Anesthesia Post Note  Patient: Yvonne Miller  Procedure(s) Performed: RIGHT CARPAL TUNNEL RELEASE (Right Wrist)     Patient location during evaluation: PACU Anesthesia Type: General Level of consciousness: awake and alert and oriented Pain management: pain level controlled Vital Signs Assessment: post-procedure vital signs reviewed and stable Respiratory status: spontaneous breathing, nonlabored ventilation and respiratory function stable Cardiovascular status: blood pressure returned to baseline and stable Postop Assessment: no apparent nausea or vomiting Anesthetic complications: no    Last Vitals:  Vitals:   03/19/18 1230 03/19/18 1246  BP: 112/60 108/76  Pulse: 74 75  Resp: 15 16  Temp:    SpO2: 100% 100%    Last Pain:  Vitals:   03/19/18 1246  PainSc: 0-No pain                 Cynithia Hakimi A.

## 2018-03-19 NOTE — Anesthesia Procedure Notes (Signed)
Procedure Name: LMA Insertion Date/Time: 03/19/2018 11:43 AM Performed by: Bryson Corona, CRNA Pre-anesthesia Checklist: Patient identified, Emergency Drugs available, Suction available and Patient being monitored Patient Re-evaluated:Patient Re-evaluated prior to induction Oxygen Delivery Method: Circle System Utilized Preoxygenation: Pre-oxygenation with 100% oxygen Induction Type: IV induction Ventilation: Mask ventilation without difficulty LMA: LMA inserted LMA Size: 4.0 Number of attempts: 1 Placement Confirmation: positive ETCO2 Tube secured with: Tape Dental Injury: Teeth and Oropharynx as per pre-operative assessment

## 2018-03-19 NOTE — H&P (Signed)
Yvonne Miller is a 67 y.o. female Whom presents with carpal tunnel syndrome in her right hand. She is admitted for a carpal tunnel release Allergies  Allergen Reactions  . Other Other (See Comments)    NO BLOOD   . Jehovah witness   Past Medical History:  Diagnosis Date  . AICD (automatic cardioverter/defibrillator) present   . Arthritis   . Asthma   . Carpal tunnel syndrome, bilateral   . CKD (chronic kidney disease)   . Diabetes mellitus   . GERD (gastroesophageal reflux disease)   . Hyperlipemia   . Hypertension   . Obesity    morbid  . Sleep apnea    wears CPAP set at 12  . Wears glasses    Past Surgical History:  Procedure Laterality Date  . ABDOMINAL HYSTERECTOMY    . BREAST SURGERY  1968   breast mass excision  . CARPAL TUNNEL RELEASE Left 07/10/2017   Procedure: CARPAL TUNNEL RELEASE;  Surgeon: Ashok Pall, MD;  Location: [facility];  Service: Neurosurgery;  Laterality: Left;  left  . CERVICAL SPINE SURGERY  2006  . CHOLECYSTECTOMY    . DILATION AND CURETTAGE OF UTERUS    . ICD IMPLANT    . JOINT REPLACEMENT    . TONSILLECTOMY  1961  . TOTAL KNEE ARTHROPLASTY Left 01/26/2018   Procedure: LEFT TOTAL KNEE ARTHROPLASTY;  Surgeon: Mcarthur Rossetti, MD;  Location: [facility];  Service: Orthopedics;  Laterality: Left;   Family History  Problem Relation Age of Onset  . Hypertension Mother   . Stroke Mother   . Heart disease Father   . Heart attack Sister   . Heart attack Brother    Social History   Socioeconomic History  . Marital status: Married    Spouse name: Not on file  . Number of children: Not on file  . Years of education: Not on file  . Highest education level: Not on file  Occupational History  . Not on file  Social Needs  . Financial resource strain: Not on file  . Food insecurity:    Worry: Not on file    Inability: Not on file  . Transportation needs:    Medical: Not on file    Non-medical: Not on file  Tobacco Use  . Smoking status: Never  Smoker  . Smokeless tobacco: Never Used  Substance and Sexual Activity  . Alcohol use: No  . Drug use: No  . Sexual activity: Never  Lifestyle  . Physical activity:    Days per week: Not on file    Minutes per session: Not on file  . Stress: Not on file  Relationships  . Social connections:    Talks on phone: Not on file    Gets together: Not on file    Attends religious service: Not on file    Active member of club or organization: Not on file    Attends meetings of clubs or organizations: Not on file    Relationship status: Not on file  . Intimate partner violence:    Fear of current or ex partner: Not on file    Emotionally abused: Not on file    Physically abused: Not on file    Forced sexual activity: Not on file  Other Topics Concern  . Not on file  Social History Narrative  . Not on file   Family History  Problem Relation Age of Onset  . Hypertension Mother   . Stroke Mother   .  Heart disease Father   . Heart attack Sister   . Heart attack Brother   She is alert, oriented by 4. She answers all questions appropriately. Memory, language, attention span, and fund of knowledge are normal. Speech is clear, it is also fluent. Hearing intact to voice. Significant pain with passive abduction, flexion, and extension of the left shoulder. Significant guarding when I perform a check of Tinel sign of the left transverse carpal ligament. Significant Tinel sign present with the left transverse carpal ligament. She has a markedly positive Tinel sign. She has weakness in the left grip 4/5, and 4-/5. Normal strength in the lower extremities. Areflexic at the knees and ankles. No clonus. There is no Hoffmann sign. She does use a cane but has used it for a number of years.  she has had a left carpal tunnel release. She understands the risks and benefits and wishes to proceed with a right carpal tunnel release.

## 2018-03-20 ENCOUNTER — Encounter (HOSPITAL_COMMUNITY): Payer: Self-pay | Admitting: Neurosurgery

## 2018-04-13 ENCOUNTER — Other Ambulatory Visit: Payer: Self-pay

## 2018-04-13 NOTE — Patient Outreach (Signed)
[facility]) Care Management  04/13/2018  Yvonne Miller Oct 23, 1950 [id number]   Medication Adherence call to [name] spoke with patient she is due on Metformin ER 500 mg patient explain she is now taking 1 tablet a day not 1 tablet 2 times a day patient has plenty at this time. Yvonne Miller is showing past due under [place].   [facility] Direct Dial [phone]  Fax [phone] [email or web address]

## 2018-04-14 ENCOUNTER — Ambulatory Visit: Payer: Medicare Other | Admitting: Internal Medicine

## 2018-04-14 ENCOUNTER — Encounter: Payer: Self-pay | Admitting: Internal Medicine

## 2018-04-14 VITALS — BP 112/68 | HR 74 | Temp 97.9°F | Ht 63.0 in | Wt 251.4 lb

## 2018-04-14 DIAGNOSIS — R0981 Nasal congestion: Secondary | ICD-10-CM | POA: Diagnosis not present

## 2018-04-14 DIAGNOSIS — N183 Chronic kidney disease, stage 3 unspecified: Secondary | ICD-10-CM

## 2018-04-14 DIAGNOSIS — Z794 Long term (current) use of insulin: Secondary | ICD-10-CM

## 2018-04-14 DIAGNOSIS — Z6841 Body Mass Index (BMI) 40.0 and over, adult: Secondary | ICD-10-CM

## 2018-04-14 DIAGNOSIS — D631 Anemia in chronic kidney disease: Secondary | ICD-10-CM | POA: Diagnosis not present

## 2018-04-14 DIAGNOSIS — E1122 Type 2 diabetes mellitus with diabetic chronic kidney disease: Secondary | ICD-10-CM

## 2018-04-14 LAB — CBC
Hematocrit: 32.9 % — ABNORMAL LOW (ref 34.0–46.6)
Hemoglobin: 10.7 g/dL — ABNORMAL LOW (ref 11.1–15.9)
MCH: 26.2 pg — ABNORMAL LOW (ref 26.6–33.0)
MCHC: 32.5 g/dL (ref 31.5–35.7)
MCV: 80 fL (ref 79–97)
Platelets: 384 10*3/uL (ref 150–450)
RBC: 4.09 x10E6/uL (ref 3.77–5.28)
RDW: 13.6 % (ref 12.3–15.4)
WBC: 10.3 10*3/uL (ref 3.4–10.8)

## 2018-04-14 MED ORDER — INSULIN NPH ISOPHANE & REGULAR (70-30) 100 UNIT/ML ~~LOC~~ SUSP: 20.0000 [IU] | SUBCUTANEOUS | 5 refills | Status: DC

## 2018-04-14 MED ORDER — FLUTICASONE PROPIONATE 50 MCG/ACT NA SUSP: 1.0000 | Freq: Every day | NASAL | 2 refills | Status: AC

## 2018-04-14 NOTE — Patient Instructions (Signed)
Chronic Kidney Disease, Adult Chronic kidney disease (CKD) happens when the kidneys are damaged during a time of 3 or more months. The kidneys are two organs that do many important jobs in the body. These jobs include:  Removing wastes and extra fluids from the blood.  Making hormones that maintain the amount of fluid in your tissues and blood vessels.  Making sure that the body has the right amount of fluids and chemicals.  Most of the time, this condition does not go away, but it can usually be controlled. Steps must be taken to slow down the kidney damage or stop it from getting worse. Otherwise, the kidneys may stop working. Follow these instructions at home:  Follow your diet as told by your doctor. You may need to avoid alcohol, salty foods (sodium), and foods that are high in potassium, calcium, and protein.  Take over-the-counter and prescription medicines only as told by your doctor. Do not take any new medicines unless your doctor says you can do that. These include vitamins and minerals. ? Medicines and nutritional supplements can make kidney damage worse. ? Your doctor may need to change how much medicine you take.  Do not use any tobacco products. These include cigarettes, chewing tobacco, and e-cigarettes. If you need help quitting, ask your doctor.  Keep all follow-up visits as told by your doctor. This is important.  Check your blood pressure. Tell your doctor if there are changes to your blood pressure.  Get to a healthy weight. Stay at that weight. If you need help with this, ask your doctor.  Start or continue an exercise plan. Try to exercise at least 30 minutes a day, 5 days a week.  Stay up-to-date with your shots (immunizations) as told by your doctor. Contact a doctor if:  Your symptoms get worse.  You have new symptoms. Get help right away if:  You have symptoms of end-stage kidney disease. These include: ? Headaches. ? Skin that is darker or lighter  than normal. ? Numbness in your hands or feet. ? Easy bruising. ? Having hiccups often. ? Chest pain. ? Shortness of breath. ? Stopping of menstrual periods in women.  You have a fever.  You are making very little pee (urine).  You have pain or bleeding when you pee (urinate). This information is not intended to replace advice given to you by your health care provider. Make sure you discuss any questions you have with your health care provider. Document Released: 07/16/2009 Document Revised: 09/27/2015 Document Reviewed: 12/19/2011 Elsevier Interactive Patient Education  2017 Elsevier Inc.  

## 2018-04-15 NOTE — Progress Notes (Signed)
Your blood count is stable.

## 2018-05-19 NOTE — Progress Notes (Signed)
Subjective:     Patient ID: Yvonne Miller , female    DOB: 06-18-1950 , 68 y.o.   MRN: [id number]   Chief Complaint  Patient presents with  . Trulicity f/u  . sinus issues    HPI  She is here today for f/u Trulicity. She was started on this at her last visit. She is now up to 1.5mg  once weekly dosage.  She has tolerated this medication without any issues. She has not experienced any side effects from the medication.     Past Medical History:  Diagnosis Date  . AICD (automatic cardioverter/defibrillator) present   . Arthritis   . Asthma   . Carpal tunnel syndrome, bilateral   . CKD (chronic kidney disease)   . Diabetes mellitus   . GERD (gastroesophageal reflux disease)   . Hyperlipemia   . Hypertension   . Obesity    morbid  . Sleep apnea    wears CPAP set at 12  . Wears glasses      Family History  Problem Relation Age of Onset  . Hypertension Mother   . Stroke Mother   . Heart disease Father   . Heart attack Sister   . Heart attack Brother      Current Outpatient Medications:  .  albuterol (PROVENTIL HFA;VENTOLIN HFA) 108 (90 Base) MCG/ACT inhaler, Inhale 1 puff into the lungs every 4 (four) hours as needed for wheezing or shortness of breath., Disp: , Rfl:  .  amLODipine (NORVASC) 5 MG tablet, Take 5 mg by mouth daily., Disp: , Rfl:  .  aspirin EC 81 MG tablet, Take 81 mg by mouth daily., Disp: , Rfl:  .  B Complex Vitamins (B COMPLEX-B12) TABS, Take 1 tablet by mouth daily., Disp: , Rfl:  .  carvedilol (COREG) 25 MG tablet, Take 37.5 mg by mouth 2 (two) times daily with a meal. , Disp: , Rfl:  .  cholecalciferol (VITAMIN D) 1000 units tablet, Take 1,000 Units by mouth daily., Disp: , Rfl:  .  Cinnamon 500 MG capsule, Take 500 mg by mouth daily., Disp: , Rfl:  .  Dulaglutide (TRULICITY) 1.5 KD/3.2IZ SOPN, Inject into the skin., Disp: , Rfl:  .  hydrALAZINE (APRESOLINE) 50 MG tablet, Take 50 mg by mouth 2 (two) times daily., Disp: , Rfl:  .  insulin  NPH-regular Human (70-30) 100 UNIT/ML injection, Inject 20-60 Units into the skin See admin instructions. 50 units at bedtime, Disp: 10 mL, Rfl: 5 .  metFORMIN (GLUCOPHAGE-XR) 500 MG 24 hr tablet, Take 500 mg by mouth daily with breakfast. , Disp: , Rfl:  .  nystatin (NYSTATIN) powder, Apply topically 3 (three) times daily as needed. As needed, Disp: 60 g, Rfl: 2 .  Omega-3 Fatty Acids (FISH OIL) 1000 MG CAPS, Take 1,000 mg by mouth daily. , Disp: , Rfl:  .  pravastatin (PRAVACHOL) 40 MG tablet, TAKE 1 TABLET BY MOUTH  EVERY DAY (Patient taking differently: Take 40 mg by mouth daily. ), Disp: 90 tablet, Rfl: 0 .  spironolactone (ALDACTONE) 50 MG tablet, Take 50 mg by mouth every morning., Disp: , Rfl:  .  valsartan-hydrochlorothiazide (DIOVAN-HCT) 160-25 MG tablet, TAKE 1 TABLET BY MOUTH  EVERY DAY (Patient taking differently: Take 1 tablet by mouth daily. ), Disp: 90 tablet, Rfl: 0 .  fluticasone (FLONASE) 50 MCG/ACT nasal spray, Place 1 spray into both nostrils daily., Disp: 16 g, Rfl: 2   Allergies  Allergen Reactions  . Other Other (See Comments)  NO BLOOD   . Jehovah witness     Review of Systems  Constitutional: Negative.   HENT: Positive for postnasal drip and sinus pressure.   Respiratory: Negative.   Cardiovascular: Negative.   Gastrointestinal: Negative.   Neurological: Negative.   Psychiatric/Behavioral: Negative.      Today's Vitals   04/14/18 0847  BP: 112/68  Pulse: 74  Temp: 97.9 F (36.6 C)  TempSrc: Oral  Weight: 251 lb 6.4 oz (114 kg)  Height: 5\' 3"  (1.6 m)  PainSc: 0-No pain   Body mass index is 44.53 kg/m.   Objective:  Physical Exam Vitals signs and nursing note reviewed.  Constitutional:      Appearance: Normal appearance. She is obese.  HENT:     Head: Normocephalic and atraumatic.     Right Ear: Tympanic membrane, ear canal and external ear normal.     Left Ear: Tympanic membrane, ear canal and external ear normal.     Mouth/Throat:      Mouth: Mucous membranes are moist.     Pharynx: No posterior oropharyngeal erythema.  Cardiovascular:     Rate and Rhythm: Normal rate and regular rhythm.     Heart sounds: Normal heart sounds.  Pulmonary:     Effort: Pulmonary effort is normal.  Neurological:     Mental Status: She is alert.         Assessment And Plan:     1. Type 2 diabetes mellitus with stage 3 chronic kidney disease, with long-term current use of insulin ([place])  She will continue with Trulicity 1.5mg  once weekly. I plan to gradually wean her off of metformin. She is advised to take M_F and skip the weekends. She is advised to notify me if her sugars go up over 200. She is again advised to incorporate 30 minutes of exercise five days weekly.   2. Sinus congestion  She is advised to try Coricidin HBP. She will let me know if her sx persist. I do not think she needs abx at this time. She was also given a refill of flonase NS.   3. Anemia due to stage 3 chronic kidney disease (HCC)  Chronic. I will check CBC today.   - CBC no Diff  4. Class 3 severe obesity due to excess calories with serious comorbidity and body mass index (BMI) of 40.0 to 44.9 in adult [facility])  She is encouraged to strive for BMI less than 35 to decrease cardiac risk. She is encouraged to exercise 30 minutes five days weekly.   Maximino Greenland, MD

## 2018-05-31 ENCOUNTER — Ambulatory Visit (INDEPENDENT_AMBULATORY_CARE_PROVIDER_SITE_OTHER): Payer: Medicare Other | Admitting: Internal Medicine

## 2018-05-31 ENCOUNTER — Encounter: Payer: Self-pay | Admitting: Internal Medicine

## 2018-05-31 VITALS — BP 116/74 | HR 72 | Temp 97.5°F | Ht 62.8 in | Wt 242.2 lb

## 2018-05-31 DIAGNOSIS — N183 Chronic kidney disease, stage 3 unspecified: Secondary | ICD-10-CM

## 2018-05-31 DIAGNOSIS — E1122 Type 2 diabetes mellitus with diabetic chronic kidney disease: Secondary | ICD-10-CM | POA: Diagnosis not present

## 2018-05-31 DIAGNOSIS — I5042 Chronic combined systolic (congestive) and diastolic (congestive) heart failure: Secondary | ICD-10-CM

## 2018-05-31 DIAGNOSIS — Z794 Long term (current) use of insulin: Secondary | ICD-10-CM

## 2018-05-31 DIAGNOSIS — I130 Hypertensive heart and chronic kidney disease with heart failure and stage 1 through stage 4 chronic kidney disease, or unspecified chronic kidney disease: Secondary | ICD-10-CM | POA: Diagnosis not present

## 2018-05-31 DIAGNOSIS — Z6841 Body Mass Index (BMI) 40.0 and over, adult: Secondary | ICD-10-CM

## 2018-05-31 LAB — BMP8+EGFR
BUN/Creatinine Ratio: 23 (ref 12–28)
BUN: 33 mg/dL — ABNORMAL HIGH (ref 8–27)
CO2: 22 mmol/L (ref 20–29)
Calcium: 9.8 mg/dL (ref 8.7–10.3)
Chloride: 99 mmol/L (ref 96–106)
Creatinine, Ser: 1.46 mg/dL — ABNORMAL HIGH (ref 0.57–1.00)
GFR calc Af Amer: 42 mL/min/{1.73_m2} — ABNORMAL LOW (ref >59)
GFR calc non Af Amer: 37 mL/min/{1.73_m2} — ABNORMAL LOW (ref >59)
Glucose: 67 mg/dL (ref 65–99)
Potassium: 5.3 mmol/L — ABNORMAL HIGH (ref 3.5–5.2)
Sodium: 138 mmol/L (ref 134–144)

## 2018-05-31 LAB — HEMOGLOBIN A1C
Est. average glucose Bld gHb Est-mCnc: 137 mg/dL
Hgb A1c MFr Bld: 6.4 % — ABNORMAL HIGH (ref 4.8–5.6)

## 2018-05-31 MED ORDER — DULAGLUTIDE 1.5 MG/0.5ML ~~LOC~~ SOAJ: 1.5000 mg | SUBCUTANEOUS | 1 refills | Status: DC

## 2018-05-31 NOTE — Patient Instructions (Signed)

## 2018-05-31 NOTE — Progress Notes (Signed)
Subjective:     Patient ID: Yvonne Miller , female    DOB: [birth year] , 68 y.o.   MRN: [id number]   Chief Complaint  Patient presents with  . Trulicity f/u  . Hypertension    HPI  She is here today for f/u Trulicity and dm check. She has done well with this medication. She is pleased with how it has improved sugars. Additionally, she has noticed that it has decreased her appetite. She is now eating smaller portions.   Hypertension  This is a chronic problem. The current episode started more than 1 year ago. The problem has been gradually improving since onset. The problem is controlled. Pertinent negatives include no blurred vision, chest pain, palpitations or shortness of breath.     Past Medical History:  Diagnosis Date  . AICD (automatic cardioverter/defibrillator) present   . Arthritis   . Asthma   . Carpal tunnel syndrome, bilateral   . CKD (chronic kidney disease)   . Diabetes mellitus   . GERD (gastroesophageal reflux disease)   . Hyperlipemia   . Hypertension   . Obesity    morbid  . Sleep apnea    wears CPAP set at 12  . Wears glasses      Family History  Problem Relation Age of Onset  . Hypertension Mother   . Stroke Mother   . Heart disease Father   . Heart attack Sister   . Heart attack Brother      Current Outpatient Medications:  .  albuterol (PROVENTIL HFA;VENTOLIN HFA) 108 (90 Base) MCG/ACT inhaler, Inhale 1 puff into the lungs every 4 (four) hours as needed for wheezing or shortness of breath., Disp: , Rfl:  .  amLODipine (NORVASC) 5 MG tablet, Take 5 mg by mouth daily., Disp: , Rfl:  .  aspirin EC 81 MG tablet, Take 81 mg by mouth daily., Disp: , Rfl:  .  B Complex Vitamins (B COMPLEX-B12) TABS, Take 1 tablet by mouth daily., Disp: , Rfl:  .  carvedilol (COREG) 25 MG tablet, Take 37.5 mg by mouth 2 (two) times daily with a meal. , Disp: , Rfl:  .  cholecalciferol (VITAMIN D) 1000 units tablet, Take 1,000 Units by mouth daily., Disp: , Rfl:  .   Cinnamon 500 MG capsule, Take 500 mg by mouth daily., Disp: , Rfl:  .  Dulaglutide (TRULICITY) 1.5 QJ/3.3LK SOPN, Inject 1.5 mg into the skin once a week., Disp: 12 pen, Rfl: 1 .  fluticasone (FLONASE) 50 MCG/ACT nasal spray, Place 1 spray into both nostrils daily., Disp: 16 g, Rfl: 2 .  hydrALAZINE (APRESOLINE) 50 MG tablet, Take 50 mg by mouth 2 (two) times daily., Disp: , Rfl:  .  insulin NPH-regular Human (70-30) 100 UNIT/ML injection, Inject 20-60 Units into the skin See admin instructions. 50 units at bedtime, Disp: 10 mL, Rfl: 5 .  metFORMIN (GLUCOPHAGE-XR) 500 MG 24 hr tablet, Take 500 mg by mouth daily with breakfast. Monday - Friday, Disp: , Rfl:  .  nystatin (NYSTATIN) powder, Apply topically 3 (three) times daily as needed. As needed, Disp: 60 g, Rfl: 2 .  Omega-3 Fatty Acids (FISH OIL) 1000 MG CAPS, Take 1,000 mg by mouth daily. , Disp: , Rfl:  .  pravastatin (PRAVACHOL) 40 MG tablet, TAKE 1 TABLET BY MOUTH  EVERY DAY (Patient taking differently: Take 40 mg by mouth daily. ), Disp: 90 tablet, Rfl: 0 .  spironolactone (ALDACTONE) 50 MG tablet, Take 50 mg by mouth every  morning., Disp: , Rfl:  .  valsartan-hydrochlorothiazide (DIOVAN-HCT) 160-25 MG tablet, TAKE 1 TABLET BY MOUTH  EVERY DAY (Patient taking differently: Take 1 tablet by mouth daily. ), Disp: 90 tablet, Rfl: 0   Allergies  Allergen Reactions  . Other Other (See Comments)    NO BLOOD   . Jehovah witness     Review of Systems  Constitutional: Negative.   Eyes: Negative for blurred vision.  Respiratory: Negative.  Negative for shortness of breath.   Cardiovascular: Negative.  Negative for chest pain and palpitations.  Gastrointestinal: Negative.   Neurological: Negative.   Psychiatric/Behavioral: Negative.      Today's Vitals   05/31/18 1020  BP: 116/74  Pulse: 72  Temp: (!) 97.5 F (36.4 C)  TempSrc: Oral  SpO2: 98%  Weight: 242 lb 3.2 oz (109.9 kg)  Height: 5' 2.8" (1.595 m)  PainSc: 0-No pain   Body  mass index is 43.18 kg/m.   Objective:  Physical Exam Vitals signs and nursing note reviewed.  Constitutional:      Appearance: Normal appearance. She is obese.  HENT:     Head: Normocephalic and atraumatic.  Cardiovascular:     Rate and Rhythm: Normal rate and regular rhythm.     Heart sounds: Normal heart sounds.  Pulmonary:     Effort: Pulmonary effort is normal.     Breath sounds: Normal breath sounds.  Skin:    General: Skin is warm.  Neurological:     General: No focal deficit present.     Mental Status: She is alert.  Psychiatric:        Mood and Affect: Mood normal.        Behavior: Behavior normal.         Assessment And Plan:     1. Type 2 diabetes mellitus with stage 3 chronic kidney disease, with long-term current use of insulin (HCC)  Chronic. She is advised to stop metformin. I will also decrease her insulin to 45 units nightly. She was congratulated on lifestyle changes she has made thus far. She will continue with Trulicity 1.'5mg'$  once weekly on Mondays.   - Hemoglobin A1c - BMP8+EGFR  2. 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 She will continue with current meds. She is encouraged to resume her regular exercise regimen. She had been going to the pool at [employer]. She reports one closest to her home has closed, so she has to drive farther away to work out.   3. Chronic combined systolic and diastolic congestive heart failure (HCC)  Chronic, yet stable. Pt is advised to stop metformin. She is also encouraged to limit her salt intake.   4. Class 3 severe obesity due to excess calories with serious comorbidity and body mass index (BMI) of 40.0 to 44.9 in adult [facility])  She is encouraged to strive for BMI less than 35 to decrease cardiac risk. She was congratulated on her weight loss thus far.  She is encouraged to focus on losing another ten percent of her body weight, or 24 pounds.   Maximino Greenland, MD

## 2018-06-03 ENCOUNTER — Ambulatory Visit: Payer: Medicare Other | Admitting: Internal Medicine

## 2018-06-10 ENCOUNTER — Other Ambulatory Visit: Payer: Self-pay | Admitting: Internal Medicine

## 2018-06-18 ENCOUNTER — Other Ambulatory Visit: Payer: Self-pay | Admitting: Internal Medicine

## 2018-06-18 MED ORDER — DULAGLUTIDE 1.5 MG/0.5ML ~~LOC~~ SOAJ: 1.5000 mg | SUBCUTANEOUS | 1 refills | Status: DC

## 2018-07-08 ENCOUNTER — Ambulatory Visit: Payer: Medicare Other | Admitting: Internal Medicine

## 2018-07-08 ENCOUNTER — Ambulatory Visit: Payer: Medicare Other

## 2018-07-08 ENCOUNTER — Encounter: Payer: Medicare Other | Admitting: Internal Medicine

## 2018-07-29 DIAGNOSIS — Z95810 Presence of automatic (implantable) cardiac defibrillator: Secondary | ICD-10-CM | POA: Diagnosis not present

## 2018-07-29 DIAGNOSIS — I5022 Chronic systolic (congestive) heart failure: Secondary | ICD-10-CM | POA: Diagnosis not present

## 2018-07-29 DIAGNOSIS — Z4502 Encounter for adjustment and management of automatic implantable cardiac defibrillator: Secondary | ICD-10-CM | POA: Diagnosis not present

## 2018-08-19 ENCOUNTER — Other Ambulatory Visit: Payer: Self-pay | Admitting: Cardiology

## 2018-08-20 NOTE — Telephone Encounter (Signed)
Please fill meds.

## 2018-08-23 ENCOUNTER — Telehealth: Payer: Self-pay

## 2018-08-23 NOTE — Telephone Encounter (Signed)
Notified pt that her test strips may have a recall on them and she needs to call [facility] so they can send her a replacement. YRL,RMA

## 2018-09-07 ENCOUNTER — Ambulatory Visit: Payer: Medicare Other

## 2018-09-07 ENCOUNTER — Encounter: Payer: Self-pay | Admitting: Orthopaedic Surgery

## 2018-09-07 ENCOUNTER — Ambulatory Visit (INDEPENDENT_AMBULATORY_CARE_PROVIDER_SITE_OTHER): Payer: Medicare Other | Admitting: Orthopaedic Surgery

## 2018-09-07 ENCOUNTER — Other Ambulatory Visit: Payer: Self-pay

## 2018-09-07 DIAGNOSIS — Z96652 Presence of left artificial knee joint: Secondary | ICD-10-CM

## 2018-09-07 DIAGNOSIS — M65311 Trigger thumb, right thumb: Secondary | ICD-10-CM | POA: Diagnosis not present

## 2018-09-07 MED ORDER — METHYLPREDNISOLONE ACETATE 40 MG/ML IJ SUSP
20.0000 mg | INTRAMUSCULAR | Status: AC | PRN
  Administered 2018-09-07: 20 mg

## 2018-09-07 MED ORDER — LIDOCAINE HCL 1 % IJ SOLN
0.5000 mL | INTRAMUSCULAR | Status: AC | PRN
  Administered 2018-09-07: .5 mL

## 2018-09-07 NOTE — Progress Notes (Signed)
Office Visit Note   Patient: Yvonne Miller           Date of Birth: [birth year]           MRN: [id number] Visit Date: 09/07/2018              Requested by: [name], [address] [place], [place] [address] PCP: [name], MD   Assessment & Plan: Visit Diagnoses:  1. History of left knee replacement   2. Trigger thumb, right thumb     Plan: She will continue to work on quad strengthening both knees.  Also work on scar tissue mobilization of the left knee.  In regards to her trigger thumb she can apply Voltaren gel over the volar aspect of the thumb near the A1 pulley.  Pain persist or comes worse she will follow-up with [place].  Otherwise follow-up as needed for the left total knee.  Questions encouraged and answered by Dr. Ninfa Linden myself.  Follow-Up Instructions: Return if symptoms worsen or fail to improve.   Orders:  Orders Placed This Encounter  Procedures  . Hand/UE Inj: R thumb A1  . XR Knee 1-2 Views Left   No orders of the defined types were placed in this encounter.     Procedures: Hand/UE Inj: R thumb A1 for trigger finger on 09/07/2018 9:47 AM Details: volar approach Medications: 0.5 mL lidocaine 1 %; 20 mg methylPREDNISolone acetate 40 MG/ML      Clinical Data: No additional findings.   Subjective: Chief Complaint  Patient presents with  . Left Knee - Follow-up    HPI Yvonne Miller returns today 7 months status post left total knee arthroplasty.  She states her left knee is doing great.  She has no complaints in regards to the knee.  She does report that her diabetes was under better control since having the surgery she relates this to increased activity.  She is came off several of her diabetes medications due to the better control since surgery.  Main complaint today is right knee it is getting stuck.  It is worse at night.  She had no known injury to the thumb.  She is tried some Voltaren gel on the dorsal aspect of the thumb and  oral NSAIDs which helps some.  Review of Systems No fevers chills shortness of breath.  Please only see HPI  Objective: Vital Signs: There were no vitals taken for this visit.  Physical Exam Constitutional:      Appearance: She is not ill-appearing or diaphoretic.  Pulmonary:     Effort: Pulmonary effort is normal.  Neurological:     Mental Status: She is alert and oriented to person, place, and time.     Ortho Exam Left knee full extension flexion to approximately 110 degrees.  No instability valgus varus stressing.  Surgical incision healed well with slight keloid.  Calf supple nontender.  Right thumb: Active triggering of the right thumb.  She has tenderness with a palpable nodule at the A1 pulley.  Specialty Comments:  No specialty comments available.  Imaging: Xr Knee 1-2 Views Left  Result Date: 09/07/2018 Left knee AP and lateral views: No acute fracture.  No bony abnormalities.  Status post left total knee arthroplasty with well-seated components.  No evidence of hardware failure.    PMFS History: Patient Active Problem List   Diagnosis Date Noted  . Class 3 severe obesity due to excess calories with serious comorbidity and body mass index (BMI)  of 40.0 to 44.9 in adult [facility] 05/31/2018  . Type 2 diabetes mellitus with stage 3 chronic kidney disease, with long-term current use of insulin ([place]) 03/04/2018  . 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  . Chronic combined systolic and diastolic congestive heart failure ([place]) 03/04/2018  . Chronic renal disease, stage II 03/04/2018  . Status post revision of total replacement of left knee 01/26/2018  . Unilateral primary osteoarthritis, left knee 08/18/2017   Past Medical History:  Diagnosis Date  . AICD (automatic cardioverter/defibrillator) present   . Arthritis   . Asthma   . Carpal tunnel syndrome, bilateral   . CKD (chronic kidney disease)   . Diabetes mellitus   . GERD (gastroesophageal reflux  disease)   . Hyperlipemia   . Hypertension   . Obesity    morbid  . Sleep apnea    wears CPAP set at 11  . Wears glasses     Family History  Problem Relation Age of Onset  . Hypertension Mother   . Stroke Mother   . Heart disease Father   . Heart attack Sister   . Heart attack Brother     Past Surgical History:  Procedure Laterality Date  . ABDOMINAL HYSTERECTOMY    . BREAST SURGERY  1968   breast mass excision  . CARPAL TUNNEL RELEASE Left 07/10/2017   Procedure: CARPAL TUNNEL RELEASE;  Surgeon: Ashok Pall, MD;  Location: [facility];  Service: Neurosurgery;  Laterality: Left;  left  . CARPAL TUNNEL RELEASE Right 03/19/2018   Procedure: RIGHT CARPAL TUNNEL RELEASE;  Surgeon: Ashok Pall, MD;  Location: [facility];  Service: Neurosurgery;  Laterality: Right;  right  . CERVICAL SPINE SURGERY  2006  . CHOLECYSTECTOMY    . DILATION AND CURETTAGE OF UTERUS    . ICD IMPLANT    . JOINT REPLACEMENT    . TONSILLECTOMY  1961  . TOTAL KNEE ARTHROPLASTY Left 01/26/2018   Procedure: LEFT TOTAL KNEE ARTHROPLASTY;  Surgeon: Mcarthur Rossetti, MD;  Location: [facility];  Service: Orthopedics;  Laterality: Left;   Social History   Occupational History  . Not on file  Tobacco Use  . Smoking status: Never Smoker  . Smokeless tobacco: Never Used  Substance and Sexual Activity  . Alcohol use: No  . Drug use: No  . Sexual activity: Never

## 2018-09-20 ENCOUNTER — Other Ambulatory Visit: Payer: Self-pay

## 2018-09-20 NOTE — Patient Outreach (Signed)
[facility]) Care Management  09/20/2018  Yvonne Miller 1950/08/30 [id number]   Medication Adherence call to [name] Hippa Identifiers Verify spoke with patient she is due on Trulicity patient explain she has not pick up this medication from the pharmacy because she does not have money to pay and will get pay this week she will pick up soon, patient has use her last batch and is ok until she picks up from the pharmacy. she ask for Patient's Assisantance on this medication.[name] is showing past due under [facility].   [facility] Management Direct Dial [phone]  Fax [phone] [email or web address]

## 2018-09-28 ENCOUNTER — Other Ambulatory Visit: Payer: Self-pay | Admitting: Internal Medicine

## 2018-09-30 ENCOUNTER — Ambulatory Visit: Payer: Medicare Other

## 2018-09-30 ENCOUNTER — Encounter: Payer: Self-pay | Admitting: Internal Medicine

## 2018-09-30 ENCOUNTER — Other Ambulatory Visit: Payer: Self-pay

## 2018-09-30 ENCOUNTER — Ambulatory Visit (INDEPENDENT_AMBULATORY_CARE_PROVIDER_SITE_OTHER): Payer: Medicare Other | Admitting: Internal Medicine

## 2018-09-30 ENCOUNTER — Ambulatory Visit (INDEPENDENT_AMBULATORY_CARE_PROVIDER_SITE_OTHER): Payer: Medicare Other

## 2018-09-30 VITALS — BP 118/76 | HR 77 | Temp 97.9°F | Ht 63.0 in | Wt 239.8 lb

## 2018-09-30 DIAGNOSIS — N183 Chronic kidney disease, stage 3 unspecified: Secondary | ICD-10-CM

## 2018-09-30 DIAGNOSIS — Z0000 Encounter for general adult medical examination without abnormal findings: Secondary | ICD-10-CM

## 2018-09-30 DIAGNOSIS — Z6841 Body Mass Index (BMI) 40.0 and over, adult: Secondary | ICD-10-CM

## 2018-09-30 DIAGNOSIS — Z794 Long term (current) use of insulin: Secondary | ICD-10-CM

## 2018-09-30 DIAGNOSIS — I890 Lymphedema, not elsewhere classified: Secondary | ICD-10-CM

## 2018-09-30 DIAGNOSIS — E1122 Type 2 diabetes mellitus with diabetic chronic kidney disease: Secondary | ICD-10-CM

## 2018-09-30 DIAGNOSIS — I130 Hypertensive heart and chronic kidney disease with heart failure and stage 1 through stage 4 chronic kidney disease, or unspecified chronic kidney disease: Secondary | ICD-10-CM

## 2018-09-30 DIAGNOSIS — H6123 Impacted cerumen, bilateral: Secondary | ICD-10-CM | POA: Diagnosis not present

## 2018-09-30 DIAGNOSIS — I5042 Chronic combined systolic (congestive) and diastolic (congestive) heart failure: Secondary | ICD-10-CM

## 2018-09-30 LAB — POCT URINALYSIS DIPSTICK
Bilirubin, UA: NEGATIVE
Blood, UA: NEGATIVE
Glucose, UA: NEGATIVE
Ketones, UA: NEGATIVE
Leukocytes, UA: NEGATIVE
Nitrite, UA: NEGATIVE
Protein, UA: NEGATIVE
Spec Grav, UA: 1.025 (ref 1.010–1.025)
Urobilinogen, UA: 0.2 E.U./dL
pH, UA: 5.5 (ref 5.0–8.0)

## 2018-09-30 LAB — POCT UA - MICROALBUMIN
Albumin/Creatinine Ratio, Urine, POC: <30
Creatinine, POC: 300 mg/dL
Microalbumin Ur, POC: 10 mg/L

## 2018-09-30 NOTE — Patient Instructions (Signed)
Yvonne Miller , Thank you for taking time to come for your Medicare Wellness Visit. I appreciate your ongoing commitment to your health goals. Please review the following plan we discussed and let me know if I can assist you in the future.   Screening recommendations/referrals: Colonoscopy: states uses cologuard Mammogram: postponed due to quarantine Bone Density: 09/2017 Recommended yearly ophthalmology/optometry visit for glaucoma screening and checkup Recommended yearly dental visit for hygiene and checkup  Vaccinations: Influenza vaccine: 02/2018 Pneumococcal vaccine: states had does not remember date Tdap vaccine: 2014 per patient Shingles vaccine: 12/2007    Advanced directives: Copy in chart  Conditions/risks identified: Obesity  Next appointment: 09/30/2018   Preventive Care 26 Years and Older, Female Preventive care refers to lifestyle choices and visits with your health care provider that can promote health and wellness. What does preventive care include?  A yearly physical exam. This is also called an annual well check.  Dental exams once or twice a year.  Routine eye exams. Ask your health care provider how often you should have your eyes checked.  Personal lifestyle choices, including:  Daily care of your teeth and gums.  Regular physical activity.  Eating a healthy diet.  Avoiding tobacco and drug use.  Limiting alcohol use.  Practicing safe sex.  Taking low-dose aspirin every day.  Taking vitamin and mineral supplements as recommended by your health care provider. What happens during an annual well check? The services and screenings done by your health care provider during your annual well check will depend on your age, overall health, lifestyle risk factors, and family history of disease. Counseling  Your health care provider may ask you questions about your:  Alcohol use.  Tobacco use.  Drug use.  Emotional well-being.  Home and relationship  well-being.  Sexual activity.  Eating habits.  History of falls.  Memory and ability to understand (cognition).  Work and work Statistician.  Reproductive health. Screening  You may have the following tests or measurements:  Height, weight, and BMI.  Blood pressure.  Lipid and cholesterol levels. These may be checked every 5 years, or more frequently if you are over 14 years old.  Skin check.  Lung cancer screening. You may have this screening every year starting at age 10 if you have a 30-pack-year history of smoking and currently smoke or have quit within the past 15 years.  Fecal occult blood test (FOBT) of the stool. You may have this test every year starting at age 52.  Flexible sigmoidoscopy or colonoscopy. You may have a sigmoidoscopy every 5 years or a colonoscopy every 10 years starting at age 73.  Hepatitis C blood test.  Hepatitis B blood test.  Sexually transmitted disease (STD) testing.  Diabetes screening. This is done by checking your blood sugar (glucose) after you have not eaten for a while (fasting). You may have this done every 1-3 years.  Bone density scan. This is done to screen for osteoporosis. You may have this done starting at age 20.  Mammogram. This may be done every 1-2 years. Talk to your health care provider about how often you should have regular mammograms. Talk with your health care provider about your test results, treatment options, and if necessary, the need for more tests. Vaccines  Your health care provider may recommend certain vaccines, such as:  Influenza vaccine. This is recommended every year.  Tetanus, diphtheria, and acellular pertussis (Tdap, Td) vaccine. You may need a Td booster every 10 years.  Zoster vaccine.  You may need this after age 29.  Pneumococcal 13-valent conjugate (PCV13) vaccine. One dose is recommended after age 89.  Pneumococcal polysaccharide (PPSV23) vaccine. One dose is recommended after age 34.  Talk to your health care provider about which screenings and vaccines you need and how often you need them. This information is not intended to replace advice given to you by your health care provider. Make sure you discuss any questions you have with your health care provider. Document Released: 05/18/2015 Document Revised: 01/09/2016 Document Reviewed: 02/20/2015 Elsevier Interactive Patient Education  2017 [employer] [place] [place] Prevention in the Home Falls can cause injuries. They can happen to people of all ages. There are many things you can do to make your home safe and to help prevent falls. What can I do on the outside of my home?  Regularly fix the edges of walkways and driveways and fix any cracks.  Remove anything that might make you trip as you walk through a door, such as a raised step or threshold.  Trim any bushes or trees on the path to your home.  Use bright outdoor lighting.  Clear any walking paths of anything that might make someone trip, such as rocks or tools.  Regularly check to see if handrails are loose or broken. Make sure that both sides of any steps have handrails.  Any raised decks and porches should have guardrails on the edges.  Have any leaves, snow, or ice cleared regularly.  Use sand or salt on walking paths during winter.  Clean up any spills in your garage right away. This includes oil or grease spills. What can I do in the bathroom?  Use night lights.  Install grab bars by the toilet and in the tub and shower. Do not use towel bars as grab bars.  Use non-skid mats or decals in the tub or shower.  If you need to sit down in the shower, use a plastic, non-slip stool.  Keep the floor dry. Clean up any water that spills on the floor as soon as it happens.  Remove soap buildup in the tub or shower regularly.  Attach bath mats securely with double-sided non-slip rug tape.  Do not have throw rugs and other things on the floor that can make you  trip. What can I do in the bedroom?  Use night lights.  Make sure that you have a light by your bed that is easy to reach.  Do not use any sheets or blankets that are too big for your bed. They should not hang down onto the floor.  Have a firm chair that has side arms. You can use this for support while you get dressed.  Do not have throw rugs and other things on the floor that can make you trip. What can I do in the kitchen?  Clean up any spills right away.  Avoid walking on wet floors.  Keep items that you use a lot in easy-to-reach places.  If you need to reach something above you, use a strong step stool that has a grab bar.  Keep electrical cords out of the way.  Do not use floor polish or wax that makes floors slippery. If you must use wax, use non-skid floor wax.  Do not have throw rugs and other things on the floor that can make you trip. What can I do with my stairs?  Do not leave any items on the stairs.  Make sure that there are handrails on  both sides of the stairs and use them. Fix handrails that are broken or loose. Make sure that handrails are as long as the stairways.  Check any carpeting to make sure that it is firmly attached to the stairs. Fix any carpet that is loose or worn.  Avoid having throw rugs at the top or bottom of the stairs. If you do have throw rugs, attach them to the floor with carpet tape.  Make sure that you have a light switch at the top of the stairs and the bottom of the stairs. If you do not have them, ask someone to add them for you. What else can I do to help prevent falls?  Wear shoes that:  Do not have high heels.  Have rubber bottoms.  Are comfortable and fit you well.  Are closed at the toe. Do not wear sandals.  If you use a stepladder:  Make sure that it is fully opened. Do not climb a closed stepladder.  Make sure that both sides of the stepladder are locked into place.  Ask someone to hold it for you, if  possible.  Clearly mark and make sure that you can see:  Any grab bars or handrails.  First and last steps.  Where the edge of each step is.  Use tools that help you move around (mobility aids) if they are needed. These include:  Canes.  Walkers.  Scooters.  Crutches.  Turn on the lights when you go into a dark area. Replace any light bulbs as soon as they burn out.  Set up your furniture so you have a clear path. Avoid moving your furniture around.  If any of your floors are uneven, fix them.  If there are any pets around you, be aware of where they are.  Review your medicines with your doctor. Some medicines can make you feel dizzy. This can increase your chance of falling. Ask your doctor what other things that you can do to help prevent falls. This information is not intended to replace advice given to you by your health care provider. Make sure you discuss any questions you have with your health care provider. Document Released: 02/15/2009 Document Revised: 09/27/2015 Document Reviewed: 05/26/2014 Elsevier Interactive Patient Education  2017 [employer].

## 2018-09-30 NOTE — Progress Notes (Addendum)
Subjective:     Patient ID: Yvonne Miller , female    DOB: [birth year] , 68 y.o.   MRN: [id number]   Chief Complaint  Patient presents with  . Annual Exam  . Diabetes  . Hypertension    HPI  She is here today for a full physical examination. She is no longer followed by GYN. She has no specific concerns or complaints at this time.   Diabetes  She presents for her follow-up diabetic visit. She has type 2 diabetes mellitus. Her disease course has been stable. There are no hypoglycemic associated symptoms. Pertinent negatives for diabetes include no blurred vision and no chest pain. There are no hypoglycemic complications. Diabetic complications include nephropathy. Risk factors for coronary artery disease include diabetes mellitus, dyslipidemia, hypertension, obesity and sedentary lifestyle. She is compliant with treatment some of the time. She is following a generally healthy diet. She participates in exercise intermittently. Her breakfast blood glucose is taken between 8-9 am. Her breakfast blood glucose range is generally 110-130 mg/dl. An ACE inhibitor/angiotensin II receptor blocker is being taken.  Hypertension  This is a chronic problem. The current episode started more than 1 year ago. The problem has been gradually improving since onset. The problem is controlled. Pertinent negatives include no blurred vision, chest pain, palpitations or shortness of breath. Past treatments include ACE inhibitors, angiotensin blockers and diuretics. Identifiable causes of hypertension include sleep apnea.     Past Medical History:  Diagnosis Date  . AICD (automatic cardioverter/defibrillator) present   . Arthritis   . Asthma   . Carpal tunnel syndrome, bilateral   . CKD (chronic kidney disease)   . Diabetes mellitus   . GERD (gastroesophageal reflux disease)   . Hyperlipemia   . Hypertension   . Obesity    morbid  . Sleep apnea    wears CPAP set at 11  . Wears glasses      Family  History  Problem Relation Age of Onset  . Hypertension Mother   . Stroke Mother   . Heart disease Father   . Heart attack Sister   . Heart attack Brother      Current Outpatient Medications:  .  ACCU-CHEK AVIVA PLUS test strip, CHECK 4 TIMES BY INTRADERMAL ROUTE EVERY DAY, Disp: , Rfl:  .  albuterol (PROVENTIL HFA;VENTOLIN HFA) 108 (90 Base) MCG/ACT inhaler, Inhale 1 puff into the lungs every 4 (four) hours as needed for wheezing or shortness of breath., Disp: , Rfl:  .  amLODipine (NORVASC) 5 MG tablet, TAKE 1 TABLET BY MOUTH  DAILY, Disp: 90 tablet, Rfl: 1 .  aspirin EC 81 MG tablet, Take 81 mg by mouth daily., Disp: , Rfl:  .  B Complex Vitamins (B COMPLEX-B12) TABS, Take 1 tablet by mouth daily., Disp: , Rfl:  .  carvedilol (COREG) 25 MG tablet, Take 37.5 mg by mouth 2 (two) times daily with a meal. , Disp: , Rfl:  .  cholecalciferol (VITAMIN D) 1000 units tablet, Take 1,000 Units by mouth daily., Disp: , Rfl:  .  Cinnamon 500 MG capsule, Take 500 mg by mouth daily., Disp: , Rfl:  .  fluticasone (FLONASE) 50 MCG/ACT nasal spray, Place 1 spray into both nostrils daily. (Patient not taking: Reported on 09/30/2018), Disp: 16 g, Rfl: 2 .  hydrALAZINE (APRESOLINE) 50 MG tablet, TAKE 1 TABLET BY MOUTH TWO  TIMES DAILY, Disp: 180 tablet, Rfl: 2 .  insulin NPH-regular Human (70-30) 100 UNIT/ML injection, Inject 20-60 Units into  the skin See admin instructions. 50 units at bedtime, Disp: 10 mL, Rfl: 5 .  metFORMIN (GLUCOPHAGE-XR) 500 MG 24 hr tablet, Take 500 mg by mouth daily with breakfast. Monday - Friday, Disp: , Rfl:  .  nystatin (NYSTATIN) powder, Apply topically 3 (three) times daily as needed. As needed, Disp: 60 g, Rfl: 2 .  Omega-3 Fatty Acids (FISH OIL) 1000 MG CAPS, Take 1,000 mg by mouth daily. , Disp: , Rfl:  .  pravastatin (PRAVACHOL) 40 MG tablet, TAKE 1 TABLET BY MOUTH  EVERY DAY, Disp: 90 tablet, Rfl: 2 .  spironolactone (ALDACTONE) 50 MG tablet, TAKE 1 TABLET BY MOUTH  EVERY  MORNING, Disp: 90 tablet, Rfl: 2 .  TRULICITY 1.5 HU/7.6LY SOPN, INJECT 1.5 MG INTO THE SKIN ONCE A WEEK., Disp: 2 pen, Rfl: 2 .  valsartan-hydrochlorothiazide (DIOVAN-HCT) 160-25 MG tablet, Take 1 tablet by mouth daily., Disp: 90 tablet, Rfl: 2   Allergies  Allergen Reactions  . Other Other (See Comments)    NO BLOOD   . Jehovah witness     Review of Systems  Constitutional: Negative.   HENT: Negative.   Eyes: Negative.  Negative for blurred vision.  Respiratory: Negative.  Negative for shortness of breath.   Cardiovascular: Negative.  Negative for chest pain and palpitations.  Gastrointestinal: Negative.   Endocrine: Negative.   Genitourinary: Negative.   Musculoskeletal: Negative.   Skin: Negative.   Allergic/Immunologic: Negative.   Neurological: Negative.   Hematological: Negative.   Psychiatric/Behavioral: Negative.      Today's Vitals   09/30/18 0929  BP: 118/76  Pulse: 77  Temp: 97.9 F (36.6 C)  TempSrc: Oral  Weight: 239 lb 12.8 oz (108.8 kg)  Height: '5\' 3"'$  (1.6 m)  PainSc: 0-No pain   Body mass index is 42.48 kg/m.   Objective:  Physical Exam Vitals signs and nursing note reviewed.  Constitutional:      Appearance: Normal appearance. She is obese.  HENT:     Head: Normocephalic and atraumatic.     Right Ear: Ear canal and external ear normal. There is impacted cerumen.     Left Ear: Ear canal and external ear normal. There is impacted cerumen.     Ears:     Comments: Hard wax in canals. Unable to view TM    Nose: Nose normal.     Mouth/Throat:     Mouth: Mucous membranes are moist.     Pharynx: Oropharynx is clear.  Eyes:     Extraocular Movements: Extraocular movements intact.     Conjunctiva/sclera: Conjunctivae normal.     Pupils: Pupils are equal, round, and reactive to light.  Neck:     Musculoskeletal: Normal range of motion and neck supple.  Cardiovascular:     Rate and Rhythm: Normal rate and regular rhythm.     Pulses: Normal pulses.           Dorsalis pedis pulses are 2+ on the right side and 2+ on the left side.     Heart sounds: Normal heart sounds.  Pulmonary:     Effort: Pulmonary effort is normal.     Breath sounds: Normal breath sounds.  Chest:     Breasts:        Right: Normal. No swelling, bleeding, inverted nipple, mass, nipple discharge or skin change.        Left: Normal. No swelling, bleeding, inverted nipple, mass, nipple discharge or skin change.     Comments: Pacemaker located left anterior chest  Abdominal:     General: Bowel sounds are normal.     Palpations: Abdomen is soft.     Comments: Obese - difficult to assess organomegaly.   Genitourinary:    Comments: deferred Musculoskeletal: Normal range of motion.  Feet:     Right foot:     Protective Sensation: 5 sites tested. 5 sites sensed.     Skin integrity: Skin integrity normal.     Toenail Condition: Right toenails are normal.     Left foot:     Protective Sensation: 5 sites tested. 5 sites sensed.     Skin integrity: Skin integrity normal.     Toenail Condition: Left toenails are normal.  Skin:    General: Skin is warm and dry.  Neurological:     General: No focal deficit present.     Mental Status: She is alert and oriented to person, place, and time.  Psychiatric:        Mood and Affect: Mood normal.        Behavior: Behavior normal.         Assessment And Plan:     1. Routine general medical examination at health care facility  A full exam was performed. Importance of monthly self breast exams was discussed with the patient. PATIENT HAS BEEN ADVISED TO GET 30-45 MINUTES REGULAR EXERCISE NO LESS THAN FOUR TO FIVE DAYS PER WEEK - BOTH WEIGHTBEARING EXERCISES AND AEROBIC ARE RECOMMENDED.  SHE IS ADVISED TO FOLLOW A HEALTHY DIET WITH AT LEAST SIX FRUITS/VEGGIES PER DAY, DECREASE INTAKE OF RED MEAT, AND TO INCREASE FISH INTAKE TO TWO DAYS PER WEEK.  MEATS/FISH SHOULD NOT BE FRIED, BAKED OR BROILED IS PREFERABLE.  I SUGGEST WEARING SPF  50 SUNSCREEN ON EXPOSED PARTS AND ESPECIALLY WHEN IN THE DIRECT SUNLIGHT FOR AN EXTENDED PERIOD OF TIME.  PLEASE AVOID FAST FOOD RESTAURANTS AND INCREASE YOUR WATER INTAKE.   2. Type 2 diabetes mellitus with stage 3 chronic kidney disease, with long-term current use of insulin ([place])  Diabetic foot exam was performed.  I DISCUSSED WITH THE PATIENT AT LENGTH REGARDING THE GOALS OF GLYCEMIC CONTROL AND POSSIBLE LONG-TERM COMPLICATIONS.  I  ALSO STRESSED THE IMPORTANCE OF COMPLIANCE WITH HOME GLUCOSE MONITORING, DIETARY RESTRICTIONS INCLUDING AVOIDANCE OF SUGARY DRINKS/PROCESSED FOODS,  ALONG WITH REGULAR EXERCISE.  I  ALSO STRESSED THE IMPORTANCE OF ANNUAL EYE EXAMS, SELF FOOT CARE AND COMPLIANCE WITH OFFICE VISITS.  - CMP14+EGFR - CBC - Lipid panel - Hemoglobin A1c  3. 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 She will continue with current meds. She is encouraged to avoid adding salt to her foods. Again, importance of regular exercise was stressed to the patient. EKG performed, pacemaker rhythm.   - EKG 12-Lead  4. Chronic combined systolic and diastolic congestive heart failure (HCC)  Chronic, yet stable. She is also followed by [facility] and Dr. Einar Gip. She is also s/p pacemaker.   5. Bilateral impacted cerumen  After obtaining verbal consent, both ears were flushed by irrigation without any complications. NO TM abnormalities were noted.  6. Lymphedema of left lower extremity  Chronic. She is encouraged to continue wearing compression hose.   7. Class 3 severe obesity due to excess calories with serious comorbidity and body mass index (BMI) of 40.0 to 44.9 in adult [facility])  Importance of achieving optimal weight to decrease risk of cardiovascular disease and cancers was discussed with the patient in full detail. She is encouraged to start slowly -  start with 10 minutes twice daily at least three to four days per week and to gradually build to  30 minutes five days weekly. She was given tips to incorporate more activity into her daily routine - take stairs when possible, park farther away from grocery stores, etc.    Maximino Greenland, MD    THE PATIENT IS ENCOURAGED TO PRACTICE SOCIAL DISTANCING DUE TO THE COVID-19 PANDEMIC.

## 2018-09-30 NOTE — Addendum Note (Signed)
Addended by: Kellie Simmering on: 09/30/2018 03:28 PM   Modules accepted: Orders

## 2018-09-30 NOTE — Progress Notes (Signed)
Subjective:   Yvonne Miller is a 68 y.o. female who presents for Medicare Annual (Subsequent) preventive examination.  Review of Systems:  n/a Cardiac Risk Factors include: advanced age (>75men, >38 women);diabetes mellitus;hypertension;obesity (BMI >30kg/m2)     Objective:     Vitals: BP 118/76 (BP Location: Left Arm, Patient Position: Sitting, Cuff Size: Large)   Pulse 77   Temp 97.9 F (36.6 C) (Oral)   Ht 5\' 3"  (1.6 m)   Wt 239 lb 12.8 oz (108.8 kg)   SpO2 97%   BMI 42.48 kg/m   Body mass index is 42.48 kg/m.  Advanced Directives 09/30/2018 03/19/2018 03/12/2018 03/03/2018 01/26/2018 01/15/2018 07/01/2017  Does Patient Have a Medical Advance Directive? Yes Yes Yes Yes No Yes Yes  Type of Paramedic of [place];Living will Healthcare Power of [place] of [place] of [place];Living will  Does patient want to make changes to medical advance directive? - - No - Patient declined No - Patient declined No - Patient declined No - Patient declined -  Copy of [place] in Chart? Yes - validated most recent copy scanned in chart (See row information) Yes - validated most recent copy scanned in chart (See row information) Yes - validated most recent copy scanned in chart (See row information) - No - copy requested Yes -  Would patient like information on creating a medical advance directive? - - - - No - Patient declined - -    Tobacco Social History   Tobacco Use  Smoking Status Never Smoker  Smokeless Tobacco Never Used     Counseling given: Not Answered   Clinical Intake:  Pre-visit preparation completed: Yes  Pain : No/denies pain Pain Score: 0-No pain     Nutritional Status: BMI > 30  Obese Nutritional Risks: None Diabetes: Yes CBG done?: No Did pt. bring in CBG monitor from home?: No  How often do you need to have someone help  you when you read instructions, pamphlets, or other written materials from your doctor or pharmacy?: 1 - Never What is the last grade level you completed in school?: BA degree  Interpreter Needed?: No  Information entered by :: NAllen LPN  Past Medical History:  Diagnosis Date  . AICD (automatic cardioverter/defibrillator) present   . Arthritis   . Asthma   . Carpal tunnel syndrome, bilateral   . CKD (chronic kidney disease)   . Diabetes mellitus   . GERD (gastroesophageal reflux disease)   . Hyperlipemia   . Hypertension   . Obesity    morbid  . Sleep apnea    wears CPAP set at 11  . Wears glasses    Past Surgical History:  Procedure Laterality Date  . ABDOMINAL HYSTERECTOMY    . BREAST SURGERY  1968   breast mass excision  . CARPAL TUNNEL RELEASE Left 07/10/2017   Procedure: CARPAL TUNNEL RELEASE;  Surgeon: Ashok Pall, MD;  Location: [place];  Service: Neurosurgery;  Laterality: Left;  left  . CARPAL TUNNEL RELEASE Right 03/19/2018   Procedure: RIGHT CARPAL TUNNEL RELEASE;  Surgeon: Ashok Pall, MD;  Location: [place];  Service: Neurosurgery;  Laterality: Right;  right  . CERVICAL SPINE SURGERY  2006  . CHOLECYSTECTOMY    . DILATION AND CURETTAGE OF UTERUS    . ICD IMPLANT    . JOINT REPLACEMENT    . TONSILLECTOMY  1961  . TOTAL KNEE  ARTHROPLASTY Left 01/26/2018   Procedure: LEFT TOTAL KNEE ARTHROPLASTY;  Surgeon: Mcarthur Rossetti, MD;  Location: [place];  Service: Orthopedics;  Laterality: Left;   Family History  Problem Relation Age of Onset  . Hypertension Mother   . Stroke Mother   . Heart disease Father   . Heart attack Sister   . Heart attack Brother    Social History   Socioeconomic History  . Marital status: Married    Spouse name: Not on file  . Number of children: Not on file  . Years of education: Not on file  . Highest education level: Not on file  Occupational History  . Occupation: retired  Scientific laboratory technician  . Financial resource strain: Not  hard at all  . Food insecurity:    Worry: Never true    Inability: Never true  . Transportation needs:    Medical: No    Non-medical: No  Tobacco Use  . Smoking status: Never Smoker  . Smokeless tobacco: Never Used  Substance and Sexual Activity  . Alcohol use: No  . Drug use: No  . Sexual activity: Yes  Lifestyle  . Physical activity:    Days per week: 3 days    Minutes per session: 10 min  . Stress: Not at all  Relationships  . Social connections:    Talks on phone: Not on file    Gets together: Not on file    Attends religious service: Not on file    Active member of club or organization: Not on file    Attends meetings of clubs or organizations: Not on file    Relationship status: Not on file  Other Topics Concern  . Not on file  Social History Narrative  . Not on file    Outpatient Encounter Medications as of 09/30/2018  Medication Sig  . ACCU-CHEK AVIVA PLUS test strip CHECK 4 TIMES BY INTRADERMAL ROUTE EVERY DAY  . albuterol (PROVENTIL HFA;VENTOLIN HFA) 108 (90 Base) MCG/ACT inhaler Inhale 1 puff into the lungs every 4 (four) hours as needed for wheezing or shortness of breath.  [name] amLODipine (NORVASC) 5 MG tablet TAKE 1 TABLET BY MOUTH  DAILY  . aspirin EC 81 MG tablet Take 81 mg by mouth daily.  . B Complex Vitamins (B COMPLEX-B12) TABS Take 1 tablet by mouth daily.  . carvedilol (COREG) 25 MG tablet Take 37.5 mg by mouth 2 (two) times daily with a meal.   . cholecalciferol (VITAMIN D) 1000 units tablet Take 1,000 Units by mouth daily.  . Cinnamon 500 MG capsule Take 500 mg by mouth daily.  . hydrALAZINE (APRESOLINE) 50 MG tablet TAKE 1 TABLET BY MOUTH TWO  TIMES DAILY  . insulin NPH-regular Human (70-30) 100 UNIT/ML injection Inject 20-60 Units into the skin See admin instructions. 50 units at bedtime  . nystatin (NYSTATIN) powder Apply topically 3 (three) times daily as needed. As needed  . Omega-3 Fatty Acids (FISH OIL) 1000 MG CAPS Take 1,000 mg by mouth  daily.   . pravastatin (PRAVACHOL) 40 MG tablet TAKE 1 TABLET BY MOUTH  EVERY DAY  . spironolactone (ALDACTONE) 50 MG tablet TAKE 1 TABLET BY MOUTH  EVERY MORNING  . TRULICITY 1.5 HU/8.3FG SOPN INJECT 1.5 MG INTO THE SKIN ONCE A WEEK.  . valsartan-hydrochlorothiazide (DIOVAN-HCT) 160-25 MG tablet Take 1 tablet by mouth daily.  . fluticasone (FLONASE) 50 MCG/ACT nasal spray Place 1 spray into both nostrils daily. (Patient not taking: Reported on 09/30/2018)  . metFORMIN (  GLUCOPHAGE-XR) 500 MG 24 hr tablet Take 500 mg by mouth daily with breakfast. Monday - Friday   No facility-administered encounter medications on file as of 09/30/2018.     Activities of Daily Living In your present state of health, do you have any difficulty performing the following activities: 09/30/2018 03/12/2018  Hearing? N N  Vision? N N  Difficulty concentrating or making decisions? N N  Walking or climbing stairs? N N  Comment - -  Dressing or bathing? N N  Doing errands, shopping? N -  Preparing Food and eating ? N -  Using the Toilet? N -  In the past six months, have you accidently leaked urine? N -  Do you have problems with loss of bowel control? N -  Managing your Medications? N -  Managing your Finances? N -  Housekeeping or managing your Housekeeping? N -  Some recent data might be hidden    Patient Care Team: [name], MD as PCP - General (Internal Medicine)    Assessment:   This is a routine wellness examination for Yvonne Miller.  Exercise Activities and Dietary recommendations Current Exercise Habits: Home exercise routine, Type of exercise: walking, Time (Minutes): 10, Frequency (Times/Week): 3, Weekly Exercise (Minutes/Week): 30  Goals    . Weight (lb) < 200 lb (90.7 kg)       Fall Risk Fall Risk  09/30/2018 05/31/2018 04/14/2018 03/04/2018  Falls in the past year? 0 0 0 No  Risk for fall due to : Medication side effect - - -  Follow up Falls prevention discussed;Education provided - - -    Is the patient's home free of loose throw rugs in walkways, pet beds, electrical cords, etc?   yes      Grab bars in the bathroom? no      Handrails on the stairs?   yes      Adequate lighting?   yes  Timed Get Up and Go performed: n/a  Depression Screen PHQ 2/9 Scores 09/30/2018 05/31/2018 04/14/2018 03/04/2018  PHQ - 2 Score 0 0 0 0  PHQ- 9 Score 0 - - -     Cognitive Function     6CIT Screen 09/30/2018  What Year? 0 points  What month? 0 points  What time? 0 points  Count back from 20 0 points  Months in reverse 0 points  Repeat phrase 0 points  Total Score 0    Immunization History  Administered Date(s) Administered  . Influenza, High Dose Seasonal PF 03/04/2018  . Influenza-Unspecified 02/02/2014  . Td 09/08/2006  . Zoster 12/15/2007    Qualifies for Shingles Vaccine? yes  Screening Tests Health Maintenance  Topic Date Due  . FOOT EXAM  07/22/1959  . COLONOSCOPY  09/30/2019 (Originally 07/22/1999)  . TETANUS/TDAP  09/30/2019 (Originally 09/07/2016)  . PNA vac Low Risk Adult (1 of 2 - PCV13) 09/30/2019 (Originally 07/22/2014)  . HEMOGLOBIN A1C  11/29/2018  . INFLUENZA VACCINE  12/04/2018  . OPHTHALMOLOGY EXAM  02/25/2019  . MAMMOGRAM  09/18/2019  . DEXA SCAN  Completed  . Hepatitis C Screening  Completed    Cancer Screenings: Lung: Low Dose CT Chest recommended if Age 68-80 years, 30 pack-year currently smoking OR have quit w/in 15years. Patient does not qualify. Breast:  Up to date on Mammogram? Yes   Up to date of Bone Density/Dexa? Yes Colorectal: uses cologuard  Additional Screenings: : Hepatitis C Screening: 05/28/2012     Plan:   Wants to lose weight. States gets  cologuard and has had within 3 years. States she had pneumonia vaccines. States had a tetanus in 2014.  I have personally reviewed and noted the following in the patient's chart:   . Medical and social history . Use of alcohol, tobacco or illicit drugs  . Current medications and  supplements . Functional ability and status . Nutritional status . Physical activity . Advanced directives . List of other physicians . Hospitalizations, surgeries, and ER visits in previous 12 months . Vitals . Screenings to include cognitive, depression, and falls . Referrals and appointments  In addition, I have reviewed and discussed with patient certain preventive protocols, quality metrics, and best practice recommendations. A written personalized care plan for preventive services as well as general preventive health recommendations were provided to patient.     Kellie Simmering, LPN  [id number]

## 2018-09-30 NOTE — Patient Instructions (Signed)

## 2018-10-01 LAB — HEMOGLOBIN A1C
Est. average glucose Bld gHb Est-mCnc: 146 mg/dL
Hgb A1c MFr Bld: 6.7 % — ABNORMAL HIGH (ref 4.8–5.6)

## 2018-10-01 LAB — CMP14+EGFR
ALT: 16 IU/L (ref 0–32)
AST: 12 IU/L (ref 0–40)
Albumin/Globulin Ratio: 1.6 (ref 1.2–2.2)
Albumin: 4.6 g/dL (ref 3.8–4.8)
Alkaline Phosphatase: 99 IU/L (ref 39–117)
BUN/Creatinine Ratio: 20 (ref 12–28)
BUN: 36 mg/dL — ABNORMAL HIGH (ref 8–27)
Bilirubin Total: 0.2 mg/dL (ref 0.0–1.2)
CO2: 21 mmol/L (ref 20–29)
Calcium: 9.9 mg/dL (ref 8.7–10.3)
Chloride: 101 mmol/L (ref 96–106)
Creatinine, Ser: 1.76 mg/dL — ABNORMAL HIGH (ref 0.57–1.00)
GFR calc Af Amer: 34 mL/min/{1.73_m2} — ABNORMAL LOW (ref >59)
GFR calc non Af Amer: 29 mL/min/{1.73_m2} — ABNORMAL LOW (ref >59)
Globulin, Total: 2.8 g/dL (ref 1.5–4.5)
Glucose: 114 mg/dL — ABNORMAL HIGH (ref 65–99)
Potassium: 5.5 mmol/L — ABNORMAL HIGH (ref 3.5–5.2)
Sodium: 138 mmol/L (ref 134–144)
Total Protein: 7.4 g/dL (ref 6.0–8.5)

## 2018-10-01 LAB — CBC
Hematocrit: 36 % (ref 34.0–46.6)
Hemoglobin: 12 g/dL (ref 11.1–15.9)
MCH: 26.7 pg (ref 26.6–33.0)
MCHC: 33.3 g/dL (ref 31.5–35.7)
MCV: 80 fL (ref 79–97)
Platelets: 367 10*3/uL (ref 150–450)
RBC: 4.49 x10E6/uL (ref 3.77–5.28)
RDW: 13.5 % (ref 11.7–15.4)
WBC: 9.3 10*3/uL (ref 3.4–10.8)

## 2018-10-01 LAB — LIPID PANEL
Chol/HDL Ratio: 3.5 ratio (ref 0.0–4.4)
Cholesterol, Total: 146 mg/dL (ref 100–199)
HDL: 42 mg/dL (ref >39)
LDL Calculated: 75 mg/dL (ref 0–99)
Triglycerides: 143 mg/dL (ref 0–149)
VLDL Cholesterol Cal: 29 mg/dL (ref 5–40)

## 2018-10-04 ENCOUNTER — Other Ambulatory Visit: Payer: Self-pay

## 2018-10-04 NOTE — Patient Outreach (Signed)
[facility]) Care Management  10/04/2018  Yvonne Miller 03/27/1951 [id number]   Medication Adherence call to Mrs. [name] [facility] Identifiers Verify spoke with patient she is due past due on Valsartan/Hctz 160/25 mg and Pravastatin 40 mg patient explain she is taking 1 tablet daily and never miss a dose she has already place an order thru [facility] and is expecting one soon.patient also ask on Patient's Assistance on Trulicity. [name] is showing past due under [place].  [place] Management Direct Dial [phone]  Fax [phone] [email or web address] [email or web address]

## 2018-10-21 ENCOUNTER — Encounter: Payer: Self-pay | Admitting: Cardiology

## 2018-10-21 ENCOUNTER — Ambulatory Visit (INDEPENDENT_AMBULATORY_CARE_PROVIDER_SITE_OTHER): Payer: Medicare Other | Admitting: Cardiology

## 2018-10-21 ENCOUNTER — Telehealth: Payer: Self-pay

## 2018-10-21 VITALS — BP 117/70 | Ht 64.0 in | Wt 237.0 lb

## 2018-10-21 DIAGNOSIS — E875 Hyperkalemia: Secondary | ICD-10-CM

## 2018-10-21 DIAGNOSIS — I10 Essential (primary) hypertension: Secondary | ICD-10-CM

## 2018-10-21 DIAGNOSIS — I5042 Chronic combined systolic (congestive) and diastolic (congestive) heart failure: Secondary | ICD-10-CM | POA: Diagnosis not present

## 2018-10-21 DIAGNOSIS — I428 Other cardiomyopathies: Secondary | ICD-10-CM | POA: Diagnosis not present

## 2018-10-21 DIAGNOSIS — Z6841 Body Mass Index (BMI) 40.0 and over, adult: Secondary | ICD-10-CM

## 2018-10-21 DIAGNOSIS — Z95810 Presence of automatic (implantable) cardiac defibrillator: Secondary | ICD-10-CM

## 2018-10-21 MED ORDER — SPIRONOLACTONE 25 MG PO TABS: 25.0000 mg | ORAL_TABLET | Freq: Every morning | ORAL | 3 refills | Status: DC

## 2018-10-21 NOTE — Progress Notes (Signed)
Virtual Visit via Video Note: This visit type was conducted due to national recommendations for restrictions regarding the COVID-19 Pandemic (e.g. social distancing).  This format is felt to be most appropriate for this patient at this time.  All issues noted in this document were discussed and addressed.  No physical exam was performed (except for noted visual exam findings with Telehealth visits).  The patient has consented to conduct a Telehealth visit and understands insurance will be billed.   I connected with@, on 10/21/18 at  by a video enabled telemedicine application and verified that I am speaking with the correct person using two identifiers.   I discussed the limitations of evaluation and management by telemedicine and the availability of in person appointments. The patient expressed understanding and agreed to proceed.   I have discussed with patient regarding the safety during COVID Pandemic and steps and precautions to be taken including social distancing, frequent hand wash and use of detergent soap, gels with the patient. I asked the patient to avoid touching mouth, nose, eyes, ears with the hands. I encouraged regular walking around the neighborhood and exercise and regular diet, as long as social distancing can be maintained.  Primary Physician/Referring:  [name], MD  Patient ID: Yvonne Miller, female    DOB: 12-21-1950, 68 y.o.   MRN: [id number]  Chief Complaint  Patient presents with  . Congestive Heart Failure  . Hyperlipidemia    HPI: Yvonne Miller  is a 68 y.o. 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 She also has morbid obesity, OSA on CPAP and , hypertension, uncontrolled DM, hyperlipidemia, degenerative joint disease and diabetes mellitus. This is her annual visit.  Had left knee replacement 3/00/7622  without complications.  Has lost about 35 Lbs. No TIA-like symptoms, no focal neurologic symptoms, no bluish discoloration of her toes, no palpitation, dizziness or syncope. She states that her ICD is functioning normally and there is no evidence of heart failure by remote transmission as per [facility].  Past Medical History:  Diagnosis Date  . AICD (automatic cardioverter/defibrillator) present   . Arthritis   . Asthma   . Carpal tunnel syndrome, bilateral   . CKD (chronic kidney disease)   . Diabetes mellitus   . GERD (gastroesophageal reflux disease)   . Hyperlipemia   . Hypertension   . Obesity    morbid  . Sleep apnea    wears CPAP set at 11  . Wears glasses     Past Surgical History:  Procedure Laterality Date  . ABDOMINAL HYSTERECTOMY    . BREAST SURGERY  1968   breast mass excision  . CARPAL TUNNEL RELEASE Left 07/10/2017   Procedure: CARPAL TUNNEL RELEASE;  Surgeon: Ashok Pall, MD;  Location: [place];  Service: Neurosurgery;  Laterality: Left;  left  . CARPAL TUNNEL RELEASE Right 03/19/2018   Procedure: RIGHT CARPAL TUNNEL RELEASE;  Surgeon: Ashok Pall, MD;  Location: [place];  Service: Neurosurgery;  Laterality: Right;  right  . CERVICAL SPINE SURGERY  2006  . CHOLECYSTECTOMY    . DILATION AND CURETTAGE OF UTERUS    . ICD IMPLANT    . JOINT REPLACEMENT    . TONSILLECTOMY  1961  . TOTAL KNEE ARTHROPLASTY Left 01/26/2018   Procedure: LEFT TOTAL KNEE ARTHROPLASTY;  Surgeon: Mcarthur Rossetti, MD;  Location: [place];  Service: Orthopedics;  Laterality: Left;    Social History   Socioeconomic History  . Marital status: Married    Spouse name: Not on file  . Number of children: 2  . Years of education: Not on file  . Highest education level: Not on file  Occupational History  . Occupation: retired  Scientific laboratory technician  . Financial resource strain: Not hard at all  . Food insecurity    Worry: Never true    Inability: Never true  .  Transportation needs    Medical: No    Non-medical: No  Tobacco Use  . Smoking status: Never Smoker  . Smokeless tobacco: Never Used  Substance and Sexual Activity  . Alcohol use: No  . Drug use: No  . Sexual activity: Yes  Lifestyle  . Physical activity    Days per week: 3 days    Minutes per session: 10 min  . Stress: Not at all  Relationships  . Social Herbalist on phone: Not on file    Gets together: Not on file    Attends religious service: Not on file    Active member of club or organization: Not on file    Attends meetings of clubs or organizations: Not on file    Relationship status: Not on file  . Intimate partner violence    Fear of current or ex partner: No    Emotionally abused: No    Physically abused: No    Forced sexual activity: No  Other Topics Concern  . Not on file  Social History Narrative  . Not on file   Review of Systems  Constitution: Negative for chills, decreased appetite, malaise/fatigue and weight gain.  Cardiovascular: Positive for leg swelling (lymphedema left leg and wears support stocking.  ). Negative for dyspnea on exertion and syncope.  Endocrine: Negative for cold intolerance.  Hematologic/Lymphatic: Does not bruise/bleed easily.  Musculoskeletal: Positive for arthritis (bilateral knee). Negative for joint swelling.  Gastrointestinal: Negative for abdominal pain, anorexia, change in bowel habit, hematochezia and melena.  Neurological: Negative for headaches and light-headedness.  Psychiatric/Behavioral: Negative for depression and substance abuse.  All other systems reviewed and are negative.     Objective  Blood pressure 117/70, height 5\' 4"  (1.626 m), weight 237 lb (107.5 kg). Body mass index is 40.68 kg/m.   Physical exam not performed or limited due to virtual visit.  Patient appeared to be in no distress, Neck was supple, respiration was not labored.  Please see exam details from prior visit is as below.  Physical  Exam  Constitutional: She appears well-developed. No distress.  Morbidly obese  HENT:  Head: Atraumatic.  Eyes: Conjunctivae are normal.  Neck: Neck supple. No thyromegaly present.  Short neck and difficult to evaluate JVP  Cardiovascular: Normal rate, regular rhythm, normal heart sounds, intact distal pulses and normal pulses. Exam reveals no gallop.  No murmur heard. Pulses:      Carotid pulses are 2+ on the right side and 2+ on the left side. Femoral and popliteal pulse difficult to feel due to patient's body habitus.   Edema: Non pitting left leg below knee present. Trace right leg edema.   Pulmonary/Chest: Effort normal and breath sounds normal.  Abdominal: Soft. Bowel sounds are normal.  Obese. Pannus present  Musculoskeletal: Normal range of motion.        General: No edema.  Neurological: She is alert.  Skin: Skin is warm and dry.  Psychiatric: She has a  normal mood and affect.   Radiology: No results found.  Laboratory examination:    CMP Latest Ref Rng & Units 09/30/2018 05/31/2018 03/12/2018  Glucose 65 - 99 mg/dL 114(H) 67 83  BUN 8 - 27 mg/dL 36(H) 33(H) 25(H)  Creatinine 0.57 - 1.00 mg/dL 1.76(H) 1.46(H) 1.58(H)  Sodium 134 - 144 mmol/L 138 138 137  Potassium 3.5 - 5.2 mmol/L 5.5(H) 5.3(H) 4.3  Chloride 96 - 106 mmol/L 101 99 106  CO2 20 - 29 mmol/L 21 22 23   Calcium 8.7 - 10.3 mg/dL 9.9 9.8 9.9  Total Protein 6.0 - 8.5 g/dL 7.4 - -  Total Bilirubin 0.0 - 1.2 mg/dL 0.2 - -  Alkaline Phos 39 - 117 IU/L 99 - -  AST 0 - 40 IU/L 12 - -  ALT 0 - 32 IU/L 16 - -   CBC Latest Ref Rng & Units 09/30/2018 04/14/2018 03/12/2018  WBC 3.4 - 10.8 x10E3/uL 9.3 10.3 11.3(H)  Hemoglobin 11.1 - 15.9 g/dL 12.0 10.7(L) 10.6(L)  Hematocrit 34.0 - 46.6 % 36.0 32.9(L) 35.8(L)  Platelets 150 - 450 x10E3/uL 367 384 397   Lipid Panel     Component Value Date/Time   CHOL 146 09/30/2018 1303   TRIG 143 09/30/2018 1303   HDL 42 09/30/2018 1303   CHOLHDL 3.5 09/30/2018 1303    CHOLHDL 3.8 04/22/2009 0603   VLDL 45 (H) 04/22/2009 0603   LDLCALC 75 09/30/2018 1303   HEMOGLOBIN A1C Lab Results  Component Value Date   HGBA1C 6.7 (H) 09/30/2018   MPG 165.68 01/15/2018   TSH No results for input(s): TSH in the last 8760 hours.  Medications   Medications Discontinued During This Encounter  Medication Reason  . metFORMIN (GLUCOPHAGE-XR) 500 MG 24 hr tablet Discontinued by provider  . spironolactone (ALDACTONE) 50 MG tablet    Current Meds  Medication Sig  . albuterol (PROVENTIL HFA;VENTOLIN HFA) 108 (90 Base) MCG/ACT inhaler Inhale 1 puff into the lungs every 4 (four) hours as needed for wheezing or shortness of breath.  [name] amLODipine (NORVASC) 5 MG tablet TAKE 1 TABLET BY MOUTH  DAILY  . aspirin EC 81 MG tablet Take 81 mg by mouth daily.  . B Complex Vitamins (B COMPLEX-B12) TABS Take 1 tablet by mouth daily.  . carvedilol (COREG) 25 MG tablet Take 37.5 mg by mouth 2 (two) times daily with a meal.   . cholecalciferol (VITAMIN D) 1000 units tablet Take 1,000 Units by mouth daily.  . Chromium 1000 MCG TABS Take by mouth daily.  . Cinnamon 500 MG capsule Take 500 mg by mouth daily.  . fluticasone (FLONASE) 50 MCG/ACT nasal spray Place 1 spray into both nostrils daily. (Patient taking differently: Place 1 spray into both nostrils as needed. )  . hydrALAZINE (APRESOLINE) 50 MG tablet TAKE 1 TABLET BY MOUTH TWO  TIMES DAILY  . insulin NPH-regular Human (70-30) 100 UNIT/ML injection Inject 20-60 Units into the skin See admin instructions. 50 units at bedtime (Patient taking differently: Inject 25 Units into the skin at bedtime. )  . Magnesium Hydroxide (MAGNESIA PO) Take 1,350 mg by mouth daily.  [name] nystatin (NYSTATIN) powder Apply topically 3 (three) times daily as needed. As needed  . Omega-3 Fatty Acids (FISH OIL) 1000 MG CAPS Take 1,000 mg by mouth daily.   . pravastatin (PRAVACHOL) 40 MG tablet TAKE 1 TABLET BY MOUTH  EVERY DAY  . spironolactone (ALDACTONE) 25 MG  tablet Take 1 tablet (25 mg total) by mouth every morning.  [name]  TRULICITY 1.5 WI/0.9BD SOPN INJECT 1.5 MG INTO THE SKIN ONCE A WEEK.  . valsartan-hydrochlorothiazide (DIOVAN-HCT) 160-25 MG tablet Take 1 tablet by mouth daily.  . [DISCONTINUED] spironolactone (ALDACTONE) 50 MG tablet TAKE 1 TABLET BY MOUTH  EVERY MORNING    Cardiac Studies:   Echocardiogram [07/06/2014]:  1. Left ventricle cavity is minimally dilated. Mild concentric hypertrophy of the left ventricle. Normal global wall motion. Doppler evidence of grade I (impaired) diastolic dysfunction. Normal systolic function. Calculated EF 55%. 2. Left atrial cavity is mild to moderately dilated. 3. Trace mitral regurgitation. 4. Mild tricuspid regurgitation. No evidence of pulmonary hypertension. 5. c.f. echo. of 09/12/2013, there is remarkable improvement in LV syst. function, EF has improved from 28% to 55%.  Sleep study 2010 (sleep apnea-has a CPAP but doesn't use it every night).  Coronary Angiography  R+ L 03/04/11: and in 2010 Normal coronary arteries. Moderate pulmonary hypertension.  Assessment   Non-ischemic cardiomyopathy (HCC)   Chronic combined systolic and diastolic heart failure (HCC)   Essential hypertension   Class 3 severe obesity due to excess calories with serious comorbidity and body mass index (BMI) of 40.0 to 44.9 in adult [facility])  ICD: Biventricular  [employer] Dynagen X4 CRT-D Model G158 ICD in place 10/28/13 - [place], now wants me to take over.    EKG 10/19/2017: Normal sinus rhythm at rate of 66 bpm, bi-ventricularly paced rhythm. No further analysis. No significant change from EKG 10/24/2016.  Recommendations:   Patient presents for annual visit of CHF, presently doing well and there has not been any recent episode of acute exacerbation in CHF, also underwent a replacement without any periprocedural complications 6 months ago.  She is also lost about 35-40 pounds in weight.  With  regard to hypertension, blood pressure is well controlled, lipids are also well controlled.  She does have diabetic stage III chronic kidney disease, recent potassium level has been high.  We will decrease the dose of Aldactone from 50 mg to 25 mg daily recheck BMP in 10 days to 2 weeks.  She wishes to transfer her ICD care to me.  I'll request [facility] to transfer the care, I'll set her up for a clinic visit for ICD check.  I'll also follow-up on the blood pressure and labs.  Adrian Prows, MD, [facility] 10/21/2018, 9:07 AM [facility] [place] Pager: [phone] Office: [phone] If no answer Cell [phone]

## 2018-10-22 ENCOUNTER — Telehealth: Payer: Self-pay

## 2018-10-27 ENCOUNTER — Telehealth: Payer: Self-pay

## 2018-10-29 ENCOUNTER — Ambulatory Visit (INDEPENDENT_AMBULATORY_CARE_PROVIDER_SITE_OTHER): Payer: Medicare Other | Admitting: Pharmacist

## 2018-10-29 DIAGNOSIS — N183 Chronic kidney disease, stage 3 unspecified: Secondary | ICD-10-CM

## 2018-10-29 DIAGNOSIS — I130 Hypertensive heart and chronic kidney disease with heart failure and stage 1 through stage 4 chronic kidney disease, or unspecified chronic kidney disease: Secondary | ICD-10-CM | POA: Diagnosis not present

## 2018-10-29 DIAGNOSIS — E1122 Type 2 diabetes mellitus with diabetic chronic kidney disease: Secondary | ICD-10-CM | POA: Diagnosis not present

## 2018-10-29 DIAGNOSIS — Z794 Long term (current) use of insulin: Secondary | ICD-10-CM | POA: Diagnosis not present

## 2018-11-02 ENCOUNTER — Other Ambulatory Visit: Payer: Self-pay | Admitting: Pharmacy Technician

## 2018-11-02 NOTE — Patient Outreach (Signed)
[facility]) Care Management  11/02/2018  PRESCILLA MONGER 07/22/1950 [id number]                          Medication Assistance Referral  Referral From: [facility] RPh Jenne Pane [facility] RPh)  Medication/Company: Danelle Berry & Humalog 82/51 / Ralph Leyden Cares Patient application portion:  Mailed Provider application portion: Faxed  to Dr. Baird [facility]    Follow up:  Will follow up with patient in 7-10 business days to confirm application(s) have been received.  Maud Deed Chana Bode Needles Certified Pharmacy Technician [place] Management Direct Dial:[phone]

## 2018-11-03 ENCOUNTER — Ambulatory Visit: Payer: Self-pay

## 2018-11-03 DIAGNOSIS — Z794 Long term (current) use of insulin: Secondary | ICD-10-CM

## 2018-11-03 DIAGNOSIS — N183 Chronic kidney disease, stage 3 unspecified: Secondary | ICD-10-CM

## 2018-11-03 DIAGNOSIS — E1122 Type 2 diabetes mellitus with diabetic chronic kidney disease: Secondary | ICD-10-CM

## 2018-11-03 DIAGNOSIS — I130 Hypertensive heart and chronic kidney disease with heart failure and stage 1 through stage 4 chronic kidney disease, or unspecified chronic kidney disease: Secondary | ICD-10-CM

## 2018-11-03 NOTE — Progress Notes (Signed)
Chronic Care Management   Initial Visit Note  10/29/2018 Name: Yvonne Miller MRN: [id number] DOB: 07-19-1950  Referred by: [name], MD Reason for referral : Chronic Care Management   Yvonne Miller is a 68 y.o. year old female who is a primary care patient of [name], MD. The CCM team was consulted for assistance with chronic disease management and care coordination needs.   Review of patient status, including review of consultants reports, relevant laboratory and other test results, and collaboration with appropriate care team members and the patient's provider was performed as part of comprehensive patient evaluation and provision of chronic care management services.    I spoke with Yvonne Miller by telephone today.  Objective:   Goals Addressed            This Visit's Progress     Patient Stated   . I would like to apply for medication assistance for my diabetes medications (pt-stated)       Current Barriers:  . Financial Barriers  Pharmacist Clinical Goal(s):  [name] Over the next 30 days, patient will work with PharmD to address needs related to applying for medication assistance through [place] for diabetes medications  Interventions: . Comprehensive medication review performed. . Collaboration with [facility] CPhT Etter Sjogren who will assist with application process . Patient requests to be on Trulicity pens and Humalog Kwikpens.  She states financial assistance will allow her to stay on track with her diabetes and not have to ration her medication. . Patient is currently injecting 30 units of insulin at bedtime.  Patient Self Care Activities:  . Self administers medications as prescribed . Attends all scheduled provider appointments . Calls pharmacy for medication refills . Performs ADL's independently . Calls provider office for new concerns or questions  Initial goal documentation     . I would like to manage my chronic health conditions (pt-stated)        Current Barriers:  [name] Knowledge Deficits related to management of chronic conditions  Pharmacist Clinical Goal(s):  [name] Over the next 90 days, patient will work with CCM team & PCP to address needs related to optimized medication management of chronic conditions  Interventions: . Comprehensive medication review performed.  Reviewed medication fill history via insurance claims data confirming patient appears compliant with having his medications filled on time as prescribed by provider. . Reviewed & discussed the following diabetes-related information with patient: o Continue checking blood sugars as directed o Follow ADA recommended "diabetes-friendly" diet  (reviewed healthy snack/food options) o Confirmed current DM regimen: Trulicity 1.5 mg weekly, 70/30 insulin-30 units qHS o Discussed insulin/GLP-1 injection technique; Patient uses AccuCheck Aviva glucometer. o Reviewed medication purpose/side effects-->patient denies adverse events, denies hypoglycemia, reports FBG 90-120s, at night after dinner it maxes out at 150-180, she then injects her insulin at bedtime. o Most recent A1c is 6.7 on 09/30/18 o Patient reports taking aspirin and statin (claims data reports potential compliance issues with statin, will explore). o Continue taking all medications as prescribed by provider . For blood pressure-->reviewed medication regimen: carvedilol, hydralazine, spironolactone (recently reduced to 68m per cards due to hyperkalemia, patient has not filled valsartan/HCTZ recently (will follow up at next visit);  Recent EF improved to 55%. . Will continue to follow.  Patient Self Care Activities:  . Self administers medications as prescribed . Attends all scheduled provider appointments . Calls pharmacy for medication refills . Performs ADL's independently . Performs IADL's independently . Calls provider office for new concerns  or questions  Initial goal documentation         Yvonne Miller was given  information about Chronic Care Management services today including:  1. CCM service includes personalized support from designated clinical staff supervised by her physician, including individualized plan of care and coordination with other care providers 2. 24/7 contact phone numbers for assistance for urgent and routine care needs. 3. Service will only be billed when office clinical staff spend 20 minutes or more in a month to coordinate care. 4. Only one practitioner may furnish and bill the service in a calendar month. 5. The patient may stop CCM services at any time (effective at the end of the month) by phone call to the office staff. 6. The patient will be responsible for cost sharing (co-pay) of up to 20% of the service fee (after annual deductible is met).  Patient agreed to services and verbal consent obtained.   Plan:   The care management team will reach out to the patient again over the next 14 days.   Regina Eck, PharmD, BCPS Clinical Pharmacist, [facility] [place]: [phone]

## 2018-11-03 NOTE — Patient Instructions (Signed)
Visit Information  Goals Addressed            This Visit's Progress     Patient Stated   . I would like to apply for medication assistance for my diabetes medications (pt-stated)       Current Barriers:  . Financial Barriers  Pharmacist Clinical Goal(s):  [name] Over the next 30 days, patient will work with PharmD to address needs related to applying for medication assistance through [place] for diabetes medications  Interventions: . Comprehensive medication review performed. . Collaboration with [facility] CPhT Etter Sjogren who will assist with application process . Patient requests to be on Trulicity pens and Humalog Kwikpens.  She states financial assistance will allow her to stay on track with her diabetes and not have to ration her medication. . Patient is currently injecting 30 units of insulin at bedtime.  Patient Self Care Activities:  . Self administers medications as prescribed . Attends all scheduled provider appointments . Calls pharmacy for medication refills . Performs ADL's independently . Calls provider office for new concerns or questions  Initial goal documentation     . I would like to manage my chronic health conditions (pt-stated)       Current Barriers:  [name] Knowledge Deficits related to management of chronic conditions  Pharmacist Clinical Goal(s):  [name] Over the next 90 days, patient will work with CCM team & PCP to address needs related to optimized medication management of chronic conditions  Interventions: . Comprehensive medication review performed.  Reviewed medication fill history via insurance claims data confirming patient appears compliant with having his medications filled on time as prescribed by provider. . Reviewed & discussed the following diabetes-related information with patient: o Continue checking blood sugars as directed o Follow ADA recommended "diabetes-friendly" diet  (reviewed healthy snack/food options) o Confirmed current DM regimen:  Trulicity 1.5 mg weekly, 70/30 insulin-30 units qHS o Discussed insulin/GLP-1 injection technique; Patient uses AccuCheck Aviva glucometer. o Reviewed medication purpose/side effects-->patient denies adverse events, denies hypoglycemia, reports FBG 90-120s, at night after dinner it maxes out at 150-180, she then injects her insulin at bedtime. o Most recent A1c is 6.7 on 09/30/18 o Patient reports taking aspirin and statin (claims data reports potential compliance issues with statin, will explore). o Continue taking all medications as prescribed by provider . For blood pressure-->reviewed medication regimen: carvedilol, hydralazine, spironolactone (recently reduced to 25mg  per cards due to hyperkalemia, patient has not filled valsartan/HCTZ recently (will follow up at next visit);  Recent EF improved to 55%. . Will continue to follow.  Patient Self Care Activities:  . Self administers medications as prescribed . Attends all scheduled provider appointments . Calls pharmacy for medication refills . Performs ADL's independently . Performs IADL's independently . Calls provider office for new concerns or questions  Initial goal documentation        The patient verbalized understanding of instructions provided today and declined a print copy of patient instruction materials.   The care management team will reach out to the patient again over the next 14 days.   Regina Eck, PharmD, BCPS Clinical Pharmacist, [facility] [place]: [phone]

## 2018-11-03 NOTE — Chronic Care Management (AMB) (Signed)
  Chronic Care Management   Initial Visit Note  11/03/2018 Name: Yvonne Miller MRN: [id number] DOB: April 25, 1951  Referred by: [name], MD Reason for referral : Chronic Care Management (CCM Case Collaboration )   Yvonne Miller is a 68 y.o. year old female who is a primary care patient of [name], MD. The care management team was consulted for assistance with chronic disease management and care coordination needs.   Review of patient status, including review of consultants reports, relevant laboratory and other test results, and collaboration with appropriate care team members and the patient's provider was performed as part of comprehensive patient evaluation and provision of chronic care management services.    I initiated and established the plan of care for Yvonne Miller during one on one collaboration with my clinical care management colleague Lottie Dawson PharmD who is also engaged with this patient to address pharmacy needs.   Goals Addressed    . Assist with Chronic Care Management and Community Resources       Current Barriers:  [name] Knowledge Barriers related to resources and support available to address needs related to Chronic Care Management and Community Resources  Case Manager Clinical Goal(s):  [name] Over the next 30 days, patient will work with the CCM team to address needs related to Chronic Care Management, Medication management and pharmacy resources to assist with med cost and [employer].   Interventions:  . Collaborated with embedded Pharm D and initiated plan of care to address needs related to Chronic Care Management, Medication management and pharmacy resources to assist with med cost and Community Resources  Patient Self Care Activities:  . Self administers medications as prescribed . Attends all scheduled provider appointments . Calls pharmacy for medication refills . Performs ADL's independently . Calls provider office for new concerns or  questions  Initial goal documentation        Telephone follow up appointment with care management team member scheduled for: 11/22/18  Barb Merino, RN, BSN, CCM Care Management Coordinator [facility] [facility]  Direct Phone: [phone]

## 2018-11-04 ENCOUNTER — Ambulatory Visit: Payer: Self-pay

## 2018-11-04 DIAGNOSIS — N183 Chronic kidney disease, stage 3 unspecified: Secondary | ICD-10-CM

## 2018-11-04 DIAGNOSIS — Z794 Long term (current) use of insulin: Secondary | ICD-10-CM

## 2018-11-04 DIAGNOSIS — E1122 Type 2 diabetes mellitus with diabetic chronic kidney disease: Secondary | ICD-10-CM

## 2018-11-04 NOTE — Chronic Care Management (AMB) (Signed)
  Chronic Care Management   Social Work General Note  11/04/2018 Name: [name] MRN: [id number] DOB: 01-Jun-1950  Yvonne Miller is a 68 y.o. year old female who is a primary care patient of [name], MD. The patient recently enrolled into the chronic care management program.   CCM SW conducted an outreach call to introduce self to the patient and assess for resource needs. The patient denies SW needs during today's call. The patient is encouraged to outreach CCM SW if future needs arise.   Follow Up Plan: No planned follow up at this time. The patient will remain engaged with embedded Pharm D for medication assistance.       Daneen Schick, BSW, CDP Social Worker, Certified Dementia Practitioner [place] [facility] [place] Management [phone]  Total time spent performing care coordination and/or care management activities with the patient by phone or face to face = 10 minutes.

## 2018-11-10 ENCOUNTER — Ambulatory Visit: Payer: Self-pay | Admitting: Pharmacist

## 2018-11-10 DIAGNOSIS — I130 Hypertensive heart and chronic kidney disease with heart failure and stage 1 through stage 4 chronic kidney disease, or unspecified chronic kidney disease: Secondary | ICD-10-CM

## 2018-11-10 DIAGNOSIS — E1122 Type 2 diabetes mellitus with diabetic chronic kidney disease: Secondary | ICD-10-CM

## 2018-11-10 DIAGNOSIS — N183 Chronic kidney disease, stage 3 unspecified: Secondary | ICD-10-CM

## 2018-11-10 DIAGNOSIS — Z794 Long term (current) use of insulin: Secondary | ICD-10-CM

## 2018-11-15 ENCOUNTER — Encounter: Payer: Self-pay | Admitting: Internal Medicine

## 2018-11-15 ENCOUNTER — Other Ambulatory Visit: Payer: Self-pay | Admitting: Pharmacy Technician

## 2018-11-15 NOTE — Patient Outreach (Signed)
[facility]) Care Management  11/15/2018  QUANEISHA HANISCH 06-05-1950 [id number]   Received patient portion(s) of patient assistance application for Trulicity and Humalog. Faxed completed application and required documents into [employer].  Will follow up with company in 2-3 business days to check status of application.  Maud Deed Chana Bode [place] Certified Pharmacy Technician [place] Management Direct [phone]

## 2018-11-17 ENCOUNTER — Other Ambulatory Visit: Payer: Self-pay | Admitting: Pharmacy Technician

## 2018-11-17 NOTE — Patient Instructions (Signed)
Visit Information  Goals Addressed            This Visit's Progress     Patient Stated   . I would like to apply for medication assistance for my diabetes medications (pt-stated)       Current Barriers:  . Financial Barriers  Pharmacist Clinical Goal(s):  [name] Over the next 30 days, patient will work with PharmD to address needs related to applying for medication assistance through [place] for diabetes medications  Interventions: . Comprehensive medication review performed. . Collaboration with [employer] CPhT Etter Sjogren who will assist with application process through [employer] . Patient requests to be on Trulicity pens and Humalog 70/30 Kwikpen.  She states financial assistance will allow her to stay on track with her diabetes and she will not have to ration her medication.  She appears stable on this regimen, however we will continue to assess it.  At the time of approval, patient will have medication shipped directly to her home. . Patient is currently injecting 30 units of insulin (70/30) at bedtime. . Will continue to follow.  Patient Self Care Activities:  . Self administers medications as prescribed . Attends all scheduled provider appointments . Calls pharmacy for medication refills . Performs ADL's independently . Calls provider office for new concerns or questions  Initial goal documentation     . I would like to optimize my medication management of my chronic conditions (pt-stated)       Current Barriers:  [name] Knowledge Deficits related to disease state management, Non Adherence to prescribed medication regimen, and Financial Barriers  Pharmacist Clinical Goal(s):  [name] Over the next 90 days, patient will work with CCM PharmD & PCP to address needs related to optimized medication management of chronic conditions  Interventions: . Comprehensive medication review performed.  Reviewed medication fill history via insurance claims data confirming patient appears compliant with  having his medications filled on time as prescribed by provider. . Reviewed & discussed the following diabetes-related information with patient: o Continue checking blood sugars as directed o Follow ADA recommended "diabetes-friendly" diet  (reviewed healthy snack/food options) o Confirmed current DM regimen: Humalog 70/30 KwikPen 30 units qHS, once weekly Trulicity - Patient wishes to stay on this regimen, however we will discuss potentially switching 70/30 mix to Basaglar vs. Tyler Aas for optimal coverage at next visit o Discussed insulin/GLP-1 injection technique; Patient uses AccuCheck Aviva glucometer o Reviewed medication purpose/side effects-->patient denies adverse events, denies hypoglycemia, reports FBG range from 90-120s (<130).  In the evening, her blood glucose ranges from 150-180 o Most recent A1c is 6.7 (5.28.20) slightly up from 6.4 (05/2018).  Will continue to follow o Continue taking all medications as prescribed by provider .   Patient Self Care Activities:  . Self administers medications as prescribed . Attends all scheduled provider appointments . Calls pharmacy for medication refills . Performs ADL's independently . Calls provider office for new concerns or questions  Initial goal documentation        The patient verbalized understanding of instructions provided today and declined a print copy of patient instruction materials.   The care management team will reach out to the patient again over the next 3 weeks.  Regina Eck, PharmD, BCPS Clinical Pharmacist, [facility] [place]: [phone]

## 2018-11-17 NOTE — Patient Outreach (Signed)
[facility]) Care Management  11/17/2018  CATALAYA GARR 1950-09-16 [id number]   Follow up call placed to [facility] regarding patient assistance application(s) for Trulicity and Humalog , Corene Cornea confirms patient has been approved as of 7/14 until 05/05/19. Faxed provider portion to [name]  Follow up:  Will follow up with [name] in 2-3 business days to check shipping status  Maud Deed. Chana Bode [place] Certified Pharmacy Technician [place] Management Direct [phone]

## 2018-11-17 NOTE — Progress Notes (Signed)
Chronic Care Management   Initial Visit Note  11/10/2018 Name: Yvonne Miller MRN: [id number] DOB: 10/25/1950  Referred by: [name], MD Reason for referral : Chronic Care Management   Yvonne Miller is a 68 y.o. year old female who is a primary care patient of [name], MD. The CCM team was consulted for assistance with chronic disease management and care coordination needs.   Review of patient status, including review of consultants reports, relevant laboratory and other test results, and collaboration with appropriate care team members and the patient's provider was performed as part of comprehensive patient evaluation and provision of chronic care management services.    I spoke with Yvonne Miller by telephone today.  Objective:   Goals Addressed            This Visit's Progress     Patient Stated   . I would like to apply for medication assistance for my diabetes medications (pt-stated)       Current Barriers:  . Financial Barriers  Pharmacist Clinical Goal(s):  [name] Over the next 30 days, patient will work with PharmD to address needs related to applying for medication assistance through [place] for diabetes medications  Interventions: . Comprehensive medication review performed. . Collaboration with [employer] CPhT Etter Sjogren who will assist with application process through [employer] . Patient requests to be on Trulicity pens and Humalog 70/30 Kwikpen.  She states financial assistance will allow her to stay on track with her diabetes and she will not have to ration her medication.  She appears stable on this regimen, however we will continue to assess it.  At the time of approval, patient will have medication shipped directly to her home. . Patient is currently injecting 30 units of insulin (70/30) at bedtime. . Will continue to follow.  Patient Self Care Activities:  . Self administers medications as prescribed . Attends all scheduled provider appointments . Calls  pharmacy for medication refills . Performs ADL's independently . Calls provider office for new concerns or questions  Initial goal documentation     . I would like to optimize my medication management of my chronic conditions (pt-stated)       Current Barriers:  [name] Knowledge Deficits related to disease state management, Non Adherence to prescribed medication regimen, and Financial Barriers  Pharmacist Clinical Goal(s):  [name] Over the next 90 days, patient will work with CCM PharmD & PCP to address needs related to optimized medication management of chronic conditions  Interventions: . Comprehensive medication review performed.  Reviewed medication fill history via insurance claims data confirming patient appears compliant with having his medications filled on time as prescribed by provider. . Reviewed & discussed the following diabetes-related information with patient: o Continue checking blood sugars as directed o Follow ADA recommended "diabetes-friendly" diet  (reviewed healthy snack/food options) o Confirmed current DM regimen: Humalog 70/30 KwikPen 30 units qHS, once weekly Trulicity - Patient wishes to stay on this regimen, however we will discuss potentially switching 70/30 mix to Basaglar vs. Tyler Aas for optimal coverage at next visit o Discussed insulin/GLP-1 injection technique; Patient uses AccuCheck Aviva glucometer o Reviewed medication purpose/side effects-->patient denies adverse events, denies hypoglycemia, reports FBG range from 90-120s (<130).  In the evening, her blood glucose ranges from 150-180 o Most recent A1c is 6.7 (5.28.20) slightly up from 6.4 (05/2018).  Will continue to follow o Patient continues to take and fill statin and medications for hypertension.   o Continue taking all medications as prescribed  by provider .   Patient Self Care Activities:  . Self administers medications as prescribed . Attends all scheduled provider appointments . Calls pharmacy for  medication refills . Performs ADL's independently . Calls provider office for new concerns or questions  Initial goal documentation         Plan:   The care management team will reach out to the patient again over the next 3 weeks.  Regina Eck, PharmD, BCPS Clinical Pharmacist, [facility] [place]: [phone]

## 2018-11-22 ENCOUNTER — Telehealth: Payer: Self-pay

## 2018-11-22 ENCOUNTER — Ambulatory Visit: Payer: Self-pay | Admitting: Pharmacist

## 2018-11-22 DIAGNOSIS — Z794 Long term (current) use of insulin: Secondary | ICD-10-CM

## 2018-11-22 DIAGNOSIS — N183 Chronic kidney disease, stage 3 unspecified: Secondary | ICD-10-CM

## 2018-11-22 DIAGNOSIS — E1122 Type 2 diabetes mellitus with diabetic chronic kidney disease: Secondary | ICD-10-CM

## 2018-11-22 DIAGNOSIS — I130 Hypertensive heart and chronic kidney disease with heart failure and stage 1 through stage 4 chronic kidney disease, or unspecified chronic kidney disease: Secondary | ICD-10-CM

## 2018-11-23 ENCOUNTER — Telehealth: Payer: Self-pay

## 2018-11-23 ENCOUNTER — Ambulatory Visit: Payer: Self-pay

## 2018-11-23 DIAGNOSIS — Z794 Long term (current) use of insulin: Secondary | ICD-10-CM

## 2018-11-23 DIAGNOSIS — E1122 Type 2 diabetes mellitus with diabetic chronic kidney disease: Secondary | ICD-10-CM

## 2018-11-23 DIAGNOSIS — I130 Hypertensive heart and chronic kidney disease with heart failure and stage 1 through stage 4 chronic kidney disease, or unspecified chronic kidney disease: Secondary | ICD-10-CM

## 2018-11-23 DIAGNOSIS — N183 Chronic kidney disease, stage 3 unspecified: Secondary | ICD-10-CM

## 2018-11-23 DIAGNOSIS — I5042 Chronic combined systolic (congestive) and diastolic (congestive) heart failure: Secondary | ICD-10-CM

## 2018-11-23 NOTE — Chronic Care Management (AMB) (Signed)
  Chronic Care Management   Outreach Note  11/23/2018 Name: Yvonne Miller MRN: [id number] DOB: 1950/10/20  Referred by: [name], MD Reason for referral : Chronic Care Management (INITIAL CCM RNCM Telephone Outreach )   An unsuccessful telephone outreach was attempted today. The patient was referred to the case management team by [name] MD for assistance with chronic care management and care coordination.   Follow Up Plan: The care management team will reach out to the patient again over the next 7-10 days.   Barb Merino, RN, BSN, CCM Care Management Coordinator [place] [facility]  Direct Phone: [phone]

## 2018-11-25 NOTE — Patient Instructions (Signed)
Visit Information  Goals Addressed            This Visit's Progress     Patient Stated   . I would like to apply for medication assistance for my diabetes medications (pt-stated)       Current Barriers:  . Financial Barriers  Pharmacist Clinical Goal(s):  [name] Over the next 30 days, patient will work with PharmD to address needs related to applying for medication assistance through [place] for diabetes medications  Interventions: . Comprehensive medication review performed. . Collaboration with [employer] CPhT Etter Sjogren who will assist with application process through [employer] . Patient requests to be on Trulicity pens and Humalog 70/30 Kwikpen.  She states financial assistance will allow her to stay on track with her diabetes and she will not have to ration her medication.  She appears stable on this regimen, however we will continue to assess it.  At the time of approval, patient will have medication shipped directly to her home. . Patient is currently injecting 30 units of insulin (70/30) at bedtime and she is on Trulicity 1.5mg  weekly. . Patient has been approved for Trulicity and Humalog 49/44.  Called placed to patient to provide number to [facility] ([name]) to set up delivery.  Patient verbalizes understanding. . Will continue to follow.  Patient Self Care Activities:  . Self administers medications as prescribed . Attends all scheduled provider appointments . Calls pharmacy for medication refills . Performs ADL's independently . Calls provider office for new concerns or questions  Please see past updates related to this goal by clicking on the "Past Updates" button in the selected goal      . I would like to optimize my medication management of my chronic conditions (pt-stated)       Current Barriers:  [name] Knowledge Deficits related to disease state management, Non Adherence to prescribed medication regimen, and Financial Barriers  Pharmacist Clinical Goal(s):   [name] Over the next 90 days, patient will work with CCM PharmD & PCP to address needs related to optimized medication management of chronic conditions  Interventions: . Comprehensive medication review performed.  Reviewed medication fill history via insurance claims data confirming patient appears compliant with having his medications filled on time as prescribed by provider. . Reviewed & discussed the following diabetes-related information with patient: o Continue checking blood sugars as directed o Follow ADA recommended "diabetes-friendly" diet  (reviewed healthy snack/food options) o Confirmed current DM regimen: Humalog 70/30 KwikPen 30 units qHS, once weekly Trulicity - Patient wishes to stay on this regimen, however we will discuss potentially switching 70/30 mix to Basaglar vs. Tyler Aas for optimal coverage at next visit o Discussed insulin/GLP-1 injection technique; Patient uses AccuCheck Aviva glucometer o Reviewed medication purpose/side effects-->patient denies adverse events, denies hypoglycemia, reports FBG range from 90-120s (<130).  In the evening, her blood glucose ranges from 150-180 o Most recent A1c is 6.7 (5.28.20) slightly up from 6.4 (05/2018).  Will continue to follow o Patient continues to take and fill statin and medications for hypertension.   o Continue taking all medications as prescribed by provider   Patient Self Care Activities:  . Self administers medications as prescribed . Attends all scheduled provider appointments . Calls pharmacy for medication refills . Performs ADL's independently . Calls provider office for new concerns or questions  Initial goal documentation        The patient verbalized understanding of instructions provided today and declined a print copy of patient instruction materials.  The care management team will reach out to the patient again over the next 2-3 weeks.  Regina Eck, PharmD, BCPS Clinical Pharmacist, [place]  [facility] [place]: [phone]

## 2018-11-25 NOTE — Chronic Care Management (AMB) (Signed)
Chronic Care Management   Visit Note  11/22/2018 Name: Yvonne Miller MRN: [id number] DOB: 20-Jun-1950  Referred by: [name], MD Reason for referral : Chronic Care Management   Yvonne Miller is a 68 y.o. year old female who is a primary care patient of [name], MD. The CCM team was consulted for assistance with chronic disease management and care coordination needs.   Review of patient status, including review of consultants reports, relevant laboratory and other test results, and collaboration with appropriate care team members and the patient's provider was performed as part of comprehensive patient evaluation and provision of chronic care management services.    I spoke with Yvonne Miller by telephone today.  Objective:   Goals Addressed            This Visit's Progress     Patient Stated   . I would like to apply for medication assistance for my diabetes medications (pt-stated)       Current Barriers:  . Financial Barriers  Pharmacist Clinical Goal(s):  [name] Over the next 30 days, patient will work with PharmD to address needs related to applying for medication assistance through [place] for diabetes medications  Interventions: . Comprehensive medication review performed. . Collaboration with [employer] CPhT Etter Sjogren who will assist with application process through [employer] . Patient requests to be on Trulicity pens and Humalog 70/30 Kwikpen.  She states financial assistance will allow her to stay on track with her diabetes and she will not have to ration her medication.  She appears stable on this regimen, however we will continue to assess it.  At the time of approval, patient will have medication shipped directly to her home. . Patient is currently injecting 30 units of insulin (70/30) at bedtime and she is on Trulicity 1.5mg  weekly. . Patient has been approved for Trulicity and Humalog 58/85.  Called placed to patient to provide number to [facility] ([facility] to set up delivery.  Patient verbalizes understanding. . Will continue to follow.  Patient Self Care Activities:  . Self administers medications as prescribed . Attends all scheduled provider appointments . Calls pharmacy for medication refills . Performs ADL's independently . Calls provider office for new concerns or questions  Please see past updates related to this goal by clicking on the "Past Updates" button in the selected goal      . I would like to optimize my medication management of my chronic conditions (pt-stated)       Current Barriers:  [name] Knowledge Deficits related to disease state management, Non Adherence to prescribed medication regimen, and Financial Barriers  Pharmacist Clinical Goal(s):  [name] Over the next 90 days, patient will work with CCM PharmD & PCP to address needs related to optimized medication management of chronic conditions  Interventions: . Comprehensive medication review performed.  Reviewed medication fill history via insurance claims data confirming patient appears compliant with having his medications filled on time as prescribed by provider. . Reviewed & discussed the following diabetes-related information with patient: o Continue checking blood sugars as directed o Follow ADA recommended "diabetes-friendly" diet  (reviewed healthy snack/food options) o Confirmed current DM regimen: Humalog 70/30 KwikPen 30 units qHS, once weekly Trulicity - Patient wishes to stay on this regimen, however we will discuss potentially switching 70/30 mix to Basaglar vs. Tyler Aas for optimal coverage at next visit o Discussed insulin/GLP-1 injection technique; Patient uses AccuCheck Aviva glucometer o Reviewed medication purpose/side effects-->patient denies adverse events, denies hypoglycemia,  reports FBG range from 90-120s (<130).  In the evening, her blood glucose ranges from 150-180 o Most recent A1c is 6.7 (5.28.20) slightly up from 6.4 (05/2018).  Will  continue to follow o Patient continues to take and fill statin and medications for hypertension.   o Continue taking all medications as prescribed by provider   Patient Self Care Activities:  . Self administers medications as prescribed . Attends all scheduled provider appointments . Calls pharmacy for medication refills . Performs ADL's independently . Calls provider office for new concerns or questions  Initial goal documentation         Plan:   The care management team will reach out to the patient again over the next 2-3 weeks.  Regina Eck, PharmD, BCPS Clinical Pharmacist, [facility] [place]: [phone]

## 2018-12-02 DIAGNOSIS — E119 Type 2 diabetes mellitus without complications: Secondary | ICD-10-CM | POA: Diagnosis not present

## 2018-12-02 LAB — HM DIABETES EYE EXAM

## 2018-12-03 ENCOUNTER — Telehealth: Payer: Self-pay

## 2018-12-10 DIAGNOSIS — Z1231 Encounter for screening mammogram for malignant neoplasm of breast: Secondary | ICD-10-CM | POA: Diagnosis not present

## 2018-12-10 DIAGNOSIS — Z803 Family history of malignant neoplasm of breast: Secondary | ICD-10-CM | POA: Diagnosis not present

## 2018-12-10 LAB — HM MAMMOGRAPHY: HM Mammogram: ABNORMAL — AB (ref 0–4)

## 2018-12-16 ENCOUNTER — Other Ambulatory Visit: Payer: Self-pay | Admitting: Cardiology

## 2018-12-17 DIAGNOSIS — R921 Mammographic calcification found on diagnostic imaging of breast: Secondary | ICD-10-CM | POA: Diagnosis not present

## 2018-12-22 DIAGNOSIS — Z4502 Encounter for adjustment and management of automatic implantable cardiac defibrillator: Secondary | ICD-10-CM | POA: Diagnosis not present

## 2018-12-22 DIAGNOSIS — Z95810 Presence of automatic (implantable) cardiac defibrillator: Secondary | ICD-10-CM | POA: Diagnosis not present

## 2018-12-22 DIAGNOSIS — I428 Other cardiomyopathies: Secondary | ICD-10-CM | POA: Diagnosis not present

## 2018-12-23 ENCOUNTER — Ambulatory Visit (INDEPENDENT_AMBULATORY_CARE_PROVIDER_SITE_OTHER): Payer: Medicare Other | Admitting: Pharmacist

## 2018-12-23 DIAGNOSIS — N183 Chronic kidney disease, stage 3 unspecified: Secondary | ICD-10-CM

## 2018-12-23 DIAGNOSIS — I5042 Chronic combined systolic (congestive) and diastolic (congestive) heart failure: Secondary | ICD-10-CM | POA: Diagnosis not present

## 2018-12-23 DIAGNOSIS — E1122 Type 2 diabetes mellitus with diabetic chronic kidney disease: Secondary | ICD-10-CM

## 2018-12-23 DIAGNOSIS — Z794 Long term (current) use of insulin: Secondary | ICD-10-CM | POA: Diagnosis not present

## 2018-12-23 DIAGNOSIS — I130 Hypertensive heart and chronic kidney disease with heart failure and stage 1 through stage 4 chronic kidney disease, or unspecified chronic kidney disease: Secondary | ICD-10-CM

## 2018-12-24 NOTE — Progress Notes (Signed)
Chronic Care Management    Visit Note  12/23/2018 Name: Yvonne Miller MRN: [id number] DOB: 10-05-1950  Referred by: Yvonne Chard, MD Reason for referral : Chronic Care Management   Yvonne Miller is a 68 y.o. year old female who is a primary care patient of Yvonne Chard, MD. The CCM team was consulted for assistance with chronic disease management and care coordination needs.   Review of patient status, including review of consultants reports, relevant laboratory and other test results, and collaboration with appropriate care team members and the patient's provider was performed as part of comprehensive patient evaluation and provision of chronic care management services.    I spoke with Yvonne Miller by telephone today.  Medications: Outpatient Encounter Medications as of 12/23/2018  Medication Sig  . ACCU-CHEK AVIVA PLUS test strip CHECK 4 TIMES BY INTRADERMAL ROUTE EVERY DAY  . albuterol (PROVENTIL HFA;VENTOLIN HFA) 108 (90 Base) MCG/ACT inhaler Inhale 1 puff into the lungs every 4 (four) hours as needed for wheezing or shortness of breath.  [name] amLODipine (NORVASC) 5 MG tablet TAKE 1 TABLET BY MOUTH  DAILY  . aspirin EC 81 MG tablet Take 81 mg by mouth daily.  . B Complex Vitamins (B COMPLEX-B12) TABS Take 1 tablet by mouth daily.  . carvedilol (COREG) 25 MG tablet TAKE 1 AND 1/2 TABLETS BY  MOUTH TWO TIMES DAILY  . cholecalciferol (VITAMIN D) 1000 units tablet Take 1,000 Units by mouth daily.  . Chromium 1000 MCG TABS Take by mouth daily.  . Cinnamon 500 MG capsule Take 500 mg by mouth daily.  . fluticasone (FLONASE) 50 MCG/ACT nasal spray Place 1 spray into both nostrils daily. (Patient taking differently: Place 1 spray into both nostrils as needed. )  . hydrALAZINE (APRESOLINE) 50 MG tablet TAKE 1 TABLET BY MOUTH TWO  TIMES DAILY  . insulin NPH-regular Human (70-30) 100 UNIT/ML injection Inject 20-60 Units into the skin See admin instructions. 50 units at bedtime (Patient taking  differently: Inject 30 Units into the skin at bedtime. )  . Magnesium Hydroxide (MAGNESIA PO) Take 1,350 mg by mouth daily.  [name] nystatin (NYSTATIN) powder Apply topically 3 (three) times daily as needed. As needed  . Omega-3 Fatty Acids (FISH OIL) 1000 MG CAPS Take 1,000 mg by mouth daily.   . pravastatin (PRAVACHOL) 40 MG tablet TAKE 1 TABLET BY MOUTH  EVERY DAY  . spironolactone (ALDACTONE) 25 MG tablet Take 1 tablet (25 mg total) by mouth every morning.  . TRULICITY 1.5 ER/1.5QM SOPN INJECT 1.5 MG INTO THE SKIN ONCE A WEEK.  . valsartan-hydrochlorothiazide (DIOVAN-HCT) 160-25 MG tablet Take 1 tablet by mouth daily.   No facility-administered encounter medications on file as of 12/23/2018.      Objective:   Goals Addressed            This Visit's Progress     Patient Stated   . COMPLETED: I would like to apply for medication assistance for my diabetes medications (pt-stated)       Current Barriers:  . Financial Barriers  Pharmacist Clinical Goal(s):  [name] Over the next 30 days, patient will work with PharmD to address needs related to applying for medication assistance through [place] for diabetes medications  Interventions: . Comprehensive medication review performed. . Collaboration with [facility] CPhT Yvonne Miller who will assist with application process through [employer] . Patient requests to be on Trulicity pens and Humalog 70/30 Kwikpen.  She states financial assistance will allow her to stay on  track with her diabetes and she will not have to ration her medication.  She appears stable on this regimen, however we will continue to assess it.  At the time of approval, patient will have medication shipped directly to her home. . Patient is currently injecting 30 units of insulin (70/30) at bedtime and she is on Trulicity 1.5mg  weekly. . Patient has been approved for Trulicity and Humalog 49/17.  Called placed to patient to provide number to drug comp[facility] ([name]) to set up  delivery.  Patient verbalizes understanding. . Patient has received [employer] shipment of Trulicity and insulin.  Goal complete  Patient Self Care Activities:  . Self administers medications as prescribed . Attends all scheduled provider appointments . Calls pharmacy for medication refills . Performs ADL's independently . Calls provider office for new concerns or questions  Please see past updates related to this goal by clicking on the "Past Updates" button in the selected goal      . I would like to optimize my medication management of my chronic conditions (pt-stated)       Current Barriers:  [name] Knowledge Deficits related to disease state management, Non Adherence to prescribed medication regimen, and Financial Barriers  Pharmacist Clinical Goal(s):  [name] Over the next 90 days, patient will work with CCM PharmD & PCP to address needs related to optimized medication management of chronic conditions  Interventions: Call completed with patient on 12/23/18 . Comprehensive medication review performed.  Reviewed medication fill history via insurance claims data confirming patient appears compliant with having his medications filled on time as prescribed by provider. . Reviewed & discussed the following diabetes-related information with patient: o Continue checking blood sugars as directed o Follow ADA recommended "diabetes-friendly" diet  (reviewed healthy snack/food options) o Confirmed current DM regimen: Humalog 70/30 KwikPen 30 units qHS, once weekly Trulicity - Patient wishes to stay on this regimen, however we will discuss potentially switching 70/30 mix to Basaglar vs. Tyler Aas for optimal coverage  o Discussed insulin/GLP-1 injection technique; Patient uses AccuCheck Aviva glucometer o Reviewed medication purpose/side effects-->patient denies adverse events, denies hypoglycemia, reports FBG range from 90-120s (<130).  In the evening, her blood glucose ranges from 150-180 o Most recent A1c is  6.7 (5.28.20) slightly up from 6.4 (05/2018).  Will continue to follow as medication assistance will allow patient to stay on track with A1c. o Patient continues to take and fill statin and medications for hypertension.  Pravastatin last filled 10/21/18 for 90 DS. Tolerating well. Denies side effects. o Continue taking all medications as prescribed by provider   Patient Self Care Activities:  . Self administers medications as prescribed . Attends all scheduled provider appointments . Calls pharmacy for medication refills . Performs ADL's independently . Calls provider office for new concerns or questions  Please see past updates related to this goal by clicking on the "Past Updates" button in the selected goal        Plan:   The care management team will reach out to the patient again over the next 4 weeks.  Regina Eck, PharmD, BCPS Clinical Pharmacist, [facility] [place]: [phone]

## 2018-12-24 NOTE — Patient Instructions (Signed)
Visit Information  Goals Addressed            This Visit's Progress     Patient Stated   . COMPLETED: I would like to apply for medication assistance for my diabetes medications (pt-stated)       Current Barriers:  . Financial Barriers  Pharmacist Clinical Goal(s):  [name] Over the next 30 days, patient will work with PharmD to address needs related to applying for medication assistance through [place] for diabetes medications  Interventions: . Comprehensive medication review performed. . Collaboration with [facility] CPhT Etter Sjogren who will assist with application process through [employer] . Patient requests to be on Trulicity pens and Humalog 70/30 Kwikpen.  She states financial assistance will allow her to stay on track with her diabetes and she will not have to ration her medication.  She appears stable on this regimen, however we will continue to assess it.  At the time of approval, patient will have medication shipped directly to her home. . Patient is currently injecting 30 units of insulin (70/30) at bedtime and she is on Trulicity 1.5mg  weekly. . Patient has been approved for Trulicity and Humalog 70/01.  Called placed to patient to provide number to drug comp[facility] ([name]) to set up delivery.  Patient verbalizes understanding. . Patient has received [employer] shipment of Trulicity and insulin.  Goal complete  Patient Self Care Activities:  . Self administers medications as prescribed . Attends all scheduled provider appointments . Calls pharmacy for medication refills . Performs ADL's independently . Calls provider office for new concerns or questions  Please see past updates related to this goal by clicking on the "Past Updates" button in the selected goal      . I would like to optimize my medication management of my chronic conditions (pt-stated)       Current Barriers:  [name] Knowledge Deficits related to disease state management, Non Adherence to prescribed medication  regimen, and Financial Barriers  Pharmacist Clinical Goal(s):  [name] Over the next 90 days, patient will work with CCM PharmD & PCP to address needs related to optimized medication management of chronic conditions  Interventions: Call completed with patient on 12/23/18 . Comprehensive medication review performed.  Reviewed medication fill history via insurance claims data confirming patient appears compliant with having his medications filled on time as prescribed by provider. . Reviewed & discussed the following diabetes-related information with patient: o Continue checking blood sugars as directed o Follow ADA recommended "diabetes-friendly" diet  (reviewed healthy snack/food options) o Confirmed current DM regimen: Humalog 70/30 KwikPen 30 units qHS, once weekly Trulicity - Patient wishes to stay on this regimen, however we will discuss potentially switching 70/30 mix to Basaglar vs. Tyler Aas for optimal coverage  o Discussed insulin/GLP-1 injection technique; Patient uses AccuCheck Aviva glucometer o Reviewed medication purpose/side effects-->patient denies adverse events, denies hypoglycemia, reports FBG range from 90-120s (<130).  In the evening, her blood glucose ranges from 150-180 o Most recent A1c is 6.7 (5.28.20) slightly up from 6.4 (05/2018).  Will continue to follow as medication assistance will allow patient to stay on track with A1c. o Patient continues to take and fill statin and medications for hypertension.  Pravastatin last filled 10/21/18 for 90 DS. Tolerating well. Denies side effects. o Continue taking all medications as prescribed by provider   Patient Self Care Activities:  . Self administers medications as prescribed . Attends all scheduled provider appointments . Calls pharmacy for medication refills . Performs ADL's independently .  Calls provider office for new concerns or questions  Please see past updates related to this goal by clicking on the "Past Updates" button  in the selected goal         The patient verbalized understanding of instructions provided today and declined a print copy of patient instruction materials.   The care management team will reach out to the patient again over the next 4 weeks.   Regina Eck, PharmD, BCPS Clinical Pharmacist, [facility] [place]: [phone]

## 2018-12-28 ENCOUNTER — Encounter: Payer: Self-pay | Admitting: Internal Medicine

## 2018-12-29 DIAGNOSIS — H401131 Primary open-angle glaucoma, bilateral, mild stage: Secondary | ICD-10-CM | POA: Diagnosis not present

## 2018-12-30 ENCOUNTER — Telehealth: Payer: Self-pay

## 2019-01-04 ENCOUNTER — Encounter: Payer: Self-pay | Admitting: Internal Medicine

## 2019-01-13 DIAGNOSIS — H401131 Primary open-angle glaucoma, bilateral, mild stage: Secondary | ICD-10-CM | POA: Diagnosis not present

## 2019-01-27 ENCOUNTER — Telehealth: Payer: Self-pay

## 2019-01-27 ENCOUNTER — Ambulatory Visit: Payer: Medicare Other | Admitting: Cardiology

## 2019-01-27 NOTE — Telephone Encounter (Signed)
This pt has a ICD check with [place] 02/18/2019 was not aware we are suppose to be following her?

## 2019-01-27 NOTE — Telephone Encounter (Signed)
She follows [facility], but was thinking about transfer to [place]. I am fine which ever way she wants.  My last notes: She wishes to transfer her ICD care to me.  I'll request [facility] to transfer the care, I'll set her up for a clinic visit for ICD check.  I'll also follow-up on the blood pressure and labs.

## 2019-01-27 NOTE — Telephone Encounter (Signed)
Ok I just put her in our system she is in you can look at recent transmissions if you would like; Also whats the Dx and is she monthly or evey 91days

## 2019-01-27 NOTE — Telephone Encounter (Signed)
Chronic combined systolic and diastolic heart failure (HCC)  Monthly for fluid and device function

## 2019-01-28 NOTE — Telephone Encounter (Signed)
Done

## 2019-01-30 ENCOUNTER — Other Ambulatory Visit: Payer: Self-pay | Admitting: Cardiology

## 2019-02-01 ENCOUNTER — Telehealth: Payer: Self-pay

## 2019-02-02 ENCOUNTER — Ambulatory Visit (INDEPENDENT_AMBULATORY_CARE_PROVIDER_SITE_OTHER): Payer: Medicare Other | Admitting: Pharmacist

## 2019-02-02 DIAGNOSIS — Z794 Long term (current) use of insulin: Secondary | ICD-10-CM

## 2019-02-02 DIAGNOSIS — N183 Chronic kidney disease, stage 3 unspecified: Secondary | ICD-10-CM

## 2019-02-02 DIAGNOSIS — L811 Chloasma: Secondary | ICD-10-CM | POA: Diagnosis not present

## 2019-02-02 DIAGNOSIS — I130 Hypertensive heart and chronic kidney disease with heart failure and stage 1 through stage 4 chronic kidney disease, or unspecified chronic kidney disease: Secondary | ICD-10-CM

## 2019-02-02 DIAGNOSIS — D485 Neoplasm of uncertain behavior of skin: Secondary | ICD-10-CM | POA: Diagnosis not present

## 2019-02-02 DIAGNOSIS — L821 Other seborrheic keratosis: Secondary | ICD-10-CM | POA: Diagnosis not present

## 2019-02-02 DIAGNOSIS — E1122 Type 2 diabetes mellitus with diabetic chronic kidney disease: Secondary | ICD-10-CM | POA: Diagnosis not present

## 2019-02-02 DIAGNOSIS — L720 Epidermal cyst: Secondary | ICD-10-CM | POA: Diagnosis not present

## 2019-02-02 NOTE — Patient Instructions (Signed)
Visit Information  Goals Addressed            This Visit's Progress     Patient Stated   . I would like to optimize my medication management of my chronic conditions (pt-stated)       Current Barriers:  [name] Knowledge Deficits related to disease state management, Non Adherence to prescribed medication regimen, and Financial Barriers  Pharmacist Clinical Goal(s):  [name] Over the next 90 days, patient will work with CCM PharmD & PCP to address needs related to optimized medication management of chronic conditions  PharmD Interventions: Call completed with patient on 02/02/19 . Comprehensive medication review performed.  Reviewed medication fill history via insurance claims data confirming patient appears compliant with having his medications filled on time as prescribed by provider. . Reviewed & discussed the following diabetes-related information with patient: o Continue checking blood sugars as directed o Follow ADA recommended "diabetes-friendly" diet  (reviewed healthy snack/food options) o Confirmed current DM regimen: Humalog 70/30 KwikPen 30 units qHS, once weekly Trulicity (phone number given to patient to call in refills to [employer] patient assistance program) - Patient wishes to stay on this regimen, however we will continue to discuss potentially switching 70/30 mix to Basaglar vs. Tyler Aas for optimal coverage  o Discussed insulin/GLP-1 injection technique; Patient uses AccuCheck Aviva glucometer o Reviewed medication purpose/side effects-->patient denies adverse events, denies hypoglycemia, reports FBG range from 90-120s (<130).  In the evening, her blood glucose ranges from 150-170 (depends on diet) o Most recent A1c is 6.7 (5.28.20) slightly up from 6.4 (05/2018).  Will continue to follow as medication assistance will allow patient to stay on track with A1c. o Patient continues to take and fill statin and medications for hypertension.  Pravastatin last filled 12/28/18 for #90 DS.  Tolerating well. Denies side effects. o Continue taking all medications as prescribed by provider   Patient Self Care Activities:  . Self administers medications as prescribed . Attends all scheduled provider appointments . Calls pharmacy for medication refills . Performs ADL's independently . Calls provider office for new concerns or questions  Please see past updates related to this goal by clicking on the "Past Updates" button in the selected goal         The patient verbalized understanding of instructions provided today and declined a print copy of patient instruction materials.   The care management team will reach out to the patient again over the next 6-8 weeks.  Regina Eck, PharmD, BCPS Clinical Pharmacist, [facility] [place]: [phone]

## 2019-02-02 NOTE — Progress Notes (Signed)
Chronic Care Management   Visit Note  02/02/2019 Name: Yvonne Miller MRN: [id number] DOB: 1951/03/09  Referred by: [name], MD Reason for referral : Chronic Care Management   Yvonne Miller is a 68 y.o. year old female who is a primary care patient of [name], MD. The CCM team was consulted for assistance with chronic disease management and care coordination needs.   Review of patient status, including review of consultants reports, relevant laboratory and other test results, and collaboration with appropriate care team members and the patient's provider was performed as part of comprehensive patient evaluation and provision of chronic care management services.    I spoke with Yvonne Miller by telephone today.  Advanced Directives Status: N See Care Plan and [employer] application for related entries.   Medications: Outpatient Encounter Medications as of 02/02/2019  Medication Sig  . ACCU-CHEK AVIVA PLUS test strip CHECK 4 TIMES BY INTRADERMAL ROUTE EVERY DAY  . albuterol (PROVENTIL HFA;VENTOLIN HFA) 108 (90 Base) MCG/ACT inhaler Inhale 1 puff into the lungs every 4 (four) hours as needed for wheezing or shortness of breath.  [name] amLODipine (NORVASC) 5 MG tablet TAKE 1 TABLET BY MOUTH  DAILY  . aspirin EC 81 MG tablet Take 81 mg by mouth daily.  . B Complex Vitamins (B COMPLEX-B12) TABS Take 1 tablet by mouth daily.  . carvedilol (COREG) 25 MG tablet TAKE 1 AND 1/2 TABLETS BY  MOUTH TWO TIMES DAILY  . cholecalciferol (VITAMIN D) 1000 units tablet Take 1,000 Units by mouth daily.  . Chromium 1000 MCG TABS Take by mouth daily.  . Cinnamon 500 MG capsule Take 500 mg by mouth daily.  . fluticasone (FLONASE) 50 MCG/ACT nasal spray Place 1 spray into both nostrils daily. (Patient taking differently: Place 1 spray into both nostrils as needed. )  . hydrALAZINE (APRESOLINE) 50 MG tablet TAKE 1 TABLET BY MOUTH TWO  TIMES DAILY  . insulin NPH-regular Human (70-30) 100 UNIT/ML injection  Inject 20-60 Units into the skin See admin instructions. 50 units at bedtime (Patient taking differently: Inject 30 Units into the skin at bedtime. )  . Magnesium Hydroxide (MAGNESIA PO) Take 1,350 mg by mouth daily.  [name] nystatin (NYSTATIN) powder Apply topically 3 (three) times daily as needed. As needed  . Omega-3 Fatty Acids (FISH OIL) 1000 MG CAPS Take 1,000 mg by mouth daily.   . pravastatin (PRAVACHOL) 40 MG tablet TAKE 1 TABLET BY MOUTH  EVERY DAY  . spironolactone (ALDACTONE) 25 MG tablet Take 1 tablet (25 mg total) by mouth every morning.  . TRULICITY 1.5 LP/3.7TK SOPN INJECT 1.5 MG INTO THE SKIN ONCE A WEEK.  . valsartan-hydrochlorothiazide (DIOVAN-HCT) 160-25 MG tablet Take 1 tablet by mouth daily.   No facility-administered encounter medications on file as of 02/02/2019.      Objective:   Goals Addressed            This Visit's Progress     Patient Stated   . I would like to optimize my medication management of my chronic conditions (pt-stated)       Current Barriers:  [name] Knowledge Deficits related to disease state management, Non Adherence to prescribed medication regimen, and Financial Barriers  Pharmacist Clinical Goal(s):  [name] Over the next 90 days, patient will work with CCM PharmD & PCP to address needs related to optimized medication management of chronic conditions  PharmD Interventions: Call completed with patient on 02/02/19 . Comprehensive medication review performed.  Reviewed medication fill history  via insurance claims data confirming patient appears compliant with having his medications filled on time as prescribed by provider. . Reviewed & discussed the following diabetes-related information with patient: o Continue checking blood sugars as directed o Follow ADA recommended "diabetes-friendly" diet  (reviewed healthy snack/food options) o Confirmed current DM regimen: Humalog 70/30 KwikPen 30 units qHS, once weekly Trulicity (phone number given to patient to  call in refills to [employer] patient assistance program) - Patient wishes to stay on this regimen, however we will continue to discuss potentially switching 70/30 mix to Basaglar vs. Tyler Aas for optimal coverage  o Discussed insulin/GLP-1 injection technique; Patient uses AccuCheck Aviva glucometer o Reviewed medication purpose/side effects-->patient denies adverse events, denies hypoglycemia, reports FBG range from 90-120s (<130).  In the evening, her blood glucose ranges from 150-170 (depends on diet) o Most recent A1c is 6.7 (5.28.20) slightly up from 6.4 (05/2018).  Will continue to follow as medication assistance will allow patient to stay on track with A1c. o Patient continues to take and fill statin and medications for hypertension.  Pravastatin last filled 12/28/18 for #90 DS. Tolerating well. Denies side effects. o Continue taking all medications as prescribed by provider   Patient Self Care Activities:  . Self administers medications as prescribed . Attends all scheduled provider appointments . Calls pharmacy for medication refills . Performs ADL's independently . Calls provider office for new concerns or questions  Please see past updates related to this goal by clicking on the "Past Updates" button in the selected goal            Plan:   The care management team will reach out to the patient again over the next 6-8 weeks.  Regina Eck, PharmD, BCPS Clinical Pharmacist, [facility] [place]: [phone]

## 2019-02-07 ENCOUNTER — Telehealth: Payer: Self-pay

## 2019-02-10 ENCOUNTER — Ambulatory Visit: Payer: Self-pay | Admitting: Pharmacist

## 2019-02-10 ENCOUNTER — Encounter: Payer: Self-pay | Admitting: Internal Medicine

## 2019-02-10 ENCOUNTER — Other Ambulatory Visit: Payer: Self-pay

## 2019-02-10 ENCOUNTER — Ambulatory Visit (INDEPENDENT_AMBULATORY_CARE_PROVIDER_SITE_OTHER): Payer: Medicare Other | Admitting: Internal Medicine

## 2019-02-10 VITALS — BP 130/62 | HR 72 | Temp 97.7°F | Ht 63.0 in | Wt 249.8 lb

## 2019-02-10 DIAGNOSIS — N1832 Chronic kidney disease, stage 3b: Secondary | ICD-10-CM

## 2019-02-10 DIAGNOSIS — E1122 Type 2 diabetes mellitus with diabetic chronic kidney disease: Secondary | ICD-10-CM

## 2019-02-10 DIAGNOSIS — I130 Hypertensive heart and chronic kidney disease with heart failure and stage 1 through stage 4 chronic kidney disease, or unspecified chronic kidney disease: Secondary | ICD-10-CM | POA: Diagnosis not present

## 2019-02-10 DIAGNOSIS — Z23 Encounter for immunization: Secondary | ICD-10-CM

## 2019-02-10 DIAGNOSIS — Z794 Long term (current) use of insulin: Secondary | ICD-10-CM | POA: Diagnosis not present

## 2019-02-10 DIAGNOSIS — Z6841 Body Mass Index (BMI) 40.0 and over, adult: Secondary | ICD-10-CM

## 2019-02-10 DIAGNOSIS — E1121 Type 2 diabetes mellitus with diabetic nephropathy: Secondary | ICD-10-CM | POA: Diagnosis not present

## 2019-02-10 DIAGNOSIS — I5042 Chronic combined systolic (congestive) and diastolic (congestive) heart failure: Secondary | ICD-10-CM | POA: Diagnosis not present

## 2019-02-10 LAB — HEMOGLOBIN A1C
Est. average glucose Bld gHb Est-mCnc: 154 mg/dL
Hgb A1c MFr Bld: 7 % — ABNORMAL HIGH (ref 4.8–5.6)

## 2019-02-10 NOTE — Patient Instructions (Signed)
Preventing Influenza, Adult Influenza, more commonly known as "the flu," is a viral infection that mainly affects the respiratory tract. The respiratory tract includes structures that help you breathe, such as the lungs, nose, and throat. The flu causes many common cold symptoms, as well as a high fever and body aches. The flu spreads easily from person to person (is contagious). The flu is most common from December through March. This is called flu season.You can catch the flu virus by:  Breathing in droplets from an infected person's cough or sneeze.  Touching something that was recently contaminated with the virus and then touching your mouth, nose, or eyes. What can I do to lower my risk?        You can decrease your risk of getting the flu by:  Getting a flu shot (influenza vaccination) every year. This is the best way to prevent the flu. A flu shot is recommended for everyone age 6 months and older. ? It is best to get a flu shot in the fall, as soon as it is available. Getting a flu shot during winter or spring instead is still a good idea. Flu season can last into early spring. ? Preventing the flu through vaccination requires getting a new flu shot every year. This is because the flu virus changes slightly (mutates) from one year to the next. Even if a flu shot does not completely protect you from all flu virus mutations, it can reduce the severity of your illness and prevent dangerous complications of the flu. ? If you are pregnant, you can and should get a flu shot. ? If you have had a reaction to the shot in the past or if you are allergic to eggs, check with your health care provider before getting a flu shot. ? Sometimes the vaccine is available as a nasal spray. In some years, the nasal spray has not been as effective against the flu virus. Check with your health care provider if you have questions about this.  Practicing good health habits. This is especially important during  flu season. ? Avoid contact with people who are sick with flu or cold symptoms. ? Wash your hands with soap and water often. If soap and water are not available, use alcohol-based hand sanitizer. ? Avoid touching your hands to your face, especially when you have not washed your hands recently. ? Use a disinfectant to clean surfaces at home and at work that may be contaminated with the flu virus. ? Keep your body's disease-fighting system (immune system) in good shape by eating a healthy diet, drinking plenty of fluids, getting enough sleep, and exercising regularly. If you do get the flu, avoid spreading it to others by:  Staying home until your symptoms have been gone for at least one day.  Covering your mouth and nose when you cough or sneeze.  Avoiding close contact with others, especially babies and elderly people. Why are these changes important? Getting a flu shot and practicing good health habits protects you as well as other people. If you get the flu, your friends, family, and co-workers are also at risk of getting it, because it spreads so easily to others. Each year, about 2 out of every 10 people get the flu. Having the flu can lead to complications, such as pneumonia, ear infection, and sinus infection. The flu also can be deadly, especially for babies, people older than age 65, and people who have serious long-term diseases. How is this treated? Most   people recover from the flu by resting at home and drinking plenty of fluids. However, a prescription antiviral medicine may reduce your flu symptoms and may make your flu go away sooner. This medicine must be started within a few days of getting flu symptoms. You can talk with your health care provider about whether you need an antiviral medicine. Antiviral medicine may be prescribed for people who are at risk for more serious flu symptoms. This includes people who:  Are older than age 16.  Are pregnant.  Have a condition that  makes the flu worse or more dangerous. Where to find more information  Centers for Disease Control and Prevention: http://www.smith-bell.org/  [facility].[employer].ee: azureicus.com  American Academy of Family Physicians: familydoctor.org/familydoctor/en/kids/vaccines/preventing-the-flu.html Contact a health care provider if:  You have influenza and you develop new symptoms.  You have: ? Chest pain. ? Diarrhea. ? A fever.  Your cough gets worse, or you produce more mucus. Summary  The best way to prevent the flu is to get a flu shot every year in the fall.  Even if you get the flu after you have received the yearly vaccine, your flu may be milder and go away sooner because of your flu shot.  If you get the flu, antiviral medicines that are started with a few days of symptoms may reduce your flu symptoms and may make your flu go away sooner.  You can also help prevent the flu by practicing good health habits. This information is not intended to replace advice given to you by your health care provider. Make sure you discuss any questions you have with your health care provider. Document Released: 05/06/2015 Document Revised: 04/03/2017 Document Reviewed: 12/29/2015 Elsevier Patient Education  2020 [employer].

## 2019-02-10 NOTE — Progress Notes (Signed)
Subjective:     Patient ID: Yvonne Miller , female    DOB: 1950-08-29 , 68 y.o.   MRN: [id number]   Chief Complaint  Patient presents with  . Diabetes  . Hypertension    HPI  Diabetes She presents for her follow-up diabetic visit. She has type 2 diabetes mellitus. There are no hypoglycemic associated symptoms. Pertinent negatives for diabetes include no blurred vision and no chest pain. There are no hypoglycemic complications. Risk factors for coronary artery disease include diabetes mellitus, dyslipidemia, hypertension, post-menopausal, sedentary lifestyle and obesity. Current diabetic treatment includes insulin injections. She is compliant with treatment some of the time. She is following a diabetic diet. She participates in exercise intermittently.  Hypertension This is a chronic problem. The current episode started more than 1 year ago. The problem has been gradually improving since onset. The problem is controlled. Pertinent negatives include no blurred vision, chest pain, palpitations or shortness of breath. The current treatment provides moderate improvement. Compliance problems include exercise.  Hypertensive end-organ damage includes kidney disease and heart failure.     Past Medical History:  Diagnosis Date  . AICD (automatic cardioverter/defibrillator) present   . Arthritis   . Asthma   . Carpal tunnel syndrome, bilateral   . CKD (chronic kidney disease)   . Diabetes mellitus   . GERD (gastroesophageal reflux disease)   . Hyperlipemia   . Hypertension   . Obesity    morbid  . Sleep apnea    wears CPAP set at 12  . Wears glasses      Family History  Problem Relation Age of Onset  . Hypertension Mother   . Stroke Mother   . Heart disease Father   . Heart attack Sister   . Heart attack Brother      Current Outpatient Medications:  .  ACCU-CHEK AVIVA PLUS test strip, CHECK 4 TIMES BY INTRADERMAL ROUTE EVERY DAY, Disp: , Rfl:  .  albuterol (PROVENTIL  HFA;VENTOLIN HFA) 108 (90 Base) MCG/ACT inhaler, Inhale 1 puff into the lungs every 4 (four) hours as needed for wheezing or shortness of breath., Disp: , Rfl:  .  amLODipine (NORVASC) 5 MG tablet, TAKE 1 TABLET BY MOUTH  DAILY, Disp: 90 tablet, Rfl: 3 .  aspirin EC 81 MG tablet, Take 81 mg by mouth daily., Disp: , Rfl:  .  B Complex Vitamins (B COMPLEX-B12) TABS, Take 1 tablet by mouth daily., Disp: , Rfl:  .  carvedilol (COREG) 25 MG tablet, TAKE 1 AND 1/2 TABLETS BY  MOUTH TWO TIMES DAILY, Disp: 270 tablet, Rfl: 3 .  cholecalciferol (VITAMIN D) 1000 units tablet, Take 1,000 Units by mouth daily., Disp: , Rfl:  .  Chromium 1000 MCG TABS, Take by mouth daily., Disp: , Rfl:  .  Cinnamon 500 MG capsule, Take 500 mg by mouth daily., Disp: , Rfl:  .  fluticasone (FLONASE) 50 MCG/ACT nasal spray, Place 1 spray into both nostrils daily. (Patient taking differently: Place 1 spray into both nostrils as needed. ), Disp: 16 g, Rfl: 2 .  hydrALAZINE (APRESOLINE) 50 MG tablet, TAKE 1 TABLET BY MOUTH TWO  TIMES DAILY, Disp: 180 tablet, Rfl: 2 .  insulin NPH-regular Human (70-30) 100 UNIT/ML injection, Inject 20-60 Units into the skin See admin instructions. 50 units at bedtime (Patient taking differently: Inject 30 Units into the skin at bedtime. ), Disp: 10 mL, Rfl: 5 .  Magnesium Hydroxide (MAGNESIA PO), Take 1,350 mg by mouth daily., Disp: , Rfl:  .  nystatin (NYSTATIN) powder, Apply topically 3 (three) times daily as needed. As needed, Disp: 60 g, Rfl: 2 .  Omega-3 Fatty Acids (FISH OIL) 1000 MG CAPS, Take 1,000 mg by mouth daily. , Disp: , Rfl:  .  pravastatin (PRAVACHOL) 40 MG tablet, TAKE 1 TABLET BY MOUTH  EVERY DAY, Disp: 90 tablet, Rfl: 2 .  spironolactone (ALDACTONE) 25 MG tablet, Take 1 tablet (25 mg total) by mouth every morning., Disp: 90 tablet, Rfl: 3 .  TRULICITY 1.5 FK/8.1EX SOPN, INJECT 1.5 MG INTO THE SKIN ONCE A WEEK., Disp: 2 pen, Rfl: 2 .  valsartan-hydrochlorothiazide (DIOVAN-HCT) 160-25  MG tablet, Take 1 tablet by mouth daily., Disp: 90 tablet, Rfl: 2   Allergies  Allergen Reactions  . Other Other (See Comments)    NO BLOOD   . Jehovah witness     Review of Systems  Constitutional: Negative.   Eyes: Negative for blurred vision.  Respiratory: Negative.  Negative for shortness of breath.   Cardiovascular: Negative.  Negative for chest pain and palpitations.  Gastrointestinal: Negative.   Neurological: Negative.   Psychiatric/Behavioral: Negative.      Today's Vitals   02/10/19 0843  BP: 130/62  Pulse: 72  Temp: 97.7 F (36.5 C)  TempSrc: Oral  SpO2: 97%  Weight: 249 lb 12.8 oz (113.3 kg)  Height: 5\' 3"  (1.6 m)   Body mass index is 44.25 kg/m.   Objective:  Physical Exam Vitals signs and nursing note reviewed.  Constitutional:      Appearance: Normal appearance.  HENT:     Head: Normocephalic and atraumatic.  Cardiovascular:     Rate and Rhythm: Normal rate and regular rhythm.     Heart sounds: Normal heart sounds.  Pulmonary:     Effort: Pulmonary effort is normal.     Breath sounds: Normal breath sounds.  Musculoskeletal:     Right lower leg: 2+ Edema present.  Skin:    General: Skin is warm.  Neurological:     General: No focal deficit present.     Mental Status: She is alert.  Psychiatric:        Mood and Affect: Mood normal.        Behavior: Behavior normal.         Assessment And Plan:     1. Type 2 diabetes mellitus with stage 3b chronic kidney disease, with long-term current use of insulin (HCC)  I will check a BMP and hba1c today.  She was advised of ten pound weight gain and encouraged to cut back on her portion sizes and sugary beverages. She is also encouraged to resume her regular exercise regimen.   2. Hypertensive heart and renal disease with renal failure, stage 1 through stage 4 or unspecified chronic kidney disease, with heart failure (HCC)  Chronic, fair control. She will continue with current medications. Importance  of following a low-sodium diet was discussed with the patient.   3. Chronic combined systolic and diastolic congestive heart failure (HCC)  Chronic, yet stable. She is encouraged to limit her salt intake. She is advised to increase her daily activity. She does have RLE edema - this is chronic, due to lymphedema.   4. Need for influenza vaccination  - Flu vaccine HIGH DOSE PF (Fluzone High dose)    5. Class 3 severe obesity due to excess calories with serious comorbidity and body mass index (BMI) of 40.0 to 44.9 in adult [facility])  Importance of achieving optimal weight to decrease risk of cardiovascular  disease and cancers was discussed with the patient in full detail. Importance of regular exercise was discussed with the patient.  She is encouraged to start slowly - start with 10 minutes twice daily at least three to four days per week and to gradually build to 30 minutes five days weekly. She was given tips to incorporate more activity into her daily routine - take stairs when possible, park farther away from grocery stores, etc.      Maximino Greenland, MD    THE PATIENT IS ENCOURAGED TO PRACTICE SOCIAL DISTANCING DUE TO THE COVID-19 PANDEMIC.

## 2019-02-14 ENCOUNTER — Ambulatory Visit: Payer: Self-pay | Admitting: Pharmacist

## 2019-02-14 DIAGNOSIS — E1122 Type 2 diabetes mellitus with diabetic chronic kidney disease: Secondary | ICD-10-CM

## 2019-02-14 DIAGNOSIS — Z794 Long term (current) use of insulin: Secondary | ICD-10-CM

## 2019-02-14 DIAGNOSIS — I130 Hypertensive heart and chronic kidney disease with heart failure and stage 1 through stage 4 chronic kidney disease, or unspecified chronic kidney disease: Secondary | ICD-10-CM

## 2019-02-14 NOTE — Patient Instructions (Addendum)
Visit Information  Goals Addressed            This Visit's Progress     Patient Stated   . I can't afford my new eye drops for my glaucoma (pt-stated)       Current Barriers:  . Financial Barriers: patient has [facility] [employer] and reports copay for [name] is cost prohibitive at this time .   Pharmacist Clinical Goal(s):  [name] Over the next 30 days, patient will work with PharmD and providers to relieve medication access concerns  Interventions: . Comprehensive medication review completed; medication list updated in electronic medical record.  [name] by [employer]:  Application process is complicated.  Will discuss with patient to see if she is able to fulfill the requests. . Patient was prescribed generic Xalatan for now as she is able to afford generic copay  Patient Self Care Activities:  . Patient will provide necessary portions of application   Initial goal documentation     . I would like to manage my chronic health conditions (pt-stated)       Current Barriers:  [name] Knowledge Deficits related to management of chronic conditions  Pharmacist Clinical Goal(s):  [name] Over the next 90 days, patient will work with CCM team & PCP to address needs related to optimized medication management of chronic conditions  Interventions: . Comprehensive medication review performed.  Reviewed medication fill history via insurance claims data confirming patient appears compliant with having her medications filled on time as prescribed by provider. . Reviewed & discussed the following diabetes-related information with patient: o Continue checking blood sugars as directed o Follow ADA recommended "diabetes-friendly" diet  (reviewed healthy snack/food options) o Confirmed current DM regimen: Trulicity 1.5 mg weekly, 70/30 insulin-30 units qHS o Discussed insulin/GLP-1 injection technique; Patient uses AccuCheck Aviva glucometer. o Reviewed medication purpose/side effects-->patient  denies adverse events, denies hypoglycemia, reports FBG 90-120s, at night after dinner it maxes out at 150-180, she then injects her insulin at bedtime. o Most recent A1c is 7.0% on 02/10/19 (last A1c was 6.7% on 09/30/18)--patient states she has been unable to participate in the [facility] water aerobics classes due to [place].  She is trying to get motivated again. o Patient reports taking aspirin and statin (claims data reports potential compliance issues with statin, will explore).  Pravastatin filled 12/28/18 for #90 o Continue taking all medications as prescribed by provider . For blood pressure-->reviewed medication regimen: carvedilol, hydralazine, spironolactone (recently reduced to 25mg  per cards due to hyperkalemia, patient has not filled valsartan/HCTZ recently (will follow up at next visit);  Recent EF improved to 55%. . Will continue to follow.  Patient Self Care Activities:  . Self administers medications as prescribed . Attends all scheduled provider appointments . Calls pharmacy for medication refills . Performs ADL's independently . Performs IADL's independently . Calls provider office for new concerns or questions  Please see past updates related to this goal by clicking on the "Past Updates" button in the selected goal         The patient verbalized understanding of instructions provided today and declined a print copy of patient instruction materials.   The care management team will reach out to the patient again over the next 7 days.   Regina Eck, PharmD, BCPS Clinical Pharmacist, [facility] [place]: [phone]

## 2019-02-14 NOTE — Progress Notes (Signed)
Chronic Care Management   Visit Note  02/10/2019 Name: Yvonne Miller MRN: [id number] DOB: Dec 25, 1950  Referred by: Yvonne Chard, MD Reason for referral : Chronic Care Management   Yvonne Miller is a 68 y.o. year old female who is a primary care patient of Yvonne Chard, MD. The CCM team was consulted for assistance with chronic disease management and care coordination needs related to DMII and glaucoma  Review of patient status, including review of consultants reports, relevant laboratory and other test results, and collaboration with appropriate care team members and the patient's provider was performed as part of comprehensive patient evaluation and provision of chronic care management services.    I spoke with Yvonne Miller by telephone today.  Advanced Directives Status: N See Care Plan and Vynca application for related entries.   Medications: Outpatient Encounter Medications as of 02/10/2019  Medication Sig  . ACCU-CHEK AVIVA PLUS test strip CHECK 4 TIMES BY INTRADERMAL ROUTE EVERY DAY  . albuterol (PROVENTIL HFA;VENTOLIN HFA) 108 (90 Base) MCG/ACT inhaler Inhale 1 puff into the lungs every 4 (four) hours as needed for wheezing or shortness of breath.  [name] amLODipine (NORVASC) 5 MG tablet TAKE 1 TABLET BY MOUTH  DAILY  . aspirin EC 81 MG tablet Take 81 mg by mouth daily.  . B Complex Vitamins (B COMPLEX-B12) TABS Take 1 tablet by mouth daily.  . carvedilol (COREG) 25 MG tablet TAKE 1 AND 1/2 TABLETS BY  MOUTH TWO TIMES DAILY  . cholecalciferol (VITAMIN D) 1000 units tablet Take 1,000 Units by mouth daily.  . Chromium 1000 MCG TABS Take by mouth daily.  . Cinnamon 500 MG capsule Take 500 mg by mouth daily.  . fluticasone (FLONASE) 50 MCG/ACT nasal spray Place 1 spray into both nostrils daily. (Patient taking differently: Place 1 spray into both nostrils as needed. )  . hydrALAZINE (APRESOLINE) 50 MG tablet TAKE 1 TABLET BY MOUTH TWO  TIMES DAILY  . insulin NPH-regular Human (70-30)  100 UNIT/ML injection Inject 20-60 Units into the skin See admin instructions. 50 units at bedtime (Patient taking differently: Inject 30 Units into the skin at bedtime. )  . latanoprost (XALATAN) 0.005 % ophthalmic solution Place 1 drop into the right eye at bedtime.  . Magnesium Hydroxide (MAGNESIA PO) Take 1,350 mg by mouth daily.  [name] nystatin (NYSTATIN) powder Apply topically 3 (three) times daily as needed. As needed  . Omega-3 Fatty Acids (FISH OIL) 1000 MG CAPS Take 1,000 mg by mouth daily.   . pravastatin (PRAVACHOL) 40 MG tablet TAKE 1 TABLET BY MOUTH  EVERY DAY  . spironolactone (ALDACTONE) 25 MG tablet Take 1 tablet (25 mg total) by mouth every morning.  . TRULICITY 1.5 UY/4.0HK SOPN INJECT 1.5 MG INTO THE SKIN ONCE A WEEK.  . valsartan-hydrochlorothiazide (DIOVAN-HCT) 160-25 MG tablet Take 1 tablet by mouth daily.   No facility-administered encounter medications on file as of 02/10/2019.      Objective:   Goals Addressed            This Visit's Progress     Patient Stated   . I can't afford my new eye drops for my glaucoma (pt-stated)       Current Barriers:  . Financial Barriers: patient has [facility] [employer] and reports copay for [name] is cost prohibitive at this time .   Pharmacist Clinical Goal(s):  [name] Over the next 30 days, patient will work with PharmD and providers to relieve medication access concerns  Interventions: .  Comprehensive medication review completed; medication list updated in electronic medical record.  [name] by [employer]:  Application process is complicated.  Will discuss with patient to see if she is able to fulfill the requests. . Patient was prescribed generic Xalatan for now as she is able to afford generic copay  Patient Self Care Activities:  . Patient will provide necessary portions of application   Initial goal documentation     . I would like to manage my chronic health conditions (pt-stated)       Current Barriers:  [name]  Knowledge Deficits related to management of chronic conditions  Pharmacist Clinical Goal(s):  [name] Over the next 90 days, patient will work with CCM team & PCP to address needs related to optimized medication management of chronic conditions  Interventions: . Comprehensive medication review performed.  Reviewed medication fill history via insurance claims data confirming patient appears compliant with having her medications filled on time as prescribed by provider. . Reviewed & discussed the following diabetes-related information with patient: o Continue checking blood sugars as directed o Follow ADA recommended "diabetes-friendly" diet  (reviewed healthy snack/food options) o Confirmed current DM regimen: Trulicity 1.5 mg weekly, 70/30 insulin-30 units qHS o Discussed insulin/GLP-1 injection technique; Patient uses AccuCheck Aviva glucometer. o Reviewed medication purpose/side effects-->patient denies adverse events, denies hypoglycemia, reports FBG 90-120s, at night after dinner it maxes out at 150-180, she then injects her insulin at bedtime. o Most recent A1c is 7.0% on 02/10/19 (last A1c was 6.7% on 09/30/18)--patient states she has been unable to participate in the [facility] water aerobics classes due to [place].  She is trying to get motivated again. o Patient reports taking aspirin and statin (claims data reports potential compliance issues with statin, will explore).  Pravastatin filled 12/28/18 for #90 o Continue taking all medications as prescribed by provider . For blood pressure-->reviewed medication regimen: carvedilol, hydralazine, spironolactone (recently reduced to 25mg  per cards due to hyperkalemia, patient has not filled valsartan/HCTZ recently (will follow up at next visit);  Recent EF improved to 55%. . Will continue to follow.  Patient Self Care Activities:  . Self administers medications as prescribed . Attends all scheduled provider appointments . Calls pharmacy for medication  refills . Performs ADL's independently . Performs IADL's independently . Calls provider office for new concerns or questions  Please see past updates related to this goal by clicking on the "Past Updates" button in the selected goal          Plan:   The care management team will reach out to the patient again over the next 7 days.  Regina Eck, PharmD, BCPS Clinical Pharmacist, [facility] [place]: [phone]

## 2019-02-15 ENCOUNTER — Encounter: Payer: Self-pay | Admitting: Internal Medicine

## 2019-02-18 ENCOUNTER — Encounter: Payer: Self-pay | Admitting: Cardiology

## 2019-02-18 ENCOUNTER — Other Ambulatory Visit: Payer: Self-pay

## 2019-02-18 ENCOUNTER — Ambulatory Visit (INDEPENDENT_AMBULATORY_CARE_PROVIDER_SITE_OTHER): Payer: Medicare Other | Admitting: Cardiology

## 2019-02-18 VITALS — BP 124/64 | HR 72 | Ht 63.0 in | Wt 251.0 lb

## 2019-02-18 DIAGNOSIS — Z95810 Presence of automatic (implantable) cardiac defibrillator: Secondary | ICD-10-CM | POA: Diagnosis not present

## 2019-02-18 DIAGNOSIS — I10 Essential (primary) hypertension: Secondary | ICD-10-CM

## 2019-02-18 DIAGNOSIS — Z4502 Encounter for adjustment and management of automatic implantable cardiac defibrillator: Secondary | ICD-10-CM

## 2019-02-18 DIAGNOSIS — I5042 Chronic combined systolic (congestive) and diastolic (congestive) heart failure: Secondary | ICD-10-CM

## 2019-02-18 DIAGNOSIS — I428 Other cardiomyopathies: Secondary | ICD-10-CM | POA: Insufficient documentation

## 2019-02-18 NOTE — Progress Notes (Signed)
Primary Physician/Referring:  [name], MD  Patient ID: Yvonne Miller, female    DOB: [birth year], 68 y.o.   MRN: [id number]  Chief Complaint  Patient presents with  . Congestive Heart Failure  . Hypertension  . Follow-up  . Pacemaker Check    HPI: Yvonne Miller  is a 68 y.o. 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 She also has morbid obesity, OSA on CPAP and , hypertension, DM with stage 3a CKD, hyperlipidemia, degenerative joint disease and diabetes mellitus.   I seen her 3 months ago on a virtual visit, in view of congestive heart failure and complex medical history of artery in for device check and also for office visit.  On her previous visit I had reduce the dose of spironolactone from 50 mg to 25 mg in view of hyperkalemia.  She is presently doing well and denies any dyspnea except for mild baseline dyspnea.  No PND or orthopnea.  No leg edema.  Past Medical History:  Diagnosis Date  . AICD (automatic cardioverter/defibrillator) present   . Arthritis   . Asthma   . Carpal tunnel syndrome, bilateral   . CKD (chronic kidney disease)   . Diabetes mellitus   . Encounter for assessment of implantable cardioverter-defibrillator (ICD)   . GERD (gastroesophageal reflux disease)   . Hyperlipemia   . Hypertension   . Obesity    morbid  . Sleep apnea    wears CPAP set at 12  . Wears glasses     Past Surgical History:  Procedure Laterality Date  . ABDOMINAL HYSTERECTOMY    . BREAST SURGERY  1968   breast mass excision  . CARPAL TUNNEL RELEASE Left 07/10/2017   Procedure: CARPAL TUNNEL RELEASE;  Surgeon: Ashok Pall, MD;  Location: [place];  Service: Neurosurgery;  Laterality: Left;  left  . CARPAL TUNNEL RELEASE Right 03/19/2018   Procedure: RIGHT CARPAL TUNNEL RELEASE;  Surgeon: Ashok Pall, MD;  Location: [place];  Service: Neurosurgery;  Laterality: Right;  right  . CERVICAL SPINE SURGERY  2006  . CHOLECYSTECTOMY    . DILATION AND CURETTAGE OF UTERUS    . ICD IMPLANT    . JOINT REPLACEMENT    . TONSILLECTOMY  1961  . TOTAL KNEE ARTHROPLASTY Left 01/26/2018   Procedure: LEFT TOTAL KNEE ARTHROPLASTY;  Surgeon: Mcarthur Rossetti, MD;  Location: [place];  Service: Orthopedics;  Laterality: Left;    Social History   Socioeconomic History  . Marital status: Married    Spouse name: Not on file  . Number of children: 2  . Years of education: Not on file  . Highest education level: Not on file  Occupational History  . Occupation: retired  Scientific laboratory technician  . Financial resource strain: Not hard at all  . Food insecurity    Worry: Never true    Inability: Never true  . Transportation needs    Medical: No    Non-medical: No  Tobacco Use  . Smoking status: Never Smoker  . Smokeless tobacco: Never Used  Substance and Sexual Activity  . Alcohol use: No  . Drug use: No  . Sexual activity: Yes  Lifestyle  . Physical activity    Days per week: 3 days    Minutes per session: 10 min  . Stress: Not at all  Relationships  . Social connections  Talks on phone: Not on file    Gets together: Not on file    Attends religious service: Not on file    Active member of club or organization: Not on file    Attends meetings of clubs or organizations: Not on file    Relationship status: Not on file  . Intimate partner violence    Fear of current or ex partner: No    Emotionally abused: No    Physically abused: No    Forced sexual activity: No  Other Topics Concern  . Not on file  Social History Narrative  . Not on file   Review of Systems  Constitution: Negative for chills, decreased appetite, malaise/fatigue and weight gain.  Cardiovascular: Positive for leg swelling (lymphedema left leg and wears support stocking.  ). Negative for dyspnea on exertion and syncope.  Endocrine: Negative for  cold intolerance.  Hematologic/Lymphatic: Does not bruise/bleed easily.  Musculoskeletal: Positive for arthritis (bilateral knee). Negative for joint swelling.  Gastrointestinal: Negative for abdominal pain, anorexia, change in bowel habit, hematochezia and melena.  Neurological: Negative for headaches and light-headedness.  Psychiatric/Behavioral: Negative for depression and substance abuse.  All other systems reviewed and are negative.     Objective   Vitals with BMI 02/18/2019 02/10/2019 10/21/2018  Height 5\' 3"  5\' 3"  5\' 4"   Weight 251 lbs 249 lbs 13 oz 237 lbs  BMI 44.47 50.09 38.18  Systolic 299 371 696  Diastolic 64 62 70  Pulse 72 72 -    Physical Exam  Constitutional: She appears well-developed. No distress.  Morbidly obese  HENT:  Head: Atraumatic.  Eyes: Conjunctivae are normal.  Neck: Neck supple. No thyromegaly present.  Short neck and difficult to evaluate JVP  Cardiovascular: Normal rate, regular rhythm, normal heart sounds, intact distal pulses and normal pulses. Exam reveals no gallop.  No murmur heard. Pulses:      Carotid pulses are 2+ on the right side and 2+ on the left side. Femoral and popliteal pulse difficult to feel due to patient's body habitus.   Edema: Non pitting left leg below knee present. Trace right leg edema.   Pulmonary/Chest: Effort normal and breath sounds normal.  Abdominal: Soft. Bowel sounds are normal.  Obese. Pannus present  Musculoskeletal: Normal range of motion.        General: No edema.  Neurological: She is alert.  Skin: Skin is warm and dry.  Psychiatric: She has a normal mood and affect.   Radiology: No results found.  Laboratory examination:    CMP Latest Ref Rng & Units 09/30/2018 05/31/2018 03/12/2018  Glucose 65 - 99 mg/dL 114(H) 67 83  BUN 8 - 27 mg/dL 36(H) 33(H) 25(H)  Creatinine 0.57 - 1.00 mg/dL 1.76(H) 1.46(H) 1.58(H)  Sodium 134 - 144 mmol/L 138 138 137  Potassium 3.5 - 5.2 mmol/L 5.5(H) 5.3(H) 4.3  Chloride  96 - 106 mmol/L 101 99 106  CO2 20 - 29 mmol/L 21 22 23   Calcium 8.7 - 10.3 mg/dL 9.9 9.8 9.9  Total Protein 6.0 - 8.5 g/dL 7.4 - -  Total Bilirubin 0.0 - 1.2 mg/dL 0.2 - -  Alkaline Phos 39 - 117 IU/L 99 - -  AST 0 - 40 IU/L 12 - -  ALT 0 - 32 IU/L 16 - -   CBC Latest Ref Rng & Units 09/30/2018 04/14/2018 03/12/2018  WBC 3.4 - 10.8 x10E3/uL 9.3 10.3 11.3(H)  Hemoglobin 11.1 - 15.9 g/dL 12.0 10.7(L) 10.6(L)  Hematocrit 34.0 - 46.6 %  36.0 32.9(L) 35.8(L)  Platelets 150 - 450 x10E3/uL 367 384 397   Lipid Panel     Component Value Date/Time   CHOL 146 09/30/2018 1303   TRIG 143 09/30/2018 1303   HDL 42 09/30/2018 1303   CHOLHDL 3.5 09/30/2018 1303   CHOLHDL 3.8 04/22/2009 0603   VLDL 45 (H) 04/22/2009 0603   LDLCALC 75 09/30/2018 1303   HEMOGLOBIN A1C Lab Results  Component Value Date   HGBA1C 7.0 (H) 02/10/2019   MPG 165.68 01/15/2018   TSH No results for input(s): TSH in the last 8760 hours.  Medications   There are no discontinued medications. Current Meds  Medication Sig  . ACCU-CHEK AVIVA PLUS test strip CHECK 4 TIMES BY INTRADERMAL ROUTE EVERY DAY  . albuterol (PROVENTIL HFA;VENTOLIN HFA) 108 (90 Base) MCG/ACT inhaler Inhale 1 puff into the lungs every 4 (four) hours as needed for wheezing or shortness of breath.  [name] amLODipine (NORVASC) 5 MG tablet TAKE 1 TABLET BY MOUTH  DAILY  . aspirin EC 81 MG tablet Take 81 mg by mouth daily.  . B Complex Vitamins (B COMPLEX-B12) TABS Take 1 tablet by mouth daily.  . carvedilol (COREG) 25 MG tablet TAKE 1 AND 1/2 TABLETS BY  MOUTH TWO TIMES DAILY  . cholecalciferol (VITAMIN D) 1000 units tablet Take 1,000 Units by mouth daily.  . Chromium 1000 MCG TABS Take by mouth daily.  . Cinnamon 500 MG capsule Take 500 mg by mouth daily.  . fluticasone (FLONASE) 50 MCG/ACT nasal spray Place 1 spray into both nostrils daily. (Patient taking differently: Place 1 spray into both nostrils as needed. )  . hydrALAZINE (APRESOLINE) 50 MG tablet  TAKE 1 TABLET BY MOUTH TWO  TIMES DAILY  . insulin NPH-regular Human (70-30) 100 UNIT/ML injection Inject 20-60 Units into the skin See admin instructions. 50 units at bedtime (Patient taking differently: Inject 30 Units into the skin at bedtime. )  . latanoprost (XALATAN) 0.005 % ophthalmic solution Place 1 drop into the right eye at bedtime.  . Magnesium Hydroxide (MAGNESIA PO) Take 1,350 mg by mouth daily.  [name] nystatin (NYSTATIN) powder Apply topically 3 (three) times daily as needed. As needed  . Omega-3 Fatty Acids (FISH OIL) 1000 MG CAPS Take 1,000 mg by mouth daily.   . pravastatin (PRAVACHOL) 40 MG tablet TAKE 1 TABLET BY MOUTH  EVERY DAY  . spironolactone (ALDACTONE) 25 MG tablet Take 1 tablet (25 mg total) by mouth every morning.  . TRULICITY 1.5 XV/4.0GQ SOPN INJECT 1.5 MG INTO THE SKIN ONCE A WEEK.  . valsartan-hydrochlorothiazide (DIOVAN-HCT) 160-25 MG tablet Take 1 tablet by mouth daily.    Cardiac Studies:   Sleep study 2010 (sleep apnea-has a CPAP but doesn't use it every night).  Coronary Angiography  R+ L 03/04/11: and in 2010 Normal coronary arteries. Moderate pulmonary hypertension.  Echocardiogram [07/06/2014]:  1. Left ventricle cavity is minimally dilated. Mild concentric hypertrophy of the left ventricle. Normal global wall motion. Doppler evidence of grade I (impaired) diastolic dysfunction. Normal systolic function. Calculated EF 55%. 2. Left atrial cavity is mild to moderately dilated. 3. Trace mitral regurgitation. 4. Mild tricuspid regurgitation. No evidence of pulmonary hypertension. 5. c.f. echo. of 09/12/2013, there is remarkable improvement in LV syst. function, EF has improved from 28% to 55%.  Assessment     ICD-10-CM   1. Encounter for assessment of implantable cardioverter-defibrillator (ICD)  Z45.02   2. ICD -  Biventricular  [employer] Dynagen X4 CRT-D Model [phone]  ICD in place 10/28/13  Z95.810   3. Non-ischemic cardiomyopathy ([place])  I42.8    4. Chronic combined systolic and diastolic heart failure (HCC)  I50.42   5. Essential hypertension  I10     Scheduled In office ICD check 02/18/19  Not pacer dependent.  Need thresholds and impedance normal.  Daily activity 5 hours.  No atrial or ventricular high rate episodes.  No therapy.  AP<1%, BP 100%.  Longevity 5 years.  EKG 10/19/2017: Normal sinus rhythm at rate of 66 bpm, bi-ventricularly paced rhythm. No further analysis. No significant change from EKG 10/24/2016.  Recommendations:    Patient is on appropriate medical therapy including ARB and spironolactone combination along with beta-blocker carvedilol maximum dose.  No clinical evidence of heart on her last visit with me 3 months ago, I have reduced the dose of spironolactone from 50 mg to 25 mg due to hyperkalemia.  Blood pressure is also well controlled.  I reviewed her ICD, normal ICD function, she has responded excellently without further recurrence of CHF.  No changes in the medications were done today.  I will see him back in 6 months for follow-up unless she has issues I will see him back sooner.  In view of renal insufficiency and hyperkalemia, she needs to have repeat BMP. She will have it done on Monday 02/21/19 at her PCP office.  Adrian Prows, MD, [facility] 02/19/2019, 12:23 PM [facility]. [place] Pager: [phone] Office: [phone] If no answer Cell [phone]

## 2019-02-21 ENCOUNTER — Other Ambulatory Visit: Payer: Self-pay | Admitting: Internal Medicine

## 2019-02-21 ENCOUNTER — Other Ambulatory Visit: Payer: Medicare Other

## 2019-02-21 ENCOUNTER — Other Ambulatory Visit: Payer: Self-pay

## 2019-02-21 ENCOUNTER — Other Ambulatory Visit: Payer: Self-pay | Admitting: Cardiology

## 2019-02-21 DIAGNOSIS — N1832 Chronic kidney disease, stage 3b: Secondary | ICD-10-CM | POA: Diagnosis not present

## 2019-02-21 DIAGNOSIS — E1121 Type 2 diabetes mellitus with diabetic nephropathy: Secondary | ICD-10-CM | POA: Diagnosis not present

## 2019-02-21 DIAGNOSIS — Z794 Long term (current) use of insulin: Secondary | ICD-10-CM | POA: Diagnosis not present

## 2019-02-21 DIAGNOSIS — H401131 Primary open-angle glaucoma, bilateral, mild stage: Secondary | ICD-10-CM | POA: Diagnosis not present

## 2019-02-21 NOTE — Progress Notes (Signed)
Chronic Care Management   Visit Note  02/14/2019 Name: Yvonne Miller MRN: [id number] DOB: [birth year]  Referred by: [name], MD Reason for referral : Chronic Care Management   Yvonne Miller is a 68 y.o. year old female who is a primary care patient of [name], MD. The CCM team was consulted for assistance with chronic disease management and care coordination needs related to DMII  Review of patient status, including review of consultants reports, relevant laboratory and other test results, and collaboration with appropriate care team members and the patient's provider was performed as part of comprehensive patient evaluation and provision of chronic care management services.    I spoke with Yvonne Miller by telephone today.  Advanced Directives Status: N See Care Plan and Vynca application for related entries.   Medications: Outpatient Encounter Medications as of 02/14/2019  Medication Sig  . ACCU-CHEK AVIVA PLUS test strip CHECK 4 TIMES BY INTRADERMAL ROUTE EVERY DAY  . albuterol (PROVENTIL HFA;VENTOLIN HFA) 108 (90 Base) MCG/ACT inhaler Inhale 1 puff into the lungs every 4 (four) hours as needed for wheezing or shortness of breath.  [name] amLODipine (NORVASC) 5 MG tablet TAKE 1 TABLET BY MOUTH  DAILY  . aspirin EC 81 MG tablet Take 81 mg by mouth daily.  . B Complex Vitamins (B COMPLEX-B12) TABS Take 1 tablet by mouth daily.  . carvedilol (COREG) 25 MG tablet TAKE 1 AND 1/2 TABLETS BY  MOUTH TWO TIMES DAILY  . cholecalciferol (VITAMIN D) 1000 units tablet Take 1,000 Units by mouth daily.  . Chromium 1000 MCG TABS Take by mouth daily.  . Cinnamon 500 MG capsule Take 500 mg by mouth daily.  . fluticasone (FLONASE) 50 MCG/ACT nasal spray Place 1 spray into both nostrils daily. (Patient taking differently: Place 1 spray into both nostrils as needed. )  . hydrALAZINE (APRESOLINE) 50 MG tablet TAKE 1 TABLET BY MOUTH TWO  TIMES DAILY  . insulin NPH-regular Human (70-30) 100 UNIT/ML  injection Inject 20-60 Units into the skin See admin instructions. 50 units at bedtime (Patient taking differently: Inject 30 Units into the skin at bedtime. )  . latanoprost (XALATAN) 0.005 % ophthalmic solution Place 1 drop into the right eye at bedtime.  . Magnesium Hydroxide (MAGNESIA PO) Take 1,350 mg by mouth daily.  [name] nystatin (NYSTATIN) powder Apply topically 3 (three) times daily as needed. As needed  . Omega-3 Fatty Acids (FISH OIL) 1000 MG CAPS Take 1,000 mg by mouth daily.   . pravastatin (PRAVACHOL) 40 MG tablet TAKE 1 TABLET BY MOUTH  EVERY DAY  . spironolactone (ALDACTONE) 25 MG tablet Take 1 tablet (25 mg total) by mouth every morning.  . TRULICITY 1.5 GY/1.8HU SOPN INJECT 1.5 MG INTO THE SKIN ONCE A WEEK.  . valsartan-hydrochlorothiazide (DIOVAN-HCT) 160-25 MG tablet Take 1 tablet by mouth daily.   No facility-administered encounter medications on file as of 02/14/2019.      Objective:   Goals Addressed            This Visit's Progress     Patient Stated   . I can't afford my new eye drops for my glaucoma (pt-stated)       Current Barriers:  . Financial Barriers: patient has [facility] [employer] and reports copay for [name] is cost prohibitive at this time .   Pharmacist Clinical Goal(s):  [name] Over the next 30 days, patient will work with PharmD and providers to relieve medication access concerns  Interventions: . Comprehensive medication  review completed; medication list updated in electronic medical record.  [name] by [facility] (for glaucoma):  Application process is complicated.  Will discuss with patient to see if she is able to fulfill the requests.  Mailed application to patient and asked her to discuss with her eye doctor. . Patient was prescribed generic Xalatan for now as she is able to afford generic copay.  She is using in right eye only as directed by eye doctor to determine if this generic medication will work for her.  Patient Self Care  Activities:  . Patient will provide necessary portions of application   Please see past updates related to this goal by clicking on the "Past Updates" button in the selected goal          Plan:   The care management team will reach out to the patient again over the next 6-8 weeks.  Regina Eck, PharmD, BCPS Clinical Pharmacist, [facility] [place]: [phone]

## 2019-02-21 NOTE — Patient Instructions (Signed)
Visit Information  Goals Addressed            This Visit's Progress     Patient Stated   . I can't afford my new eye drops for my glaucoma (pt-stated)       Current Barriers:  . Financial Barriers: patient has [facility] [employer] and reports copay for [name] is cost prohibitive at this time .   Pharmacist Clinical Goal(s):  [name] Over the next 30 days, patient will work with PharmD and providers to relieve medication access concerns  Interventions: . Comprehensive medication review completed; medication list updated in electronic medical record.  [name] by [facility] (for glaucoma):  Application process is complicated.  Will discuss with patient to see if she is able to fulfill the requests.  Mailed application to patient and asked her to discuss with her eye doctor. . Patient was prescribed generic Xalatan for now as she is able to afford generic copay.  She is using in right eye only as directed by eye doctor to determine if this generic medication will work for her.  Patient Self Care Activities:  . Patient will provide necessary portions of application   Please see past updates related to this goal by clicking on the "Past Updates" button in the selected goal         The patient verbalized understanding of instructions provided today and declined a print copy of patient instruction materials.   The care management team will reach out to the patient again over the next 6-8 weeks.  Regina Eck, PharmD, BCPS Clinical Pharmacist, [facility] [place]: [phone]

## 2019-02-22 ENCOUNTER — Encounter: Payer: Self-pay | Admitting: Internal Medicine

## 2019-02-22 LAB — BMP8+EGFR
BUN/Creatinine Ratio: 18 (ref 12–28)
BUN: 30 mg/dL — ABNORMAL HIGH (ref 8–27)
CO2: 20 mmol/L (ref 20–29)
Calcium: 9.3 mg/dL (ref 8.7–10.3)
Chloride: 106 mmol/L (ref 96–106)
Creatinine, Ser: 1.65 mg/dL — ABNORMAL HIGH (ref 0.57–1.00)
GFR calc Af Amer: 36 mL/min/{1.73_m2} — ABNORMAL LOW (ref >59)
GFR calc non Af Amer: 31 mL/min/{1.73_m2} — ABNORMAL LOW (ref >59)
Glucose: 115 mg/dL — ABNORMAL HIGH (ref 65–99)
Potassium: 5.4 mmol/L — ABNORMAL HIGH (ref 3.5–5.2)
Sodium: 138 mmol/L (ref 134–144)

## 2019-02-23 ENCOUNTER — Encounter: Payer: Self-pay | Admitting: Internal Medicine

## 2019-02-24 ENCOUNTER — Other Ambulatory Visit: Payer: Self-pay | Admitting: Gastroenterology

## 2019-02-24 ENCOUNTER — Ambulatory Visit (INDEPENDENT_AMBULATORY_CARE_PROVIDER_SITE_OTHER): Payer: Medicare Other | Admitting: Pharmacist

## 2019-02-24 DIAGNOSIS — K7581 Nonalcoholic steatohepatitis (NASH): Secondary | ICD-10-CM | POA: Diagnosis not present

## 2019-02-24 DIAGNOSIS — Z794 Long term (current) use of insulin: Secondary | ICD-10-CM

## 2019-02-24 DIAGNOSIS — N183 Chronic kidney disease, stage 3 unspecified: Secondary | ICD-10-CM

## 2019-02-24 DIAGNOSIS — E1122 Type 2 diabetes mellitus with diabetic chronic kidney disease: Secondary | ICD-10-CM | POA: Diagnosis not present

## 2019-02-24 DIAGNOSIS — I5022 Chronic systolic (congestive) heart failure: Secondary | ICD-10-CM | POA: Diagnosis not present

## 2019-02-24 DIAGNOSIS — K625 Hemorrhage of anus and rectum: Secondary | ICD-10-CM | POA: Diagnosis not present

## 2019-02-24 DIAGNOSIS — Z95810 Presence of automatic (implantable) cardiac defibrillator: Secondary | ICD-10-CM | POA: Diagnosis not present

## 2019-02-24 DIAGNOSIS — K5900 Constipation, unspecified: Secondary | ICD-10-CM | POA: Diagnosis not present

## 2019-02-24 DIAGNOSIS — Z4502 Encounter for adjustment and management of automatic implantable cardiac defibrillator: Secondary | ICD-10-CM | POA: Diagnosis not present

## 2019-02-25 ENCOUNTER — Other Ambulatory Visit (HOSPITAL_COMMUNITY)
Admission: RE | Admit: 2019-02-25 | Discharge: 2019-02-25 | Disposition: A | Discharge status: Home / Self Care | Payer: Medicare Other | Source: Ambulatory Visit | Attending: Gastroenterology | Admitting: Gastroenterology

## 2019-02-25 DIAGNOSIS — Z01812 Encounter for preprocedural laboratory examination: Secondary | ICD-10-CM | POA: Insufficient documentation

## 2019-02-25 DIAGNOSIS — Z20828 Contact with and (suspected) exposure to other viral communicable diseases: Secondary | ICD-10-CM | POA: Diagnosis not present

## 2019-02-25 NOTE — Progress Notes (Addendum)
Pre-op endo call attempted. No answer.  Device orders faxed to Dr Einar Gip for ICD orders , request to fax back to [facility] endoscopy dept .

## 2019-02-28 LAB — NOVEL CORONAVIRUS, NAA (HOSP ORDER, SEND-OUT TO REF LAB; TAT 18-24 HRS): SARS-CoV-2, NAA: NOT DETECTED

## 2019-02-28 NOTE — Progress Notes (Signed)
sw patient, has quaratined after covid 19 testing. No symptoms or fever. Answered questions regarding procedure.  Arrival time 0600 tomorrow.

## 2019-03-01 ENCOUNTER — Ambulatory Visit (HOSPITAL_COMMUNITY): Payer: Medicare Other | Admitting: Anesthesiology

## 2019-03-01 ENCOUNTER — Ambulatory Visit (HOSPITAL_COMMUNITY)
Admission: RE | Admit: 2019-03-01 | Discharge: 2019-03-01 | Disposition: A | Discharge status: Home / Self Care | Payer: Medicare Other | Attending: Gastroenterology | Admitting: Gastroenterology

## 2019-03-01 ENCOUNTER — Encounter (HOSPITAL_COMMUNITY): Payer: Self-pay | Admitting: *Deleted

## 2019-03-01 ENCOUNTER — Telehealth: Payer: Self-pay

## 2019-03-01 ENCOUNTER — Other Ambulatory Visit: Payer: Self-pay

## 2019-03-01 ENCOUNTER — Encounter (HOSPITAL_COMMUNITY)
Admission: RE | Disposition: A | Discharge status: Home / Self Care | Payer: Self-pay | Source: Home / Self Care | Attending: Gastroenterology

## 2019-03-01 DIAGNOSIS — Z95810 Presence of automatic (implantable) cardiac defibrillator: Secondary | ICD-10-CM | POA: Diagnosis not present

## 2019-03-01 DIAGNOSIS — G4730 Sleep apnea, unspecified: Secondary | ICD-10-CM | POA: Insufficient documentation

## 2019-03-01 DIAGNOSIS — Z794 Long term (current) use of insulin: Secondary | ICD-10-CM | POA: Insufficient documentation

## 2019-03-01 DIAGNOSIS — Z79899 Other long term (current) drug therapy: Secondary | ICD-10-CM | POA: Diagnosis not present

## 2019-03-01 DIAGNOSIS — I129 Hypertensive chronic kidney disease with stage 1 through stage 4 chronic kidney disease, or unspecified chronic kidney disease: Secondary | ICD-10-CM | POA: Diagnosis not present

## 2019-03-01 DIAGNOSIS — E1122 Type 2 diabetes mellitus with diabetic chronic kidney disease: Secondary | ICD-10-CM | POA: Insufficient documentation

## 2019-03-01 DIAGNOSIS — J45909 Unspecified asthma, uncomplicated: Secondary | ICD-10-CM | POA: Diagnosis not present

## 2019-03-01 DIAGNOSIS — E785 Hyperlipidemia, unspecified: Secondary | ICD-10-CM | POA: Insufficient documentation

## 2019-03-01 DIAGNOSIS — D649 Anemia, unspecified: Secondary | ICD-10-CM | POA: Diagnosis not present

## 2019-03-01 DIAGNOSIS — Z6841 Body Mass Index (BMI) 40.0 and over, adult: Secondary | ICD-10-CM | POA: Insufficient documentation

## 2019-03-01 DIAGNOSIS — N189 Chronic kidney disease, unspecified: Secondary | ICD-10-CM | POA: Diagnosis not present

## 2019-03-01 DIAGNOSIS — K5730 Diverticulosis of large intestine without perforation or abscess without bleeding: Secondary | ICD-10-CM | POA: Insufficient documentation

## 2019-03-01 DIAGNOSIS — D125 Benign neoplasm of sigmoid colon: Secondary | ICD-10-CM | POA: Diagnosis not present

## 2019-03-01 DIAGNOSIS — K625 Hemorrhage of anus and rectum: Secondary | ICD-10-CM | POA: Diagnosis not present

## 2019-03-01 DIAGNOSIS — Z7982 Long term (current) use of aspirin: Secondary | ICD-10-CM | POA: Insufficient documentation

## 2019-03-01 DIAGNOSIS — Z1211 Encounter for screening for malignant neoplasm of colon: Secondary | ICD-10-CM | POA: Diagnosis not present

## 2019-03-01 DIAGNOSIS — K635 Polyp of colon: Secondary | ICD-10-CM | POA: Diagnosis not present

## 2019-03-01 DIAGNOSIS — Z96652 Presence of left artificial knee joint: Secondary | ICD-10-CM | POA: Diagnosis not present

## 2019-03-01 HISTORY — PX: COLONOSCOPY WITH PROPOFOL: SHX5780

## 2019-03-01 HISTORY — PX: BIOPSY: SHX5522

## 2019-03-01 LAB — GLUCOSE, CAPILLARY: Glucose-Capillary: 72 mg/dL (ref 70–99)

## 2019-03-01 SURGERY — COLONOSCOPY WITH PROPOFOL
Anesthesia: Monitor Anesthesia Care

## 2019-03-01 MED ORDER — PROPOFOL 10 MG/ML IV BOLUS
INTRAVENOUS | Status: DC | PRN
  Administered 2019-03-01: 50 mg via INTRAVENOUS
  Administered 2019-03-01: 30 mg via INTRAVENOUS
  Administered 2019-03-01: 20 mg via INTRAVENOUS

## 2019-03-01 MED ORDER — SODIUM CHLORIDE 0.9 % IV SOLN: INTRAVENOUS | Status: DC

## 2019-03-01 MED ORDER — PROPOFOL 500 MG/50ML IV EMUL
INTRAVENOUS | Status: DC | PRN
  Administered 2019-03-01: 50 ug/kg/min via INTRAVENOUS

## 2019-03-01 MED ORDER — LACTATED RINGERS IV SOLN
INTRAVENOUS | Status: DC
  Administered 2019-03-01: 1000 mL via INTRAVENOUS

## 2019-03-01 MED ORDER — PROPOFOL 500 MG/50ML IV EMUL
INTRAVENOUS | Status: AC
  Filled 2019-03-01: qty 50

## 2019-03-01 SURGICAL SUPPLY — 22 items

## 2019-03-01 NOTE — Patient Instructions (Signed)
Visit Information  Goals Addressed            This Visit's Progress     Patient Stated   . I can't afford my new eye drops for my glaucoma (pt-stated)       Current Barriers:  . Financial Barriers: patient has [facility] [employer] and reports copay for [name] is cost prohibitive at this time  Pharmacist Clinical Goal(s):  [name] Over the next 30 days, patient will work with PharmD and providers to relieve medication access concerns  Interventions: . Comprehensive medication review completed; medication list updated in electronic medical record.  [name] by [facility] (for glaucoma):  Application process is complicated.  Will discuss with patient to see if she is able to fulfill the requests.  Mailed application to patient and asked her to discuss with her eye doctor. . Patient was prescribed generic Xalatan for now as she is able to afford generic copay.  She is using in right eye only as directed by eye doctor to determine if this generic medication will work for her. [name] Update: patient's eye doctor noted patient was having good results with generic Xalatan.  Will proceed with using generic Xalatan in both eyes.  Will not be switching to Vyzulta.  Patient Self Care Activities:  . Patient will provide necessary portions of application   Please see past updates related to this goal by clicking on the "Past Updates" button in the selected goal      . I would like to manage my chronic health conditions (pt-stated)       Current Barriers:  [name] Knowledge Deficits related to management of chronic conditions  Pharmacist Clinical Goal(s):  [name] Over the next 90 days, patient will work with CCM team & PCP to address needs related to optimized medication management of chronic conditions  Interventions: . Comprehensive medication review performed.  Reviewed medication fill history via insurance claims data confirming patient appears compliant with having her medications filled on time as  prescribed by provider. . Reviewed & discussed the following diabetes-related information with patient: o Continue checking blood sugars as directed o Follow ADA recommended "diabetes-friendly" diet  (reviewed healthy snack/food options) o Confirmed current DM regimen: Trulicity 1.5 mg weekly, 70/30 insulin-30 units qHS - Phone number provided for patient to call in refills from [employer] patient assistance program [phone]) o Discussed insulin/GLP-1 injection technique; Patient uses AccuCheck Aviva glucometer. o Reviewed medication purpose/side effects-->patient denies adverse events, denies hypoglycemia, reports FBG 100-120s, at night after dinner it maxes out at 150-180, she then injects her insulin at bedtime. o Most recent A1c is 7.0% on 02/10/19 (last A1c was 6.7% on 09/30/18)--patient states she has been unable to participate in the [facility] water aerobics classes due to [place].  She is trying to get motivated again. o Patient reports taking aspirin and statin.  Pravastatin filled 03/01/19 for #90 (sent in per patient request) o Continue taking all medications as prescribed by provider . For blood pressure-->reviewed medication regimen: carvedilol, hydralazine, spironolactone (recently reduced to 25mg  per cards due to hyperkalemia; Recent EF improved to 55%) . Will continue to follow.  Patient Self Care Activities:  . Self administers medications as prescribed . Attends all scheduled provider appointments . Calls pharmacy for medication refills . Performs ADL's independently . Performs IADL's independently . Calls provider office for new concerns or questions  Please see past updates related to this goal by clicking on the "Past Updates" button in the selected goal  The patient verbalized understanding of instructions provided today and declined a print copy of patient instruction materials.   The care management team will reach out to the patient again over the next 4  weeks.  SIGNATURE Regina Eck, PharmD, BCPS Clinical Pharmacist, [facility] [place]: [phone]

## 2019-03-01 NOTE — Op Note (Signed)
[facility] Patient Name: Yvonne Miller Procedure Date: 03/01/2019 MRN: [id number] Attending MD: Juanita Craver , MD Date of Birth: Dec 02, 1950 CSN: [id number] Age: 68 Admit Type: Outpatient Procedure:                Colonoscopy with cold biopsies. Indications:              Rectal bleeding; CRC screening for colorectal                            malignant neoplasm. Providers:                Juanita Craver, MD, Cleda Daub, RN, William Dalton, Technician, Heide Scales, CRNA Referring MD:             Theda Belfast. Baird Cancer, MD Medicines:                Monitored Anesthesia Care Complications:            No immediate complications. Estimated Blood Loss:     Estimated blood loss was minimal. Procedure:                Pre-Anesthesia Assessment: - Prior to the                            procedure, a history and physical was performed,                            and patient medications and allergies were                            reviewed. The patient's tolerance of previous                            anesthesia was also reviewed. The risks and                            benefits of the procedure and the sedation options                            and risks were discussed with the patient. All                            questions were answered, and informed consent was                            obtained. Prior Anticoagulants: The patient has                            taken no previous anticoagulant or antiplatelet                            agents. ASA Grade Assessment: III - A patient with  severe systemic disease. After reviewing the risks                            and benefits, the patient was deemed in                            satisfactory condition to undergo the procedure.                            After obtaining informed consent, the colonoscope                            was passed under direct vision. Throughout the                             procedure, the patient's blood pressure, pulse, and                            oxygen saturations were monitored continuously. The                            CF-HQ190L ([id number]) Olympus colonoscope was                            introduced through the anus and advanced to the the                            terminal ileum, with identification of the                            appendiceal orifice and IC valve. The colonoscopy                            was performed without difficulty. The patient                            tolerated the procedure well. The quality of the                            bowel preparation was adequate. The terminal ileum,                            the ileocecal valve, the appendiceal orifice and                            the rectum were photographed. The bowel preparation                            used was GoLYTELY via split dose instruction. Scope In: 7:32:26 AM Scope Out: 7:51:19 AM Scope Withdrawal Time: 0 hours 13 minutes 4 seconds  Total Procedure Duration: 0 hours 18 minutes 53 seconds  Findings:      A few small-mouthed diverticula were found in the entire colon.      Three small sessile  polyps were found in the distal sigmoid colon-these       were removed by cold biopsies.      The terminal ileum appeared normal.      No additional abnormalities were found on retroflexion. Impression:               - Few small scattered diverticula in the entire                            examined colon.                           - Three small sessile polyps in the distal sigmoid                            colon-removed by cold biopsies.                           - The examined portion of the ileum was normal. Moderate Sedation:      MAC used. Recommendation:           - High fiber, low fat diet with augmented water                            consumption daily.                           - Continue present medications.                            - Await pathology results.                           - Repeat colonoscopy in 7 years for surveillance.                           - Return to GI office PRN.                           - If the patient has any abnormal GI symptoms in                            the interim, she has been advised to call the                            office ASAP for further recommendations. Procedure Code(s):        --- Professional ---                           [phone], Colonoscopy, flexible; with biopsy, single                            or multiple Diagnosis Code(s):        --- Professional ---                           D12.5, Benign neoplasm of sigmoid colon  K57.30, Diverticulosis of large intestine without                            perforation or abscess without bleeding                           K62.5, Hemorrhage of anus and rectum                           Z12.11, Encounter for screening for malignant                            neoplasm of colon CPT copyright 2019 American Medical Association. All rights reserved. The codes documented in this report are preliminary and upon coder review may  be revised to meet current compliance requirements. Juanita Craver, MD Juanita Craver, MD 03/01/2019 8:06:42 AM This report has been signed electronically. Number of Addenda: 0

## 2019-03-01 NOTE — H&P (Addendum)
Yvonne Miller is an 68 y.o. female.   Chief Complaint: Colorectal cancer screening HPI: [name] is a 68 year old black female who presents to [facility] today for screening colonoscopy.  She had problems with chronic constipation and had rectal bleeding in the recent past with straining.  She has a good appetite and her weight has been stable.  She denies history of abdominal pain, nausea, vomiting, acid reflux, dysphagia or odynophagia.  There is no known family history of colon cancer celiac sprue or IBD.  She had a normal colonoscopy done in March 2009,   Past Medical History:  Diagnosis Date  . AICD (automatic cardioverter/defibrillator) present   . Arthritis   . Asthma   . Carpal tunnel syndrome, bilateral   . CKD (chronic kidney disease)   . Diabetes mellitus   . Encounter for assessment of implantable cardioverter-defibrillator (ICD)   . GERD (gastroesophageal reflux disease)   . Hyperlipemia   . Hypertension   . Obesity    morbid  . Sleep apnea    wears CPAP set at 12  . Wears glasses    Past Surgical History:  Procedure Laterality Date  . ABDOMINAL HYSTERECTOMY    . BREAST SURGERY  1968   breast mass excision  . CARPAL TUNNEL RELEASE Left 07/10/2017   Procedure: CARPAL TUNNEL RELEASE;  Surgeon: Ashok Pall, MD;  Location: [place];  Service: Neurosurgery;  Laterality: Left;  left  . CARPAL TUNNEL RELEASE Right 03/19/2018   Procedure: RIGHT CARPAL TUNNEL RELEASE;  Surgeon: Ashok Pall, MD;  Location: [place];  Service: Neurosurgery;  Laterality: Right;  right  . CERVICAL SPINE SURGERY  2006  . CHOLECYSTECTOMY    . DILATION AND CURETTAGE OF UTERUS    . ICD IMPLANT    . JOINT REPLACEMENT    . TONSILLECTOMY  1961  . TOTAL KNEE ARTHROPLASTY Left 01/26/2018   Procedure: LEFT TOTAL KNEE ARTHROPLASTY;  Surgeon: Mcarthur Rossetti, MD;  Location: [place];  Service: Orthopedics;  Laterality: Left;   Family History  Problem Relation Age of Onset  .  Hypertension Mother   . Stroke Mother   . Heart disease Father   . Heart attack Sister   . Heart attack Brother    Social History:  reports that she has never smoked. She has never used smokeless tobacco. She reports that she does not drink alcohol or use drugs.  Allergies:  Allergies  Allergen Reactions  . Other Other (See Comments)    NO BLOOD   . Jehovah witness    Medications Prior to Admission  Medication Sig Dispense Refill  . albuterol (PROVENTIL HFA;VENTOLIN HFA) 108 (90 Base) MCG/ACT inhaler Inhale 2 puffs into the lungs every 4 (four) hours as needed for wheezing or shortness of breath.     [name] amLODipine (NORVASC) 5 MG tablet TAKE 1 TABLET BY MOUTH  DAILY (Patient taking differently: Take 5 mg by mouth daily. ) 90 tablet 3  . aspirin EC 81 MG tablet Take 81 mg by mouth at bedtime.     . B Complex Vitamins (B COMPLEX-B12) TABS Take 1 tablet by mouth daily.    . carvedilol (COREG) 25 MG tablet TAKE 1 AND 1/2 TABLETS BY  MOUTH TWO TIMES DAILY (Patient taking differently: Take 37.5 mg by mouth 2 (two) times daily. ) 270 tablet 3  . Cholecalciferol (VITAMIN D) 50 MCG (2000 UT) tablet Take 2,000 Units by mouth daily.    . Chromium 1000 MCG TABS Take  1,000 mcg by mouth daily.     . Cinnamon 500 MG capsule Take 500 mg by mouth daily.    . fluticasone (FLONASE) 50 MCG/ACT nasal spray Place 1 spray into both nostrils daily. (Patient taking differently: Place 1 spray into both nostrils as needed for allergies. ) 16 g 2  . hydrALAZINE (APRESOLINE) 50 MG tablet TAKE 1 TABLET BY MOUTH  TWICE DAILY (Patient taking differently: Take 50 mg by mouth 2 (two) times daily. ) 180 tablet 3  . insulin lispro protamine-lispro (HUMALOG 75/25 MIX) (75-25) 100 UNIT/ML SUSP injection Inject 30-40 Units into the skin at bedtime.    [name] latanoprost (XALATAN) 0.005 % ophthalmic solution Place 1 drop into both eyes at bedtime.     [name] MAGNESIUM PO Take 1,350 mg by mouth at bedtime.    [name] nystatin (NYSTATIN) powder  Apply topically 3 (three) times daily as needed. As needed (Patient taking differently: Apply 1 g topically 3 (three) times daily as needed (yeast). ) 60 g 2  . Omega-3 Fatty Acids (FISH OIL) 500 MG CAPS Take 500 mg by mouth daily.    . pravastatin (PRAVACHOL) 40 MG tablet TAKE 1 TABLET BY MOUTH  EVERY DAY (Patient taking differently: Take 40 mg by mouth at bedtime. ) 90 tablet 3  . spironolactone (ALDACTONE) 50 MG tablet Take 50 mg by mouth daily.    . TRULICITY 1.5 ZD/6.6YQ SOPN INJECT 1.5 MG INTO THE SKIN ONCE A WEEK. (Patient taking differently: Inject 1.5 mg as directed every Wednesday. ) 2 pen 2  . valsartan-hydrochlorothiazide (DIOVAN-HCT) 160-25 MG tablet TAKE 1 TABLET BY MOUTH  DAILY 90 tablet 3  . ACCU-CHEK AVIVA PLUS test strip CHECK 4 TIMES BY INTRADERMAL ROUTE EVERY DAY    . spironolactone (ALDACTONE) 25 MG tablet Take 1 tablet (25 mg total) by mouth every morning. (Patient not taking: Reported on 02/25/2019) 90 tablet 3    Results for orders placed or performed during the hospital encounter of 03/01/19 (from the past 48 hour(s))  Glucose, capillary     Status: None   Collection Time: 03/01/19  6:57 AM  Result Value Ref Range   Glucose-Capillary 72 70 - 99 mg/dL   Review of Systems  Eyes: Negative.   Respiratory: Negative.   Cardiovascular: Negative.   Gastrointestinal: Positive for blood in stool and constipation. Negative for abdominal pain, diarrhea, heartburn, nausea and vomiting.  Musculoskeletal: Negative.   Neurological: Negative.   Endo/Heme/Allergies: Negative.   Psychiatric/Behavioral: The patient has insomnia.    Blood pressure 121/62, pulse 78, temperature 97.7 F (36.5 C), temperature source Oral, resp. rate 20, height 5\' 3"  (1.6 m), weight 113.9 kg, SpO2 99 %. Physical Exam  Constitutional: She is oriented to person, place, and time. She appears well-developed and well-nourished.  Morbidly obese  HENT:  Head: Normocephalic and atraumatic.  Eyes: Pupils are  equal, round, and reactive to light. Conjunctivae and EOM are normal.  Neck: Normal range of motion. Neck supple.  Cardiovascular: Normal rate and regular rhythm.  Respiratory: Effort normal and breath sounds normal.  AICD is palpable on the left chest  GI: Soft. Bowel sounds are normal.  Musculoskeletal: Normal range of motion.  Neurological: She is alert and oriented to person, place, and time.  Skin: Skin is warm and dry.  Psychiatric: She has a normal mood and affect. Her behavior is normal. Judgment and thought content normal.    Assessment/Plan Colorectal cancer screening/constipation/rectal bleeding-proceed with a colonoscopy at this time.  Juanita Craver, MD  03/01/2019, 7:08 AM

## 2019-03-01 NOTE — Anesthesia Procedure Notes (Signed)
Procedure Name: MAC Date/Time: 03/01/2019 7:27 AM Performed by: Deliah Boston, CRNA Pre-anesthesia Checklist: Patient identified, Emergency Drugs available, Suction available and Patient being monitored Patient Re-evaluated:Patient Re-evaluated prior to induction Oxygen Delivery Method: Simple face mask Preoxygenation: Pre-oxygenation with 100% oxygen Induction Type: IV induction Placement Confirmation: positive ETCO2 and breath sounds checked- equal and bilateral

## 2019-03-01 NOTE — Discharge Instructions (Signed)

## 2019-03-01 NOTE — Transfer of Care (Signed)
Immediate Anesthesia Transfer of Care Note  Patient: Missey Hasley Vilar  Procedure(s) Performed: Procedure(s): COLONOSCOPY WITH PROPOFOL (N/A) BIOPSY  Patient Location: PACU  Anesthesia Type:MAC  Level of Consciousness: Patient easily awoken, sedated, comfortable, cooperative, following commands, responds to stimulation.   Airway & Oxygen Therapy: Patient spontaneously breathing, ventilating well, oxygen via simple oxygen mask.  Post-op Assessment: Report given to PACU RN, vital signs reviewed and stable, moving all extremities.   Post vital signs: Reviewed and stable.  Complications: No apparent anesthesia complications  Last Vitals:  Vitals Value Taken Time  BP    Temp    Pulse 71 03/01/19 0800  Resp 24 03/01/19 0800  SpO2 100 % 03/01/19 0800  Vitals shown include unvalidated device data.  Last Pain:  Vitals:   03/01/19 7517  TempSrc: Oral  PainSc: 0-No pain         Complications: No apparent anesthesia complications

## 2019-03-01 NOTE — Progress Notes (Signed)
Chronic Care Management  Visit Note  02/24/2019 Name: Yvonne Miller MRN: [id number] DOB: October 11, 1950  Referred by: Yvonne Chard, MD Reason for referral : Chronic Care Management   Yvonne Miller is a 68 y.o. year old female who is a primary care patient of Yvonne Chard, MD. The CCM team was consulted for assistance with chronic disease management and care coordination needs related to HLD and DMII  Review of patient status, including review of consultants reports, relevant laboratory and other test results, and collaboration with appropriate care team members and the patient's provider was performed as part of comprehensive patient evaluation and provision of chronic care management services.    I spoke with [name] by telephone today.  Advanced Directives Status: N See Care Plan and Vynca application for related entries.   Medications: Outpatient Encounter Medications as of 02/24/2019  Medication Sig Note  . ACCU-CHEK AVIVA PLUS test strip CHECK 4 TIMES BY INTRADERMAL ROUTE EVERY DAY   . albuterol (PROVENTIL HFA;VENTOLIN HFA) 108 (90 Base) MCG/ACT inhaler Inhale 2 puffs into the lungs every 4 (four) hours as needed for wheezing or shortness of breath.    [name] amLODipine (NORVASC) 5 MG tablet TAKE 1 TABLET BY MOUTH  DAILY (Patient taking differently: Take 5 mg by mouth daily. )   . aspirin EC 81 MG tablet Take 81 mg by mouth at bedtime.  02/25/2019: On hold for procedure  . B Complex Vitamins (B COMPLEX-B12) TABS Take 1 tablet by mouth daily.   . carvedilol (COREG) 25 MG tablet TAKE 1 AND 1/2 TABLETS BY  MOUTH TWO TIMES DAILY (Patient taking differently: Take 37.5 mg by mouth 2 (two) times daily. )   . Cholecalciferol (VITAMIN D) 50 MCG (2000 UT) tablet Take 2,000 Units by mouth daily.   . Chromium 1000 MCG TABS Take 1,000 mcg by mouth daily.    . Cinnamon 500 MG capsule Take 500 mg by mouth daily.   . fluticasone (FLONASE) 50 MCG/ACT nasal spray Place 1 spray into both nostrils  daily. (Patient taking differently: Place 1 spray into both nostrils as needed for allergies. )   . hydrALAZINE (APRESOLINE) 50 MG tablet TAKE 1 TABLET BY MOUTH  TWICE DAILY (Patient taking differently: Take 50 mg by mouth 2 (two) times daily. )   . insulin lispro protamine-lispro (HUMALOG 75/25 MIX) (75-25) 100 UNIT/ML SUSP injection Inject 30-40 Units into the skin at bedtime.   [name] latanoprost (XALATAN) 0.005 % ophthalmic solution Place 1 drop into both eyes at bedtime.    [name] nystatin (NYSTATIN) powder Apply topically 3 (three) times daily as needed. As needed (Patient taking differently: Apply 1 g topically 3 (three) times daily as needed (yeast). )   . Omega-3 Fatty Acids (FISH OIL) 500 MG CAPS Take 500 mg by mouth daily.   . pravastatin (PRAVACHOL) 40 MG tablet TAKE 1 TABLET BY MOUTH  EVERY DAY (Patient taking differently: Take 40 mg by mouth at bedtime. )   . spironolactone (ALDACTONE) 50 MG tablet Take 50 mg by mouth daily.   . TRULICITY 1.5 YW/7.3XT SOPN INJECT 1.5 MG INTO THE SKIN ONCE A WEEK. (Patient taking differently: Inject 1.5 mg as directed every Wednesday. )   . valsartan-hydrochlorothiazide (DIOVAN-HCT) 160-25 MG tablet TAKE 1 TABLET BY MOUTH  DAILY   . [DISCONTINUED] cholecalciferol (VITAMIN D) 1000 units tablet Take 1,000 Units by mouth daily.   . [DISCONTINUED] insulin NPH-regular Human (70-30) 100 UNIT/ML injection Inject 20-60 Units into the skin See admin instructions.  50 units at bedtime (Patient taking differently: Inject 30 Units into the skin at bedtime. )   . [DISCONTINUED] Magnesium Hydroxide (MAGNESIA PO) Take 1,350 mg by mouth daily.   . [DISCONTINUED] MAGNESIUM PO Take 1,350 mg by mouth at bedtime.   . [DISCONTINUED] Omega-3 Fatty Acids (FISH OIL) 1000 MG CAPS Take 1,000 mg by mouth daily.    . [DISCONTINUED] spironolactone (ALDACTONE) 25 MG tablet Take 1 tablet (25 mg total) by mouth every morning. (Patient not taking: Reported on 02/25/2019)   . [DISCONTINUED] 0.9 %   sodium chloride infusion    . [DISCONTINUED] lactated ringers infusion     No facility-administered encounter medications on file as of 02/24/2019.      Objective:   Goals Addressed            This Visit's Progress     Patient Stated   . I can't afford my new eye drops for my glaucoma (pt-stated)       Current Barriers:  . Financial Barriers: patient has [facility] [employer] and reports copay for Yvonne Miller is cost prohibitive at this time  Pharmacist Clinical Goal(s):  [name] Over the next 30 days, patient will work with PharmD and providers to relieve medication access concerns  Interventions: . Comprehensive medication review completed; medication list updated in electronic medical record.  Yvonne Miller by [facility] (for glaucoma):  Application process is complicated.  Will discuss with patient to see if she is able to fulfill the requests.  Mailed application to patient and asked her to discuss with her eye doctor. . Patient was prescribed generic Xalatan for now as she is able to afford generic copay.  She is using in right eye only as directed by eye doctor to determine if this generic medication will work for her. [name] Update: patient's eye doctor noted patient was having good results with generic Xalatan.  Will proceed with using generic Xalatan in both eyes.  Will not be switching to Vyzulta.  Patient Self Care Activities:  . Patient will provide necessary portions of application   Please see past updates related to this goal by clicking on the "Past Updates" button in the selected goal      . I would like to manage my chronic health conditions (pt-stated)       Current Barriers:  [name] Knowledge Deficits related to management of chronic conditions  Pharmacist Clinical Goal(s):  [name] Over the next 90 days, patient will work with CCM team & PCP to address needs related to optimized medication management of chronic conditions  Interventions: . Comprehensive medication review  performed.  Reviewed medication fill history via insurance claims data confirming patient appears compliant with having her medications filled on time as prescribed by provider. . Reviewed & discussed the following diabetes-related information with patient: o Continue checking blood sugars as directed o Follow ADA recommended "diabetes-friendly" diet  (reviewed healthy snack/food options) o Confirmed current DM regimen: Trulicity 1.5 mg weekly, 70/30 insulin-30 units qHS - Phone number provided for patient to call in refills from [employer] patient assistance program [phone]) o Discussed insulin/GLP-1 injection technique; Patient uses AccuCheck Aviva glucometer. o Reviewed medication purpose/side effects-->patient denies adverse events, denies hypoglycemia, reports FBG 100-120s, at night after dinner it maxes out at 150-180, she then injects her insulin at bedtime. o Most recent A1c is 7.0% on 02/10/19 (last A1c was 6.7% on 09/30/18)--patient states she has been unable to participate in the [facility] water aerobics classes due to [place].  She is trying to get motivated again. o Patient reports taking aspirin and statin.  Pravastatin filled 03/01/19 for #90 (sent in per patient request) o Continue taking all medications as prescribed by provider . For blood pressure-->reviewed medication regimen: carvedilol, hydralazine, spironolactone (recently reduced to 25mg  per cards due to hyperkalemia; Recent EF improved to 55%) . Will continue to follow.  Patient Self Care Activities:  . Self administers medications as prescribed . Attends all scheduled provider appointments . Calls pharmacy for medication refills . Performs ADL's independently . Performs IADL's independently . Calls provider office for new concerns or questions  Please see past updates related to this goal by clicking on the "Past Updates" button in the selected goal         Plan:   The care management team will reach out to the  patient again over the next 4 weeks.  Provider Signature Regina Eck, PharmD, BCPS Clinical Pharmacist, [facility] [place]: [phone]

## 2019-03-01 NOTE — Anesthesia Preprocedure Evaluation (Signed)
Anesthesia Evaluation  Patient identified by MRN, date of birth, ID band Patient awake    Reviewed: Allergy & Precautions, NPO status , Patient's Chart, lab work & pertinent test results, reviewed documented beta blocker date and time   Airway Mallampati: II  TM Distance: >3 FB Neck ROM: Full    Dental  (+) Dental Advisory Given   Pulmonary asthma , sleep apnea ,    breath sounds clear to auscultation       Cardiovascular hypertension, Pt. on medications and Pt. on home beta blockers + dysrhythmias + Cardiac Defibrillator  Rhythm:Regular Rate:Normal     Neuro/Psych negative neurological ROS     GI/Hepatic Neg liver ROS, GERD  ,  Endo/Other  diabetes, Type 2, Insulin DependentMorbid obesity  Renal/GU CRFRenal disease     Musculoskeletal  (+) Arthritis ,   Abdominal   Peds  Hematology  (+) anemia ,   Anesthesia Other Findings   Reproductive/Obstetrics                             Anesthesia Physical Anesthesia Plan  ASA: III  Anesthesia Plan: MAC   Post-op Pain Management:    Induction: Intravenous  PONV Risk Score and Plan: 2 and Ondansetron, Treatment may vary due to age or medical condition and Propofol infusion  Airway Management Planned: Simple Face Mask and Natural Airway  Additional Equipment:   Intra-op Plan:   Post-operative Plan:   Informed Consent: I have reviewed the patients History and Physical, chart, labs and discussed the procedure including the risks, benefits and alternatives for the proposed anesthesia with the patient or authorized representative who has indicated his/her understanding and acceptance.       Plan Discussed with: CRNA  Anesthesia Plan Comments:         Anesthesia Quick Evaluation

## 2019-03-02 ENCOUNTER — Encounter: Payer: Self-pay | Admitting: Cardiology

## 2019-03-02 ENCOUNTER — Encounter (HOSPITAL_COMMUNITY): Payer: Self-pay | Admitting: Gastroenterology

## 2019-03-02 LAB — SURGICAL PATHOLOGY

## 2019-03-02 NOTE — Anesthesia Postprocedure Evaluation (Signed)
Anesthesia Post Note  Patient: Yvonne Miller  Procedure(s) Performed: COLONOSCOPY WITH PROPOFOL (N/A ) BIOPSY     Patient location during evaluation: PACU Anesthesia Type: MAC Level of consciousness: awake and alert Pain management: pain level controlled Vital Signs Assessment: post-procedure vital signs reviewed and stable Respiratory status: spontaneous breathing, nonlabored ventilation, respiratory function stable and patient connected to nasal cannula oxygen Cardiovascular status: stable and blood pressure returned to baseline Postop Assessment: no apparent nausea or vomiting Anesthetic complications: no    Last Vitals:  Vitals:   03/01/19 0810 03/01/19 0820  BP: 127/67 122/71  Pulse: 70 66  Resp: 20 20  Temp:    SpO2: 100% 98%    Last Pain:  Vitals:   03/01/19 0759  TempSrc: Temporal  PainSc: 0-No pain                 Tiajuana Amass

## 2019-03-03 ENCOUNTER — Other Ambulatory Visit: Payer: Self-pay | Admitting: Internal Medicine

## 2019-03-10 ENCOUNTER — Telehealth: Payer: Self-pay

## 2019-03-25 ENCOUNTER — Telehealth: Payer: Self-pay

## 2019-03-27 DIAGNOSIS — Z95810 Presence of automatic (implantable) cardiac defibrillator: Secondary | ICD-10-CM

## 2019-03-27 DIAGNOSIS — Z4502 Encounter for adjustment and management of automatic implantable cardiac defibrillator: Secondary | ICD-10-CM

## 2019-03-27 DIAGNOSIS — I5022 Chronic systolic (congestive) heart failure: Secondary | ICD-10-CM

## 2019-04-05 ENCOUNTER — Telehealth: Payer: Self-pay | Admitting: Pharmacist

## 2019-04-06 ENCOUNTER — Ambulatory Visit (INDEPENDENT_AMBULATORY_CARE_PROVIDER_SITE_OTHER): Payer: Medicare Other | Admitting: Pharmacist

## 2019-04-06 DIAGNOSIS — E1122 Type 2 diabetes mellitus with diabetic chronic kidney disease: Secondary | ICD-10-CM

## 2019-04-06 DIAGNOSIS — I10 Essential (primary) hypertension: Secondary | ICD-10-CM

## 2019-04-06 DIAGNOSIS — N183 Chronic kidney disease, stage 3 unspecified: Secondary | ICD-10-CM | POA: Diagnosis not present

## 2019-04-06 DIAGNOSIS — Z794 Long term (current) use of insulin: Secondary | ICD-10-CM | POA: Diagnosis not present

## 2019-04-11 NOTE — Progress Notes (Signed)
Chronic Care Management   Visit Note  04/06/2019 Name: IZABEL CHIM MRN: [id number] DOB: [birth year]  Referred by: [name], MD Reason for referral : Chronic Care Management   RANDA RISS is a 68 y.o. year old female who is a primary care patient of [name], MD. The CCM team was consulted for assistance with chronic disease management and care coordination needs related to HTN, HLD and DMII  Review of patient status, including review of consultants reports, relevant laboratory and other test results, and collaboration with appropriate care team members and the patient's provider was performed as part of comprehensive patient evaluation and provision of chronic care management services.    I spoke with Ms. Miltner by telephone today.  Medications: Outpatient Encounter Medications as of 04/06/2019  Medication Sig Note  . ACCU-CHEK AVIVA PLUS test strip CHECK 4 TIMES BY INTRADERMAL ROUTE EVERY DAY   . albuterol (PROVENTIL HFA;VENTOLIN HFA) 108 (90 Base) MCG/ACT inhaler Inhale 2 puffs into the lungs every 4 (four) hours as needed for wheezing or shortness of breath.    [name] amLODipine (NORVASC) 5 MG tablet TAKE 1 TABLET BY MOUTH  DAILY (Patient taking differently: Take 5 mg by mouth daily. )   . aspirin EC 81 MG tablet Take 81 mg by mouth at bedtime.  02/25/2019: On hold for procedure  . B Complex Vitamins (B COMPLEX-B12) TABS Take 1 tablet by mouth daily.   . carvedilol (COREG) 25 MG tablet TAKE 1 AND 1/2 TABLETS BY  MOUTH TWO TIMES DAILY (Patient taking differently: Take 37.5 mg by mouth 2 (two) times daily. )   . Cholecalciferol (VITAMIN D) 50 MCG (2000 UT) tablet Take 2,000 Units by mouth daily.   . Chromium 1000 MCG TABS Take 1,000 mcg by mouth daily.    . Cinnamon 500 MG capsule Take 500 mg by mouth daily.   . fluticasone (FLONASE) 50 MCG/ACT nasal spray Place 1 spray into both nostrils daily. (Patient taking differently: Place 1 spray into both nostrils as needed for  allergies. )   . hydrALAZINE (APRESOLINE) 50 MG tablet TAKE 1 TABLET BY MOUTH  TWICE DAILY (Patient taking differently: Take 50 mg by mouth 2 (two) times daily. )   . insulin lispro protamine-lispro (HUMALOG 75/25 MIX) (75-25) 100 UNIT/ML SUSP injection Inject 30-40 Units into the skin at bedtime.   [name] latanoprost (XALATAN) 0.005 % ophthalmic solution Place 1 drop into both eyes at bedtime.    [name] nystatin (NYSTATIN) powder Apply topically 3 (three) times daily as needed. As needed (Patient taking differently: Apply 1 g topically 3 (three) times daily as needed (yeast). )   . Omega-3 Fatty Acids (FISH OIL) 500 MG CAPS Take 500 mg by mouth daily.   . pravastatin (PRAVACHOL) 40 MG tablet TAKE 1 TABLET BY MOUTH  EVERY DAY (Patient taking differently: Take 40 mg by mouth at bedtime. )   . spironolactone (ALDACTONE) 50 MG tablet Take 50 mg by mouth daily.   . TRULICITY 1.5 HY/8.6VH SOPN INJECT 1.5 MG INTO THE SKIN ONCE A WEEK. (Patient taking differently: Inject 1.5 mg as directed every Wednesday. )   . valsartan-hydrochlorothiazide (DIOVAN-HCT) 160-25 MG tablet TAKE 1 TABLET BY MOUTH  DAILY    No facility-administered encounter medications on file as of 04/06/2019.      Objective:   Goals Addressed            This Visit's Progress     Patient Stated   . COMPLETED: I can't afford  my new eye drops for my glaucoma (pt-stated)       Current Barriers:  . Financial Barriers: patient has [facility] [employer] and reports copay for [name] is cost prohibitive at this time  Pharmacist Clinical Goal(s):  [name] Over the next 30 days, patient will work with PharmD and providers to relieve medication access concerns  Interventions: . Comprehensive medication review completed; medication list updated in electronic medical record.  [name] by [facility] (for glaucoma):  Application process is complicated.  Will discuss with patient to see if she is able to fulfill the requests.  Mailed application to  patient and asked her to discuss with her eye doctor. . Patient was prescribed generic Xalatan for now as she is able to afford generic copay.  She is using in right eye only as directed by eye doctor to determine if this generic medication will work for her. [name] Update: patient's eye doctor noted patient was having good results with generic Xalatan.  Will proceed with using generic Xalatan in both eyes.  Will not be switching to Vyzulta. Goal cpmpleted  Patient Self Care Activities:  . Patient will provide necessary portions of application   Please see past updates related to this goal by clicking on the "Past Updates" button in the selected goal      . I would like to apply for medication assistance for my diabetes medications (pt-stated)       Current Barriers:  . Financial Barriers  Pharmacist Clinical Goal(s):  [name] Over the next 30 days, patient will work with PharmD to address needs related to applying for medication assistance through [place] for diabetes medications  goal re-established on 04/07/19 for PAP process 2021  Interventions: . Comprehensive medication review performed. . Collaboration with [facility] CPhT Etter Sjogren who will assist with application process through [facility] 2021 . Patient requests to be on Trulicity pens and Humalog 70/30 Kwikpen.  She states financial assistance will allow her to stay on track with her diabetes and she will not have to ration her medication.  She appears stable on this regimen, however we will continue to assess it.  At the time of approval, patient will have medication shipped directly to her home. . Patient is currently injecting 30 units of insulin (70/30) at bedtime and she is on Trulicity 1.5mg  weekly.  Patient Self Care Activities:  . Self administers medications as prescribed . Attends all scheduled provider appointments . Calls pharmacy for medication refills . Performs ADL's independently . Calls provider office for new concerns or  questions  Initial goal documentation     . I would like to manage my chronic health conditions (pt-stated)       Current Barriers:  [name] Knowledge Deficits related to management of chronic conditions  Pharmacist Clinical Goal(s):  [name] Over the next 90 days, patient will work with CCM team & PCP to address needs related to optimized medication management of chronic conditions  Interventions: . Comprehensive medication review performed.  Reviewed medication fill history via insurance claims data confirming patient appears compliant with having her medications filled on time as prescribed by provider. . Reviewed & discussed the following diabetes-related information with patient: o Continue checking blood sugars as directed o Follow ADA recommended "diabetes-friendly" diet  (reviewed healthy snack/food options) o Confirmed current DM regimen: Trulicity 1.5 mg weekly, 70/30 insulin-30 units qHS - Phone number provided for patient to call in refills from [employer] patient assistance program [phone]) o Discussed insulin/GLP-1 injection  technique; Patient uses AccuCheck Aviva glucometer. o Reviewed medication purpose/side effects-->patient denies adverse events, denies hypoglycemia, reports FBG 100-120s, at night after dinner it maxes out at 150-180, she then injects her insulin at bedtime. o Most recent A1c is 7.0% on 02/10/19 (last A1c was 6.7% on 09/30/18)--patient states she has been unable to participate in the [facility] water aerobics classes due to [place].  She is trying to get motivated again. o Patient reports taking aspirin and statin.  Pravastatin filled 03/01/19 for #90 (sent in per patient request) o Continue taking all medications as prescribed by provider.  NO changes to current medication list. . For blood pressure-->reviewed medication regimen: carvedilol, hydralazine, spironolactone (recently reduced to 25mg  per cards due to hyperkalemia; Recent EF improved to 55%) . Will continue  to follow.  Patient Self Care Activities:  . Self administers medications as prescribed . Attends all scheduled provider appointments . Calls pharmacy for medication refills . Performs ADL's independently . Performs IADL's independently . Calls provider office for new concerns or questions  Please see past updates related to this goal by clicking on the "Past Updates" button in the selected goal          Plan:   The care management team will reach out to the patient again over the next 30 days.   Provider Signature Regina Eck, PharmD, BCPS Clinical Pharmacist, [facility] [place]: [phone]

## 2019-04-11 NOTE — Patient Instructions (Signed)
Visit Information  Goals Addressed            This Visit's Progress     Patient Stated   . COMPLETED: I can't afford my new eye drops for my glaucoma (pt-stated)       Current Barriers:  . Financial Barriers: patient has [facility] [employer] and reports copay for [name] is cost prohibitive at this time  Pharmacist Clinical Goal(s):  [name] Over the next 30 days, patient will work with PharmD and providers to relieve medication access concerns  Interventions: . Comprehensive medication review completed; medication list updated in electronic medical record.  [name] by [facility] (for glaucoma):  Application process is complicated.  Will discuss with patient to see if she is able to fulfill the requests.  Mailed application to patient and asked her to discuss with her eye doctor. . Patient was prescribed generic Xalatan for now as she is able to afford generic copay.  She is using in right eye only as directed by eye doctor to determine if this generic medication will work for her. [name] Update: patient's eye doctor noted patient was having good results with generic Xalatan.  Will proceed with using generic Xalatan in both eyes.  Will not be switching to Vyzulta. Goal cpmpleted  Patient Self Care Activities:  . Patient will provide necessary portions of application   Please see past updates related to this goal by clicking on the "Past Updates" button in the selected goal      . I would like to apply for medication assistance for my diabetes medications (pt-stated)       Current Barriers:  . Financial Barriers  Pharmacist Clinical Goal(s):  [name] Over the next 30 days, patient will work with PharmD to address needs related to applying for medication assistance through [place] for diabetes medications  goal re-established on 04/07/19 for PAP process 2021  Interventions: . Comprehensive medication review performed. . Collaboration with [facility] CPhT Etter Sjogren who will assist with  application process through [facility] 2021 . Patient requests to be on Trulicity pens and Humalog 70/30 Kwikpen.  She states financial assistance will allow her to stay on track with her diabetes and she will not have to ration her medication.  She appears stable on this regimen, however we will continue to assess it.  At the time of approval, patient will have medication shipped directly to her home. . Patient is currently injecting 30 units of insulin (70/30) at bedtime and she is on Trulicity 1.5mg  weekly.  Patient Self Care Activities:  . Self administers medications as prescribed . Attends all scheduled provider appointments . Calls pharmacy for medication refills . Performs ADL's independently . Calls provider office for new concerns or questions  Initial goal documentation     . I would like to manage my chronic health conditions (pt-stated)       Current Barriers:  [name] Knowledge Deficits related to management of chronic conditions  Pharmacist Clinical Goal(s):  [name] Over the next 90 days, patient will work with CCM team & PCP to address needs related to optimized medication management of chronic conditions  Interventions: . Comprehensive medication review performed.  Reviewed medication fill history via insurance claims data confirming patient appears compliant with having her medications filled on time as prescribed by provider. . Reviewed & discussed the following diabetes-related information with patient: o Continue checking blood sugars as directed o Follow ADA recommended "diabetes-friendly" diet  (reviewed healthy snack/food options) o Confirmed current  DM regimen: Trulicity 1.5 mg weekly, 70/30 insulin-30 units qHS - Phone number provided for patient to call in refills from [employer] patient assistance program [phone]) o Discussed insulin/GLP-1 injection technique; Patient uses AccuCheck Aviva glucometer. o Reviewed medication purpose/side effects-->patient denies  adverse events, denies hypoglycemia, reports FBG 100-120s, at night after dinner it maxes out at 150-180, she then injects her insulin at bedtime. o Most recent A1c is 7.0% on 02/10/19 (last A1c was 6.7% on 09/30/18)--patient states she has been unable to participate in the [facility] water aerobics classes due to [place].  She is trying to get motivated again. o Patient reports taking aspirin and statin.  Pravastatin filled 03/01/19 for #90 (sent in per patient request) o Continue taking all medications as prescribed by provider.  NO changes to current medication list. . For blood pressure-->reviewed medication regimen: carvedilol, hydralazine, spironolactone (recently reduced to 25mg  per cards due to hyperkalemia; Recent EF improved to 55%) . Will continue to follow.  Patient Self Care Activities:  . Self administers medications as prescribed . Attends all scheduled provider appointments . Calls pharmacy for medication refills . Performs ADL's independently . Performs IADL's independently . Calls provider office for new concerns or questions  Please see past updates related to this goal by clicking on the "Past Updates" button in the selected goal         The patient verbalized understanding of instructions provided today and declined a print copy of patient instruction materials.   The care management team will reach out to the patient again over the next 30 days.   SIGNATURE Regina Eck, PharmD, BCPS Clinical Pharmacist, [facility] [place]: [phone]

## 2019-04-14 ENCOUNTER — Other Ambulatory Visit: Payer: Self-pay | Admitting: Pharmacy Technician

## 2019-04-14 NOTE — Patient Outreach (Signed)
[place] [facility]) Care Management  04/14/2019  CICELY ORTNER [birth year] [id number]                                       Medication Assistance Referral  Referral From: [facility] Embedded RPh Jenne Pane.   Medication/Company: Humalog Mix and Trulicity / [employer] Patient application portion:  Education officer, museum portion: Faxed  to Dr. Johnnye Lana Provider address/fax verified via: Office website   Follow up:  Will follow up with patient in 10-14 business days to confirm application(s) have been received.  Maud Deed Chana Bode [place] Certified Pharmacy Technician [place] Management Direct Dial:[phone]

## 2019-04-25 ENCOUNTER — Other Ambulatory Visit: Payer: Self-pay

## 2019-04-27 ENCOUNTER — Other Ambulatory Visit: Payer: Self-pay | Admitting: Pharmacy Technician

## 2019-04-27 NOTE — Patient Outreach (Signed)
[place] [facility]) Care Management  04/27/2019  Yvonne Miller 03-Sep-1950 [id number]   Received patient portion(s) of patient assistance application(s) for Humalog and Trulicity. Faxed completed application and required documents into [employer].  Will follow up with company(ies) in 10-14 business days to check status of application(s).  Maud Deed Chana Bode [place] Certified Pharmacy Technician [place] Management Direct [phone]

## 2019-05-11 ENCOUNTER — Other Ambulatory Visit: Payer: Self-pay | Admitting: Pharmacy Technician

## 2019-05-11 NOTE — Patient Outreach (Signed)
[place] [facility]) Care Management  05/11/2019  Yvonne Miller 1950/08/16 [id number]    Follow up call placed to [facility] regarding patient assistance application(s) for Trulicity and Humalog , [name] states that application has been received however is still being processed.   Follow up:  Will follow up with compnay in 5-7 business days to check application status  Maud Deed. Chana Bode [place] Certified Pharmacy Technician [place] Management Direct Dial:[phone]

## 2019-05-18 ENCOUNTER — Telehealth: Payer: Self-pay

## 2019-05-19 NOTE — Progress Notes (Signed)
This encounter was created in error - please disregard.

## 2019-05-23 ENCOUNTER — Other Ambulatory Visit: Payer: Self-pay | Admitting: Pharmacy Technician

## 2019-05-23 NOTE — Patient Outreach (Signed)
[facility]) Care Management  05/23/2019  Yvonne Miller 1951/04/22 [id number]    Follow up call placed to [facility] regarding patient assistance application(s) for Trulicity and Humalog , Seth Bake confirms patient has been approved as of 1/18 until 05/04/20. [name] to contact Ms. Ward to set up shipping.  Follow up:  Will route note to [place] to inform and will remove myself from care team.  Maud Deed. Chana Bode [place] Certified Pharmacy Technician [place] Management Direct [phone]

## 2019-05-30 ENCOUNTER — Telehealth: Payer: Self-pay | Admitting: Pharmacist

## 2019-06-03 ENCOUNTER — Ambulatory Visit: Payer: Self-pay | Admitting: Pharmacist

## 2019-06-03 DIAGNOSIS — N183 Chronic kidney disease, stage 3 unspecified: Secondary | ICD-10-CM

## 2019-06-03 DIAGNOSIS — E1122 Type 2 diabetes mellitus with diabetic chronic kidney disease: Secondary | ICD-10-CM

## 2019-06-06 NOTE — Progress Notes (Addendum)
Chronic Care Management     Visit Note  06/03/2019 Name: Yvonne Miller MRN: [id number] DOB: 1951-01-22  Referred by: [name], MD Reason for referral : Chronic Care Management   Yvonne Miller is a 69 y.o. year old female who is a primary care patient of [name], MD. The CCM team was consulted for assistance with chronic disease management and care coordination needs related to DMII  Review of patient status, including review of consultants reports, relevant laboratory and other test results, and collaboration with appropriate care team members and the patient's provider was performed as part of comprehensive patient evaluation and provision of chronic care management services.    Patient was contacted via telephone for today's visit regarding her diabetes and chronic care management   Medications: Outpatient Encounter Medications as of 06/03/2019  Medication Sig Note  . ACCU-CHEK AVIVA PLUS test strip CHECK 4 TIMES BY INTRADERMAL ROUTE EVERY DAY   . albuterol (PROVENTIL HFA;VENTOLIN HFA) 108 (90 Base) MCG/ACT inhaler Inhale 2 puffs into the lungs every 4 (four) hours as needed for wheezing or shortness of breath.    [name] amLODipine (NORVASC) 5 MG tablet TAKE 1 TABLET BY MOUTH  DAILY (Patient taking differently: Take 5 mg by mouth daily. )   . aspirin EC 81 MG tablet Take 81 mg by mouth at bedtime.  02/25/2019: On hold for procedure  . B Complex Vitamins (B COMPLEX-B12) TABS Take 1 tablet by mouth daily.   . carvedilol (COREG) 25 MG tablet TAKE 1 AND 1/2 TABLETS BY  MOUTH TWO TIMES DAILY (Patient taking differently: Take 37.5 mg by mouth 2 (two) times daily. )   . Cholecalciferol (VITAMIN D) 50 MCG (2000 UT) tablet Take 2,000 Units by mouth daily.   . Chromium 1000 MCG TABS Take 1,000 mcg by mouth daily.    . Cinnamon 500 MG capsule Take 500 mg by mouth daily.   . hydrALAZINE (APRESOLINE) 50 MG tablet TAKE 1 TABLET BY MOUTH  TWICE DAILY (Patient taking differently: Take 50 mg  by mouth 2 (two) times daily. )   . insulin lispro protamine-lispro (HUMALOG 75/25 MIX) (75-25) 100 UNIT/ML SUSP injection Inject 30-40 Units into the skin daily before supper.   . latanoprost (XALATAN) 0.005 % ophthalmic solution Place 1 drop into both eyes at bedtime.    [name] nystatin (NYSTATIN) powder Apply topically 3 (three) times daily as needed. As needed (Patient taking differently: Apply 1 g topically 3 (three) times daily as needed (yeast). )   . Omega-3 Fatty Acids (FISH OIL) 500 MG CAPS Take 500 mg by mouth daily.   . pravastatin (PRAVACHOL) 40 MG tablet TAKE 1 TABLET BY MOUTH  EVERY DAY (Patient taking differently: Take 40 mg by mouth at bedtime. )   . spironolactone (ALDACTONE) 50 MG tablet Take 50 mg by mouth daily.   . TRULICITY 1.5 LN/9.8XQ SOPN INJECT 1.5 MG INTO THE SKIN ONCE A WEEK. (Patient taking differently: Inject 1.5 mg as directed every Wednesday. )   . valsartan-hydrochlorothiazide (DIOVAN-HCT) 160-25 MG tablet TAKE 1 TABLET BY MOUTH  DAILY    No facility-administered encounter medications on file as of 06/03/2019.     Objective:   Goals Addressed            This Visit's Progress     Patient Stated   . I would like to manage my chronic health conditions (pt-stated)       Current Barriers:  [name] Knowledge Deficits related to management of chronic  conditions  Pharmacist Clinical Goal(s):  [name] Over the next 90 days, patient will work with CCM team & PCP to address needs related to optimized medication management of chronic conditions  Interventions: . Comprehensive medication review performed.  Reviewed medication fill history via insurance claims data confirming patient appears compliant with having her medications filled on time as prescribed by provider. . Reviewed & discussed the following diabetes-related information with patient: o Continue checking blood sugars as directed o Follow ADA recommended "diabetes-friendly" diet  (reviewed healthy snack/food options)  o Confirmed current DM regimen: Trulicity 1.5 mg weekly, 70/30 insulin-30 units qHS - Phone number provided for patient to call in refills from [employer] patient assistance program [phone]) o Discussed insulin/GLP-1 injection technique; Patient uses AccuCheck Aviva glucometer. o Reviewed medication purpose/side effects-->patient denies adverse events, denies hypoglycemia, reports FBG 100-120s, at night after dinner it maxes out at 150-180, she then injects her insulin at bedtime. o Most recent A1c is 7.0% on 02/10/19 (last A1c was 6.7% on 09/30/18)--patient states she has been unable to participate in the [facility] water aerobics classes due to [place].  She is trying to get motivated again. o Patient reports taking aspirin and statin.  Pravastatin filled 03/01/19 for #90 o Continue taking all medications as prescribed by provider.  NO changes to current medication list. . For blood pressure-->reviewed medication regimen: carvedilol, hydralazine, spironolactone (reduced to 25mg  per cards due to hyperkalemia; Recent EF improved to 55%) . Will continue to follow.  Patient Self Care Activities:  . Self administers medications as prescribed . Attends all scheduled provider appointments . Calls pharmacy for medication refills . Performs ADL's independently . Performs IADL's independently . Calls provider office for new concerns or questions  Please see past updates related to this goal by clicking on the "Past Updates" button in the selected goal         Plan:   The care management team will reach out to the patient again over the next 60 days.   Provider Signature Regina Eck, PharmD, BCPS Clinical Pharmacist, [facility] [place]: [phone]

## 2019-06-06 NOTE — Patient Instructions (Signed)
Visit Information  Goals Addressed            This Visit's Progress     Patient Stated   . I would like to manage my chronic health conditions (pt-stated)       Current Barriers:  [name] Knowledge Deficits related to management of chronic conditions  Pharmacist Clinical Goal(s):  [name] Over the next 90 days, patient will work with CCM team & PCP to address needs related to optimized medication management of chronic conditions  Interventions: . Comprehensive medication review performed.  Reviewed medication fill history via insurance claims data confirming patient appears compliant with having her medications filled on time as prescribed by provider. . Reviewed & discussed the following diabetes-related information with patient: o Continue checking blood sugars as directed o Follow ADA recommended "diabetes-friendly" diet  (reviewed healthy snack/food options) o Confirmed current DM regimen: Trulicity 1.5 mg weekly, 70/30 insulin-30 units qHS - Phone number provided for patient to call in refills from [employer] patient assistance program [phone]) o Discussed insulin/GLP-1 injection technique; Patient uses AccuCheck Aviva glucometer. o Reviewed medication purpose/side effects-->patient denies adverse events, denies hypoglycemia, reports FBG 100-120s, at night after dinner it maxes out at 150-180, she then injects her insulin at bedtime. o Most recent A1c is 7.0% on 02/10/19 (last A1c was 6.7% on 09/30/18)--patient states she has been unable to participate in the [facility] water aerobics classes due to [place].  She is trying to get motivated again. o Patient reports taking aspirin and statin.  Pravastatin filled 03/01/19 for #90 o Continue taking all medications as prescribed by provider.  NO changes to current medication list. . For blood pressure-->reviewed medication regimen: carvedilol, hydralazine, spironolactone (reduced to 25mg  per cards due to hyperkalemia; Recent EF improved to  55%) . Will continue to follow.  Patient Self Care Activities:  . Self administers medications as prescribed . Attends all scheduled provider appointments . Calls pharmacy for medication refills . Performs ADL's independently . Performs IADL's independently . Calls provider office for new concerns or questions  Please see past updates related to this goal by clicking on the "Past Updates" button in the selected goal         The patient verbalized understanding of instructions provided today and declined a print copy of patient instruction materials.   The care management team will reach out to the patient again over the next 60 days.   SIGNATURE Regina Eck, PharmD, BCPS Clinical Pharmacist, [facility] [place]: [phone]

## 2019-06-16 ENCOUNTER — Ambulatory Visit (INDEPENDENT_AMBULATORY_CARE_PROVIDER_SITE_OTHER): Payer: Medicare Other | Admitting: Internal Medicine

## 2019-06-16 ENCOUNTER — Other Ambulatory Visit: Payer: Self-pay

## 2019-06-16 ENCOUNTER — Ambulatory Visit (INDEPENDENT_AMBULATORY_CARE_PROVIDER_SITE_OTHER): Payer: Medicare Other

## 2019-06-16 ENCOUNTER — Encounter: Payer: Self-pay | Admitting: Internal Medicine

## 2019-06-16 VITALS — BP 116/78 | HR 77 | Temp 98.1°F | Ht 63.0 in | Wt 264.4 lb

## 2019-06-16 DIAGNOSIS — E1122 Type 2 diabetes mellitus with diabetic chronic kidney disease: Secondary | ICD-10-CM | POA: Diagnosis not present

## 2019-06-16 DIAGNOSIS — N183 Chronic kidney disease, stage 3 unspecified: Secondary | ICD-10-CM

## 2019-06-16 DIAGNOSIS — Z6841 Body Mass Index (BMI) 40.0 and over, adult: Secondary | ICD-10-CM

## 2019-06-16 DIAGNOSIS — I130 Hypertensive heart and chronic kidney disease with heart failure and stage 1 through stage 4 chronic kidney disease, or unspecified chronic kidney disease: Secondary | ICD-10-CM

## 2019-06-16 DIAGNOSIS — Z794 Long term (current) use of insulin: Secondary | ICD-10-CM | POA: Diagnosis not present

## 2019-06-16 DIAGNOSIS — Z0000 Encounter for general adult medical examination without abnormal findings: Secondary | ICD-10-CM | POA: Diagnosis not present

## 2019-06-16 DIAGNOSIS — E661 Drug-induced obesity: Secondary | ICD-10-CM

## 2019-06-16 DIAGNOSIS — I5042 Chronic combined systolic (congestive) and diastolic (congestive) heart failure: Secondary | ICD-10-CM | POA: Diagnosis not present

## 2019-06-16 NOTE — Patient Instructions (Signed)
Yvonne Miller , Thank you for taking time to come for your Medicare Wellness Visit. I appreciate your ongoing commitment to your health goals. Please review the following plan we discussed and let me know if I can assist you in the future.   Screening recommendations/referrals: Colonoscopy: 02/2019 Mammogram: 12/2018 Bone Density: 09/2017 Recommended yearly ophthalmology/optometry visit for glaucoma screening and checkup Recommended yearly dental visit for hygiene and checkup  Vaccinations: Influenza vaccine: 02/2019 Pneumococcal vaccine: postponed Tdap vaccine: postponed Shingles vaccine: discussed    Advanced directives: copy in chart  Conditions/risks identified: obesity  Next appointment: 10/05/2019 at 9:30   Preventive Care 24 Years and Older, Female Preventive care refers to lifestyle choices and visits with your health care provider that can promote health and wellness. What does preventive care include?  A yearly physical exam. This is also called an annual well check.  Dental exams once or twice a year.  Routine eye exams. Ask your health care provider how often you should have your eyes checked.  Personal lifestyle choices, including:  Daily care of your teeth and gums.  Regular physical activity.  Eating a healthy diet.  Avoiding tobacco and drug use.  Limiting alcohol use.  Practicing safe sex.  Taking low-dose aspirin every day.  Taking vitamin and mineral supplements as recommended by your health care provider. What happens during an annual well check? The services and screenings done by your health care provider during your annual well check will depend on your age, overall health, lifestyle risk factors, and family history of disease. Counseling  Your health care provider may ask you questions about your:  Alcohol use.  Tobacco use.  Drug use.  Emotional well-being.  Home and relationship well-being.  Sexual activity.  Eating  habits.  History of falls.  Memory and ability to understand (cognition).  Work and work Statistician.  Reproductive health. Screening  You may have the following tests or measurements:  Height, weight, and BMI.  Blood pressure.  Lipid and cholesterol levels. These may be checked every 5 years, or more frequently if you are over 77 years old.  Skin check.  Lung cancer screening. You may have this screening every year starting at age 67 if you have a 30-pack-year history of smoking and currently smoke or have quit within the past 15 years.  Fecal occult blood test (FOBT) of the stool. You may have this test every year starting at age 59.  Flexible sigmoidoscopy or colonoscopy. You may have a sigmoidoscopy every 5 years or a colonoscopy every 10 years starting at age 61.  Hepatitis C blood test.  Hepatitis B blood test.  Sexually transmitted disease (STD) testing.  Diabetes screening. This is done by checking your blood sugar (glucose) after you have not eaten for a while (fasting). You may have this done every 1-3 years.  Bone density scan. This is done to screen for osteoporosis. You may have this done starting at age 23.  Mammogram. This may be done every 1-2 years. Talk to your health care provider about how often you should have regular mammograms. Talk with your health care provider about your test results, treatment options, and if necessary, the need for more tests. Vaccines  Your health care provider may recommend certain vaccines, such as:  Influenza vaccine. This is recommended every year.  Tetanus, diphtheria, and acellular pertussis (Tdap, Td) vaccine. You may need a Td booster every 10 years.  Zoster vaccine. You may need this after age 21.  Pneumococcal 13-valent  conjugate (PCV13) vaccine. One dose is recommended after age 41.  Pneumococcal polysaccharide (PPSV23) vaccine. One dose is recommended after age 56. Talk to your health care provider about which  screenings and vaccines you need and how often you need them. This information is not intended to replace advice given to you by your health care provider. Make sure you discuss any questions you have with your health care provider. Document Released: 05/18/2015 Document Revised: 01/09/2016 Document Reviewed: 02/20/2015 Elsevier Interactive Patient Education  2017 [place] Prevention in the Home Falls can cause injuries. They can happen to people of all ages. There are many things you can do to make your home safe and to help prevent falls. What can I do on the outside of my home?  Regularly fix the edges of walkways and driveways and fix any cracks.  Remove anything that might make you trip as you walk through a door, such as a raised step or threshold.  Trim any bushes or trees on the path to your home.  Use bright outdoor lighting.  Clear any walking paths of anything that might make someone trip, such as rocks or tools.  Regularly check to see if handrails are loose or broken. Make sure that both sides of any steps have handrails.  Any raised decks and porches should have guardrails on the edges.  Have any leaves, snow, or ice cleared regularly.  Use sand or salt on walking paths during winter.  Clean up any spills in your garage right away. This includes oil or grease spills. What can I do in the bathroom?  Use night lights.  Install grab bars by the toilet and in the tub and shower. Do not use towel bars as grab bars.  Use non-skid mats or decals in the tub or shower.  If you need to sit down in the shower, use a plastic, non-slip stool.  Keep the floor dry. Clean up any water that spills on the floor as soon as it happens.  Remove soap buildup in the tub or shower regularly.  Attach bath mats securely with double-sided non-slip rug tape.  Do not have throw rugs and other things on the floor that can make you trip. What can I do in the bedroom?  Use  night lights.  Make sure that you have a light by your bed that is easy to reach.  Do not use any sheets or blankets that are too big for your bed. They should not hang down onto the floor.  Have a firm chair that has side arms. You can use this for support while you get dressed.  Do not have throw rugs and other things on the floor that can make you trip. What can I do in the kitchen?  Clean up any spills right away.  Avoid walking on wet floors.  Keep items that you use a lot in easy-to-reach places.  If you need to reach something above you, use a strong step stool that has a grab bar.  Keep electrical cords out of the way.  Do not use floor polish or wax that makes floors slippery. If you must use wax, use non-skid floor wax.  Do not have throw rugs and other things on the floor that can make you trip. What can I do with my stairs?  Do not leave any items on the stairs.  Make sure that there are handrails on both sides of the stairs and use them. Fix handrails  that are broken or loose. Make sure that handrails are as long as the stairways.  Check any carpeting to make sure that it is firmly attached to the stairs. Fix any carpet that is loose or worn.  Avoid having throw rugs at the top or bottom of the stairs. If you do have throw rugs, attach them to the floor with carpet tape.  Make sure that you have a light switch at the top of the stairs and the bottom of the stairs. If you do not have them, ask someone to add them for you. What else can I do to help prevent falls?  Wear shoes that:  Do not have high heels.  Have rubber bottoms.  Are comfortable and fit you well.  Are closed at the toe. Do not wear sandals.  If you use a stepladder:  Make sure that it is fully opened. Do not climb a closed stepladder.  Make sure that both sides of the stepladder are locked into place.  Ask someone to hold it for you, if possible.  Clearly mark and make sure that you  can see:  Any grab bars or handrails.  First and last steps.  Where the edge of each step is.  Use tools that help you move around (mobility aids) if they are needed. These include:  Canes.  Walkers.  Scooters.  Crutches.  Turn on the lights when you go into a dark area. Replace any light bulbs as soon as they burn out.  Set up your furniture so you have a clear path. Avoid moving your furniture around.  If any of your floors are uneven, fix them.  If there are any pets around you, be aware of where they are.  Review your medicines with your doctor. Some medicines can make you feel dizzy. This can increase your chance of falling. Ask your doctor what other things that you can do to help prevent falls. This information is not intended to replace advice given to you by your health care provider. Make sure you discuss any questions you have with your health care provider. Document Released: 02/15/2009 Document Revised: 09/27/2015 Document Reviewed: 05/26/2014 Elsevier Interactive Patient Education  2017 [employer].

## 2019-06-16 NOTE — Progress Notes (Signed)
This visit occurred during the SARS-CoV-2 public health emergency.  Safety protocols were in place, including screening questions prior to the visit, additional usage of staff PPE, and extensive cleaning of exam room while observing appropriate contact time as indicated for disinfecting solutions.  Subjective:     Patient ID: Yvonne Miller , female    DOB: 08/02/1950 , 69 y.o.   MRN: [id number]   Chief Complaint  Patient presents with  . Diabetes  . Hypertension    HPI     Diabetes She presents for her follow-up diabetic visit. She has type 2 diabetes mellitus. There are no hypoglycemic associated symptoms. Pertinent negatives for diabetes include no blurred vision and no chest pain. There are no hypoglycemic complications. Risk factors for coronary artery disease include diabetes mellitus, dyslipidemia, hypertension, post-menopausal, sedentary lifestyle and obesity. Current diabetic treatment includes insulin injections. She is compliant with treatment some of the time. She is following a diabetic diet. She participates in exercise intermittently.  Hypertension This is a chronic problem. The current episode started more than 1 year ago. The problem has been gradually improving since onset. The problem is controlled. Pertinent negatives include no blurred vision, chest pain, palpitations or shortness of breath. The current treatment provides moderate improvement. Compliance problems include exercise.  Hypertensive end-organ damage includes kidney disease and heart failure.     Past Medical History:  Diagnosis Date  . AICD (automatic cardioverter/defibrillator) present   . Arthritis   . Asthma   . Carpal tunnel syndrome, bilateral   . CKD (chronic kidney disease)   . Diabetes mellitus   . Encounter for assessment of implantable cardioverter-defibrillator (ICD)   . GERD (gastroesophageal reflux disease)   . Hyperlipemia   . Hypertension   . Obesity    morbid  . Sleep apnea    wears CPAP set at 12  . Wears glasses      Family History  Problem Relation Age of Onset  . Hypertension Mother   . Stroke Mother   . Heart disease Father   . Heart attack Sister   . Heart attack Brother      Current Outpatient Medications:  .  ACCU-CHEK AVIVA PLUS test strip, CHECK 4 TIMES BY INTRADERMAL ROUTE EVERY DAY, Disp: 200 strip, Rfl: 11 .  albuterol (PROVENTIL HFA;VENTOLIN HFA) 108 (90 Base) MCG/ACT inhaler, Inhale 2 puffs into the lungs every 4 (four) hours as needed for wheezing or shortness of breath. , Disp: , Rfl:  .  amLODipine (NORVASC) 5 MG tablet, TAKE 1 TABLET BY MOUTH  DAILY (Patient taking differently: Take 5 mg by mouth daily. ), Disp: 90 tablet, Rfl: 3 .  aspirin EC 81 MG tablet, Take 81 mg by mouth at bedtime. , Disp: , Rfl:  .  B Complex Vitamins (B COMPLEX-B12) TABS, Take 1 tablet by mouth daily., Disp: , Rfl:  .  carvedilol (COREG) 25 MG tablet, TAKE 1 AND 1/2 TABLETS BY  MOUTH TWO TIMES DAILY (Patient taking differently: Take 37.5 mg by mouth 2 (two) times daily. ), Disp: 270 tablet, Rfl: 3 .  Cholecalciferol (VITAMIN D) 50 MCG (2000 UT) tablet, Take 2,000 Units by mouth daily., Disp: , Rfl:  .  Chromium 1000 MCG TABS, Take 1,000 mcg by mouth daily. , Disp: , Rfl:  .  Cinnamon 500 MG capsule, Take 500 mg by mouth daily., Disp: , Rfl:  .  fluticasone (FLONASE) 50 MCG/ACT nasal spray, Place 1 spray into both nostrils daily. (Patient taking differently: Place 1  spray into both nostrils as needed for allergies. ), Disp: 16 g, Rfl: 2 .  hydrALAZINE (APRESOLINE) 50 MG tablet, TAKE 1 TABLET BY MOUTH  TWICE DAILY (Patient taking differently: Take 50 mg by mouth 2 (two) times daily. ), Disp: 180 tablet, Rfl: 3 .  insulin lispro protamine-lispro (HUMALOG 75/25 MIX) (75-25) 100 UNIT/ML SUSP injection, Inject 30-40 Units into the skin daily before supper., Disp: , Rfl:  .  latanoprost (XALATAN) 0.005 % ophthalmic solution, Place 1 drop into both eyes at bedtime. , Disp: ,  Rfl:  .  nystatin (NYSTATIN) powder, Apply topically 3 (three) times daily as needed. As needed (Patient taking differently: Apply 1 g topically 3 (three) times daily as needed (yeast). ), Disp: 60 g, Rfl: 2 .  Omega-3 Fatty Acids (FISH OIL) 500 MG CAPS, Take 500 mg by mouth daily., Disp: , Rfl:  .  pravastatin (PRAVACHOL) 40 MG tablet, TAKE 1 TABLET BY MOUTH  EVERY DAY (Patient taking differently: Take 40 mg by mouth at bedtime. ), Disp: 90 tablet, Rfl: 3 .  spironolactone (ALDACTONE) 50 MG tablet, Take 50 mg by mouth daily., Disp: , Rfl:  .  TRULICITY 1.5 IH/0.3UU SOPN, INJECT 1.5 MG INTO THE SKIN ONCE A WEEK. (Patient taking differently: Inject 1.5 mg as directed every Wednesday. ), Disp: 2 pen, Rfl: 2 .  valsartan-hydrochlorothiazide (DIOVAN-HCT) 160-25 MG tablet, TAKE 1 TABLET BY MOUTH  DAILY, Disp: 90 tablet, Rfl: 3   Allergies  Allergen Reactions  . Other Other (See Comments)    NO BLOOD   . Jehovah witness     Review of Systems  Constitutional: Negative.   Eyes: Negative for blurred vision.  Respiratory: Negative.  Negative for shortness of breath.   Cardiovascular: Negative.  Negative for chest pain and palpitations.  Gastrointestinal: Negative.   Neurological: Negative.   Psychiatric/Behavioral: Negative.      Today's Vitals   06/16/19 0840  BP: 116/78  Pulse: 77  Temp: 98.1 F (36.7 C)  TempSrc: Oral  Weight: 264 lb 6.4 oz (119.9 kg)  Height: '5\' 3"'$  (1.6 m)  PainSc: 0-No pain   Body mass index is 46.84 kg/m.   Objective:  Physical Exam Vitals and nursing note reviewed.  Constitutional:      Appearance: Normal appearance. She is obese.  HENT:     Head: Normocephalic and atraumatic.  Cardiovascular:     Rate and Rhythm: Normal rate and regular rhythm.     Heart sounds: Normal heart sounds.  Pulmonary:     Effort: Pulmonary effort is normal.     Breath sounds: Normal breath sounds.  Skin:    General: Skin is warm.  Neurological:     General: No focal  deficit present.     Mental Status: She is alert.  Psychiatric:        Mood and Affect: Mood normal.        Behavior: Behavior normal.         Assessment And Plan:     1. Type 2 diabetes mellitus with stage 3 chronic kidney disease, with long-term current use of insulin, unspecified whether stage 3a or 3b CKD (HCC)  Chronic, yet stable.  I will check labs as listed below. She is encouraged to strive for at least 150 minutes of exercise per week.   - CMP14+EGFR - Lipid panel - Hemoglobin A1c  2. Hypertensive heart and renal disease with renal failure, stage 1 through stage 4 or unspecified chronic kidney disease, with heart failure (  HCC)  Chronic, well controlled. She is encouraged to avoid adding salt to her foods. Also encouraged to limit her salt intake.   3. Chronic combined systolic and diastolic congestive heart failure (HCC)  Chronic, yet stable. Importance of regular activity was discussed with the patient.   4. Class 3 drug-induced obesity with serious comorbidity and body mass index (BMI) of 45.0 to 49.9 in adult (HCC)  BMI 46. She is aware of 13 pound weight gain and encouraged to stop eating at 1130pm. She is also encouraged to walk her driveway if weather is 50 degrees or above. She agrees to incorporate more activity into her daily routine.   Wt Readings from Last 3 Encounters:  06/16/19 264 lb 6.4 oz (119.9 kg)  03/01/19 251 lb 1.7 oz (113.9 kg)  02/18/19 251 lb (113.9 kg)     Maximino Greenland, MD    THE PATIENT IS ENCOURAGED TO PRACTICE SOCIAL DISTANCING DUE TO THE COVID-19 PANDEMIC.

## 2019-06-16 NOTE — Patient Instructions (Signed)

## 2019-06-16 NOTE — Progress Notes (Signed)
This visit occurred during the SARS-CoV-2 public health emergency.  Safety protocols were in place, including screening questions prior to the visit, additional usage of staff PPE, and extensive cleaning of exam room while observing appropriate contact time as indicated for disinfecting solutions.  Subjective:   Yvonne Miller is a 69 y.o. female who presents for Medicare Annual (Subsequent) preventive examination.  Review of Systems:  n/a Cardiac Risk Factors include: advanced age (>47men, >20 women);diabetes mellitus;dyslipidemia;hypertension;obesity (BMI >30kg/m2);sedentary lifestyle     Objective:     Vitals: BP 116/78   Pulse 77   Temp 98.1 F (36.7 C) (Oral)   Ht 5\' 3"  (1.6 m)   Wt 264 lb 6.4 oz (119.9 kg)   BMI 46.84 kg/m   Body mass index is 46.84 kg/m.  Advanced Directives 06/16/2019 03/01/2019 09/30/2018 03/19/2018 03/12/2018 03/03/2018 01/26/2018  Does Patient Have a Medical Advance Directive? Yes - Yes Yes Yes Yes No  Type of Advance Directive [place];Living will [place];Living will [place];Living will Healthcare Power of [place]  Does patient want to make changes to medical advance directive? - - - - No - Patient declined No - Patient declined No - Patient declined  Copy of [place] in Chart? Yes - validated most recent copy scanned in chart (See row information) Yes - validated most recent copy scanned in chart (See row information) Yes - validated most recent copy scanned in chart (See row information) Yes - validated most recent copy scanned in chart (See row information) Yes - validated most recent copy scanned in chart (See row information) - No - copy requested  Would patient like information on creating a medical advance directive? - - - - - - No - Patient declined    Tobacco Social History   Tobacco Use  Smoking Status  Never Smoker  Smokeless Tobacco Never Used     Counseling given: Not Answered   Clinical Intake:  Pre-visit preparation completed: Yes  Pain : No/denies pain     Nutritional Status: BMI > 30  Obese Nutritional Risks: None Diabetes: Yes  How often do you need to have someone help you when you read instructions, pamphlets, or other written materials from your doctor or pharmacy?: 1 - Never What is the last grade level you completed in school?: BA graduate  Interpreter Needed?: No  Information entered by :: NAllen LPN  Past Medical History:  Diagnosis Date  . AICD (automatic cardioverter/defibrillator) present   . Arthritis   . Asthma   . Carpal tunnel syndrome, bilateral   . CKD (chronic kidney disease)   . Diabetes mellitus   . Encounter for assessment of implantable cardioverter-defibrillator (ICD)   . GERD (gastroesophageal reflux disease)   . Hyperlipemia   . Hypertension   . Obesity    morbid  . Sleep apnea    wears CPAP set at 11  . Wears glasses    Past Surgical History:  Procedure Laterality Date  . ABDOMINAL HYSTERECTOMY    . BIOPSY  03/01/2019   Procedure: BIOPSY;  Surgeon: Juanita Craver, MD;  Location: [facility];  Service: Endoscopy;;  . BREAST SURGERY  1968   breast mass excision  . CARPAL TUNNEL RELEASE Left 07/10/2017   Procedure: CARPAL TUNNEL RELEASE;  Surgeon: Ashok Pall, MD;  Location: [facility];  Service: Neurosurgery;  Laterality: Left;  left  . CARPAL TUNNEL RELEASE Right 03/19/2018  Procedure: RIGHT CARPAL TUNNEL RELEASE;  Surgeon: Ashok Pall, MD;  Location: [facility];  Service: Neurosurgery;  Laterality: Right;  right  . CERVICAL SPINE SURGERY  2006  . CHOLECYSTECTOMY    . COLONOSCOPY WITH PROPOFOL N/A 03/01/2019   Procedure: COLONOSCOPY WITH PROPOFOL;  Surgeon: Juanita Craver, MD;  Location: [facility];  Service: Endoscopy;  Laterality: N/A;  . DILATION AND CURETTAGE OF UTERUS    . ICD IMPLANT    . JOINT REPLACEMENT    . TONSILLECTOMY   1961  . TOTAL KNEE ARTHROPLASTY Left 01/26/2018   Procedure: LEFT TOTAL KNEE ARTHROPLASTY;  Surgeon: Mcarthur Rossetti, MD;  Location: [facility];  Service: Orthopedics;  Laterality: Left;   Family History  Problem Relation Age of Onset  . Hypertension Mother   . Stroke Mother   . Heart disease Father   . Heart attack Sister   . Heart attack Brother    Social History   Socioeconomic History  . Marital status: Married    Spouse name: Not on file  . Number of children: 2  . Years of education: Not on file  . Highest education level: Not on file  Occupational History  . Occupation: retired  Tobacco Use  . Smoking status: Never Smoker  . Smokeless tobacco: Never Used  Substance and Sexual Activity  . Alcohol use: No  . Drug use: No  . Sexual activity: Yes  Other Topics Concern  . Not on file  Social History Narrative  . Not on file   Social Determinants of Health   Financial Resource Strain: Low Risk   . Difficulty of Paying Living Expenses: Not hard at all  Food Insecurity: No Food Insecurity  . Worried About Charity fundraiser in the Last Year: Never true  . Ran Out of Food in the Last Year: Never true  Transportation Needs: No Transportation Needs  . Lack of Transportation (Medical): No  . Lack of Transportation (Non-Medical): No  Physical Activity: Inactive  . Days of Exercise per Week: 0 days  . Minutes of Exercise per Session: 0 min  Stress: No Stress Concern Present  . Feeling of Stress : Not at all  Social Connections:   . Frequency of Communication with Friends and Family: Not on file  . Frequency of Social Gatherings with Friends and Family: Not on file  . Attends Religious Services: Not on file  . Active Member of Clubs or Organizations: Not on file  . Attends Archivist Meetings: Not on file  . Marital Status: Not on file    Outpatient Encounter Medications as of 06/16/2019  Medication Sig  . ACCU-CHEK AVIVA PLUS test strip CHECK 4 TIMES  BY INTRADERMAL ROUTE EVERY DAY  . albuterol (PROVENTIL HFA;VENTOLIN HFA) 108 (90 Base) MCG/ACT inhaler Inhale 2 puffs into the lungs every 4 (four) hours as needed for wheezing or shortness of breath.   [name] amLODipine (NORVASC) 5 MG tablet TAKE 1 TABLET BY MOUTH  DAILY (Patient taking differently: Take 5 mg by mouth daily. )  . aspirin EC 81 MG tablet Take 81 mg by mouth at bedtime.   . B Complex Vitamins (B COMPLEX-B12) TABS Take 1 tablet by mouth daily.  . carvedilol (COREG) 25 MG tablet TAKE 1 AND 1/2 TABLETS BY  MOUTH TWO TIMES DAILY (Patient taking differently: Take 37.5 mg by mouth 2 (two) times daily. )  . Cholecalciferol (VITAMIN D) 50 MCG (2000 UT) tablet Take 2,000 Units by mouth daily.  . Chromium  1000 MCG TABS Take 1,000 mcg by mouth daily.   . Cinnamon 500 MG capsule Take 500 mg by mouth daily.  . fluticasone (FLONASE) 50 MCG/ACT nasal spray Place 1 spray into both nostrils daily. (Patient taking differently: Place 1 spray into both nostrils as needed for allergies. )  . hydrALAZINE (APRESOLINE) 50 MG tablet TAKE 1 TABLET BY MOUTH  TWICE DAILY (Patient taking differently: Take 50 mg by mouth 2 (two) times daily. )  . insulin lispro protamine-lispro (HUMALOG 75/25 MIX) (75-25) 100 UNIT/ML SUSP injection Inject 30-40 Units into the skin daily before supper.  . latanoprost (XALATAN) 0.005 % ophthalmic solution Place 1 drop into both eyes at bedtime.   [name] nystatin (NYSTATIN) powder Apply topically 3 (three) times daily as needed. As needed (Patient taking differently: Apply 1 g topically 3 (three) times daily as needed (yeast). )  . Omega-3 Fatty Acids (FISH OIL) 500 MG CAPS Take 500 mg by mouth daily.  . pravastatin (PRAVACHOL) 40 MG tablet TAKE 1 TABLET BY MOUTH  EVERY DAY (Patient taking differently: Take 40 mg by mouth at bedtime. )  . spironolactone (ALDACTONE) 50 MG tablet Take 50 mg by mouth daily.  . TRULICITY 1.5 HQ/4.6NG SOPN INJECT 1.5 MG INTO THE SKIN ONCE A WEEK. (Patient taking  differently: Inject 1.5 mg as directed every Wednesday. )  . valsartan-hydrochlorothiazide (DIOVAN-HCT) 160-25 MG tablet TAKE 1 TABLET BY MOUTH  DAILY   No facility-administered encounter medications on file as of 06/16/2019.    Activities of Daily Living In your present state of health, do you have any difficulty performing the following activities: 06/16/2019 09/30/2018  Hearing? N N  Vision? N N  Difficulty concentrating or making decisions? N N  Walking or climbing stairs? Y N  Comment due to knee replacements -  Dressing or bathing? N N  Doing errands, shopping? N N  Preparing Food and eating ? N N  Using the Toilet? N N  In the past six months, have you accidently leaked urine? N N  Do you have problems with loss of bowel control? N N  Managing your Medications? N N  Managing your Finances? N N  Housekeeping or managing your Housekeeping? N N  Some recent data might be hidden    Patient Care Team: [name], MD as PCP - General (Internal Medicine) Lavera Guise, [facility] (Pharmacist) Rex Kras Claudette Stapler, RN as Case Manager    Assessment:   This is a routine wellness examination for [place].  Exercise Activities and Dietary recommendations Current Exercise Habits: The patient does not participate in regular exercise at present  Goals    . Assist with Chronic Care Management and Community Resources     Current Barriers:  [name] Knowledge Barriers related to resources and support available to address needs related to Chronic Care Management and Community Resources  Case Manager Clinical Goal(s):  [name] Over the next 30 days, patient will work with the CCM team to address needs related to Chronic Care Management, Medication management and pharmacy resources to assist with med cost and [employer].   Interventions:  . Collaborated with embedded Pharm D and initiated plan of care to address needs related to Chronic Care Management, Medication management and pharmacy resources to  assist with med cost and Community Resources  Patient Self Care Activities:  . Self administers medications as prescribed . Attends all scheduled provider appointments . Calls pharmacy for medication refills . Performs ADL's independently . Calls provider office for new concerns  or questions  Initial goal documentation     . I would like to apply for medication assistance for my diabetes medications (pt-stated)     Current Barriers:  . Financial Barriers  Pharmacist Clinical Goal(s):  [name] Over the next 30 days, patient will work with PharmD to address needs related to applying for medication assistance through [place] for diabetes medications  goal re-established on 04/07/19 for PAP process 2021  Interventions: . Comprehensive medication review performed. . Collaboration with [facility] CPhT Etter Sjogren who will assist with application process through [facility] 2021 . Patient requests to be on Trulicity pens and Humalog 70/30 Kwikpen.  She states financial assistance will allow her to stay on track with her diabetes and she will not have to ration her medication.  She appears stable on this regimen, however we will continue to assess it.  At the time of approval, patient will have medication shipped directly to her home. . Patient is currently injecting 30 units of insulin (70/30) at bedtime and she is on Trulicity 1.5mg  weekly.  Patient Self Care Activities:  . Self administers medications as prescribed . Attends all scheduled provider appointments . Calls pharmacy for medication refills . Performs ADL's independently . Calls provider office for new concerns or questions  Initial goal documentation     . I would like to manage my chronic health conditions (pt-stated)     Current Barriers:  [name] Knowledge Deficits related to management of chronic conditions  Pharmacist Clinical Goal(s):  [name] Over the next 90 days, patient will work with CCM team & PCP to address needs related to optimized  medication management of chronic conditions  Interventions: . Comprehensive medication review performed.  Reviewed medication fill history via insurance claims data confirming patient appears compliant with having her medications filled on time as prescribed by provider. . Reviewed & discussed the following diabetes-related information with patient: o Continue checking blood sugars as directed o Follow ADA recommended "diabetes-friendly" diet  (reviewed healthy snack/food options) o Confirmed current DM regimen: Trulicity 1.5 mg weekly, 70/30 insulin-30 units qHS - Phone number provided for patient to call in refills from [employer] patient assistance program [phone]) o Discussed insulin/GLP-1 injection technique; Patient uses AccuCheck Aviva glucometer. o Reviewed medication purpose/side effects-->patient denies adverse events, denies hypoglycemia, reports FBG 100-120s, at night after dinner it maxes out at 150-180, she then injects her insulin at bedtime. o Most recent A1c is 7.0% on 02/10/19 (last A1c was 6.7% on 09/30/18)--patient states she has been unable to participate in the [facility] water aerobics classes due to [place].  She is trying to get motivated again. o Patient reports taking aspirin and statin.  Pravastatin filled 03/01/19 for #90 o Continue taking all medications as prescribed by provider.  NO changes to current medication list. . For blood pressure-->reviewed medication regimen: carvedilol, hydralazine, spironolactone (reduced to 25mg  per cards due to hyperkalemia; Recent EF improved to 55%) . Will continue to follow.  Patient Self Care Activities:  . Self administers medications as prescribed . Attends all scheduled provider appointments . Calls pharmacy for medication refills . Performs ADL's independently . Performs IADL's independently . Calls provider office for new concerns or questions  Please see past updates related to this goal by clicking on the "Past Updates"  button in the selected goal      . Weight (lb) < 200 lb (90.7 kg)    . Weight (lb) < 200 lb (90.7 kg)     06/16/2019, wants to lose  13 pounds by stop eating at night       Fall Risk Fall Risk  06/16/2019 02/10/2019 09/30/2018 05/31/2018 04/14/2018  Falls in the past year? 0 0 0 0 0  Risk for fall due to : Medication side effect - Medication side effect - -  Follow up - - Falls prevention discussed;Education provided - -   Is the patient's home free of loose throw rugs in walkways, pet beds, electrical cords, etc?   yes      Grab bars in the bathroom? no      Handrails on the stairs?   yes      Adequate lighting?   yes  Timed Get Up and Go performed: n/a  Depression Screen PHQ 2/9 Scores 06/16/2019 02/10/2019 09/30/2018 05/31/2018  PHQ - 2 Score 0 0 0 0  PHQ- 9 Score 0 - 0 -     Cognitive Function     6CIT Screen 06/16/2019 09/30/2018  What Year? 0 points 0 points  What month? 0 points 0 points  What time? 0 points 0 points  Count back from 20 0 points 0 points  Months in reverse 0 points 0 points  Repeat phrase 0 points 0 points  Total Score 0 0    Immunization History  Administered Date(s) Administered  . Influenza, High Dose Seasonal PF 02/17/2016, 03/04/2018, 02/10/2019  . Influenza-Unspecified 02/02/2014, 02/16/2014  . PFIZER SARS-COV-2 Vaccination 06/11/2019  . Td 09/08/2006  . Zoster 12/15/2007    Qualifies for Shingles Vaccine? yes  Screening Tests Health Maintenance  Topic Date Due  . TETANUS/TDAP  09/30/2019 (Originally 09/07/2016)  . PNA vac Low Risk Adult (1 of 2 - PCV13) 09/30/2019 (Originally 07/22/2014)  . HEMOGLOBIN A1C  08/11/2019  . FOOT EXAM  09/30/2019  . OPHTHALMOLOGY EXAM  12/02/2019  . MAMMOGRAM  12/16/2020  . COLONOSCOPY  02/28/2029  . INFLUENZA VACCINE  Completed  . DEXA SCAN  Completed  . Hepatitis C Screening  Completed    Cancer Screenings: Lung: Low Dose CT Chest recommended if Age 39-80 years, 30 pack-year currently smoking OR have  quit w/in 15years. Patient does not qualify. Breast:  Up to date on Mammogram? Yes   Up to date of Bone Density/Dexa? Yes Colorectal: up to date  Additional Screenings: : Hepatitis C Screening: 05/28/2012     Plan:    Patient wants to lose 13 pounds by next appointment. She's going to stop eating late at night.   I have personally reviewed and noted the following in the patient's chart:   . Medical and social history . Use of alcohol, tobacco or illicit drugs  . Current medications and supplements . Functional ability and status . Nutritional status . Physical activity . Advanced directives . List of other physicians . Hospitalizations, surgeries, and ER visits in previous 12 months . Vitals . Screenings to include cognitive, depression, and falls . Referrals and appointments  In addition, I have reviewed and discussed with patient certain preventive protocols, quality metrics, and best practice recommendations. A written personalized care plan for preventive services as well as general preventive health recommendations were provided to patient.     Kellie Simmering, LPN  12/29/784

## 2019-06-17 ENCOUNTER — Other Ambulatory Visit: Payer: Self-pay

## 2019-06-17 ENCOUNTER — Telehealth: Payer: Self-pay

## 2019-06-17 LAB — LIPID PANEL
Chol/HDL Ratio: 3.6 ratio (ref 0.0–4.4)
Cholesterol, Total: 135 mg/dL (ref 100–199)
HDL: 37 mg/dL — ABNORMAL LOW (ref >39)
LDL Chol Calc (NIH): 73 mg/dL (ref 0–99)
Triglycerides: 145 mg/dL (ref 0–149)
VLDL Cholesterol Cal: 25 mg/dL (ref 5–40)

## 2019-06-17 LAB — CMP14+EGFR
ALT: 13 IU/L (ref 0–32)
AST: 14 IU/L (ref 0–40)
Albumin/Globulin Ratio: 1.4 (ref 1.2–2.2)
Albumin: 4.1 g/dL (ref 3.8–4.8)
Alkaline Phosphatase: 107 IU/L (ref 39–117)
BUN/Creatinine Ratio: 18 (ref 12–28)
BUN: 24 mg/dL (ref 8–27)
Bilirubin Total: <0.2 mg/dL (ref 0.0–1.2)
CO2: 22 mmol/L (ref 20–29)
Calcium: 9.3 mg/dL (ref 8.7–10.3)
Chloride: 106 mmol/L (ref 96–106)
Creatinine, Ser: 1.32 mg/dL — ABNORMAL HIGH (ref 0.57–1.00)
GFR calc Af Amer: 47 mL/min/{1.73_m2} — ABNORMAL LOW (ref >59)
GFR calc non Af Amer: 41 mL/min/{1.73_m2} — ABNORMAL LOW (ref >59)
Globulin, Total: 2.9 g/dL (ref 1.5–4.5)
Glucose: 129 mg/dL — ABNORMAL HIGH (ref 65–99)
Potassium: 4.8 mmol/L (ref 3.5–5.2)
Sodium: 141 mmol/L (ref 134–144)
Total Protein: 7 g/dL (ref 6.0–8.5)

## 2019-06-17 LAB — HEMOGLOBIN A1C
Est. average glucose Bld gHb Est-mCnc: 174 mg/dL
Hgb A1c MFr Bld: 7.7 % — ABNORMAL HIGH (ref 4.8–5.6)

## 2019-06-17 NOTE — Telephone Encounter (Signed)
-----   Message from [name], MD sent at 06/17/2019  8:36 AM EST ----- Here are your lab results: ' Your kidney function is stable.  Your HDL, good cholesterol, is low. This is a risk factor for heart disease. Please increase your activity level. Please start walking the driveway as we discussed during your visit. We must move now so we can move later!   Lastly, your a1c is 7.7. This is up from last visit. We know why! Please eat dinner earlier and move more! It will be better next time!  Please let me know if you have any questions or concerns. Stay safe!   Sincerely,    Robyn N. Baird Cancer, MD

## 2019-06-22 ENCOUNTER — Other Ambulatory Visit: Payer: Self-pay

## 2019-06-22 ENCOUNTER — Ambulatory Visit: Payer: Self-pay

## 2019-06-22 ENCOUNTER — Telehealth: Payer: Self-pay

## 2019-06-22 DIAGNOSIS — N183 Chronic kidney disease, stage 3 unspecified: Secondary | ICD-10-CM

## 2019-06-22 DIAGNOSIS — Z794 Long term (current) use of insulin: Secondary | ICD-10-CM

## 2019-06-22 DIAGNOSIS — I10 Essential (primary) hypertension: Secondary | ICD-10-CM

## 2019-06-22 DIAGNOSIS — E785 Hyperlipidemia, unspecified: Secondary | ICD-10-CM

## 2019-06-22 DIAGNOSIS — I130 Hypertensive heart and chronic kidney disease with heart failure and stage 1 through stage 4 chronic kidney disease, or unspecified chronic kidney disease: Secondary | ICD-10-CM

## 2019-06-22 DIAGNOSIS — E1122 Type 2 diabetes mellitus with diabetic chronic kidney disease: Secondary | ICD-10-CM

## 2019-06-22 NOTE — Chronic Care Management (AMB) (Signed)
  Chronic Care Management   Outreach Note  06/22/2019 Name: Yvonne Miller MRN: [id number] DOB: [birth year]  Referred by: [name], MD Reason for referral : Chronic Care Management (RQ New Initial Call - DM, HTN, Hyperlipidemia, CKD )   An unsuccessful telephone outreach was attempted today. The patient was referred to the case management team for assistance with care management and care coordination.   Follow Up Plan: A HIPPA compliant phone message was left for the patient providing contact information and requesting a return call.  Telephone follow up appointment with care management team member scheduled for: 07/18/19  Barb Merino, RN, BSN, CCM Care Management Coordinator [facility]  Direct Phone: [phone]

## 2019-06-28 DIAGNOSIS — H401131 Primary open-angle glaucoma, bilateral, mild stage: Secondary | ICD-10-CM | POA: Diagnosis not present

## 2019-07-13 DIAGNOSIS — R921 Mammographic calcification found on diagnostic imaging of breast: Secondary | ICD-10-CM | POA: Diagnosis not present

## 2019-07-13 LAB — HM MAMMOGRAPHY: HM Mammogram: ABNORMAL — AB (ref 0–4)

## 2019-07-14 ENCOUNTER — Encounter: Payer: Self-pay | Admitting: Internal Medicine

## 2019-07-18 ENCOUNTER — Telehealth: Payer: Self-pay

## 2019-07-18 ENCOUNTER — Other Ambulatory Visit: Payer: Self-pay

## 2019-07-18 ENCOUNTER — Ambulatory Visit: Payer: Self-pay

## 2019-07-18 DIAGNOSIS — Z794 Long term (current) use of insulin: Secondary | ICD-10-CM

## 2019-07-18 DIAGNOSIS — I130 Hypertensive heart and chronic kidney disease with heart failure and stage 1 through stage 4 chronic kidney disease, or unspecified chronic kidney disease: Secondary | ICD-10-CM

## 2019-07-18 DIAGNOSIS — I10 Essential (primary) hypertension: Secondary | ICD-10-CM

## 2019-07-18 DIAGNOSIS — E1122 Type 2 diabetes mellitus with diabetic chronic kidney disease: Secondary | ICD-10-CM

## 2019-07-18 DIAGNOSIS — N183 Chronic kidney disease, stage 3 unspecified: Secondary | ICD-10-CM

## 2019-07-19 NOTE — Chronic Care Management (AMB) (Signed)
  Chronic Care Management   Outreach Note  07/19/2019 Name: Yvonne Miller MRN: [id number] DOB: 01-06-1951  Referred by: [name], MD Reason for referral : Chronic Care Management (RQ #2 Initial Call - DM/HLD/Obesity )   A second unsuccessful telephone outreach was attempted today. The patient was referred to the case management team for assistance with care management and care coordination.   Follow Up Plan: Telephone follow up appointment with care management team member scheduled for:08/08/19  Barb Merino, RN, BSN, CCM Care Management Coordinator [place] [facility]  Direct Phone: [phone]

## 2019-07-21 DIAGNOSIS — Z4502 Encounter for adjustment and management of automatic implantable cardiac defibrillator: Secondary | ICD-10-CM | POA: Diagnosis not present

## 2019-07-21 DIAGNOSIS — I5022 Chronic systolic (congestive) heart failure: Secondary | ICD-10-CM | POA: Diagnosis not present

## 2019-07-21 DIAGNOSIS — Z95810 Presence of automatic (implantable) cardiac defibrillator: Secondary | ICD-10-CM | POA: Diagnosis not present

## 2019-07-28 DIAGNOSIS — K5730 Diverticulosis of large intestine without perforation or abscess without bleeding: Secondary | ICD-10-CM | POA: Diagnosis not present

## 2019-07-28 DIAGNOSIS — R1013 Epigastric pain: Secondary | ICD-10-CM | POA: Diagnosis not present

## 2019-07-28 DIAGNOSIS — K219 Gastro-esophageal reflux disease without esophagitis: Secondary | ICD-10-CM | POA: Diagnosis not present

## 2019-08-08 ENCOUNTER — Telehealth: Payer: Self-pay

## 2019-08-08 ENCOUNTER — Other Ambulatory Visit: Payer: Self-pay

## 2019-08-08 ENCOUNTER — Ambulatory Visit (INDEPENDENT_AMBULATORY_CARE_PROVIDER_SITE_OTHER): Payer: Medicare Other

## 2019-08-08 DIAGNOSIS — N183 Chronic kidney disease, stage 3 unspecified: Secondary | ICD-10-CM

## 2019-08-08 DIAGNOSIS — I5042 Chronic combined systolic (congestive) and diastolic (congestive) heart failure: Secondary | ICD-10-CM | POA: Diagnosis not present

## 2019-08-08 DIAGNOSIS — E785 Hyperlipidemia, unspecified: Secondary | ICD-10-CM

## 2019-08-08 DIAGNOSIS — I5032 Chronic diastolic (congestive) heart failure: Secondary | ICD-10-CM | POA: Diagnosis not present

## 2019-08-08 DIAGNOSIS — Z794 Long term (current) use of insulin: Secondary | ICD-10-CM

## 2019-08-08 DIAGNOSIS — E1122 Type 2 diabetes mellitus with diabetic chronic kidney disease: Secondary | ICD-10-CM

## 2019-08-08 DIAGNOSIS — I130 Hypertensive heart and chronic kidney disease with heart failure and stage 1 through stage 4 chronic kidney disease, or unspecified chronic kidney disease: Secondary | ICD-10-CM

## 2019-08-12 NOTE — Chronic Care Management (AMB) (Signed)
Chronic Care Management   Initial Visit Note  08/12/2019 Name: Yvonne Miller MRN: [id number] DOB: [birth year]  Referred by: Yvonne Chard, MD Reason for referral : Chronic Care Management (RQ #3 Initial RN Call-DM, CKDIII, HLD)   Yvonne Miller is a 69 y.o. year old female who is a primary care patient of Yvonne Chard, MD. The CCM team was consulted for assistance with chronic disease management and care coordination needs related to T2DM, HTN, HLD, CKD, CHF.  Review of patient status, including review of consultants reports, relevant laboratory and other test results, and collaboration with appropriate care team members and the patient's provider was performed as part of comprehensive patient evaluation and provision of chronic care management services.    SDOH (Social Determinants of Health) assessments performed: Yes See Care Plan activities for detailed interventions related to [place] outbound initial CCM RN CM call to patient to assess for CCM needs and a care plan was established.     Medications: Outpatient Encounter Medications as of 08/08/2019  Medication Sig  . albuterol (PROVENTIL HFA;VENTOLIN HFA) 108 (90 Base) MCG/ACT inhaler Inhale 2 puffs into the lungs every 4 (four) hours as needed for wheezing or shortness of breath.   [name] amLODipine (NORVASC) 5 MG tablet TAKE 1 TABLET BY MOUTH  DAILY (Patient taking differently: Take 5 mg by mouth daily. )  . aspirin EC 81 MG tablet Take 81 mg by mouth at bedtime.   . B Complex Vitamins (B COMPLEX-B12) TABS Take 1 tablet by mouth daily.  . carvedilol (COREG) 25 MG tablet TAKE 1 AND 1/2 TABLETS BY  MOUTH TWO TIMES DAILY (Patient taking differently: Take 37.5 mg by mouth 2 (two) times daily. )  . Cholecalciferol (VITAMIN D) 50 MCG (2000 UT) tablet Take 2,000 Units by mouth daily.  . Chromium 1000 MCG TABS Take 1,000 mcg by mouth daily.   . Cinnamon 500 MG capsule Take 500 mg by mouth daily.  . hydrALAZINE (APRESOLINE) 50 MG tablet  TAKE 1 TABLET BY MOUTH  TWICE DAILY (Patient taking differently: Take 50 mg by mouth 2 (two) times daily. )  . insulin lispro protamine-lispro (HUMALOG 75/25 MIX) (75-25) 100 UNIT/ML SUSP injection Inject 30-40 Units into the skin daily before supper.  . latanoprost (XALATAN) 0.005 % ophthalmic solution Place 1 drop into both eyes at bedtime.   [name] nystatin (NYSTATIN) powder Apply topically 3 (three) times daily as needed. As needed (Patient taking differently: Apply 1 g topically 3 (three) times daily as needed (yeast). )  . Omega-3 Fatty Acids (FISH OIL) 500 MG CAPS Take 500 mg by mouth daily.  [name] omeprazole (PRILOSEC) 40 MG capsule Take 40 mg by mouth daily.  . pravastatin (PRAVACHOL) 40 MG tablet TAKE 1 TABLET BY MOUTH  EVERY DAY (Patient taking differently: Take 40 mg by mouth at bedtime. )  . spironolactone (ALDACTONE) 50 MG tablet Take 50 mg by mouth daily.  . TRULICITY 1.5 NW/2.9FA SOPN INJECT 1.5 MG INTO THE SKIN ONCE A WEEK. (Patient taking differently: Inject 1.5 mg as directed every Wednesday. )  . valsartan-hydrochlorothiazide (DIOVAN-HCT) 160-25 MG tablet TAKE 1 TABLET BY MOUTH  DAILY  . ACCU-CHEK AVIVA PLUS test strip CHECK 4 TIMES BY INTRADERMAL ROUTE EVERY DAY   No facility-administered encounter medications on file as of 08/08/2019.     Objective:  Lab Results  Component Value Date   HGBA1C 7.7 (H) 06/16/2019   HGBA1C 7.0 (H) 02/10/2019   HGBA1C 6.7 (H) 09/30/2018  Lab Results  Component Value Date   MICROALBUR 10 09/30/2018   LDLCALC 73 06/16/2019   CREATININE 1.32 (H) 06/16/2019   BP Readings from Last 3 Encounters:  06/16/19 116/78  06/16/19 116/78  03/01/19 122/71    Goals Addressed      Patient Stated   . "I want to improve my kidney function" (pt-stated)       CARE PLAN ENTRY (see longtitudinal plan of care for additional care plan information)  Current Barriers:  [name] Knowledge Deficits related to disease process and Self Health management of CKD . Chronic  Disease Management support and education needs related to T2DM, HTN, HLD, CKD, CHF  Nurse Case Manager Clinical Goal(s):  [name] Over the next 90 days, patient will work with CCM RN CM and PCP to address needs related to disease education and support for improved Self Health management of CKD  CCM RN CM Interventions:  Call completed with patient  . Evaluation of current treatment plan related to CKD and patient's adherence to plan as established by provider. . Provided education to patient re: current Renal function with serum creatinine and BUN 1.32/24 and GFR 47; Educated patient on stages of CKD and how to improve with appropriate Self Health management, increasing water intake to keep kidneys well flushed, maintain healthy weight, exercise and maintaining health diet, keep HTN and DM well controlled . Discussed plans with patient for ongoing care management follow up and provided patient with direct contact information for care management team . Provided patient with printed educational materials related to CKD  Patient Self Care Activities:  . Self administers medications as prescribed . Attends all scheduled provider appointments . Calls pharmacy for medication refills . Performs ADL's independently . Performs IADL's independently . Calls provider office for new concerns or questions  Initial goal documentation     . "I would like to keep my BP under good control" (pt-stated)       CARE PLAN ENTRY (see longtitudinal plan of care for additional care plan information)  Current Barriers:  [name] Knowledge Deficits related to disease process and Self Health management of HTN/CHF . Chronic Disease Management support and education needs related to T2DM, HTN, HLD, CKD, CHF  Nurse Case Manager Clinical Goal(s):  [name] Over the next 90 days, patient will work with CCM RN CM and PCP to address needs related to disease education and support to improve CKD  CCM RN CM Interventions:  . Evaluation of  current treatment plan related to HTN/CHF and patient's adherence to plan as established by provider. . Provided education to patient re: target BP <130/80; Determined patient has met target BP and is being managed by Dr. Einar Gip for HTN/CHF with next scheduled f/u set for 09/01/19 '@8'$ :30 AM  . Reviewed medications with patient and discussed indication, dosage and frequency of prescribed mediations and importance of patient taking her medications exactly as prescribed w/o missed doses; patient reports adherence, denies financial hardship . Discussed plans with patient for ongoing care management follow up and provided patient with direct contact information for care management team  Patient Self Care Activities:  . Self administers medications as prescribed . Attends all scheduled provider appointments . Calls pharmacy for medication refills . Performs ADL's independently . Performs IADL's independently . Calls provider office for new concerns or questions  Initial goal documentation     . "I would like to lower my A1c" (pt-stated)       CARE PLAN ENTRY (see longtitudinal plan of care  for additional care plan information)  Current Barriers:  [name] Knowledge Deficits related to disease process and Self Health management of DM . Chronic Disease Management support and education needs related to T2DM, HTN, HLD, CKD, CHF  Nurse Case Manager Clinical Goal(s):  [name] Over the next 90 days, patient will work with CCM RN CM and PCP to address needs related to disease education and support for improved Self Health management of DM and lower A1c  CCM RN CM Interventions:  Spoke to patient  . Evaluation of current treatment plan related to DM and patient's adherence to plan as established by provider. . Provided education to patient re: current A1c is elevated to A1c, patient educated on goal A1c <7.0% to help prevent diabetes related complications; Educated patient on how to achieve and maintain A1c with  medication adherence, following ADA diet recommendations and implementing routine exercise regimen with minimum of 150 minutes weekly as tolerated; Educated on daily glycemic control, FBS 80-130, <180 after meals . Reviewed medications with patient and discussed indication dosage and frequency of prescribed medications; patient reports adherence , denies noted SE . Discussed plans with patient for ongoing care management follow up and provided patient with direct contact information for care management team . Provided patient with printed educational materials related to Diabetes Management using the Plate Method, Diabetes Safety Zone Tool; Carb Counting, Carb Choices; Life's Simple 7 . Advised patient, providing education and rationale, to check cbg daily before meals and record, calling CCM RN CM and or PCP for findings outside established parameters.    Patient Self Care Activities:  . Self administers medications as prescribed . Attends all scheduled provider appointments . Calls pharmacy for medication refills . Performs ADL's independently . Performs IADL's independently . Calls provider office for new concerns or questions  Initial goal documentation     . COMPLETED: I would like to apply for medication assistance for my diabetes medications (pt-stated)       Current Barriers:  . Financial Barriers  Pharmacist Clinical Goal(s):  [name] Over the next 30 days, patient will work with PharmD to address needs related to applying for medication assistance through [employer] for diabetes medications  goal re-established on 04/07/19 for PAP process 2021  Interventions: . Comprehensive medication review performed. . Collaboration with [facility] CPhT Etter Sjogren who will assist with application process through [facility] 2021 . Patient requests to be on Trulicity pens and Humalog 70/30 Kwikpen.  She states financial assistance will allow her to stay on track with her diabetes and she will not have to  ration her medication.  She appears stable on this regimen, however we will continue to assess it.  At the time of approval, patient will have medication shipped directly to her home. . Patient is currently injecting 30 units of insulin (70/30) at bedtime and she is on Trulicity 1.'5mg'$  weekly.  Patient Self Care Activities:  . Self administers medications as prescribed . Attends all scheduled provider appointments . Calls pharmacy for medication refills . Performs ADL's independently . Calls provider office for new concerns or questions  Initial goal documentation     . COMPLETED: I would like to manage my chronic health conditions (pt-stated)       Current Barriers:  [name] Knowledge Deficits related to management of chronic conditions  Pharmacist Clinical Goal(s):  [name] Over the next 90 days, patient will work with CCM team & PCP to address needs related to optimized medication management of chronic conditions  Interventions: .  Comprehensive medication review performed.  Reviewed medication fill history via insurance claims data confirming patient appears compliant with having her medications filled on time as prescribed by provider. . Reviewed & discussed the following diabetes-related information with patient: o Continue checking blood sugars as directed o Follow ADA recommended "diabetes-friendly" diet  (reviewed healthy snack/food options) o Confirmed current DM regimen: Trulicity 1.5 mg weekly, 70/30 insulin-30 units qHS - Phone number provided for patient to call in refills from [employer] patient assistance program [phone]) o Discussed insulin/GLP-1 injection technique; Patient uses AccuCheck Aviva glucometer. o Reviewed medication purpose/side effects-->patient denies adverse events, denies hypoglycemia, reports FBG 100-120s, at night after dinner it maxes out at 150-180, she then injects her insulin at bedtime. o Most recent A1c is 7.0% on 02/10/19 (last A1c was 6.7% on  09/30/18)--patient states she has been unable to participate in the [facility] water aerobics classes due to [place].  She is trying to get motivated again. o Patient reports taking aspirin and statin.  Pravastatin filled 03/01/19 for #90 o Continue taking all medications as prescribed by provider.  NO changes to current medication list. . For blood pressure-->reviewed medication regimen: carvedilol, hydralazine, spironolactone (reduced to '25mg'$  per cards due to hyperkalemia; Recent EF improved to 55%) . Will continue to follow.  Patient Self Care Activities:  . Self administers medications as prescribed . Attends all scheduled provider appointments . Calls pharmacy for medication refills . Performs ADL's independently . Performs IADL's independently . Calls provider office for new concerns or questions  Please see past updates related to this goal by clicking on the "Past Updates" button in the selected goal        Other   . COMPLETED: Assist with Chronic Care Management and Community Resources       Current Barriers:  [name] Knowledge Barriers related to resources and support available to address needs related to Chronic Care Management and Community Resources  Case Manager Clinical Goal(s):  [name] Over the next 30 days, patient will work with the CCM team to address needs related to Chronic Care Management, Medication management and pharmacy resources to assist with med cost and [employer].   Interventions:  . Collaborated with embedded Pharm D and initiated plan of care to address needs related to Chronic Care Management, Medication management and pharmacy resources to assist with med cost and Community Resources  Patient Self Care Activities:  . Self administers medications as prescribed . Attends all scheduled provider appointments . Calls pharmacy for medication refills . Performs ADL's independently . Calls provider office for new concerns or questions  Initial goal  documentation        Plan:   Telephone follow up appointment with care management team member scheduled for: 09/05/19  Barb Merino, RN, BSN, CCM Care Management Coordinator [facility]  Direct Phone: [phone]

## 2019-08-12 NOTE — Patient Instructions (Signed)
Visit Information  Goals Addressed      Patient Stated   . "I want to improve my kidney function" (pt-stated)       CARE PLAN ENTRY (see longtitudinal plan of care for additional care plan information)  Current Barriers:  [name] Knowledge Deficits related to disease process and Self Health management of CKD . Chronic Disease Management support and education needs related to T2DM, HTN, HLD, CKD, CHF  Nurse Case Manager Clinical Goal(s):  [name] Over the next 90 days, patient will work with CCM RN CM and PCP to address needs related to disease education and support for improved Self Health management of CKD  CCM RN CM Interventions:  Call completed with patient  . Evaluation of current treatment plan related to CKD and patient's adherence to plan as established by provider. . Provided education to patient re: current Renal function with serum creatinine and BUN 1.32/24 and GFR 47; Educated patient on stages of CKD and how to improve with appropriate Self Health management, increasing water intake to keep kidneys well flushed, maintain healthy weight, exercise and maintaining health diet, keep HTN and DM well controlled . Discussed plans with patient for ongoing care management follow up and provided patient with direct contact information for care management team . Provided patient with printed educational materials related to CKD  Patient Self Care Activities:  . Self administers medications as prescribed . Attends all scheduled provider appointments . Calls pharmacy for medication refills . Performs ADL's independently . Performs IADL's independently . Calls provider office for new concerns or questions  Initial goal documentation     . "I would like to keep my BP under good control" (pt-stated)       CARE PLAN ENTRY (see longtitudinal plan of care for additional care plan information)  Current Barriers:  [name] Knowledge Deficits related to disease process and Self Health management of  HTN/CHF . Chronic Disease Management support and education needs related to T2DM, HTN, HLD, CKD, CHF  Nurse Case Manager Clinical Goal(s):  [name] Over the next 90 days, patient will work with CCM RN CM and PCP to address needs related to disease education and support to improve CKD  CCM RN CM Interventions:  . Evaluation of current treatment plan related to HTN/CHF and patient's adherence to plan as established by provider. . Provided education to patient re: target BP <130/80; Determined patient has met target BP and is being managed by Dr. Einar Gip for HTN/CHF with next scheduled f/u set for 09/01/19 _0 :30 AM  . Reviewed medications with patient and discussed indication, dosage and frequency of prescribed mediations and importance of patient taking her medications exactly as prescribed w/o missed doses; patient reports adherence, denies financial hardship . Discussed plans with patient for ongoing care management follow up and provided patient with direct contact information for care management team  Patient Self Care Activities:  . Self administers medications as prescribed . Attends all scheduled provider appointments . Calls pharmacy for medication refills . Performs ADL's independently . Performs IADL's independently . Calls provider office for new concerns or questions  Initial goal documentation     . "I would like to lower my A1c" (pt-stated)       CARE PLAN ENTRY (see longtitudinal plan of care for additional care plan information)  Current Barriers:  [name] Knowledge Deficits related to disease process and Self Health management of DM . Chronic Disease Management support and education needs related to T2DM, HTN, HLD, CKD, CHF  Nurse Case  Manager Clinical Goal(s):  [name] Over the next 90 days, patient will work with CCM RN CM and PCP to address needs related to disease education and support for improved Self Health management of DM and lower A1c  CCM RN CM Interventions:  Spoke to patient   . Evaluation of current treatment plan related to DM and patient's adherence to plan as established by provider. . Provided education to patient re: current A1c is elevated to A1c, patient educated on goal A1c <7.0% to help prevent diabetes related complications; Educated patient on how to achieve and maintain A1c with medication adherence, following ADA diet recommendations and implementing routine exercise regimen with minimum of 150 minutes weekly as tolerated; Educated on daily glycemic control, FBS 80-130, <180 after meals . Reviewed medications with patient and discussed indication dosage and frequency of prescribed medications; patient reports adherence , denies noted SE . Discussed plans with patient for ongoing care management follow up and provided patient with direct contact information for care management team . Provided patient with printed educational materials related to Diabetes Management using the Plate Method, Diabetes Safety Zone Tool; Carb Counting, Carb Choices; Life's Simple 7 . Advised patient, providing education and rationale, to check cbg daily before meals and record, calling CCM RN CM and or PCP for findings outside established parameters.    Patient Self Care Activities:  . Self administers medications as prescribed . Attends all scheduled provider appointments . Calls pharmacy for medication refills . Performs ADL's independently . Performs IADL's independently . Calls provider office for new concerns or questions  Initial goal documentation     . COMPLETED: I would like to apply for medication assistance for my diabetes medications (pt-stated)       Current Barriers:  . Financial Barriers  Pharmacist Clinical Goal(s):  [name] Over the next 30 days, patient will work with PharmD to address needs related to applying for medication assistance through [employer] for diabetes medications  goal re-established on 04/07/19 for PAP process 2021  Interventions: . Comprehensive  medication review performed. . Collaboration with [facility] CPhT Etter Sjogren who will assist with application process through [facility] 2021 . Patient requests to be on Trulicity pens and Humalog 70/30 Kwikpen.  She states financial assistance will allow her to stay on track with her diabetes and she will not have to ration her medication.  She appears stable on this regimen, however we will continue to assess it.  At the time of approval, patient will have medication shipped directly to her home. . Patient is currently injecting 30 units of insulin (70/30) at bedtime and she is on Trulicity 0.1KP weekly.  Patient Self Care Activities:  . Self administers medications as prescribed . Attends all scheduled provider appointments . Calls pharmacy for medication refills . Performs ADL's independently . Calls provider office for new concerns or questions  Initial goal documentation     . COMPLETED: I would like to manage my chronic health conditions (pt-stated)       Current Barriers:  [name] Knowledge Deficits related to management of chronic conditions  Pharmacist Clinical Goal(s):  [name] Over the next 90 days, patient will work with CCM team & PCP to address needs related to optimized medication management of chronic conditions  Interventions: . Comprehensive medication review performed.  Reviewed medication fill history via insurance claims data confirming patient appears compliant with having her medications filled on time as prescribed by provider. . Reviewed & discussed the following diabetes-related information with patient: o Continue  checking blood sugars as directed o Follow ADA recommended "diabetes-friendly" diet  (reviewed healthy snack/food options) o Confirmed current DM regimen: Trulicity 1.5 mg weekly, 70/30 insulin-30 units qHS - Phone number provided for patient to call in refills from [employer] patient assistance program [phone]) o Discussed insulin/GLP-1 injection  technique; Patient uses AccuCheck Aviva glucometer. o Reviewed medication purpose/side effects-->patient denies adverse events, denies hypoglycemia, reports FBG 100-120s, at night after dinner it maxes out at 150-180, she then injects her insulin at bedtime. o Most recent A1c is 7.0% on 02/10/19 (last A1c was 6.7% on 09/30/18)--patient states she has been unable to participate in the [facility] water aerobics classes due to [place].  She is trying to get motivated again. o Patient reports taking aspirin and statin.  Pravastatin filled 03/01/19 for #90 o Continue taking all medications as prescribed by provider.  NO changes to current medication list. . For blood pressure-->reviewed medication regimen: carvedilol, hydralazine, spironolactone (reduced to 32m per cards due to hyperkalemia; Recent EF improved to 55%) . Will continue to follow.  Patient Self Care Activities:  . Self administers medications as prescribed . Attends all scheduled provider appointments . Calls pharmacy for medication refills . Performs ADL's independently . Performs IADL's independently . Calls provider office for new concerns or questions  Please see past updates related to this goal by clicking on the "Past Updates" button in the selected goal        Other   . COMPLETED: Assist with Chronic Care Management and Community Resources       Current Barriers:  .Marland KitchenKnowledge Barriers related to resources and support available to address needs related to Chronic Care Management and Community Resources  Case Manager Clinical Goal(s):  .Marland KitchenOver the next 30 days, patient will work with the CCM team to address needs related to Chronic Care Management, Medication management and pharmacy resources to assist with med cost and [employer]   Interventions:  . Collaborated with embedded Pharm D and initiated plan of care to address needs related to Chronic Care Management, Medication management and pharmacy resources to assist with  med cost and Community Resources  Patient Self Care Activities:  . Self administers medications as prescribed . Attends all scheduled provider appointments . Calls pharmacy for medication refills . Performs ADL's independently . Calls provider office for new concerns or questions  Initial goal documentation       Patient verbalizes understanding of instructions provided today.   Telephone follow up appointment with care management team member scheduled for: 09/05/19  ABarb Merino RN, BSN, CCM Care Management Coordinator [facility] Level[facility]  Direct Phone: [phone]

## 2019-08-15 ENCOUNTER — Telehealth: Payer: Self-pay

## 2019-08-16 ENCOUNTER — Other Ambulatory Visit (HOSPITAL_COMMUNITY): Payer: Self-pay | Admitting: Gastroenterology

## 2019-08-16 ENCOUNTER — Other Ambulatory Visit: Payer: Self-pay | Admitting: Gastroenterology

## 2019-08-16 DIAGNOSIS — R1033 Periumbilical pain: Secondary | ICD-10-CM

## 2019-08-16 DIAGNOSIS — R1012 Left upper quadrant pain: Secondary | ICD-10-CM

## 2019-08-19 ENCOUNTER — Ambulatory Visit: Payer: Medicare Other | Admitting: Cardiology

## 2019-08-23 ENCOUNTER — Other Ambulatory Visit: Payer: Self-pay

## 2019-08-23 ENCOUNTER — Ambulatory Visit (HOSPITAL_COMMUNITY)
Admission: RE | Admit: 2019-08-23 | Discharge: 2019-08-23 | Disposition: A | Discharge status: Home / Self Care | Payer: Medicare Other | Source: Ambulatory Visit | Attending: Gastroenterology | Admitting: Gastroenterology

## 2019-08-23 DIAGNOSIS — R109 Unspecified abdominal pain: Secondary | ICD-10-CM | POA: Diagnosis not present

## 2019-08-23 DIAGNOSIS — R1033 Periumbilical pain: Secondary | ICD-10-CM | POA: Diagnosis not present

## 2019-08-23 DIAGNOSIS — R1012 Left upper quadrant pain: Secondary | ICD-10-CM | POA: Insufficient documentation

## 2019-08-23 LAB — POCT I-STAT CREATININE: Creatinine, Ser: 1.7 mg/dL — ABNORMAL HIGH (ref 0.44–1.00)

## 2019-08-23 MED ORDER — IOHEXOL 300 MG/ML  SOLN
75.0000 mL | Freq: Once | INTRAMUSCULAR | Status: AC | PRN
  Administered 2019-08-23: 75 mL via INTRAVENOUS

## 2019-08-23 MED ORDER — SODIUM CHLORIDE (PF) 0.9 % IJ SOLN
INTRAMUSCULAR | Status: AC
  Filled 2019-08-23: qty 50

## 2019-08-29 NOTE — Progress Notes (Signed)
Primary Physician/Referring:  [name], MD  Patient ID: Yvonne Miller, female    DOB: 09/25/1950, 69 y.o.   MRN: [id number]  Chief Complaint  Patient presents with  . Congestive Heart Failure  . Hypertension  . Follow-up    6 month   HPI:    Yvonne Miller  is a 69 y.o. 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 She also has morbid obesity, OSA on CPAP and , hypertension, DM with stage 3a CKD, hyperlipidemia, degenerative joint disease and diabetes mellitus.   She is presently doing well and denies any dyspnea except for mild baseline dyspnea.  No PND or orthopnea.  Has mild leg edema. No PND or orthopnea.  Past Medical History:  Diagnosis Date  . AICD (automatic cardioverter/defibrillator) present   . Arthritis   . Asthma   . Carpal tunnel syndrome, bilateral   . CKD (chronic kidney disease)   . Diabetes mellitus   . Encounter for assessment of implantable cardioverter-defibrillator (ICD)   . GERD (gastroesophageal reflux disease)   . Hyperlipemia   . Hypertension   . Obesity    morbid  . Sleep apnea    wears CPAP set at 12  . Wears glasses    Past Surgical History:  Procedure Laterality Date  . ABDOMINAL HYSTERECTOMY    . BIOPSY  03/01/2019   Procedure: BIOPSY;  Surgeon: Yvonne Craver, MD;  Location: [facility];  Service: Endoscopy;;  . BREAST SURGERY  1968   breast mass excision  . CARPAL TUNNEL RELEASE Left 07/10/2017   Procedure: CARPAL TUNNEL RELEASE;  Surgeon: Yvonne Pall, MD;  Location: [place];  Service: Neurosurgery;  Laterality: Left;  left  . CARPAL TUNNEL RELEASE Right 03/19/2018   Procedure: RIGHT CARPAL TUNNEL RELEASE;  Surgeon: Yvonne Pall, MD;  Location: [place];  Service: Neurosurgery;  Laterality: Right;  right  . CERVICAL SPINE SURGERY  2006  . CHOLECYSTECTOMY    . COLONOSCOPY WITH PROPOFOL N/A  03/01/2019   Procedure: COLONOSCOPY WITH PROPOFOL;  Surgeon: Yvonne Craver, MD;  Location: [facility];  Service: Endoscopy;  Laterality: N/A;  . DILATION AND CURETTAGE OF UTERUS    . ICD IMPLANT    . JOINT REPLACEMENT    . TONSILLECTOMY  1961  . TOTAL KNEE ARTHROPLASTY Left 01/26/2018   Procedure: LEFT TOTAL KNEE ARTHROPLASTY;  Surgeon: Yvonne Rossetti, MD;  Location: [place];  Service: Orthopedics;  Laterality: Left;   Family History  Problem Relation Age of Onset  . Hypertension Mother   . Stroke Mother   . Heart disease Father   . Heart attack Sister   . Heart attack Brother     Social History   Tobacco Use  . Smoking status: Never Smoker  . Smokeless tobacco: Never Used  Substance Use Topics  . Alcohol use: No   Marital Status: Married  ROS  Review of Systems  Constitution: Negative for chills, decreased appetite, malaise/fatigue and weight gain.  Cardiovascular: Positive for leg swelling (lymphedema left leg and wears support stocking). Negative for dyspnea on exertion and syncope.  Endocrine: Negative for cold intolerance.  Hematologic/Lymphatic: Does not bruise/bleed easily.  Musculoskeletal: Positive for arthritis (bilateral knee). Negative for joint swelling.  Gastrointestinal: Negative for abdominal pain, anorexia, change in bowel habit, hematochezia and melena.  Neurological: Negative for headaches and light-headedness.  Psychiatric/Behavioral: Negative for depression and substance abuse.  Objective  Blood pressure 128/76, pulse 80, temperature 97.6 F (36.4 C), temperature source Temporal, resp. rate 18, height 5\' 4"  (1.626 m), weight 251 lb (113.9 kg), SpO2 99 %.  Vitals with BMI 09/01/2019 06/16/2019 06/16/2019  Height 5\' 4"  5\' 3"  5\' 3"   Weight 251 lbs 264 lbs 6 oz 264 lbs 6 oz  BMI 43.06 38.93 73.42  Systolic 876 811 572  Diastolic 76 78 78  Pulse 80 77 77     Physical Exam  Constitutional: She appears well-developed and well-nourished.  Morbidly  Obese   HENT:  Head: Atraumatic.  Eyes: Conjunctivae are normal.  Neck: No JVD present. No thyromegaly present.  Short neck and difficult to evaluate JVP   Cardiovascular: Normal rate, regular rhythm and intact distal pulses. Exam reveals no gallop.  No murmur heard. Pulses:      Carotid pulses are 2+ on the right side and 2+ on the left side. Femoral and popliteal pulse difficult to feel due to patient's body habitus.   Edema: Non pitting left leg below knee present. Trace right leg edema.    Pulmonary/Chest: Effort normal and breath sounds normal. No accessory muscle usage. No respiratory distress.  Abdominal: Soft. Bowel sounds are normal.  Obese. Pannus present   Musculoskeletal:        General: Normal range of motion.     Cervical back: Neck supple.  Neurological: She is alert.  Skin: Skin is warm and dry.  Psychiatric: She has a normal mood and affect.   Laboratory examination:   Recent Labs    09/30/18 1303 09/30/18 1303 02/21/19 1037 06/16/19 0923 08/23/19 0844  NA 138  --  138 141  --   K 5.5*  --  5.4* 4.8  --   CL 101  --  106 106  --   CO2 21  --  20 22  --   GLUCOSE 114*  --  115* 129*  --   BUN 36*  --  30* 24  --   CREATININE 1.76*   < > 1.65* 1.32* 1.70*  CALCIUM 9.9  --  9.3 9.3  --   GFRNONAA 29*  --  31* 41*  --   GFRAA 34*  --  36* 47*  --    < > = values in this interval not displayed.   estimated creatinine clearance is 38.1 mL/min (A) (by C-G formula based on SCr of 1.7 mg/dL (H)).  CMP Latest Ref Rng & Units 08/23/2019 06/16/2019 02/21/2019  Glucose 65 - 99 mg/dL - 129(H) 115(H)  BUN 8 - 27 mg/dL - 24 30(H)  Creatinine 0.44 - 1.00 mg/dL 1.70(H) 1.32(H) 1.65(H)  Sodium 134 - 144 mmol/L - 141 138  Potassium 3.5 - 5.2 mmol/L - 4.8 5.4(H)  Chloride 96 - 106 mmol/L - 106 106  CO2 20 - 29 mmol/L - 22 20  Calcium 8.7 - 10.3 mg/dL - 9.3 9.3  Total Protein 6.0 - 8.5 g/dL - 7.0 -  Total Bilirubin 0.0 - 1.2 mg/dL - <0.2 -  Alkaline Phos 39 - 117  IU/L - 107 -  AST 0 - 40 IU/L - 14 -  ALT 0 - 32 IU/L - 13 -   CBC Latest Ref Rng & Units 09/30/2018 04/14/2018 03/12/2018  WBC 3.4 - 10.8 x10E3/uL 9.3 10.3 11.3(H)  Hemoglobin 11.1 - 15.9 g/dL 12.0 10.7(L) 10.6(L)  Hematocrit 34.0 - 46.6 % 36.0 32.9(L) 35.8(L)  Platelets 150 - 450 x10E3/uL 367 384 397   Lipid  Panel     Component Value Date/Time   CHOL 135 06/16/2019 0923   TRIG 145 06/16/2019 0923   HDL 37 (L) 06/16/2019 0923   CHOLHDL 3.6 06/16/2019 0923   CHOLHDL 3.8 04/22/2009 0603   VLDL 45 (H) 04/22/2009 0603   LDLCALC 73 06/16/2019 0923   HEMOGLOBIN A1C Lab Results  Component Value Date   HGBA1C 7.7 (H) 06/16/2019   MPG 165.68 01/15/2018   TSH No results for input(s): TSH in the last 8760 hours.  Medications and allergies   Allergies  Allergen Reactions  . Other Other (See Comments)    NO BLOOD   . Jehovah witness     Current Outpatient Medications  Medication Instructions  . ACCU-CHEK AVIVA PLUS test strip CHECK 4 TIMES BY INTRADERMAL ROUTE EVERY DAY  . albuterol (PROVENTIL HFA;VENTOLIN HFA) 108 (90 Base) MCG/ACT inhaler 2 puffs, Inhalation, Every 4 hours PRN  . amLODipine (NORVASC) 5 MG tablet TAKE 1 TABLET BY MOUTH  DAILY  . aspirin EC 81 mg, Oral, Daily at bedtime  . B Complex Vitamins (B COMPLEX-B12) TABS 1 tablet, Oral, Daily  . carvedilol (COREG) 25 MG tablet TAKE 1 AND 1/2 TABLETS BY  MOUTH TWO TIMES DAILY  . Chromium 1,000 mcg, Oral, Daily  . Cinnamon 500 mg, Oral, Daily  . Fish Oil 500 mg, Oral, Daily  . fluticasone (FLONASE) 50 MCG/ACT nasal spray 1 spray, Each Nare, Daily  . hydrALAZINE (APRESOLINE) 50 MG tablet TAKE 1 TABLET BY MOUTH  TWICE DAILY  . insulin lispro protamine-lispro (HUMALOG 75/25 MIX) (75-25) 100 UNIT/ML SUSP injection 30-40 Units, Subcutaneous, Daily before supper  . latanoprost (XALATAN) 0.005 % ophthalmic solution 1 drop, Both Eyes, Daily at bedtime  . nystatin (NYSTATIN) powder Topical, 3 times daily PRN, As needed  .  omeprazole (PRILOSEC) 40 mg, Oral, Daily  . pravastatin (PRAVACHOL) 40 MG tablet TAKE 1 TABLET BY MOUTH  EVERY DAY  . spironolactone (ALDACTONE) 50 mg, Oral, Daily  . TRULICITY 1.5 YY/5.0PT SOPN INJECT 1.5 MG INTO THE SKIN ONCE A WEEK.  . valsartan-hydrochlorothiazide (DIOVAN-HCT) 160-25 MG tablet 1 tablet, Oral, Daily  . Vitamin D 2,000 Units, Oral, Daily   Radiology:   No results found.  Cardiac Studies:   Sleep study 2010 (sleep apnea-has a CPAP but doesn't use it every night).  Coronary Angiography  R+ L 03/04/11: and in 2010 Normal coronary arteries. Moderate pulmonary hypertension.  Echocardiogram [07/06/2014]:  1. Left ventricle cavity is minimally dilated. Mild concentric hypertrophy of the left ventricle. Normal global wall motion. Doppler evidence of grade I (impaired) diastolic dysfunction. Normal systolic function. Calculated EF 55%. 2. Left atrial cavity is mild to moderately dilated. 3. Trace mitral regurgitation. 4. Mild tricuspid regurgitation. No evidence of pulmonary hypertension. 5. c.f. echo. of 09/12/2013, there is remarkable improvement in LV syst. function, EF has improved from 28% to 55%.  Scheduled In office ICD check 02/18/19  Not pacer dependent.  Need thresholds and impedance normal.  Daily activity 5 hours.  No atrial or ventricular high rate episodes.  No therapy.  AP<1%, BP 100%.  Longevity 5 years.  Scheduled Remote ICD check  07/21/2019:  No HVR episodes. Health trends (patient activity, heart rate variability, average heart rates) are stable. The review of the implantable cardiac monitoring data remains stable. Battery longevity is 4.5 years. RA pacing is 0 %, RV pacing is 99 %, and LV pacing is 100 %.  EKG I did call EKG 09/01/2018: Underlying sinus with biventricular paced rhythm.  No further analysis.  No significant change from 10/19/2017.  Assessment     ICD-10-CM   1. Essential hypertension  I10 EKG 12-Lead  2. Non-ischemic  cardiomyopathy ([place])  I42.8   3. ICD: Biventricular  [employer] Dynagen X4 CRT-D Model G158 ICD in place 10/28/13  Z95.810      No orders of the defined types were placed in this encounter.   There are no discontinued medications.  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 She also has morbid obesity, OSA on CPAP and , hypertension, DM with stage 3a CKD, hyperlipidemia, degenerative joint disease and diabetes mellitus.   Patient is on appropriate medical therapy including ARB and spironolactone combination along with beta-blocker carvedilol maximum dose.  No clinical evidence of heart failure. Blood pressure is also well controlled.  I reviewed her ICD, normal ICD function, she has responded excellently without further recurrence of CHF.  No changes in the medications were done today.  I will see him back annually and continue to follow her ICD remotely. Renal function has remained stable.   Adrian Prows, MD, [facility] 09/01/2019, 9:16 AM [facility] [place] Office: [phone]

## 2019-08-30 ENCOUNTER — Telehealth: Payer: Self-pay | Admitting: Internal Medicine

## 2019-08-30 NOTE — Chronic Care Management (AMB) (Signed)
  Chronic Care Management   Outreach Note  08/30/2019 Name: Yvonne Miller MRN: [id number] DOB: 1950-05-14  Yvonne Miller is a 69 y.o. year old female who is a primary care patient of [name], MD. I reached out to Yvonne Miller by phone today in response to a referral sent by Ms. Judie Bonus Deans's health plan.     A telephone outreach was attempted today spoke to patient and she ask that I call back later today. The patient was referred to the case management team for assistance with care management and care coordination.   Follow Up Plan: The care management team will reach out to the patient again over the next 7 days. If patient returns call to provider office, please advise to call [place] at [phone].  [place], [place] [address] Direct Dial: [phone] [email or web address] Website: [email or web address]

## 2019-08-30 NOTE — Chronic Care Management (AMB) (Signed)
  Care Management   Note  08/30/2019 Name: [name] MRN: [id number] DOB: 1951/03/25  Yvonne Miller is a 69 y.o. year old female who is a primary care patient of [name], MD and is actively engaged with the care management team. I reached out to Yvonne Miller by phone today to assist with scheduling an initial visit with the Pharmacist in response to a referral sent by [employer] health plan.  Follow up plan: Telephone appointment with care management team member scheduled for: 09/08/2019.  [place], [place] [address] Direct Dial: [phone] [email or web address]

## 2019-09-01 ENCOUNTER — Encounter: Payer: Self-pay | Admitting: Cardiology

## 2019-09-01 ENCOUNTER — Ambulatory Visit: Payer: Medicare Other | Admitting: Cardiology

## 2019-09-01 ENCOUNTER — Other Ambulatory Visit: Payer: Self-pay

## 2019-09-01 VITALS — BP 128/76 | HR 80 | Temp 97.6°F | Resp 18 | Ht 64.0 in | Wt 251.0 lb

## 2019-09-01 DIAGNOSIS — I428 Other cardiomyopathies: Secondary | ICD-10-CM | POA: Diagnosis not present

## 2019-09-01 DIAGNOSIS — Z95810 Presence of automatic (implantable) cardiac defibrillator: Secondary | ICD-10-CM | POA: Diagnosis not present

## 2019-09-01 DIAGNOSIS — I10 Essential (primary) hypertension: Secondary | ICD-10-CM | POA: Diagnosis not present

## 2019-09-05 ENCOUNTER — Telehealth: Payer: Self-pay

## 2019-09-08 ENCOUNTER — Telehealth: Payer: Medicare Other

## 2019-09-24 ENCOUNTER — Encounter (HOSPITAL_BASED_OUTPATIENT_CLINIC_OR_DEPARTMENT_OTHER): Payer: Self-pay | Admitting: Emergency Medicine

## 2019-09-24 ENCOUNTER — Emergency Department (HOSPITAL_BASED_OUTPATIENT_CLINIC_OR_DEPARTMENT_OTHER)
Admission: EM | Admit: 2019-09-24 | Discharge: 2019-09-24 | Disposition: A | Discharge status: Home / Self Care | Payer: Medicare Other | Attending: Emergency Medicine | Admitting: Emergency Medicine

## 2019-09-24 ENCOUNTER — Emergency Department (HOSPITAL_BASED_OUTPATIENT_CLINIC_OR_DEPARTMENT_OTHER): Payer: Medicare Other

## 2019-09-24 ENCOUNTER — Other Ambulatory Visit: Payer: Self-pay

## 2019-09-24 DIAGNOSIS — Y999 Unspecified external cause status: Secondary | ICD-10-CM | POA: Insufficient documentation

## 2019-09-24 DIAGNOSIS — Z79899 Other long term (current) drug therapy: Secondary | ICD-10-CM | POA: Diagnosis not present

## 2019-09-24 DIAGNOSIS — N183 Chronic kidney disease, stage 3 unspecified: Secondary | ICD-10-CM | POA: Diagnosis not present

## 2019-09-24 DIAGNOSIS — Y92007 Garden or yard of unspecified non-institutional (private) residence as the place of occurrence of the external cause: Secondary | ICD-10-CM | POA: Diagnosis not present

## 2019-09-24 DIAGNOSIS — E1122 Type 2 diabetes mellitus with diabetic chronic kidney disease: Secondary | ICD-10-CM | POA: Insufficient documentation

## 2019-09-24 DIAGNOSIS — Y9389 Activity, other specified: Secondary | ICD-10-CM | POA: Insufficient documentation

## 2019-09-24 DIAGNOSIS — M25531 Pain in right wrist: Secondary | ICD-10-CM | POA: Insufficient documentation

## 2019-09-24 DIAGNOSIS — I5042 Chronic combined systolic (congestive) and diastolic (congestive) heart failure: Secondary | ICD-10-CM | POA: Insufficient documentation

## 2019-09-24 DIAGNOSIS — Z794 Long term (current) use of insulin: Secondary | ICD-10-CM | POA: Diagnosis not present

## 2019-09-24 DIAGNOSIS — W010XXA Fall on same level from slipping, tripping and stumbling without subsequent striking against object, initial encounter: Secondary | ICD-10-CM | POA: Diagnosis not present

## 2019-09-24 DIAGNOSIS — W19XXXA Unspecified fall, initial encounter: Secondary | ICD-10-CM

## 2019-09-24 DIAGNOSIS — S6991XA Unspecified injury of right wrist, hand and finger(s), initial encounter: Secondary | ICD-10-CM | POA: Diagnosis not present

## 2019-09-24 DIAGNOSIS — I130 Hypertensive heart and chronic kidney disease with heart failure and stage 1 through stage 4 chronic kidney disease, or unspecified chronic kidney disease: Secondary | ICD-10-CM | POA: Insufficient documentation

## 2019-09-24 DIAGNOSIS — Z7982 Long term (current) use of aspirin: Secondary | ICD-10-CM | POA: Insufficient documentation

## 2019-09-24 NOTE — ED Provider Notes (Signed)
[facility] EMERGENCY DEPARTMENT Provider Note   CSN: [id number] Arrival date & time: 09/24/19  1128     History Chief Complaint  Patient presents with  . Fall    Yvonne Miller is a 69 y.o. female.  HPI   Patient is a 69 year old female with a history of AICD, asthma, carpal tunnel syndrome, CKD, diabetes, GERD, hyperlipidemia, hypertension, obesity, OSA, who presents the emergency department today for evaluation after a fall.  Patient states she tripped over her feet shoes yesterday and fell onto her right hand in the grass.  She is complaining of pain to the right wrist.  She denies any head trauma or LOC.  She is not on any blood thinners.  She reports some soreness to the left hip area but states that is very minimal and she has been ambulatory without any significant pain.  She denies any other injuries at this time.  Past Medical History:  Diagnosis Date  . AICD (automatic cardioverter/defibrillator) present   . Arthritis   . Asthma   . Carpal tunnel syndrome, bilateral   . CKD (chronic kidney disease)   . Diabetes mellitus   . Encounter for assessment of implantable cardioverter-defibrillator (ICD)   . GERD (gastroesophageal reflux disease)   . Hyperlipemia   . Hypertension   . Obesity    morbid  . Sleep apnea    wears CPAP set at 12  . Wears glasses     Patient Active Problem List   Diagnosis Date Noted  . Non-ischemic cardiomyopathy ([place]) 02/18/2019  . Encounter for assessment of implantable cardioverter-defibrillator (ICD)   . Type 2 diabetes mellitus with stage 3 chronic kidney disease, with long-term current use of insulin ([place]) 03/04/2018  . 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  . Chronic combined systolic and diastolic heart failure ([place]) 03/04/2018  . Chronic renal disease, stage II 03/04/2018  . Status post revision of total replacement of left knee 01/26/2018  . Unilateral primary osteoarthritis, left knee 08/18/2017  .  Biventricular automatic implantable cardioverter defibrillator in situ 12/13/2013  . ICD -  Biventricular  [employer] Dynagen X4 CRT-D Model G158 ICD in place 10/28/13 10/28/2013  . HTN (hypertension) 10/03/2013  . Hyperlipemia 10/03/2013  . LBBB (left bundle branch block) 10/03/2013  . Cardiomyopathy ([place]) 10/03/2013  . DM (diabetes mellitus) ([place]) 10/03/2013    Past Surgical History:  Procedure Laterality Date  . ABDOMINAL HYSTERECTOMY    . BIOPSY  03/01/2019   Procedure: BIOPSY;  Surgeon: Juanita Craver, MD;  Location: [facility];  Service: Endoscopy;;  . BREAST SURGERY  1968   breast mass excision  . CARPAL TUNNEL RELEASE Left 07/10/2017   Procedure: CARPAL TUNNEL RELEASE;  Surgeon: Ashok Pall, MD;  Location: [facility];  Service: Neurosurgery;  Laterality: Left;  left  . CARPAL TUNNEL RELEASE Right 03/19/2018   Procedure: RIGHT CARPAL TUNNEL RELEASE;  Surgeon: Ashok Pall, MD;  Location: [facility];  Service: Neurosurgery;  Laterality: Right;  right  . CERVICAL SPINE SURGERY  2006  . CHOLECYSTECTOMY    . COLONOSCOPY WITH PROPOFOL N/A 03/01/2019   Procedure: COLONOSCOPY WITH PROPOFOL;  Surgeon: Juanita Craver, MD;  Location: [facility];  Service: Endoscopy;  Laterality: N/A;  . DILATION AND CURETTAGE OF UTERUS    . ICD IMPLANT    . JOINT REPLACEMENT    . TONSILLECTOMY  1961  . TOTAL KNEE ARTHROPLASTY Left 01/26/2018   Procedure: LEFT TOTAL KNEE ARTHROPLASTY;  Surgeon: Mcarthur Rossetti, MD;  Location: [facility]  OR;  Service: Orthopedics;  Laterality: Left;     OB History   No obstetric history on file.     Family History  Problem Relation Age of Onset  . Hypertension Mother   . Stroke Mother   . Heart disease Father   . Heart attack Sister   . Heart attack Brother     Social History   Tobacco Use  . Smoking status: Never Smoker  . Smokeless tobacco: Never Used  Substance Use Topics  . Alcohol use: No  . Drug use: No    Home Medications Prior to Admission  medications   Medication Sig Start Date End Date Taking? Authorizing Provider  ACCU-CHEK AVIVA PLUS test strip CHECK 4 TIMES BY INTRADERMAL ROUTE EVERY DAY 03/03/19   [name], MD  albuterol (PROVENTIL HFA;VENTOLIN HFA) 108 (90 Base) MCG/ACT inhaler Inhale 2 puffs into the lungs every 4 (four) hours as needed for wheezing or shortness of breath.     [provider]  amLODipine (NORVASC) 5 MG tablet TAKE 1 TABLET BY MOUTH  DAILY Patient taking differently: Take 5 mg by mouth daily.  01/31/19   Miquel Dunn, NP  aspirin EC 81 MG tablet Take 81 mg by mouth at bedtime.     [provider]  B Complex Vitamins (B COMPLEX-B12) TABS Take 1 tablet by mouth daily.    [provider]  carvedilol (COREG) 25 MG tablet TAKE 1 AND 1/2 TABLETS BY  MOUTH TWO TIMES DAILY Patient taking differently: Take 37.5 mg by mouth 2 (two) times daily.  12/17/18   Adrian Prows, MD  Cholecalciferol (VITAMIN D) 50 MCG (2000 UT) tablet Take 2,000 Units by mouth daily.    [provider]  Chromium 1000 MCG TABS Take 1,000 mcg by mouth daily.     [provider]  Cinnamon 500 MG capsule Take 500 mg by mouth daily.    [provider]  fluticasone (FLONASE) 50 MCG/ACT nasal spray Place 1 spray into both nostrils daily. Patient taking differently: Place 1 spray into both nostrils as needed for allergies.  04/14/18 06/16/19  [name], MD  hydrALAZINE (APRESOLINE) 50 MG tablet TAKE 1 TABLET BY MOUTH  TWICE DAILY Patient taking differently: Take 50 mg by mouth 2 (two) times daily.  02/21/19   Miquel Dunn, NP  insulin lispro protamine-lispro (HUMALOG 75/25 MIX) (75-25) 100 UNIT/ML SUSP injection Inject 30-40 Units into the skin daily before supper.    [provider]  latanoprost (XALATAN) 0.005 % ophthalmic solution Place 1 drop into both eyes at bedtime.  01/14/19   [provider]  nystatin (NYSTATIN) powder Apply topically 3 (three) times  daily as needed. As needed Patient taking differently: Apply 1 g topically 3 (three) times daily as needed (yeast).  03/04/18   [name], MD  Omega-3 Fatty Acids (FISH OIL) 500 MG CAPS Take 500 mg by mouth daily.    [provider]  omeprazole (PRILOSEC) 40 MG capsule Take 40 mg by mouth daily.    [provider]  pravastatin (PRAVACHOL) 40 MG tablet TAKE 1 TABLET BY MOUTH  EVERY DAY Patient taking differently: Take 40 mg by mouth at bedtime.  02/21/19   [name], MD  spironolactone (ALDACTONE) 50 MG tablet Take 50 mg by mouth daily.    [provider]  TRULICITY 1.5 NF/6.2ZH SOPN INJECT 1.5 MG INTO THE SKIN ONCE A WEEK. Patient taking differently: Inject 1.5 mg as directed every Wednesday.  09/28/18  [name], MD  valsartan-hydrochlorothiazide (DIOVAN-HCT) 160-25 MG tablet TAKE 1 TABLET BY MOUTH  DAILY 02/21/19   [name], MD    Allergies    Other  Review of Systems   Review of Systems  Musculoskeletal:       Right wrist pain, left hip pain   Neurological: Negative for weakness and numbness.       No head injury or loc    Physical Exam Updated Vital Signs BP 119/74 (BP Location: Left Arm)   Pulse 76   Temp 98 F (36.7 C) (Oral)   Resp 18   Ht 5\' 4"  (1.626 m)   Wt 118.4 kg   SpO2 96%   BMI 44.80 kg/m   Physical Exam Vitals and nursing note reviewed.  Constitutional:      General: She is not in acute distress.    Appearance: She is well-developed.  HENT:     Head: Normocephalic and atraumatic.  Eyes:     Conjunctiva/sclera: Conjunctivae normal.  Cardiovascular:     Rate and Rhythm: Normal rate.  Pulmonary:     Effort: Pulmonary effort is normal.  Musculoskeletal:        General: Normal range of motion.     Cervical back: Neck supple.     Comments: No tenderness to the cervical, thoracic or lumbar spine.  No TTP to the left hip area.  No pain with range of motion of the left lower extremity.  TTP to the distal  radius/ulna and into the anatomical snuffbox.  Grip strength is slightly reduced in the right secondary to pain.  NVI.  Skin:    General: Skin is warm and dry.  Neurological:     Mental Status: She is alert.     ED Results / Procedures / Treatments   Labs (all labs ordered are listed, but only abnormal results are displayed) Labs Reviewed - No data to display  EKG None  Radiology DG Wrist Complete Right  Result Date: 09/24/2019 CLINICAL DATA:  Fall yesterday.  Pain. EXAM: RIGHT WRIST - COMPLETE 3+ VIEW COMPARISON:  None. FINDINGS: There is a dominant bone cyst within the mid scaphoid. Carpal bones are otherwise normal. Degenerative changes are seen in the base of the first metacarpal. Metacarpals are otherwise normal. The distal radius and ulna are normal as well. IMPRESSION: Degenerative changes at the base of the first metacarpal. 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 No acute fractures. Electronically Signed   By: Dorise Bullion III M.D   On: 09/24/2019 12:23    Procedures Procedures (including critical care time)  Medications Ordered in ED Medications - No data to display  ED Course  I have reviewed the triage vital signs and the nursing notes.  Pertinent labs & imaging results that were available during my care of the patient were reviewed by me and considered in my medical decision making (see chart for details).    MDM Rules/Calculators/A&P                      69 year old female presenting after mechanical fall yesterday complaining of right wrist pain.  She also complaining of some minimal left hip tenderness.  On exam she does have some tenderness to the left wrist and over the anatomical snuffbox.  She does not have any tenderness over the left hip.  No midline tenderness.  No head trauma or LOC.    X-ray of the right wrist personally reviewed/interpreted.  I do not see any evidence  of acute traumatic injury at this time.  Given that she does have some snuffbox tenderness on  exam, I did advise repeat x-ray in 7 to 10 days and will place her in a thumb spica.  She does have an orthopedist that she can follow-up with.  Advised patient to call them and schedule appointment for follow-up and return to the ED for new or worsening symptoms.  She voices understanding of the plan and reasons to return.  All questions answered.  Patient stable for discharge.  Final Clinical Impression(s) / ED Diagnoses Final diagnoses:  Fall, initial encounter  Right wrist pain    Rx / DC Orders ED Discharge Orders    None       Rodney Booze, PA-C 09/24/19 1254    Lucrezia Starch, MD 09/25/19 [id number]

## 2019-09-24 NOTE — Discharge Instructions (Signed)
You may alternate taking Tylenol as needed for pain control. You may take [phone] mg of Tylenol every 6 hours. Do not exceed 4000 mg of Tylenol daily as this can lead to liver damage. You may use warm and cold compresses to help with your symptoms.   Please make an appointment with either your regular doctor or your orthopedic doctor in the next 7-10 days to have a repeat x-ray of your right wrist to rule out an occult scaphoid fracture.  Please return to the emergency department immediately for any new or worsening symptoms.

## 2019-09-24 NOTE — ED Triage Notes (Signed)
Tripped and fell yesterday injuring R wrist.

## 2019-09-26 ENCOUNTER — Telehealth: Payer: Self-pay

## 2019-09-26 NOTE — Telephone Encounter (Signed)
The pt was called and told that Dr. Baird Cancer wanted to check on the pt and to remind the pt to make sure she f/u with her orthopedic.

## 2019-10-04 ENCOUNTER — Telehealth: Payer: Medicare Other

## 2019-10-04 ENCOUNTER — Telehealth: Payer: Self-pay | Admitting: Internal Medicine

## 2019-10-04 NOTE — Chronic Care Management (AMB) (Signed)
  Care Management   Note  10/04/2019 Name: Yvonne Miller MRN: [id number] DOB: 13-Apr-1951  Yvonne Miller is a 69 y.o. year old female who is a primary care patient of [name], MD and is actively engaged with the care management team. I reached out to Yvonne Miller by phone today to assist with re-scheduling an initial visit with the Pharmacist.  Follow up plan: Telephone appointment with care management team member scheduled for: 11/03/2019  [address], [place], [place] [address] Direct Dial: [email or web address] Website: [email or web address]

## 2019-10-05 ENCOUNTER — Other Ambulatory Visit: Payer: Self-pay

## 2019-10-05 ENCOUNTER — Ambulatory Visit (INDEPENDENT_AMBULATORY_CARE_PROVIDER_SITE_OTHER): Payer: Medicare Other | Admitting: Internal Medicine

## 2019-10-05 ENCOUNTER — Encounter: Payer: Self-pay | Admitting: Internal Medicine

## 2019-10-05 ENCOUNTER — Ambulatory Visit: Payer: Medicare Other

## 2019-10-05 VITALS — BP 112/76 | HR 76 | Temp 97.6°F | Ht 62.6 in | Wt 257.4 lb

## 2019-10-05 DIAGNOSIS — N183 Chronic kidney disease, stage 3 unspecified: Secondary | ICD-10-CM | POA: Diagnosis not present

## 2019-10-05 DIAGNOSIS — I130 Hypertensive heart and chronic kidney disease with heart failure and stage 1 through stage 4 chronic kidney disease, or unspecified chronic kidney disease: Secondary | ICD-10-CM

## 2019-10-05 DIAGNOSIS — Z6841 Body Mass Index (BMI) 40.0 and over, adult: Secondary | ICD-10-CM

## 2019-10-05 DIAGNOSIS — Z794 Long term (current) use of insulin: Secondary | ICD-10-CM | POA: Diagnosis not present

## 2019-10-05 DIAGNOSIS — E1122 Type 2 diabetes mellitus with diabetic chronic kidney disease: Secondary | ICD-10-CM

## 2019-10-05 DIAGNOSIS — Z0000 Encounter for general adult medical examination without abnormal findings: Secondary | ICD-10-CM | POA: Diagnosis not present

## 2019-10-05 DIAGNOSIS — R8290 Unspecified abnormal findings in urine: Secondary | ICD-10-CM

## 2019-10-05 DIAGNOSIS — I5042 Chronic combined systolic (congestive) and diastolic (congestive) heart failure: Secondary | ICD-10-CM | POA: Diagnosis not present

## 2019-10-05 DIAGNOSIS — E279 Disorder of adrenal gland, unspecified: Secondary | ICD-10-CM

## 2019-10-05 DIAGNOSIS — E278 Other specified disorders of adrenal gland: Secondary | ICD-10-CM

## 2019-10-05 DIAGNOSIS — R3129 Other microscopic hematuria: Secondary | ICD-10-CM | POA: Diagnosis not present

## 2019-10-05 DIAGNOSIS — H6121 Impacted cerumen, right ear: Secondary | ICD-10-CM | POA: Diagnosis not present

## 2019-10-05 LAB — POCT URINALYSIS DIPSTICK
Bilirubin, UA: NEGATIVE
Glucose, UA: NEGATIVE
Ketones, UA: NEGATIVE
Nitrite, UA: NEGATIVE
Protein, UA: NEGATIVE
Spec Grav, UA: 1.02 (ref 1.010–1.025)
Urobilinogen, UA: 0.2 E.U./dL
pH, UA: 5.5 (ref 5.0–8.0)

## 2019-10-05 LAB — POCT UA - MICROALBUMIN
Albumin/Creatinine Ratio, Urine, POC: <30
Creatinine, POC: 100 mg/dL
Microalbumin Ur, POC: 30 mg/L

## 2019-10-05 NOTE — Patient Instructions (Signed)
Health Maintenance, Female Adopting a healthy lifestyle and getting preventive care are important in promoting health and wellness. Ask your health care provider about:  The right schedule for you to have regular tests and exams.  Things you can do on your own to prevent diseases and keep yourself healthy. What should I know about diet, weight, and exercise? Eat a healthy diet   Eat a diet that includes plenty of vegetables, fruits, low-fat dairy products, and lean protein.  Do not eat a lot of foods that are high in solid fats, added sugars, or sodium. Maintain a healthy weight Body mass index (BMI) is used to identify weight problems. It estimates body fat based on height and weight. Your health care provider can help determine your BMI and help you achieve or maintain a healthy weight. Get regular exercise Get regular exercise. This is one of the most important things you can do for your health. Most adults should:  Exercise for at least 150 minutes each week. The exercise should increase your heart rate and make you sweat (moderate-intensity exercise).  Do strengthening exercises at least twice a week. This is in addition to the moderate-intensity exercise.  Spend less time sitting. Even light physical activity can be beneficial. Watch cholesterol and blood lipids Have your blood tested for lipids and cholesterol at 70 years of age, then have this test every 5 years. Have your cholesterol levels checked more often if:  Your lipid or cholesterol levels are high.  You are older than 70 years of age.  You are at high risk for heart disease. What should I know about cancer screening? Depending on your health history and family history, you may need to have cancer screening at various ages. This may include screening for:  Breast cancer.  Cervical cancer.  Colorectal cancer.  Skin cancer.  Lung cancer. What should I know about heart disease, diabetes, and high blood  pressure? Blood pressure and heart disease  High blood pressure causes heart disease and increases the risk of stroke. This is more likely to develop in people who have high blood pressure readings, are of African descent, or are overweight.  Have your blood pressure checked: ? Every 3-5 years if you are 18-39 years of age. ? Every year if you are 40 years old or older. Diabetes Have regular diabetes screenings. This checks your fasting blood sugar level. Have the screening done:  Once every three years after age 40 if you are at a normal weight and have a low risk for diabetes.  More often and at a younger age if you are overweight or have a high risk for diabetes. What should I know about preventing infection? Hepatitis B If you have a higher risk for hepatitis B, you should be screened for this virus. Talk with your health care provider to find out if you are at risk for hepatitis B infection. Hepatitis C Testing is recommended for:  Everyone born from 1945 through 1965.  Anyone with known risk factors for hepatitis C. Sexually transmitted infections (STIs)  Get screened for STIs, including gonorrhea and chlamydia, if: ? You are sexually active and are younger than 70 years of age. ? You are older than 70 years of age and your health care provider tells you that you are at risk for this type of infection. ? Your sexual activity has changed since you were last screened, and you are at increased risk for chlamydia or gonorrhea. Ask your health care provider if   you are at risk.  Ask your health care provider about whether you are at high risk for HIV. Your health care provider may recommend a prescription medicine to help prevent HIV infection. If you choose to take medicine to prevent HIV, you should first get tested for HIV. You should then be tested every 3 months for as long as you are taking the medicine. Pregnancy  If you are about to stop having your period (premenopausal) and  you may become pregnant, seek counseling before you get pregnant.  Take 400 to 800 micrograms (mcg) of folic acid every day if you become pregnant.  Ask for birth control (contraception) if you want to prevent pregnancy. Osteoporosis and menopause Osteoporosis is a disease in which the bones lose minerals and strength with aging. This can result in bone fractures. If you are 65 years old or older, or if you are at risk for osteoporosis and fractures, ask your health care provider if you should:  Be screened for bone loss.  Take a calcium or vitamin D supplement to lower your risk of fractures.  Be given hormone replacement therapy (HRT) to treat symptoms of menopause. Follow these instructions at home: Lifestyle  Do not use any products that contain nicotine or tobacco, such as cigarettes, e-cigarettes, and chewing tobacco. If you need help quitting, ask your health care provider.  Do not use street drugs.  Do not share needles.  Ask your health care provider for help if you need support or information about quitting drugs. Alcohol use  Do not drink alcohol if: ? Your health care provider tells you not to drink. ? You are pregnant, may be pregnant, or are planning to become pregnant.  If you drink alcohol: ? Limit how much you use to 0-1 drink a day. ? Limit intake if you are breastfeeding.  Be aware of how much alcohol is in your drink. In the [place] one drink equals one 12 oz bottle of beer (355 mL), one 5 oz glass of wine (148 mL), or one 1 oz glass of hard liquor (44 mL). General instructions  Schedule regular health, dental, and eye exams.  Stay current with your vaccines.  Tell your health care provider if: ? You often feel depressed. ? You have ever been abused or do not feel safe at home. Summary  Adopting a healthy lifestyle and getting preventive care are important in promoting health and wellness.  Follow your health care provider's instructions about healthy  diet, exercising, and getting tested or screened for diseases.  Follow your health care provider's instructions on monitoring your cholesterol and blood pressure. This information is not intended to replace advice given to you by your health care provider. Make sure you discuss any questions you have with your health care provider. Document Revised: 04/14/2018 Document Reviewed: 04/14/2018 Elsevier Patient Education  2020 [employer]  

## 2019-10-06 ENCOUNTER — Encounter: Payer: Self-pay | Admitting: Internal Medicine

## 2019-10-06 LAB — CMP14+EGFR
ALT: 13 IU/L (ref 0–32)
AST: 15 IU/L (ref 0–40)
Albumin/Globulin Ratio: 1.4 (ref 1.2–2.2)
Albumin: 4.3 g/dL (ref 3.8–4.8)
Alkaline Phosphatase: 98 IU/L (ref 48–121)
BUN/Creatinine Ratio: 23 (ref 12–28)
BUN: 45 mg/dL — ABNORMAL HIGH (ref 8–27)
Bilirubin Total: <0.2 mg/dL (ref 0.0–1.2)
CO2: 22 mmol/L (ref 20–29)
Calcium: 9.3 mg/dL (ref 8.7–10.3)
Chloride: 98 mmol/L (ref 96–106)
Creatinine, Ser: 1.94 mg/dL — ABNORMAL HIGH (ref 0.57–1.00)
GFR calc Af Amer: 30 mL/min/{1.73_m2} — ABNORMAL LOW (ref >59)
GFR calc non Af Amer: 26 mL/min/{1.73_m2} — ABNORMAL LOW (ref >59)
Globulin, Total: 3.1 g/dL (ref 1.5–4.5)
Glucose: 120 mg/dL — ABNORMAL HIGH (ref 65–99)
Potassium: 5.2 mmol/L (ref 3.5–5.2)
Sodium: 135 mmol/L (ref 134–144)
Total Protein: 7.4 g/dL (ref 6.0–8.5)

## 2019-10-06 LAB — CBC
Hematocrit: 36.1 % (ref 34.0–46.6)
Hemoglobin: 11.5 g/dL (ref 11.1–15.9)
MCH: 25.8 pg — ABNORMAL LOW (ref 26.6–33.0)
MCHC: 31.9 g/dL (ref 31.5–35.7)
MCV: 81 fL (ref 79–97)
Platelets: 328 10*3/uL (ref 150–450)
RBC: 4.45 x10E6/uL (ref 3.77–5.28)
RDW: 13.6 % (ref 11.7–15.4)
WBC: 10.6 10*3/uL (ref 3.4–10.8)

## 2019-10-06 LAB — HEMOGLOBIN A1C
Est. average glucose Bld gHb Est-mCnc: 166 mg/dL
Hgb A1c MFr Bld: 7.4 % — ABNORMAL HIGH (ref 4.8–5.6)

## 2019-10-07 LAB — URINE CULTURE

## 2019-10-08 ENCOUNTER — Other Ambulatory Visit: Payer: Self-pay | Admitting: Internal Medicine

## 2019-10-08 ENCOUNTER — Telehealth: Payer: Self-pay

## 2019-10-08 MED ORDER — NITROFURANTOIN MONOHYD MACRO 100 MG PO CAPS
100.0000 mg | ORAL_CAPSULE | Freq: Two times a day (BID) | ORAL | 0 refills | Status: AC
Start: 2019-10-08 — End: 2019-10-13

## 2019-10-08 NOTE — Telephone Encounter (Signed)
Left vm for pt to return call for lab results  

## 2019-10-08 NOTE — Telephone Encounter (Signed)
-----   Message from [name], MD sent at 10/06/2019  9:36 PM EDT ----- Your urine culture is pending. I will let you know when these labs are available.   Your kidney function has decreased. Be sure to stay hydrated.  Your blood count is stable. Your hba1c is 7.4, improved from last visit. Keep up the great work!   Please let me know if you have any questions.   Take care,   Rs

## 2019-10-09 ENCOUNTER — Encounter: Payer: Self-pay | Admitting: Internal Medicine

## 2019-10-09 NOTE — Progress Notes (Signed)
This visit occurred during the SARS-CoV-2 public health emergency.  Safety protocols were in place, including screening questions prior to the visit, additional usage of staff PPE, and extensive cleaning of exam room while observing appropriate contact time as indicated for disinfecting solutions.  Subjective:     Patient ID: Yvonne Miller , female    DOB: 30-Aug-1950 , 69 y.o.   MRN: [id number]   Chief Complaint  Patient presents with  . Annual Exam  . Diabetes  . Hypertension    HPI  She is here today for a full physical examination. She is no longer followed by GYN. She has no specific concerns or complaints at this time.   Diabetes She presents for her follow-up diabetic visit. She has type 2 diabetes mellitus. Her disease course has been stable. There are no hypoglycemic associated symptoms. Pertinent negatives for diabetes include no blurred vision and no chest pain. There are no hypoglycemic complications. Diabetic complications include nephropathy. Risk factors for coronary artery disease include diabetes mellitus, dyslipidemia, hypertension, obesity and sedentary lifestyle. She is compliant with treatment some of the time. She is following a generally healthy diet. She participates in exercise intermittently. Her breakfast blood glucose is taken between 8-9 am. Her breakfast blood glucose range is generally 110-130 mg/dl. An ACE inhibitor/angiotensin II receptor blocker is being taken.  Hypertension This is a chronic problem. The current episode started more than 1 year ago. The problem has been gradually improving since onset. The problem is controlled. Pertinent negatives include no blurred vision, chest pain, palpitations or shortness of breath. Past treatments include ACE inhibitors, angiotensin blockers and diuretics. Identifiable causes of hypertension include sleep apnea.     Past Medical History:  Diagnosis Date  . AICD (automatic cardioverter/defibrillator) present   .  Arthritis   . Asthma   . Carpal tunnel syndrome, bilateral   . CKD (chronic kidney disease)   . Diabetes mellitus   . Encounter for assessment of implantable cardioverter-defibrillator (ICD)   . GERD (gastroesophageal reflux disease)   . Hyperlipemia   . Hypertension   . Obesity    morbid  . Sleep apnea    wears CPAP set at 12  . Wears glasses      Family History  Problem Relation Age of Onset  . Hypertension Mother   . Stroke Mother   . Heart disease Father   . Heart attack Sister   . Heart attack Brother      Current Outpatient Medications:  .  ACCU-CHEK AVIVA PLUS test strip, CHECK 4 TIMES BY INTRADERMAL ROUTE EVERY DAY, Disp: 200 strip, Rfl: 11 .  albuterol (PROVENTIL HFA;VENTOLIN HFA) 108 (90 Base) MCG/ACT inhaler, Inhale 2 puffs into the lungs every 4 (four) hours as needed for wheezing or shortness of breath. , Disp: , Rfl:  .  amLODipine (NORVASC) 5 MG tablet, TAKE 1 TABLET BY MOUTH  DAILY (Patient taking differently: Take 5 mg by mouth daily. ), Disp: 90 tablet, Rfl: 3 .  aspirin EC 81 MG tablet, Take 81 mg by mouth at bedtime. , Disp: , Rfl:  .  B Complex Vitamins (B COMPLEX-B12) TABS, Take 1 tablet by mouth daily., Disp: , Rfl:  .  carvedilol (COREG) 25 MG tablet, TAKE 1 AND 1/2 TABLETS BY  MOUTH TWO TIMES DAILY (Patient taking differently: Take 37.5 mg by mouth 2 (two) times daily. ), Disp: 270 tablet, Rfl: 3 .  Cholecalciferol (VITAMIN D) 50 MCG (2000 UT) tablet, Take 2,000 Units by mouth daily.,  Disp: , Rfl:  .  Chromium 1000 MCG TABS, Take 1,000 mcg by mouth daily. , Disp: , Rfl:  .  Cinnamon 500 MG capsule, Take 500 mg by mouth daily., Disp: , Rfl:  .  fluticasone (FLONASE) 50 MCG/ACT nasal spray, Place 1 spray into both nostrils daily. (Patient taking differently: Place 1 spray into both nostrils as needed for allergies. ), Disp: 16 g, Rfl: 2 .  hydrALAZINE (APRESOLINE) 50 MG tablet, TAKE 1 TABLET BY MOUTH  TWICE DAILY (Patient taking differently: Take 50 mg by  mouth 2 (two) times daily. ), Disp: 180 tablet, Rfl: 3 .  insulin lispro protamine-lispro (HUMALOG 75/25 MIX) (75-25) 100 UNIT/ML SUSP injection, Inject 30-40 Units into the skin daily before supper., Disp: , Rfl:  .  latanoprost (XALATAN) 0.005 % ophthalmic solution, Place 1 drop into both eyes at bedtime. , Disp: , Rfl:  .  nystatin (NYSTATIN) powder, Apply topically 3 (three) times daily as needed. As needed (Patient taking differently: Apply 1 g topically 3 (three) times daily as needed (yeast). ), Disp: 60 g, Rfl: 2 .  Omega-3 Fatty Acids (FISH OIL) 500 MG CAPS, Take 500 mg by mouth daily., Disp: , Rfl:  .  omeprazole (PRILOSEC) 40 MG capsule, Take 40 mg by mouth daily., Disp: , Rfl:  .  pravastatin (PRAVACHOL) 40 MG tablet, TAKE 1 TABLET BY MOUTH  EVERY DAY (Patient taking differently: Take 40 mg by mouth at bedtime. ), Disp: 90 tablet, Rfl: 3 .  Probiotic Product (PROBIOTIC PO), Take by mouth., Disp: , Rfl:  .  spironolactone (ALDACTONE) 50 MG tablet, Take 50 mg by mouth daily., Disp: , Rfl:  .  TRULICITY 1.5 TO/6.7TI SOPN, INJECT 1.5 MG INTO THE SKIN ONCE A WEEK. (Patient taking differently: Inject 1.5 mg as directed every Wednesday. ), Disp: 2 pen, Rfl: 2 .  valsartan-hydrochlorothiazide (DIOVAN-HCT) 160-25 MG tablet, TAKE 1 TABLET BY MOUTH  DAILY, Disp: 90 tablet, Rfl: 3 .  nitrofurantoin, macrocrystal-monohydrate, (MACROBID) 100 MG capsule, Take 1 capsule (100 mg total) by mouth 2 (two) times daily for 5 days., Disp: 10 capsule, Rfl: 0   Allergies  Allergen Reactions  . Other Other (See Comments)    NO BLOOD   . Jehovah witness     The patient states she uses post menopausal status for birth control. Last LMP was No LMP recorded. Patient has had a hysterectomy.. Negative for Dysmenorrhea Negative for: breast discharge, breast lump(s), breast pain and breast self exam. Associated symptoms include abnormal vaginal bleeding. Pertinent negatives include abnormal bleeding (hematology),  anxiety, decreased libido, depression, difficulty falling sleep, dyspareunia, history of infertility, nocturia, sexual dysfunction, sleep disturbances, urinary incontinence, urinary urgency, vaginal discharge and vaginal itching. Diet regular.The patient states her exercise level is  intermittent.   . The patient's tobacco use is:  Social History   Tobacco Use  Smoking Status Never Smoker  Smokeless Tobacco Never Used  . She has been exposed to passive smoke. The patient's alcohol use is:  Social History   Substance and Sexual Activity  Alcohol Use No   Review of Systems  Constitutional: Negative.   HENT: Negative.   Eyes: Negative.  Negative for blurred vision.  Respiratory: Negative.  Negative for shortness of breath.   Cardiovascular: Negative.  Negative for chest pain and palpitations.  Endocrine: Negative.   Genitourinary: Negative.   Musculoskeletal: Negative.   Skin: Negative.   Allergic/Immunologic: Negative.   Neurological: Negative.   Hematological: Negative.   Psychiatric/Behavioral: Negative.  Today's Vitals   10/05/19 0851  BP: 112/76  Pulse: 76  Temp: 97.6 F (36.4 C)  TempSrc: Oral  Weight: 257 lb 6.4 oz (116.8 kg)  Height: 5' 2.6" (1.59 m)  PainSc: 0-No pain   Body mass index is 46.18 kg/m.   Wt Readings from Last 3 Encounters:  10/05/19 257 lb 6.4 oz (116.8 kg)  09/24/19 261 lb (118.4 kg)  09/01/19 251 lb (113.9 kg)     Objective:  Physical Exam Vitals and nursing note reviewed.  Constitutional:      Appearance: Normal appearance. She is obese.  HENT:     Head: Normocephalic and atraumatic.     Right Ear: Ear canal and external ear normal. There is impacted cerumen.     Left Ear: Tympanic membrane, ear canal and external ear normal. There is no impacted cerumen.     Nose:     Comments: Deferred, masked    Mouth/Throat:     Comments: Deferred, masked Eyes:     Extraocular Movements: Extraocular movements intact.      Conjunctiva/sclera: Conjunctivae normal.     Pupils: Pupils are equal, round, and reactive to light.  Cardiovascular:     Rate and Rhythm: Normal rate and regular rhythm.     Pulses: Normal pulses.          Dorsalis pedis pulses are 2+ on the right side and 2+ on the left side.     Heart sounds: Normal heart sounds.  Pulmonary:     Effort: Pulmonary effort is normal.     Breath sounds: Normal breath sounds.  Chest:     Breasts: Tanner Score is 5.        Right: Normal.        Left: Normal.     Comments: Pendulous Abdominal:     General: Bowel sounds are normal.     Palpations: Abdomen is soft.     Comments: Obese, soft. Difficult to assess organomegaly. Healed surgical scar.   Genitourinary:    Comments: deferred Musculoskeletal:        General: Normal range of motion.     Cervical back: Normal range of motion and neck supple.  Feet:     Right foot:     Protective Sensation: 5 sites tested. 5 sites sensed.     Skin integrity: Callus and dry skin present.     Toenail Condition: Right toenails are abnormally thick.     Left foot:     Protective Sensation: 5 sites tested. 5 sites sensed.     Skin integrity: Callus and dry skin present.     Toenail Condition: Left toenails are abnormally thick.  Skin:    General: Skin is warm and dry.  Neurological:     General: No focal deficit present.     Mental Status: She is alert and oriented to person, place, and time.  Psychiatric:        Mood and Affect: Mood normal.        Behavior: Behavior normal.         Assessment And Plan:     1. Routine general medical examination at health care facility  A full exam was performed. Importance of monthly self breast exams was discussed with the patient .PATIENT IS ADVISED TO GET 30-45 MINUTES REGULAR EXERCISE NO LESS THAN FOUR TO FIVE DAYS PER WEEK - BOTH WEIGHTBEARING EXERCISES AND AEROBIC ARE RECOMMENDED.  SHE IS ADVISED TO FOLLOW A HEALTHY DIET WITH AT LEAST SIX FRUITS/VEGGIES  PER DAY,  DECREASE INTAKE OF RED MEAT, AND TO INCREASE FISH INTAKE TO TWO DAYS PER WEEK.  MEATS/FISH SHOULD NOT BE FRIED, BAKED OR BROILED IS PREFERABLE.  I SUGGEST WEARING SPF 50 SUNSCREEN ON EXPOSED PARTS AND ESPECIALLY WHEN IN THE DIRECT SUNLIGHT FOR AN EXTENDED PERIOD OF TIME.  PLEASE AVOID FAST FOOD RESTAURANTS AND INCREASE YOUR WATER INTAKE.   2. Type 2 diabetes mellitus with stage 3 chronic kidney disease, with long-term current use of insulin, unspecified whether stage 3a or 3b CKD ([place])  Diabetic foot exam was performed. I DISCUSSED WITH THE PATIENT AT LENGTH REGARDING THE GOALS OF GLYCEMIC CONTROL AND POSSIBLE LONG-TERM COMPLICATIONS.  I  ALSO STRESSED THE IMPORTANCE OF COMPLIANCE WITH HOME GLUCOSE MONITORING, DIETARY RESTRICTIONS INCLUDING AVOIDANCE OF SUGARY DRINKS/PROCESSED FOODS,  ALONG WITH REGULAR EXERCISE.  I  ALSO STRESSED THE IMPORTANCE OF ANNUAL EYE EXAMS, SELF FOOT CARE AND COMPLIANCE WITH OFFICE VISITS.  - CMP14+EGFR - CBC - Hemoglobin A1c - POCT Urinalysis Dipstick (81002) - POCT UA - Microalbumin  3. Hypertensive heart and renal disease with renal failure, stage 1 through stage 4 or unspecified chronic kidney disease, with heart failure (HCC)  Chronic, well controlled. She will continue with current meds. She is encouraged to avoid adding salt to her foods. EKG not performed. There is one that was performed earlier this year with Cardiology, this was reviewed.   4. Chronic combined systolic and diastolic congestive heart failure (HCC)  Chronic, yet stable. Importance of following salt restricted diet was stressed to the patient.   5. Right ear impacted cerumen  AFTER OBTAINING VERBAL CONSENT, RIGHT EAR WAS FLUSHED BY IRRIGATION. SHE TOLERATED PROCEDURE WELL WITHOUT ANY COMPLICATIONS. NO TM ABNORMALITIES WERE NOTED.  - Ear Lavage  6. Adrenal nodule ([place])  Incidental finding on CT. She agrees to repeat CT w/ adrenal protocol. I will make further recommendations once her labs  are available for review.   - CT ADRENAL ABD WO; Future  7. Abnormal urine finding  After her visit, she c/o dysuria. I will send off urine culture.   - Culture, Urine  8. Class 3 severe obesity due to excess calories with serious comorbidity and body mass index (BMI) of 45.0 to 49.9 in adult (HCC)  BMI 46. She is encouraged to strive for BMI less than 40 to decrease cardiac risk. She is encouraged to perform chair exercises while watching TV. Also advised to increase daily activity.   Maximino Greenland, MD    THE PATIENT IS ENCOURAGED TO PRACTICE SOCIAL DISTANCING DUE TO THE COVID-19 PANDEMIC.

## 2019-10-11 ENCOUNTER — Other Ambulatory Visit: Payer: Self-pay

## 2019-10-11 ENCOUNTER — Ambulatory Visit (HOSPITAL_BASED_OUTPATIENT_CLINIC_OR_DEPARTMENT_OTHER)
Admission: RE | Admit: 2019-10-11 | Discharge: 2019-10-11 | Disposition: A | Discharge status: Home / Self Care | Payer: Medicare Other | Source: Ambulatory Visit | Attending: Internal Medicine | Admitting: Internal Medicine

## 2019-10-11 DIAGNOSIS — E278 Other specified disorders of adrenal gland: Secondary | ICD-10-CM | POA: Diagnosis not present

## 2019-10-11 DIAGNOSIS — R911 Solitary pulmonary nodule: Secondary | ICD-10-CM | POA: Insufficient documentation

## 2019-10-11 DIAGNOSIS — I700 Atherosclerosis of aorta: Secondary | ICD-10-CM | POA: Diagnosis not present

## 2019-10-11 DIAGNOSIS — D3502 Benign neoplasm of left adrenal gland: Secondary | ICD-10-CM | POA: Diagnosis not present

## 2019-10-13 ENCOUNTER — Other Ambulatory Visit: Payer: Self-pay | Admitting: Internal Medicine

## 2019-10-13 ENCOUNTER — Encounter: Payer: Self-pay | Admitting: Internal Medicine

## 2019-10-13 DIAGNOSIS — E1122 Type 2 diabetes mellitus with diabetic chronic kidney disease: Secondary | ICD-10-CM

## 2019-10-13 DIAGNOSIS — N183 Chronic kidney disease, stage 3 unspecified: Secondary | ICD-10-CM

## 2019-10-13 DIAGNOSIS — Z794 Long term (current) use of insulin: Secondary | ICD-10-CM

## 2019-10-13 NOTE — Progress Notes (Signed)
Ref

## 2019-10-14 ENCOUNTER — Telehealth: Payer: Self-pay

## 2019-10-20 DIAGNOSIS — Z4502 Encounter for adjustment and management of automatic implantable cardiac defibrillator: Secondary | ICD-10-CM | POA: Diagnosis not present

## 2019-10-20 DIAGNOSIS — I5022 Chronic systolic (congestive) heart failure: Secondary | ICD-10-CM | POA: Diagnosis not present

## 2019-10-20 DIAGNOSIS — Z95810 Presence of automatic (implantable) cardiac defibrillator: Secondary | ICD-10-CM | POA: Diagnosis not present

## 2019-10-22 ENCOUNTER — Other Ambulatory Visit: Payer: Self-pay | Admitting: Cardiology

## 2019-11-03 ENCOUNTER — Telehealth: Payer: Medicare Other

## 2019-11-03 NOTE — Chronic Care Management (AMB) (Deleted)
Chronic Care Management Pharmacy  Name: Yvonne Miller  MRN: [id number] DOB: 24-Nov-1950  Chief Complaint/ HPI  Yvonne Miller,  69 y.o. , female presents for their Initial CCM visit with the clinical pharmacist {CHL HP Upstream Pharm visit NUUV:[id number]}.  PCP : [name], MD  Their chronic conditions include: Hypertensive heart and renal disease, non-ischemic cardiomyopathy, HTN, Type 2 DM, OA of left knee, chronic renal disease stage 2, HLD, sleep apnea, GERD, Asthma.   Office Visits:*** 10/13/2019 - adrenal CT was normal. Referral to foot doctor.  10/05/2019 - CMP14+EGFR, CBC, Hemoglobin A1c, POCT Urinalysis Dipstick (81002), POCT UA, Microalbumin ordered. Ear lavage performed for impacted cerumen. Adrenal nodule - repeat CT with adrenal protocol. Complaint of dysuria - ordered macrobid bid x5 days.  06/16/2019 - Medicare AWV - set goal for weight loss.  06/16/2019 - Checked CMP14+EGFR, Lipid panel, Hemoglobin, A1c. No med changes. kidney function is stable. HDL, good cholesterol, is low. A1C 7.7%. Recommending exercise/weight loss.  Consult Visit:*** 09/24/2019 - ED - evaluation after fall. Wrist x-ray didn't reveal anything. Recommend repeat x-ray in 7-10 days. Placed in a thumb spica brace.  09/01/2019 - Dr. Einar Gip - No change in medications. Renal function has remained stable.  07/27/2019- Remote ICD check - No HVR episodes. Health trends (patient activity, heart rate variability, average heart rates) are stable. The review of the implantable cardiac. 07/13/2019 - likely benign mammogram.  06/28/2019 - Eye appt Dr. 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 CCM Visits:  08/08/2019 - RN provided education about renal function and CKD. Evaluation of HTN/CHF. Education on target BP. Education on DM and goal a1c.  06/03/2019 - pharmacist visit - no medication changes. Monitoring fill history of statin.  05/23/2019 - Patient approved for Trulicity/Humalog 74/25-95/63.   05/11/2019 - Follow-up call to [facility] about Trulicity and Humalog. Application being processed.      Medications: Outpatient Encounter Medications as of 11/03/2019  Medication Sig  . ACCU-CHEK AVIVA PLUS test strip CHECK 4 TIMES BY INTRADERMAL ROUTE EVERY DAY  . albuterol (PROVENTIL HFA;VENTOLIN HFA) 108 (90 Base) MCG/ACT inhaler Inhale 2 puffs into the lungs every 4 (four) hours as needed for wheezing or shortness of breath.   [name] amLODipine (NORVASC) 5 MG tablet TAKE 1 TABLET BY MOUTH  DAILY (Patient taking differently: Take 5 mg by mouth daily. )  . aspirin EC 81 MG tablet Take 81 mg by mouth at bedtime.   . B Complex Vitamins (B COMPLEX-B12) TABS Take 1 tablet by mouth daily.  . carvedilol (COREG) 25 MG tablet TAKE 1 AND 1/2 TABLETS BY  MOUTH TWO TIMES DAILY (Patient taking differently: Take 37.5 mg by mouth 2 (two) times daily. )  . Cholecalciferol (VITAMIN D) 50 MCG (2000 UT) tablet Take 2,000 Units by mouth daily.  . Chromium 1000 MCG TABS Take 1,000 mcg by mouth daily.   . Cinnamon 500 MG capsule Take 500 mg by mouth daily.  . fluticasone (FLONASE) 50 MCG/ACT nasal spray Place 1 spray into both nostrils daily. (Patient taking differently: Place 1 spray into both nostrils as needed for allergies. )  . hydrALAZINE (APRESOLINE) 50 MG tablet TAKE 1 TABLET BY MOUTH  TWICE DAILY  . insulin lispro protamine-lispro (HUMALOG 75/25 MIX) (75-25) 100 UNIT/ML SUSP injection Inject 30-40 Units into the skin daily before supper.  . latanoprost (XALATAN) 0.005 % ophthalmic solution Place 1 drop into both eyes at bedtime.   [name] nystatin (NYSTATIN) powder Apply topically 3 (three) times daily  as needed. As needed (Patient taking differently: Apply 1 g topically 3 (three) times daily as needed (yeast). )  . Omega-3 Fatty Acids (FISH OIL) 500 MG CAPS Take 500 mg by mouth daily.  [name] omeprazole (PRILOSEC) 40 MG capsule Take 40 mg by mouth daily.  . pravastatin (PRAVACHOL) 40 MG tablet TAKE 1 TABLET BY MOUTH   EVERY DAY (Patient taking differently: Take 40 mg by mouth at bedtime. )  . Probiotic Product (PROBIOTIC PO) Take by mouth.  . spironolactone (ALDACTONE) 50 MG tablet Take 50 mg by mouth daily.  . TRULICITY 1.5 GH/8.2XH SOPN INJECT 1.5 MG INTO THE SKIN ONCE A WEEK. (Patient taking differently: Inject 1.5 mg as directed every Wednesday. )  . valsartan-hydrochlorothiazide (DIOVAN-HCT) 160-25 MG tablet TAKE 1 TABLET BY MOUTH  DAILY   No facility-administered encounter medications on file as of 11/03/2019.   Allergies  Allergen Reactions  . Other Other (See Comments)    NO BLOOD   . Jehovah witness     SDOH Screenings   Alcohol Screen:   . Last Alcohol Screening Score (AUDIT):   Depression (PHQ2-9): Low Risk   . PHQ-2 Score: 0  Financial Resource Strain: Low Risk   . Difficulty of Paying Living Expenses: Not hard at all  Food Insecurity: No Food Insecurity  . Worried About Charity fundraiser in the Last Year: Never true  . Ran Out of Food in the Last Year: Never true  Housing: Low Risk   . Last Housing Risk Score: 0  Physical Activity: Inactive  . Days of Exercise per Week: 0 days  . Minutes of Exercise per Session: 0 min  Social Connections:   . Frequency of Communication with Friends and Family:   . Frequency of Social Gatherings with Friends and Family:   . Attends Religious Services:   . Active Member of Clubs or Organizations:   . Attends Archivist Meetings:   [name] Marital Status:   Stress: No Stress Concern Present  . Feeling of Stress : Not at all  Tobacco Use: Low Risk   . Smoking Tobacco Use: Never Smoker  . Smokeless Tobacco Use: Never Used  Transportation Needs: No Transportation Needs  . Lack of Transportation (Medical): No  . Lack of Transportation (Non-Medical): No     Current Diagnosis/Assessment:  Goals Addressed   None     COPD / Asthma / Tobacco   Last spirometry score: ***  Gold Grade: {CHL HP Upstream Pharm COPD Gold  BZJIR:[id number]} Current COPD Classification:  {CHL HP Upstream Pharm COPD Classification:[phone]}  Eosinophil count:   Lab Results  Component Value Date/Time   EOSPCT 3 06/01/2017 10:35 AM  %                               Eos (Absolute):  Lab Results  Component Value Date/Time   EOSABS 0.3 06/01/2017 10:35 AM    Tobacco Status:  Social History   Tobacco Use  Smoking Status Never Smoker  Smokeless Tobacco Never Used    Patient has failed these meds in past: promethazine-codeine  Patient is currently {CHL Controlled/Uncontrolled:[phone]} on the following medications: albuterol inhaler 2 puffs q4h prn sob/wheezine, fluticasone nasal spray 1 spray both nostrils daily prn Using maintenance inhaler regularly? {yes/no:20286} Frequency of rescue inhaler use:  {CHL HP Upstream Pharm Inhaler PZWC:5852778242}  We discussed:  {CHL HP Upstream Pharmacy discussion:3867598069}  Plan  Continue {CHL HP Upstream Pharmacy Plans:[phone]} ,  Hyperlipidemia   LDL goal < ***  Lipid Panel     Component Value Date/Time   CHOL 135 06/16/2019 0923   TRIG 145 06/16/2019 0923   HDL 37 (L) 06/16/2019 0923   LDLCALC 73 06/16/2019 0923    Hepatic Function Latest Ref Rng & Units 10/05/2019 06/16/2019 09/30/2018  Total Protein 6.0 - 8.5 g/dL 7.4 7.0 7.4  Albumin 3.8 - 4.8 g/dL 4.3 4.1 4.6  AST 0 - 40 IU/L '15 14 12  '$ ALT 0 - 32 IU/L '13 13 16  '$ Alk Phosphatase 48 - 121 IU/L 98 107 99  Total Bilirubin 0.0 - 1.2 mg/dL <0.2 <0.2 0.2     The 10-year ASCVD risk score Mikey Bussing DC Jr., et al., 2013) is: 14.5%   Values used to calculate the score:     Age: 28 years     Sex: Female     Is Non-Hispanic African American: Yes     Diabetic: Yes     Tobacco smoker: No     Systolic Blood Pressure: 885 mmHg     Is BP treated: Yes     HDL Cholesterol: 37 mg/dL     Total Cholesterol: 135 mg/dL   Patient has failed these meds in past: rosuvastatin  Patient is currently {CHL  Controlled/[phone]} on the following medications:  . Aspirin EC 81 mg daily at bedtime . Pravastatin 40 mg daily at bedtime  We discussed:  {CHL HP Upstream Pharmacy discussion:[phone]}  Plan  Continue {CHL HP Upstream Pharmacy OYDXA:[id number]}   Diabetes   Recent Relevant Labs: Lab Results  Component Value Date/Time   HGBA1C 7.4 (H) 10/05/2019 10:14 AM   HGBA1C 7.7 (H) 06/16/2019 09:23 AM   MICROALBUR 30 10/05/2019 12:07 PM   MICROALBUR 10 09/30/2018 03:26 PM     Checking BG: {CHL HP Blood Glucose Monitoring Frequency:([phone]}  Recent FBG Readings: Recent pre-meal BG readings: *** Recent 2hr PP BG readings:  *** Recent HS BG readings: *** Patient has failed these meds in past: levemir, novolin 70/30, metformin, onglyza, januvia Patient is currently {CHL Controlled/[phone]} on the following medications: humalog 75/25 30-40 units before supper, trulicity 1.5 mg every Wednesday, accu-chek aviva plus test strip qid, cinammon 500 mg daily  Last diabetic Foot exam:  Lab Results  Component Value Date/Time   HMDIABEYEEXA No Retinopathy 12/02/2018 12:00 AM    Last diabetic Eye exam: No results found for: HMDIABFOOTEX   We discussed: {CHL HP Upstream Pharmacy discussion:[phone]}  Plan  Continue {CHL HP Upstream Pharmacy [phone]},  Hypertension   BP today is:  {CHL HP UPSTREAM Pharmacist BP [phone]}  Office blood pressures are  BP Readings from Last 3 Encounters:  10/05/19 112/76  09/24/19 129/77  09/01/19 128/76    Patient has failed these meds in the past: furosemide, olmesartan-hctz 40-25  Patient is currently controlled/uncontrolled*** on the following medications: amlodipine 5 mg daily, carvedilol 37.5 mg bid, hydralazine 50 mg bid, spironolactone 50 mg daily, valsartan-hctz 160-25 mg daily Patient checks BP at home {CHL HP BP Monitoring Frequency:[phone]}  Patient home BP readings are ranging: ***   We discussed {CHL HP Upstream Pharmacy discussion:[phone]}  Plan  Continue {CHL HP Upstream Pharmacy [phone]}     and  Other Diagnosis:GERD    Patient has failed these meds in past: *** Patient is currently {CHL Controlled/[phone]} on the following medications: omeprazole 40 mg daily, probiotic ***  We discussed:  {CHL HP Upstream Pharmacy discussion:[phone]}  Plan  Continue {CHL HP Upstream Pharmacy [phone]}  Osteopenia / Osteoporosis  Last DEXA Scan: 09/17/2017 - normal  T-Score femoral neck: - 0.70  T-Score lumbar spine: +0.70    No results found for: VD25OH   Patient is not a candidate for pharmacologic treatment  Patient has failed these meds in past: *** Patient is currently {CHL Controlled/Uncontrolled:[phone]} on the following medications:  . ***  We discussed:  {Osteoporosis Counseling:23892}. Repeat scan in 2-4 years per Dr. Baird Cancer.   Plan  Continue {CHL HP Upstream Pharmacy [phone]}   Health Maintenance   Patient is currently {CHL Controlled/Uncontrolled:[phone]} on the following medications:  . b complex vitamins daily - *** . Cholecalciferol 2000 units daily - *** . Chromium 1000 mcg daily - *** . Omega 3 fatty acids 500 mg daily - *** . Nystatin powder tid prn - yeast . Latanoprost 0.005% - glaucoma  We discussed:  ***  Plan  Continue {CHL HP Upstream Pharmacy PZPSU:[id number]}  Vaccines   Reviewed and discussed patient's vaccination history.    Immunization History  Administered Date(s) Administered  . Hepatitis A 01/19/2012, 02/16/2012  . Hepatitis B 01/19/2012, 02/16/2012  . IPV 02/16/2012  . Influenza, High Dose Seasonal PF 02/17/2016, 03/04/2018, 02/10/2019  . Influenza-Unspecified 02/02/2014, 02/16/2014  . MMR 04/04/1994  . PFIZER SARS-COV-2 Vaccination 06/11/2019, 07/02/2019  . Td 09/08/2006  . Tdap 01/19/2012  . Zoster 12/15/2007    Plan  Recommended patient  receive *** vaccine in *** office.   Medication Management   [facility] Uses pill box? {Yes or If no, why not?:20788} Pt endorses ***% compliance  We discussed: ***  Plan  {US Pharmacy TCCE:83374}    Follow up: *** month phone visit  ***

## 2019-11-03 NOTE — Chronic Care Management (AMB) (Deleted)
Chronic Care Management Pharmacy  Name: Yvonne Miller  MRN: [id number] DOB: 1950-07-17  Chief Complaint/ HPI  Yvonne Miller,  69 y.o. , female presents for their Initial CCM visit with the clinical pharmacist via telephone due to COVID-19 Pandemic.  PCP : [name], MD  Their chronic conditions include: Hypertensive heart and renal disease, Combined systolic and diastolic CHF, non-ischemic cardiomyopathy, Hypertension, Type 2 Diabetes mellitus, OA of left knee, CKD stage 2, Hyperlipidemia, sleep apnea, GERD, Asthma.   Office Visits: 10/13/2019 pt message: Notified that adrenal CT was normal. Referral to podiatry.  10/05/2019 OV: Presented for annual exam and DM and HTN follow up. Labs ordered (CMP14+EGFR, CBC, HgbA1c, Urinalysis, UA Microalbumin). Ear lavage performed for impacted cerumen. Diabetic foot exam performed. 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 Complaint of dysuria - ordered macrobid bid x5 days.   06/16/2019 AWV and OV: Set goal for weight loss. Labs ordered (CMP14+EGFR, Lipid panel, HgbA1c). No med changes. kidney function is stable. HDL, good cholesterol, is low. A1C 7.7%. Recommending exercise/weight loss.   Consult Visits: 09/24/2019 ED visit: Presented for evaluation of right wrist pain after fall. Xray did not show acute injury. Recommend repeat x-ray in 7-10 days. Placed in a thumb spica brace. Follow up with orthopedic doctor.  09/01/2019 Cardiology OV w/ Dr. Einar Gip: Presented for follow up for CHF and HTN. Denies dyspnea. Mild leg edema. No change in medications. Renal function has remained stable. No clinical evidence of heart failure on exam. Follow up in 1 year.  07/27/2019- Remote ICD check - No HVR episodes. Health trends (patient activity, heart rate variability, average heart rates) are stable. The review of the implantable cardiac data monitoring remains stable.  07/13/2019 - likely benign mammogram.   06/28/2019 Optometry OV  w/ Dr. Idolina Primer: Primary open-angle glaucoma. No note visible.  CCM Visits:  08/08/2019 RN: provided education about renal function and CKD. Evaluation of HTN/CHF. Education on target BP. Education on DM and goal a1c.   06/03/2019 PharmD: no medication changes. Monitoring fill history of statin.   05/23/2019 - Patient approved for Trulicity/Humalog 93/81-82/99.   05/11/2019 - Follow-up call to [facility] about Trulicity and Humalog. Application being processed.   Medications: Outpatient Encounter Medications as of 11/03/2019  Medication Sig  . ACCU-CHEK AVIVA PLUS test strip CHECK 4 TIMES BY INTRADERMAL ROUTE EVERY DAY  . albuterol (PROVENTIL HFA;VENTOLIN HFA) 108 (90 Base) MCG/ACT inhaler Inhale 2 puffs into the lungs every 4 (four) hours as needed for wheezing or shortness of breath.   [name] amLODipine (NORVASC) 5 MG tablet TAKE 1 TABLET BY MOUTH  DAILY (Patient taking differently: Take 5 mg by mouth daily. )  . aspirin EC 81 MG tablet Take 81 mg by mouth at bedtime.   . B Complex Vitamins (B COMPLEX-B12) TABS Take 1 tablet by mouth daily.  . carvedilol (COREG) 25 MG tablet TAKE 1 AND 1/2 TABLETS BY  MOUTH TWO TIMES DAILY (Patient taking differently: Take 37.5 mg by mouth 2 (two) times daily. )  . Cholecalciferol (VITAMIN D) 50 MCG (2000 UT) tablet Take 2,000 Units by mouth daily.  . Chromium 1000 MCG TABS Take 1,000 mcg by mouth daily.   . Cinnamon 500 MG capsule Take 500 mg by mouth daily.  . fluticasone (FLONASE) 50 MCG/ACT nasal spray Place 1 spray into both nostrils daily. (Patient taking differently: Place 1 spray into both nostrils as needed for allergies. )  . hydrALAZINE (APRESOLINE) 50 MG tablet TAKE 1  TABLET BY MOUTH  TWICE DAILY  . insulin lispro protamine-lispro (HUMALOG 75/25 MIX) (75-25) 100 UNIT/ML SUSP injection Inject 30-40 Units into the skin daily before supper.  . latanoprost (XALATAN) 0.005 % ophthalmic solution Place 1 drop into both eyes at bedtime.   [name] nystatin (NYSTATIN)  powder Apply topically 3 (three) times daily as needed. As needed (Patient taking differently: Apply 1 g topically 3 (three) times daily as needed (yeast). )  . Omega-3 Fatty Acids (FISH OIL) 500 MG CAPS Take 500 mg by mouth daily.  [name] omeprazole (PRILOSEC) 40 MG capsule Take 40 mg by mouth daily.  . pravastatin (PRAVACHOL) 40 MG tablet TAKE 1 TABLET BY MOUTH  EVERY DAY (Patient taking differently: Take 40 mg by mouth at bedtime. )  . Probiotic Product (PROBIOTIC PO) Take by mouth.  . spironolactone (ALDACTONE) 50 MG tablet Take 50 mg by mouth daily.  . TRULICITY 1.5 WU/9.8JX SOPN INJECT 1.5 MG INTO THE SKIN ONCE A WEEK. (Patient taking differently: Inject 1.5 mg as directed every Wednesday. )  . valsartan-hydrochlorothiazide (DIOVAN-HCT) 160-25 MG tablet TAKE 1 TABLET BY MOUTH  DAILY   No facility-administered encounter medications on file as of 11/03/2019.   Allergies  Allergen Reactions  . Other Other (See Comments)    NO BLOOD   . Jehovah witness     SDOH Screenings   Alcohol Screen:   . Last Alcohol Screening Score (AUDIT):   Depression (PHQ2-9): Low Risk   . PHQ-2 Score: 0  Financial Resource Strain: Low Risk   . Difficulty of Paying Living Expenses: Not hard at all  Food Insecurity: No Food Insecurity  . Worried About Charity fundraiser in the Last Year: Never true  . Ran Out of Food in the Last Year: Never true  Housing: Low Risk   . Last Housing Risk Score: 0  Physical Activity: Inactive  . Days of Exercise per Week: 0 days  . Minutes of Exercise per Session: 0 min  Social Connections:   . Frequency of Communication with Friends and Family:   . Frequency of Social Gatherings with Friends and Family:   . Attends Religious Services:   . Active Member of Clubs or Organizations:   . Attends Archivist Meetings:   [name] Marital Status:   Stress: No Stress Concern Present  . Feeling of Stress : Not at all  Tobacco Use: Low Risk   . Smoking Tobacco Use: Never Smoker    . Smokeless Tobacco Use: Never Used  Transportation Needs: No Transportation Needs  . Lack of Transportation (Medical): No  . Lack of Transportation (Non-Medical): No     Current Diagnosis/Assessment:  Goals Addressed   None     COPD / Asthma / Tobacco   Last spirometry score: None available  Gold Grade: {CHL HP Upstream Pharm COPD Gold BJYNW:[id number]} Current COPD Classification:  {CHL HP Upstream Pharm COPD Classification:[phone]}  Eosinophil count:   Lab Results  Component Value Date/Time   EOSPCT 3 06/01/2017 10:35 AM  %                               Eos (Absolute):  Lab Results  Component Value Date/Time   EOSABS 0.3 06/01/2017 10:35 AM    Tobacco Status:  Social History   Tobacco Use  Smoking Status Never Smoker  Smokeless Tobacco Never Used    Patient has failed these meds in past: promethazine-codeine  Patient is  currently {CHL Controlled/Uncontrolled:9284369562} on the following medications: albuterol inhaler 2 puffs q4h prn sob/wheezine, fluticasone nasal spray 1 spray both nostrils daily prn Using maintenance inhaler regularly? {yes/no:20286} Frequency of rescue inhaler use:  {CHL HP Upstream Pharm Inhaler EFEO:7121975883}  We discussed:  {CHL HP Upstream Pharmacy discussion:[phone]}  Plan  Continue {CHL HP Upstream Pharmacy Plans:[phone]} ,  Hyperlipidemia   LDL goal < ***  Lipid Panel     Component Value Date/Time   CHOL 135 06/16/2019 0923   TRIG 145 06/16/2019 0923   HDL 37 (L) 06/16/2019 0923   LDLCALC 73 06/16/2019 0923    Hepatic Function Latest Ref Rng & Units 10/05/2019 06/16/2019 09/30/2018  Total Protein 6.0 - 8.5 g/dL 7.4 7.0 7.4  Albumin 3.8 - 4.8 g/dL 4.3 4.1 4.6  AST 0 - 40 IU/L _0 ALT 0 - 32 IU/L _1 Alk Phosphatase 48 - 121 IU/L 98 107 99  Total Bilirubin 0.0 - 1.2 mg/dL <0.2 <0.2 0.2     The 10-year ASCVD risk score Mikey Bussing DC Jr., et al., 2013) is: 14.5%   Values used to calculate the score:      Age: 59 years     Sex: Female     Is Non-Hispanic African American: Yes     Diabetic: Yes     Tobacco smoker: No     Systolic Blood Pressure: 254 mmHg     Is BP treated: Yes     HDL Cholesterol: 37 mg/dL     Total Cholesterol: 135 mg/dL   Patient has failed these meds in past: rosuvastatin  Patient is currently controlled on the following medications:  . Pravastatin 40 mg daily at bedtime  Omega 3 fatty acids 500 mg daily . Aspirin EC 81 mg daily at bedtime  We discussed:  {CHL HP Upstream Pharmacy discussion:[phone]}  Plan  Continue {CHL HP Upstream Pharmacy DIYME:1583094076}   Diabetes   Recent Relevant Labs: Lab Results  Component Value Date/Time   HGBA1C 7.4 (H) 10/05/2019 10:14 AM   HGBA1C 7.7 (H) 06/16/2019 09:23 AM   MICROALBUR 30 10/05/2019 12:07 PM   MICROALBUR 10 09/30/2018 03:26 PM     Checking BG: {CHL HP Blood Glucose Monitoring Frequency:[phone]}  Recent FBG Readings: Recent pre-meal BG readings: *** Recent 2hr PP BG readings:  *** Recent HS BG readings: *** Patient has failed these meds in past: levemir, novolin 70/30, metformin, onglyza, januvia Patient is currently {CHL Controlled/Uncontrolled:[id number]} on the following medications:   Humalog 75/25 30-40 units before supper  Trulicity 1.5 mg every Wednesday  Cinammon 500 mg daily  Last diabetic Foot exam: 10/05/19 Last diabetic Eye exam: Lab Results  Component Value Date/Time   HMDIABEYEEXA No Retinopathy 12/02/2018 12:00 AM    We discussed: {CHL HP Upstream Pharmacy discussion:[phone]}  Plan Continue {CHL HP Upstream Pharmacy Plans:[phone]}   Hypertension   Office blood pressures are  BP Readings from Last 3 Encounters:  10/05/19 112/76  09/24/19 129/77  09/01/19 128/76   Patient has failed these meds in the past: Furosemide, olmesartan-hctz 40-25  Patient is currently {CHL Controlled/Uncontrolled:9284369562} on the following medications:   Amlodipine 5 mg  daily  Carvedilol 24m 1.5 tablets twice daily  Hydralazine 50 mg twice daily  Spironolactone 50 mg daily  Valsartan-hctz 160-25 mg daily   Patient checks BP at home {CHL HP BP Monitoring Frequency:[phone]}  Patient home BP readings are ranging: ***  We discussed {CHL HP Upstream Pharmacy discussion:[phone]}  Plan  Continue {CHL HP Upstream  Pharmacy BPJPE:[id number]}   GERD   Patient has failed these meds in past: *** Patient is currently {CHL Controlled/Uncontrolled:([phone]} on the following medications:   Omeprazole 40 mg daily  Probiotic ***  We discussed:  {CHL HP Upstream Pharmacy discussion:4424885120}  Plan  Continue {CHL HP Upstream Pharmacy [phone]}  Osteopenia / Osteoporosis   Last DEXA Scan: 09/17/2017 - normal  T-Score femoral neck: - 0.70  T-Score lumbar spine: +0.70  No results found for: VD25OH   Patient is not a candidate for pharmacologic treatment  Patient has failed these meds in past: *** Patient is currently {CHL Controlled/[phone]} on the following medications:  . Cholecalciferol 2000 units daily  We discussed:  {Osteoporosis Counseling:23892}. Repeat scan in 2-4 years per Dr. Baird Cancer.   Plan  Continue {CHL HP Upstream Pharmacy [phone]}   Health Maintenance   Patient is currently {CHL Controlled/[phone]} on the following medications:  . b complex vitamins daily - *** . Chromium 1000 mcg daily - *** . Nystatin powder tid prn - yeast . Latanoprost 0.005% - glaucoma  We discussed:  ***  Plan  Continue {CHL HP Upstream Pharmacy KUVJD:[id number]}  Vaccines   Reviewed and discussed patient's vaccination history.    Immunization History  Administered Date(s) Administered  . Hepatitis A 01/19/2012, 02/16/2012  . Hepatitis B 01/19/2012, 02/16/2012  . IPV 02/16/2012  . Influenza, High Dose Seasonal PF 02/17/2016, 03/04/2018, 02/10/2019  . Influenza-Unspecified  02/02/2014, 02/16/2014  . MMR 04/04/1994  . PFIZER SARS-COV-2 Vaccination 06/11/2019, 07/02/2019  . Td 09/08/2006  . Tdap 01/19/2012  . Zoster 12/15/2007    Plan Recommended patient receive *** vaccine in *** office.   Medication Management   Pt uses [facility] for all medications Uses pill box? {Yes or If no, why not?:20788} Pt endorses ***% compliance  We discussed: ***  Plan  {US Pharmacy PGFQ:42103}    Follow up: *** month phone visit  Jannette Fogo, PharmD Clinical Pharmacist [facility] [phone]

## 2019-11-04 ENCOUNTER — Telehealth: Payer: Self-pay | Admitting: Internal Medicine

## 2019-11-04 NOTE — Chronic Care Management (AMB) (Signed)
  Care Management   Note  11/04/2019 Name: Yvonne Miller MRN: [id number] DOB: [birth year]  Yvonne Miller is a 69 y.o. year old female who is a primary care patient of [name], MD and is actively engaged with the care management team. I reached out to Yvonne Miller by phone today to assist with re-scheduling an initial visit with the Pharmacist  Follow up plan: Telephone appointment with care management team member scheduled for: 12/05/2019  [place], [facility]  [place], [place] [address] Direct Dial: [email or web address] Website: [email or web address]

## 2019-11-05 ENCOUNTER — Other Ambulatory Visit: Payer: Self-pay | Admitting: Cardiology

## 2019-11-05 DIAGNOSIS — I10 Essential (primary) hypertension: Secondary | ICD-10-CM

## 2019-11-11 ENCOUNTER — Encounter: Payer: Self-pay | Admitting: Internal Medicine

## 2019-11-14 ENCOUNTER — Telehealth: Payer: Self-pay

## 2019-11-14 NOTE — Telephone Encounter (Signed)
Looked in chart for dispense hx of humalog

## 2019-11-15 ENCOUNTER — Ambulatory Visit (INDEPENDENT_AMBULATORY_CARE_PROVIDER_SITE_OTHER): Payer: Medicare Other

## 2019-11-15 ENCOUNTER — Other Ambulatory Visit: Payer: Self-pay

## 2019-11-15 DIAGNOSIS — N183 Chronic kidney disease, stage 3 unspecified: Secondary | ICD-10-CM

## 2019-11-15 DIAGNOSIS — Z794 Long term (current) use of insulin: Secondary | ICD-10-CM | POA: Diagnosis not present

## 2019-11-15 DIAGNOSIS — I130 Hypertensive heart and chronic kidney disease with heart failure and stage 1 through stage 4 chronic kidney disease, or unspecified chronic kidney disease: Secondary | ICD-10-CM

## 2019-11-15 DIAGNOSIS — E1122 Type 2 diabetes mellitus with diabetic chronic kidney disease: Secondary | ICD-10-CM | POA: Diagnosis not present

## 2019-11-15 MED ORDER — INSULIN LISPRO PROT & LISPRO (75-25 MIX) 100 UNIT/ML ~~LOC~~ SUSP: 30.0000 [IU] | Freq: Every day | SUBCUTANEOUS | 3 refills | Status: DC

## 2019-11-15 NOTE — Chronic Care Management (AMB) (Signed)
   Chronic Care Management Pharmacy  Name: Yvonne Miller  MRN: [id number] DOB: 09-09-1950  Chief Complaint/ HPI  Contacted [name] regarding patient's Humalog 75/25 mix prescription. Per representative, [name], at [facility] [employer] (fills and ships PAP medications for [name]) a new prescription is needed for the Humalog Mix 75/25. Collaborated with PCP staff via in basket to send in new prescription to pharmacy (Fax: [phone]). Called and left a HIPAA compliant voicemail for patient that a prescription for the Humalog 75/25 was sent to [name] today. Medication will most likely be delivered by the weekend.   PCP : [name], MD   Follow up: 12/05/19 initial phone visit  Jannette Fogo, PharmD Clinical Pharmacist [facility] [phone]

## 2019-11-17 ENCOUNTER — Telehealth: Payer: Self-pay

## 2019-11-21 DIAGNOSIS — E119 Type 2 diabetes mellitus without complications: Secondary | ICD-10-CM | POA: Diagnosis not present

## 2019-11-21 DIAGNOSIS — H401131 Primary open-angle glaucoma, bilateral, mild stage: Secondary | ICD-10-CM | POA: Diagnosis not present

## 2019-12-02 LAB — HM DIABETES EYE EXAM

## 2019-12-03 ENCOUNTER — Encounter: Payer: Self-pay | Admitting: Internal Medicine

## 2019-12-05 ENCOUNTER — Ambulatory Visit (INDEPENDENT_AMBULATORY_CARE_PROVIDER_SITE_OTHER): Payer: Medicare Other | Admitting: Internal Medicine

## 2019-12-05 ENCOUNTER — Telehealth: Payer: Medicare Other

## 2019-12-05 ENCOUNTER — Encounter: Payer: Self-pay | Admitting: Internal Medicine

## 2019-12-05 ENCOUNTER — Other Ambulatory Visit: Payer: Self-pay

## 2019-12-05 VITALS — BP 116/74 | HR 56 | Temp 98.1°F | Ht 62.6 in | Wt 263.8 lb

## 2019-12-05 DIAGNOSIS — Z23 Encounter for immunization: Secondary | ICD-10-CM

## 2019-12-05 DIAGNOSIS — R300 Dysuria: Secondary | ICD-10-CM

## 2019-12-05 DIAGNOSIS — R310 Gross hematuria: Secondary | ICD-10-CM

## 2019-12-05 LAB — POCT URINALYSIS DIPSTICK
Bilirubin, UA: NEGATIVE
Blood, UA: NEGATIVE
Glucose, UA: NEGATIVE
Ketones, UA: NEGATIVE
Nitrite, UA: NEGATIVE
Protein, UA: NEGATIVE
Spec Grav, UA: 1.015 (ref 1.010–1.025)
Urobilinogen, UA: 1 E.U./dL
pH, UA: 6 (ref 5.0–8.0)

## 2019-12-05 MED ORDER — PNEUMOCOCCAL 13-VAL CONJ VACC IM SUSP: 0.5000 mL | INTRAMUSCULAR | 0 refills | Status: AC

## 2019-12-05 NOTE — Patient Instructions (Signed)
Urinary Tract Infection, Adult A urinary tract infection (UTI) is an infection of any part of the urinary tract. The urinary tract includes:  The kidneys.  The ureters.  The bladder.  The urethra. These organs make, store, and get rid of pee (urine) in the body. What are the causes? This is caused by germs (bacteria) in your genital area. These germs grow and cause swelling (inflammation) of your urinary tract. What increases the risk? You are more likely to develop this condition if:  You have a small, thin tube (catheter) to drain pee.  You cannot control when you pee or poop (incontinence).  You are female, and: ? You use these methods to prevent pregnancy:  A medicine that kills sperm (spermicide).  A device that blocks sperm (diaphragm). ? You have low levels of a female hormone (estrogen). ? You are pregnant.  You have genes that add to your risk.  You are sexually active.  You take antibiotic medicines.  You have trouble peeing because of: ? A prostate that is bigger than normal, if you are female. ? A blockage in the part of your body that drains pee from the bladder (urethra). ? A kidney stone. ? A nerve condition that affects your bladder (neurogenic bladder). ? Not getting enough to drink. ? Not peeing often enough.  You have other conditions, such as: ? Diabetes. ? A weak disease-fighting system (immune system). ? Sickle cell disease. ? Gout. ? Injury of the spine. What are the signs or symptoms? Symptoms of this condition include:  Needing to pee right away (urgently).  Peeing often.  Peeing small amounts often.  Pain or burning when peeing.  Blood in the pee.  Pee that smells bad or not like normal.  Trouble peeing.  Pee that is cloudy.  Fluid coming from the vagina, if you are female.  Pain in the belly or lower back. Other symptoms include:  Throwing up (vomiting).  No urge to eat.  Feeling mixed up (confused).  Being tired  and grouchy (irritable).  A fever.  Watery poop (diarrhea). How is this treated? This condition may be treated with:  Antibiotic medicine.  Other medicines.  Drinking enough water. Follow these instructions at home:  Medicines  Take over-the-counter and prescription medicines only as told by your doctor.  If you were prescribed an antibiotic medicine, take it as told by your doctor. Do not stop taking it even if you start to feel better. General instructions  Make sure you: ? Pee until your bladder is empty. ? Do not hold pee for a long time. ? Empty your bladder after sex. ? Wipe from front to back after pooping if you are a female. Use each tissue one time when you wipe.  Drink enough fluid to keep your pee pale yellow.  Keep all follow-up visits as told by your doctor. This is important. Contact a doctor if:  You do not get better after 1-2 days.  Your symptoms go away and then come back. Get help right away if:  You have very bad back pain.  You have very bad pain in your lower belly.  You have a fever.  You are sick to your stomach (nauseous).  You are throwing up. Summary  A urinary tract infection (UTI) is an infection of any part of the urinary tract.  This condition is caused by germs in your genital area.  There are many risk factors for a UTI. These include having a small, thin   tube to drain pee and not being able to control when you pee or poop.  Treatment includes antibiotic medicines for germs.  Drink enough fluid to keep your pee pale yellow. This information is not intended to replace advice given to you by your health care provider. Make sure you discuss any questions you have with your health care provider. Document Revised: 04/08/2018 Document Reviewed: 10/29/2017 Elsevier Patient Education  2020 Elsevier Inc.  

## 2019-12-05 NOTE — Chronic Care Management (AMB) (Deleted)
Chronic Care Management Pharmacy  Name: Yvonne Miller  MRN: [id number] DOB: [birth year]  Chief Complaint/ HPI  Yvonne Miller,  69 y.o. , female presents for their Initial CCM visit with the clinical pharmacist via telephone due to COVID-19 Pandemic.  PCP : [name], MD  Their chronic conditions include: Hypertension, Hyperlipidemia, Diabetes, Heart Failure and Chronic Kidney Disease  Office Visits: 10/13/19 Mychart message: Notified pt adrenal CT norma  10/05/19 OV: Annual exam. Diabetic foot exam performed. Kidney function has decreased. Advised pt to stay hydrated. HgbA1c decreased to 7.4%. Pt complained of dysuria. Urine culture positive for UTI. Start Macrobid 100mg  twice daily for 5 days. May take Azo OTC for pain with urination. Right ear flushed by irrigation for impacted cerumen. Repeat CT w/ adrenal protocol ordered due to adrenal nodule found on CT.   06/16/19 AWV and OV: Kidney function stable. Hgb A1c up to 7.7% from 7.0%. HDL low. Pt gained 13 lbs. Recommend pt exercise and eat dinner earlier. Avoid adding salt to food.   Consult Visits: 10/30/19 ICD monitoring: No HVR episodes. Health trends (pt activity, heart rate variability, average heart rates) stable. Very stable ICD function.   09/24/19 ED visit: Presented for evaluation after a fall. Pt complaining of pain in right wrist and some soreness in left hip (minimal). Xray of wrist showed no evidence of acute traumatic injury. Repeat Xray in 7-10 days. Placed in thumb spica. Advised pt to follow up with orthopedist.   08/12/19 Cardiology OV w/ Dr. 10/12/19: Pt denies dyspnea, mild leg edema present. No clinical evidence of heart failure. BP is well controlled. Normal ICD function. No changes to medications.   07/27/19 ICD monitoring: No HVR episodes. Health trends (pt activity, heart rate variability, average heart rates) stable. Normal function.  07/13/19 Mammogram: Calcification found in right breast. Most likely benign.  Follow up in 6 months.  06/28/19 Optometry OV w/ P. Dunn: Primary open-angle glaucoma, bilateral, mild stage. Note unavailable  CCM Encounters: 08/08/19 RN: Care plan established. Patient education provided regarding various disease states.   Medications: Outpatient Encounter Medications as of 12/05/2019  Medication Sig  . ACCU-CHEK AVIVA PLUS test strip CHECK 4 TIMES BY INTRADERMAL ROUTE EVERY DAY  . albuterol (PROVENTIL HFA;VENTOLIN HFA) 108 (90 Base) MCG/ACT inhaler Inhale 2 puffs into the lungs every 4 (four) hours as needed for wheezing or shortness of breath.   02/04/2020 amLODipine (NORVASC) 5 MG tablet TAKE 1 TABLET BY MOUTH  DAILY (Patient taking differently: Take 5 mg by mouth daily. )  . aspirin EC 81 MG tablet Take 81 mg by mouth at bedtime.   . B Complex Vitamins (B COMPLEX-B12) TABS Take 1 tablet by mouth daily.  . carvedilol (COREG) 25 MG tablet TAKE 1 AND 1/2 TABLETS BY  MOUTH TWICE DAILY  . Cholecalciferol (VITAMIN D) 50 MCG (2000 UT) tablet Take 2,000 Units by mouth daily.  . Chromium 1000 MCG TABS Take 1,000 mcg by mouth daily.   . Cinnamon 500 MG capsule Take 500 mg by mouth daily.  . fluticasone (FLONASE) 50 MCG/ACT nasal spray Place 1 spray into both nostrils daily. (Patient taking differently: Place 1 spray into both nostrils as needed for allergies. )  . hydrALAZINE (APRESOLINE) 50 MG tablet TAKE 1 TABLET BY MOUTH  TWICE DAILY  . insulin lispro protamine-lispro (HUMALOG 75/25 MIX) (75-25) 100 UNIT/ML SUSP injection Inject 30-40 Units into the skin daily before supper.  . latanoprost (XALATAN) 0.005 % ophthalmic solution Place 1 drop into both  eyes at bedtime.   [name] nystatin (NYSTATIN) powder Apply topically 3 (three) times daily as needed. As needed (Patient taking differently: Apply 1 g topically 3 (three) times daily as needed (yeast). )  . Omega-3 Fatty Acids (FISH OIL) 500 MG CAPS Take 500 mg by mouth daily.  [name] omeprazole (PRILOSEC) 40 MG capsule Take 40 mg by mouth daily.  .  pravastatin (PRAVACHOL) 40 MG tablet TAKE 1 TABLET BY MOUTH  EVERY DAY (Patient taking differently: Take 40 mg by mouth at bedtime. )  . Probiotic Product (PROBIOTIC PO) Take by mouth.  . spironolactone (ALDACTONE) 25 MG tablet TAKE 1 TABLET BY MOUTH IN  THE MORNING  . spironolactone (ALDACTONE) 50 MG tablet Take 50 mg by mouth daily.  . TRULICITY 1.5 WY/6.3ZC SOPN INJECT 1.5 MG INTO THE SKIN ONCE A WEEK. (Patient taking differently: Inject 1.5 mg as directed every Wednesday. )  . valsartan-hydrochlorothiazide (DIOVAN-HCT) 160-25 MG tablet TAKE 1 TABLET BY MOUTH  DAILY   No facility-administered encounter medications on file as of 12/05/2019.    Current Diagnosis/Assessment:    Goals Addressed   None     Diabetes   A1c goal <7%  Recent Relevant Labs: Lab Results  Component Value Date/Time   HGBA1C 7.4 (H) 10/05/2019 10:14 AM   HGBA1C 7.7 (H) 06/16/2019 09:23 AM   MICROALBUR 30 10/05/2019 12:07 PM   MICROALBUR 10 09/30/2018 03:26 PM    Kidney Function Lab Results  Component Value Date/Time   CREATININE 1.94 (H) 10/05/2019 10:14 AM   CREATININE 1.70 (H) 08/23/2019 08:44 AM   GFRNONAA 26 (L) 10/05/2019 10:14 AM   GFRAA 30 (L) 10/05/2019 10:14 AM   K 5.2 10/05/2019 10:14 AM   K 4.8 06/16/2019 09:23 AM    Last diabetic Eye exam:  Lab Results  Component Value Date/Time   HMDIABEYEEXA No Retinopathy 12/02/2018 12:00 AM    Last diabetic Foot exam: 10/05/19   Checking BG: {CHL HP Blood Glucose Monitoring Frequency:[phone]}  Recent FBG Readings: *** Recent pre-meal BG readings:  Recent 2hr PP BG readings:   Recent HS BG readings:   Patient has failed these meds in past: Levemir, Metformin XR, Onglyza, Januvia, Novolin 70/30 Patient is currently uncontrolled on the following medications: . Trulicity 1.'5mg'$  weekly . Humalog mix 75/25 30-40 units daily before supper . Aspirin '81mg'$  daily  We discussed:  .   Plan Continue {CHL HP Upstream Pharmacy [phone]}    Hyperlipidemia   LDL goal < 70  Lipid Panel     Component Value Date/Time   CHOL 135 06/16/2019 0923   TRIG 145 06/16/2019 0923   HDL 37 (L) 06/16/2019 0923   LDLCALC 73 06/16/2019 0923    Hepatic Function Latest Ref Rng & Units 10/05/2019 06/16/2019 09/30/2018  Total Protein 6.0 - 8.5 g/dL 7.4 7.0 7.4  Albumin 3.8 - 4.8 g/dL 4.3 4.1 4.6  AST 0 - 40 IU/L '15 14 12  '$ ALT 0 - 32 IU/L '13 13 16  '$ Alk Phosphatase 48 - 121 IU/L 98 107 99  Total Bilirubin 0.0 - 1.2 mg/dL <0.2 <0.2 0.2     The 10-year ASCVD risk score Mikey Bussing DC Jr., et al., 2013) is: 14.5%   Values used to calculate the score:     Age: 56 years     Sex: Female     Is Non-Hispanic African American: Yes     Diabetic: Yes     Tobacco smoker: No     Systolic Blood Pressure: 588 mmHg  Is BP treated: Yes     HDL Cholesterol: 37 mg/dL     Total Cholesterol: 135 mg/dL   Patient has failed these meds in past: Rosuvastatin Patient is currently {CHL Controlled/Uncontrolled:[phone]} on the following medications:  . Pravastatin '40mg'$  daily . Omega 3 fatty acids '500mg'$  daily  We discussed:   .   Plan Continue {CHL HP Upstream Pharmacy Plans:[phone]}  Consider changing statin medication due to decreased kidney function (per manufacturer for mild-moderate impairment use with caution, for severe impairment '10mg'$  daily maximum for pravastatin)  Hypertension   BP goal is:  <130/80  Office blood pressures are  BP Readings from Last 3 Encounters:  10/05/19 112/76  09/24/19 129/77  09/01/19 128/76   Patient checks BP at home {CHL HP BP Monitoring Frequency:[phone]} Patient home BP readings are ranging: ***  Patient has failed these meds in the past: Olmesartan/HCTZ Patient is currently {CHL Controlled/Uncontrolled:[phone]} on the following medications:  . Amlodipine '5mg'$  daily . Valsartan/HCTZ 160/'25mg'$  daily . Hydralazine '50mg'$  twice daily  We discussed: .   Plan Continue {CHL HP Upstream Pharmacy  Plans:[phone]}   Heart Failure   Type: Combined Systolic and Diastolic  Last ejection fraction: 55% 07/06/14 NYHA Class: I (no actitivty limitation)  BP goal is:  <130/80 BP Readings from Last 3 Encounters:  10/05/19 112/76  09/24/19 129/77  09/01/19 128/76   Kidney Function Lab Results  Component Value Date/Time   CREATININE 1.94 (H) 10/05/2019 10:14 AM   CREATININE 1.70 (H) 08/23/2019 08:44 AM   GFRNONAA 26 (L) 10/05/2019 10:14 AM   GFRAA 30 (L) 10/05/2019 10:14 AM   K 5.2 10/05/2019 10:14 AM   K 4.8 06/16/2019 09:23 AM   Patient has failed these meds in past: Furosemide Patient is currently {CHL Controlled/Uncontrolled:[phone]} on the following medications:  . Spironolactone '50mg'$  daily, '25mg'$  daily *** . Carvedilol '25mg'$  1.5 tablets twice daily  We discussed {CHL HP Upstream Pharmacy discussion:[id number]}  Plan  Continue {CHL HP Upstream Pharmacy Plans:[phone]}    Osteopenia / Osteoporosis   Last DEXA Scan: ***   T-Score femoral neck: ***  T-Score total hip: ***  T-Score lumbar spine: ***  T-Score forearm radius: ***  10-year probability of major osteoporotic fracture: ***  10-year probability of hip fracture: ***  No results found for: VD25OH   Patient {is;is not an osteoporosis candidate:23886}  Patient has failed these meds in past: *** Patient is currently {CHL Controlled/Uncontrolled:[phone]} on the following medications:  . Cholecalciferol 2000 units daily  We discussed:  {Osteoporosis Counseling:23892}  Plan Continue {CHL HP Upstream Pharmacy Plans:[phone]}  Health Maintenance   Patient is currently on the following medications:  . B complex vitamin . Chromium 1023mg daily . Cinnamon '500mg'$  daily . Probiotic daily . Fluticasone nasal spray . Latanoprost . Nystatin powder . Omeprazole '40mg'$  daily  We discussed:   .   Plan Continue {CHL HP Upstream Pharmacy PZOXWR:[id number]}  Vaccines   Reviewed and discussed  patient's vaccination history.    Immunization History  Administered Date(s) Administered  . Hepatitis A 01/19/2012, 02/16/2012  . Hepatitis B 01/19/2012, 02/16/2012  . IPV 02/16/2012  . Influenza, High Dose Seasonal PF 02/17/2016, 03/04/2018, 02/10/2019  . Influenza-Unspecified 02/02/2014, 02/16/2014  . MMR 04/04/1994  . PFIZER SARS-COV-2 Vaccination 06/11/2019, 07/02/2019  . Td 09/08/2006  . Tdap 01/19/2012  . Zoster 12/15/2007    Plan Recommended patient receive *** vaccine in *** office/pharmacy.   Medication Management   Pt [facility] all medications Uses  pill box? {Yes or If no, why not?:20788} Pt endorses ***% compliance  We discussed: *** . Importance of taking each medications daily as directed  Plan {US Pharmacy LTYV:57322}    Follow up: *** month phone visit  Jannette Fogo, PharmD Clinical Pharmacist [facility] [phone]

## 2019-12-05 NOTE — Progress Notes (Signed)
I,Katawbba Wiggins,acting as a Education administrator for Maximino Greenland, MD.,have documented all relevant documentation on the behalf of Maximino Greenland, MD,as directed by  Maximino Greenland, MD while in the presence of Maximino Greenland, MD.  This visit occurred during the SARS-CoV-2 public health emergency.  Safety protocols were in place, including screening questions prior to the visit, additional usage of staff PPE, and extensive cleaning of exam room while observing appropriate contact time as indicated for disinfecting solutions.  Subjective:     Patient ID: Yvonne Miller , female    DOB: 1951-04-26 , 69 y.o.   MRN: [id number]   Chief Complaint  Patient presents with  . Hematuria    HPI  The patient is here for evaluation of blood in her urine. She reports she first noticed blood in her urine about two weeks ago. She decided to drink cranberry juice and Azo, with resolution of her sx. She did have "pressure" in her bladder, also had dysuria.  She reports her sx recurred two days ago, but have since resolved.   Hematuria This is a new problem. The current episode started 1 to 4 weeks ago. The problem has been resolved since onset. She describes the hematuria as gross hematuria. Irritative symptoms include frequency and urgency.     Past Medical History:  Diagnosis Date  . AICD (automatic cardioverter/defibrillator) present   . Arthritis   . Asthma   . Carpal tunnel syndrome, bilateral   . CKD (chronic kidney disease)   . Diabetes mellitus   . Encounter for assessment of implantable cardioverter-defibrillator (ICD)   . GERD (gastroesophageal reflux disease)   . Hyperlipemia   . Hypertension   . Obesity    morbid  . Sleep apnea    wears CPAP set at 12  . Wears glasses      Family History  Problem Relation Age of Onset  . Hypertension Mother   . Stroke Mother   . Heart disease Father   . Heart attack Sister   . Heart attack Brother      Current Outpatient Medications:  .   ACCU-CHEK AVIVA PLUS test strip, CHECK 4 TIMES BY INTRADERMAL ROUTE EVERY DAY, Disp: 200 strip, Rfl: 11 .  albuterol (PROVENTIL HFA;VENTOLIN HFA) 108 (90 Base) MCG/ACT inhaler, Inhale 2 puffs into the lungs every 4 (four) hours as needed for wheezing or shortness of breath. , Disp: , Rfl:  .  amLODipine (NORVASC) 5 MG tablet, TAKE 1 TABLET BY MOUTH  DAILY (Patient taking differently: Take 5 mg by mouth daily. ), Disp: 90 tablet, Rfl: 3 .  amLODIPine-Olmesartan (AZOR PO), Take by mouth., Disp: , Rfl:  .  aspirin EC 81 MG tablet, Take 81 mg by mouth at bedtime. , Disp: , Rfl:  .  B Complex Vitamins (B COMPLEX-B12) TABS, Take 1 tablet by mouth daily., Disp: , Rfl:  .  carvedilol (COREG) 25 MG tablet, TAKE 1 AND 1/2 TABLETS BY  MOUTH TWICE DAILY, Disp: 270 tablet, Rfl: 3 .  Cholecalciferol (VITAMIN D) 50 MCG (2000 UT) tablet, Take 2,000 Units by mouth daily., Disp: , Rfl:  .  Chromium 1000 MCG TABS, Take 1,000 mcg by mouth daily. , Disp: , Rfl:  .  Cinnamon 500 MG capsule, Take 500 mg by mouth daily., Disp: , Rfl:  .  hydrALAZINE (APRESOLINE) 50 MG tablet, TAKE 1 TABLET BY MOUTH  TWICE DAILY, Disp: 180 tablet, Rfl: 3 .  insulin lispro protamine-lispro (HUMALOG 75/25 MIX) (75-25) 100 UNIT/ML  SUSP injection, Inject 30-40 Units into the skin daily before supper., Disp: 10 mL, Rfl: 3 .  latanoprost (XALATAN) 0.005 % ophthalmic solution, Place 1 drop into both eyes at bedtime. , Disp: , Rfl:  .  nystatin (NYSTATIN) powder, Apply topically 3 (three) times daily as needed. As needed (Patient taking differently: Apply 1 g topically 3 (three) times daily as needed (yeast). ), Disp: 60 g, Rfl: 2 .  Omega-3 Fatty Acids (FISH OIL) 500 MG CAPS, Take 500 mg by mouth daily., Disp: , Rfl:  .  omeprazole (PRILOSEC) 40 MG capsule, Take 40 mg by mouth daily., Disp: , Rfl:  .  pravastatin (PRAVACHOL) 40 MG tablet, TAKE 1 TABLET BY MOUTH  EVERY DAY (Patient taking differently: Take 40 mg by mouth at bedtime. ), Disp: 90  tablet, Rfl: 3 .  Probiotic Product (PROBIOTIC PO), Take by mouth., Disp: , Rfl:  .  spironolactone (ALDACTONE) 25 MG tablet, TAKE 1 TABLET BY MOUTH IN  THE MORNING, Disp: 90 tablet, Rfl: 3 .  spironolactone (ALDACTONE) 50 MG tablet, Take 50 mg by mouth daily., Disp: , Rfl:  .  TRULICITY 1.5 VQ/2.5ZD SOPN, INJECT 1.5 MG INTO THE SKIN ONCE A WEEK. (Patient taking differently: Inject 1.5 mg as directed every Wednesday. ), Disp: 2 pen, Rfl: 2 .  valsartan-hydrochlorothiazide (DIOVAN-HCT) 160-25 MG tablet, TAKE 1 TABLET BY MOUTH  DAILY, Disp: 90 tablet, Rfl: 3 .  fluticasone (FLONASE) 50 MCG/ACT nasal spray, Place 1 spray into both nostrils daily. (Patient taking differently: Place 1 spray into both nostrils as needed for allergies. ), Disp: 16 g, Rfl: 2 .  nitrofurantoin, macrocrystal-monohydrate, (MACROBID) 100 MG capsule, Take 1 capsule (100 mg total) by mouth 2 (two) times daily for 7 days., Disp: 14 capsule, Rfl: 0   Allergies  Allergen Reactions  . Other Other (See Comments)    NO BLOOD   . Jehovah witness     Review of Systems  Constitutional: Negative.   Respiratory: Negative.   Cardiovascular: Negative.   Gastrointestinal: Negative.   Genitourinary: Positive for frequency, hematuria and urgency.       Symptoms started a week ago.  Psychiatric/Behavioral: Negative.   All other systems reviewed and are negative.    Today's Vitals   12/05/19 1410  BP: 116/74  Pulse: (!) 56  Temp: 98.1 F (36.7 C)  TempSrc: Oral  Weight: 263 lb 12.8 oz (119.7 kg)  Height: 5' 2.6" (1.59 m)  PainSc: 0-No pain   Body mass index is 47.33 kg/m.   Objective:  Physical Exam Vitals and nursing note reviewed.  Constitutional:      Appearance: Normal appearance. She is obese.  HENT:     Head: Normocephalic and atraumatic.  Cardiovascular:     Rate and Rhythm: Normal rate and regular rhythm.     Heart sounds: Normal heart sounds.  Pulmonary:     Breath sounds: Normal breath sounds.  Skin:     General: Skin is warm.  Neurological:     General: No focal deficit present.     Mental Status: She is alert and oriented to person, place, and time.         Assessment And Plan:     1. Dysuria Comments: I will check urinalysis. It appears the hematuria has resolved. If needed, I will send urine culture. She is encouraged to stay well hydrated.  - POCT Urinalysis Dipstick (81002)  2. Immunization due   Immunization history was reviewed in full detail.  Rx for  Prevnar-13 was sent to her local pharmacy. She is encouraged to get in the next 1-2weeks.    Patient was given opportunity to ask questions. Patient verbalized understanding of the plan and was able to repeat key elements of the plan. All questions were answered to their satisfaction.  Maximino Greenland, MD   I, Maximino Greenland, MD, have reviewed all documentation for this visit. The documentation on 12/11/19 for the exam, diagnosis, procedures, and orders are all accurate and complete.  THE PATIENT IS ENCOURAGED TO PRACTICE SOCIAL DISTANCING DUE TO THE COVID-19 PANDEMIC.

## 2019-12-06 ENCOUNTER — Other Ambulatory Visit: Payer: Self-pay

## 2019-12-06 DIAGNOSIS — R8290 Unspecified abnormal findings in urine: Secondary | ICD-10-CM

## 2019-12-06 DIAGNOSIS — H401131 Primary open-angle glaucoma, bilateral, mild stage: Secondary | ICD-10-CM | POA: Diagnosis not present

## 2019-12-08 LAB — URINE CULTURE

## 2019-12-09 ENCOUNTER — Other Ambulatory Visit: Payer: Self-pay | Admitting: Internal Medicine

## 2019-12-09 MED ORDER — NITROFURANTOIN MONOHYD MACRO 100 MG PO CAPS
100.0000 mg | ORAL_CAPSULE | Freq: Two times a day (BID) | ORAL | 0 refills | Status: AC
Start: 2019-12-09 — End: 2019-12-16

## 2019-12-12 ENCOUNTER — Other Ambulatory Visit: Payer: Self-pay

## 2019-12-13 ENCOUNTER — Other Ambulatory Visit: Payer: Self-pay | Admitting: Internal Medicine

## 2019-12-13 ENCOUNTER — Other Ambulatory Visit: Payer: Self-pay | Admitting: Cardiology

## 2019-12-22 DIAGNOSIS — R928 Other abnormal and inconclusive findings on diagnostic imaging of breast: Secondary | ICD-10-CM | POA: Diagnosis not present

## 2019-12-22 LAB — HM MAMMOGRAPHY: HM Mammogram: ABNORMAL — AB (ref 0–4)

## 2019-12-23 ENCOUNTER — Encounter: Payer: Self-pay | Admitting: Internal Medicine

## 2019-12-26 ENCOUNTER — Telehealth: Payer: Self-pay

## 2019-12-26 NOTE — Chronic Care Management (AMB) (Signed)
Chronic Care Management Pharmacy Assistant   Name: Yvonne Miller  MRN: [id number] DOB: May 14, 1950  Reason for Encounter: Medication Review/ Patient Assistance Coordination  PCP : [name], MD  Allergies:   Allergies  Allergen Reactions  . Other Other (See Comments)    NO BLOOD   . Jehovah witness    Medications: Outpatient Encounter Medications as of 12/26/2019  Medication Sig  . ACCU-CHEK AVIVA PLUS test strip CHECK 4 TIMES BY INTRADERMAL ROUTE EVERY DAY  . albuterol (PROVENTIL HFA;VENTOLIN HFA) 108 (90 Base) MCG/ACT inhaler Inhale 2 puffs into the lungs every 4 (four) hours as needed for wheezing or shortness of breath.   [name] amLODipine (NORVASC) 5 MG tablet TAKE 1 TABLET BY MOUTH  DAILY  . amLODIPine-Olmesartan (AZOR PO) Take by mouth.  [name] aspirin EC 81 MG tablet Take 81 mg by mouth at bedtime.   . B Complex Vitamins (B COMPLEX-B12) TABS Take 1 tablet by mouth daily.  . carvedilol (COREG) 25 MG tablet TAKE 1 AND 1/2 TABLETS BY  MOUTH TWICE DAILY  . Cholecalciferol (VITAMIN D) 50 MCG (2000 UT) tablet Take 2,000 Units by mouth daily.  . Chromium 1000 MCG TABS Take 1,000 mcg by mouth daily.   . Cinnamon 500 MG capsule Take 500 mg by mouth daily.  . fluticasone (FLONASE) 50 MCG/ACT nasal spray Place 1 spray into both nostrils daily. (Patient taking differently: Place 1 spray into both nostrils as needed for allergies. )  . hydrALAZINE (APRESOLINE) 50 MG tablet TAKE 1 TABLET BY MOUTH  TWICE DAILY  . insulin lispro protamine-lispro (HUMALOG 75/25 MIX) (75-25) 100 UNIT/ML SUSP injection Inject 30-40 Units into the skin daily before supper.  . latanoprost (XALATAN) 0.005 % ophthalmic solution Place 1 drop into both eyes at bedtime.   [name] nystatin (NYSTATIN) powder Apply topically 3 (three) times daily as needed. As needed (Patient taking differently: Apply 1 g topically 3 (three) times daily as needed (yeast). )  . Omega-3 Fatty Acids (FISH OIL) 500 MG CAPS Take 500 mg by mouth  daily.  [name] omeprazole (PRILOSEC) 40 MG capsule Take 40 mg by mouth daily.  . pravastatin (PRAVACHOL) 40 MG tablet TAKE 1 TABLET BY MOUTH  DAILY  . Probiotic Product (PROBIOTIC PO) Take by mouth.  . spironolactone (ALDACTONE) 25 MG tablet TAKE 1 TABLET BY MOUTH IN  THE MORNING  . spironolactone (ALDACTONE) 50 MG tablet Take 50 mg by mouth daily.  . TRULICITY 1.5 TZ/0.0FV SOPN INJECT 1.5 MG INTO THE SKIN ONCE A WEEK. (Patient taking differently: Inject 1.5 mg as directed every Wednesday. )  . valsartan-hydrochlorothiazide (DIOVAN-HCT) 160-25 MG tablet TAKE 1 TABLET BY MOUTH  DAILY   No facility-administered encounter medications on file as of 12/26/2019.    Current Diagnosis: Patient Active Problem List   Diagnosis Date Noted  . Non-ischemic cardiomyopathy ([place]) 02/18/2019  . Encounter for assessment of implantable cardioverter-defibrillator (ICD)   . Type 2 diabetes mellitus with stage 3 chronic kidney disease, with long-term current use of insulin ([place]) 03/04/2018  . 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  . Chronic combined systolic and diastolic congestive heart failure ([place]) 03/04/2018  . Chronic renal disease, stage II 03/04/2018  . Status post revision of total replacement of left knee 01/26/2018  . Unilateral primary osteoarthritis, left knee 08/18/2017  . Biventricular automatic implantable cardioverter defibrillator in situ 12/13/2013  . ICD -  Biventricular  [employer] Dynagen X4 CRT-D Model G158 ICD in place 10/28/13 10/28/2013  . HTN (  hypertension) 10/03/2013  . Hyperlipemia 10/03/2013  . LBBB (left bundle branch block) 10/03/2013  . Cardiomyopathy ([place]) 10/03/2013  . DM (diabetes mellitus) ([place]) 10/03/2013     Follow-Up:  Patient Assistance Coordination- Patient reached out stating she has not received her Humalog 75/25 mix pen from [facility], she called them and they stated they never received the prescription. Called [facility], spoke with Davy Pique, I was  given another fax # [phone] and also transferred to [facility] pharmacist to verify prescription. Prescription faxed, patient will be getting a 4 month supply with a year refill sent to her home. Phone number to the insulin department at [facility] is [phone]. Called patient to inform on that prescription was processed, no ETA given. Patient did not answer, left message to return call and/or follow up with [facility] to check the ETA of medication delivery.  Patient returned my call, she is aware prescription was sent and verified to be delivered, patient was very appreciative that we could handle this so quickly. Jannette Fogo, CPP aware. [name], [place] Pharmacist Assistant [phone]

## 2019-12-30 ENCOUNTER — Telehealth: Payer: Medicare Other

## 2019-12-30 ENCOUNTER — Telehealth: Payer: Self-pay

## 2019-12-30 NOTE — Chronic Care Management (AMB) (Deleted)
Chronic Care Management Pharmacy  Name: Yvonne Miller  MRN: [id number] DOB: March 07, 1951  Chief Complaint/ HPI  Yvonne Miller,  69 y.o. , female presents for their Initial CCM visit with the clinical pharmacist via telephone due to COVID-19 Pandemic.  PCP : Yvonne Chard, MD  Their chronic conditions include: Hypertension, Hyperlipidemia, Diabetes, Heart Failure and Chronic Kidney Disease  Office Visits: 12/03/19 Mychart message:   10/13/19 Mychart message: Notified pt adrenal CT normal  10/05/19 OV: Annual exam. Diabetic foot exam performed. Kidney function has decreased. Advised pt to stay hydrated. HgbA1c decreased to 7.4%. Pt complained of dysuria. Urine culture positive for UTI. Start Macrobid 150m twice daily for 5 days. May take Azo OTC for pain with urination. Right ear flushed by irrigation for impacted cerumen. Repeat CT w/ adrenal protocol ordered due to adrenal nodule found on CT.   06/16/19 AWV and OV: Kidney function stable. Hgb A1c up to 7.7% from 7.0%. HDL low. Pt gained 13 lbs. Recommend pt exercise and eat dinner earlier. Avoid adding salt to food.   Consult Visits: 10/30/19 ICD monitoring: No HVR episodes. Health trends (pt activity, heart rate variability, average heart rates) stable. Very stable ICD function.   09/24/19 ED visit: Presented for evaluation after a fall. Pt complaining of pain in right wrist and some soreness in left hip (minimal). Xray of wrist showed no evidence of acute traumatic injury. Repeat Xray in 7-10 days. Placed in thumb spica. Advised pt to follow up with orthopedist.   08/12/19 Cardiology OV w/ Dr. GEinar Miller Pt denies dyspnea, mild leg edema present. No clinical evidence of heart failure. BP is well controlled. Normal ICD function. No changes to medications.   07/27/19 ICD monitoring: No HVR episodes. Health trends (pt activity, heart rate variability, average heart rates) stable. Normal function.  07/13/19 Mammogram: Calcification found in right  breast. Most likely benign. Follow up in 6 months.  06/28/19 Optometry OV w/ Yvonne Miller: Primary open-angle glaucoma, bilateral, mild stage. Note unavailable  CCM Encounters: 08/08/19 RN: Care plan established. Patient education provided regarding various disease states.   Medications: Outpatient Encounter Medications as of 12/30/2019  Medication Sig  . ACCU-CHEK AVIVA PLUS test strip CHECK 4 TIMES BY INTRADERMAL ROUTE EVERY DAY  . albuterol (PROVENTIL HFA;VENTOLIN HFA) 108 (90 Base) MCG/ACT inhaler Inhale 2 puffs into the lungs every 4 (four) hours as needed for wheezing or shortness of breath.   .Marland KitchenamLODipine (NORVASC) 5 MG tablet TAKE 1 TABLET BY MOUTH  DAILY  . amLODIPine-Olmesartan (AZOR PO) Take by mouth.  .Marland Kitchenaspirin EC 81 MG tablet Take 81 mg by mouth at bedtime.   . B Complex Vitamins (B COMPLEX-B12) TABS Take 1 tablet by mouth daily.  . carvedilol (COREG) 25 MG tablet TAKE 1 AND 1/2 TABLETS BY  MOUTH TWICE DAILY  . Cholecalciferol (VITAMIN D) 50 MCG (2000 UT) tablet Take 2,000 Units by mouth daily.  . Chromium 1000 MCG TABS Take 1,000 mcg by mouth daily.   . Cinnamon 500 MG capsule Take 500 mg by mouth daily.  . fluticasone (FLONASE) 50 MCG/ACT nasal spray Place 1 spray into both nostrils daily. (Patient taking differently: Place 1 spray into both nostrils as needed for allergies. )  . hydrALAZINE (APRESOLINE) 50 MG tablet TAKE 1 TABLET BY MOUTH  TWICE DAILY  . insulin lispro protamine-lispro (HUMALOG 75/25 MIX) (75-25) 100 UNIT/ML SUSP injection Inject 30-40 Units into the skin daily before supper.  . latanoprost (XALATAN) 0.005 % ophthalmic solution Place 1  drop into both eyes at bedtime.   [name] nystatin (NYSTATIN) powder Apply topically 3 (three) times daily as needed. As needed (Patient taking differently: Apply 1 g topically 3 (three) times daily as needed (yeast). )  . Omega-3 Fatty Acids (FISH OIL) 500 MG CAPS Take 500 mg by mouth daily.  [name] omeprazole (PRILOSEC) 40 MG capsule Take 40 mg  by mouth daily.  . pravastatin (PRAVACHOL) 40 MG tablet TAKE 1 TABLET BY MOUTH  DAILY  . Probiotic Product (PROBIOTIC PO) Take by mouth.  . spironolactone (ALDACTONE) 25 MG tablet TAKE 1 TABLET BY MOUTH IN  THE MORNING  . spironolactone (ALDACTONE) 50 MG tablet Take 50 mg by mouth daily.  . TRULICITY 1.5 VO/3.5KK SOPN INJECT 1.5 MG INTO THE SKIN ONCE A WEEK. (Patient taking differently: Inject 1.5 mg as directed every Wednesday. )  . valsartan-hydrochlorothiazide (DIOVAN-HCT) 160-25 MG tablet TAKE 1 TABLET BY MOUTH  DAILY   No facility-administered encounter medications on file as of 12/30/2019.    Current Diagnosis/Assessment:    Goals Addressed   None     Diabetes   A1c goal <7%  Recent Relevant Labs: Lab Results  Component Value Date/Time   HGBA1C 7.4 (H) 10/05/2019 10:14 AM   HGBA1C 7.7 (H) 06/16/2019 09:23 AM   MICROALBUR 30 10/05/2019 12:07 PM   MICROALBUR 10 09/30/2018 03:26 PM    Kidney Function Lab Results  Component Value Date/Time   CREATININE 1.94 (H) 10/05/2019 10:14 AM   CREATININE 1.70 (H) 08/23/2019 08:44 AM   GFRNONAA 26 (L) 10/05/2019 10:14 AM   GFRAA 30 (L) 10/05/2019 10:14 AM   K 5.2 10/05/2019 10:14 AM   K 4.8 06/16/2019 09:23 AM   Last diabetic Eye exam:  Lab Results  Component Value Date/Time   HMDIABEYEEXA No Retinopathy 12/02/2018 12:00 AM    Last diabetic Foot exam: 10/05/19   Checking BG: {CHL HP Blood Glucose Monitoring Frequency:[id number]}  Recent FBG Readings: *** Recent pre-meal BG readings:  Recent 2hr PP BG readings:   Recent HS BG readings:   Patient has failed these meds in past: Levemir, Metformin XR, Onglyza, Januvia, Novolin 70/30 Patient is currently uncontrolled on the following medications: . Trulicity 9.3GH weekly . Humalog mix 75/25 30-40 units daily before supper . Aspirin 88m daily  We discussed:  .   Plan Continue {CHL HP Upstream Pharmacy Plans:[phone]}   Hyperlipidemia   LDL goal < 70  Lipid  Panel     Component Value Date/Time   CHOL 135 06/16/2019 0923   TRIG 145 06/16/2019 0923   HDL 37 (L) 06/16/2019 0923   LDLCALC 73 06/16/2019 0923    Hepatic Function Latest Ref Rng & Units 10/05/2019 06/16/2019 09/30/2018  Total Protein 6.0 - 8.5 g/dL 7.4 7.0 7.4  Albumin 3.8 - 4.8 g/dL 4.3 4.1 4.6  AST 0 - 40 IU/L _0 ALT 0 - 32 IU/L _1 Alk Phosphatase 48 - 121 IU/L 98 107 99  Total Bilirubin 0.0 - 1.2 mg/dL <0.2 <0.2 0.2     The 10-year ASCVD risk score (Mikey BussingDC Jr., et al., 2013) is: 15.4%   Values used to calculate the score:     Age: [age]years     Sex: Female     Is Non-Hispanic African American: Yes     Diabetic: Yes     Tobacco smoker: No     Systolic Blood Pressure: 1829mmHg     Is BP treated: Yes  HDL Cholesterol: 37 mg/dL     Total Cholesterol: 135 mg/dL   Patient has failed these meds in past: Rosuvastatin Patient is currently {CHL Controlled/Uncontrolled:[phone]} on the following medications:  . Pravastatin 16m daily . Omega 3 fatty acids 5052mdaily  We discussed:   .   Plan Continue {CHL HP Upstream Pharmacy Plans:[id number]}  Consider changing statin medication due to decreased kidney function (per manufacturer for mild-moderate impairment use with caution, for severe impairment 1022maily maximum for pravastatin)  Hypertension   BP goal is:  <130/80  Office blood pressures are  BP Readings from Last 3 Encounters:  12/05/19 116/74  10/05/19 112/76  09/24/19 129/77   Patient checks BP at home {CHL HP BP Monitoring Frequency:[phone]} Patient home BP readings are ranging: ***  Patient has failed these meds in the past: Olmesartan/HCTZ Patient is currently {CHL Controlled/Uncontrolled:[phone]} on the following medications:  . Amlodipine 5mg66mily . Valsartan/HCTZ 160/25mg5mly . Hydralazine 50mg 55me daily  We discussed: .   Plan Continue {CHL HP Upstream Pharmacy Plans:[id number]}   Heart Failure   Type: Combined  Systolic and Diastolic  Last ejection fraction: 55% 07/06/14 NYHA Class: I (no actitivty limitation)  BP goal is:  <130/80 BP Readings from Last 3 Encounters:  12/05/19 116/74  10/05/19 112/76  09/24/19 129/77   Kidney Function Lab Results  Component Value Date/Time   CREATININE 1.94 (H) 10/05/2019 10:14 AM   CREATININE 1.70 (H) 08/23/2019 08:44 AM   GFRNONAA 26 (L) 10/05/2019 10:14 AM   GFRAA 30 (L) 10/05/2019 10:14 AM   K 5.2 10/05/2019 10:14 AM   K 4.8 06/16/2019 09:23 AM   Patient has failed these meds in past: Furosemide Patient is currently {CHL Controlled/Uncontrolled:[phone]} on the following medications:  . Spironolactone 50mg d26m, 25mg da22m*** . Carvedilol 25mg 1.549mlets twice daily  We discussed {CHL HP Upstream Pharmacy discussion:[phone]}  Plan  Continue {CHL HP Upstream Pharmacy Plans:[id number]}    Osteopenia / Osteoporosis   Last DEXA Scan: ***   T-Score femoral neck: ***  T-Score total hip: ***  T-Score lumbar spine: ***  T-Score forearm radius: ***  10-year probability of major osteoporotic fracture: ***  10-year probability of hip fracture: ***  No results found for: VD25OH   Patient {is;is not an osteoporosis candidate:23886}  Patient has failed these meds in past: *** Patient is currently {CHL Controlled/Uncontrolled:[phone]} on the following medications:  . Cholecalciferol 2000 units daily  We discussed:  {Osteoporosis Counseling:23892}  Plan Continue {CHL HP Upstream Pharmacy Plans:2101040186}  Health Maintenance   Patient is currently on the following medications:  . B complex vitamin . Chromium 1000mcg dai17m Cinnamon 500mg daily3mrobiotic daily . Fluticasone nasal spray . Latanoprost . Nystatin powder . Omeprazole 40mg daily 60mdiscussed:   .   Plan Continue {CHL HP Upstream Pharmacy Plans:[id number]}  Reviewed and discussed patient's vaccination history.    Immunization History    Administered Date(s) Administered  . Hepatitis A 01/19/2012, 02/16/2012  . Hepatitis B 01/19/2012, 02/16/2012  . IPV 02/16/2012  . Influenza, High Dose Seasonal PF 02/17/2016, 03/04/2018, 02/10/2019  . Influenza-Unspecified 02/02/2014, 02/16/2014  . MMR 04/04/1994  . PFIZER SARS-COV-2 Vaccination 06/11/2019, 07/02/2019  . Td 09/08/2006  . Tdap 01/19/2012  . Zoster 12/15/2007    Plan Recommended patient receive *** vaccine in *** office/pharmacy.   Medication Management   Pt [facility] for all medications Uses pill box? {Yes or If no, why  not?:20788} Pt endorses ***% compliance  We discussed: *** . Importance of taking each medications daily as directed  Plan {US Pharmacy SAYT:01601}    Follow up: *** month phone visit  Jannette Fogo, PharmD Clinical Pharmacist [facility] [phone]

## 2020-01-12 ENCOUNTER — Encounter: Payer: Self-pay | Admitting: Internal Medicine

## 2020-01-19 DIAGNOSIS — I5022 Chronic systolic (congestive) heart failure: Secondary | ICD-10-CM | POA: Diagnosis not present

## 2020-01-19 DIAGNOSIS — Z95810 Presence of automatic (implantable) cardiac defibrillator: Secondary | ICD-10-CM | POA: Diagnosis not present

## 2020-01-19 DIAGNOSIS — Z4502 Encounter for adjustment and management of automatic implantable cardiac defibrillator: Secondary | ICD-10-CM | POA: Diagnosis not present

## 2020-02-01 ENCOUNTER — Telehealth: Payer: Self-pay

## 2020-02-07 ENCOUNTER — Ambulatory Visit (INDEPENDENT_AMBULATORY_CARE_PROVIDER_SITE_OTHER): Payer: Medicare Other | Admitting: Internal Medicine

## 2020-02-07 ENCOUNTER — Encounter: Payer: Self-pay | Admitting: Internal Medicine

## 2020-02-07 ENCOUNTER — Other Ambulatory Visit: Payer: Self-pay

## 2020-02-07 VITALS — BP 134/70 | HR 68 | Temp 97.4°F | Ht 63.0 in | Wt 266.0 lb

## 2020-02-07 DIAGNOSIS — Z794 Long term (current) use of insulin: Secondary | ICD-10-CM | POA: Diagnosis not present

## 2020-02-07 DIAGNOSIS — N183 Chronic kidney disease, stage 3 unspecified: Secondary | ICD-10-CM | POA: Diagnosis not present

## 2020-02-07 DIAGNOSIS — Z6841 Body Mass Index (BMI) 40.0 and over, adult: Secondary | ICD-10-CM

## 2020-02-07 DIAGNOSIS — E1122 Type 2 diabetes mellitus with diabetic chronic kidney disease: Secondary | ICD-10-CM | POA: Diagnosis not present

## 2020-02-07 DIAGNOSIS — I130 Hypertensive heart and chronic kidney disease with heart failure and stage 1 through stage 4 chronic kidney disease, or unspecified chronic kidney disease: Secondary | ICD-10-CM | POA: Diagnosis not present

## 2020-02-07 DIAGNOSIS — Z23 Encounter for immunization: Secondary | ICD-10-CM

## 2020-02-07 NOTE — Patient Instructions (Signed)
Diabetes Mellitus and Exercise Exercising regularly is important for your overall health, especially when you have diabetes (diabetes mellitus). Exercising is not only about losing weight. It has many other health benefits, such as increasing muscle strength and bone density and reducing body fat and stress. This leads to improved fitness, flexibility, and endurance, all of which result in better overall health. Exercise has additional benefits for people with diabetes, including:  Reducing appetite.  Helping to lower and control blood glucose.  Lowering blood pressure.  Helping to control amounts of fatty substances (lipids) in the blood, such as cholesterol and triglycerides.  Helping the body to respond better to insulin (improving insulin sensitivity).  Reducing how much insulin the body needs.  Decreasing the risk for heart disease by: ? Lowering cholesterol and triglyceride levels. ? Increasing the levels of good cholesterol. ? Lowering blood glucose levels. What is my activity plan? Your health care provider or certified diabetes educator can help you make a plan for the type and frequency of exercise (activity plan) that works for you. Make sure that you:  Do at least 150 minutes of moderate-intensity or vigorous-intensity exercise each week. This could be brisk walking, biking, or water aerobics. ? Do stretching and strength exercises, such as yoga or weightlifting, at least 2 times a week. ? Spread out your activity over at least 3 days of the week.  Get some form of physical activity every day. ? Do not go more than 2 days in a row without some kind of physical activity. ? Avoid being inactive for more than 30 minutes at a time. Take frequent breaks to walk or stretch.  Choose a type of exercise or activity that you enjoy, and set realistic goals.  Start slowly, and gradually increase the intensity of your exercise over time. What do I need to know about managing my  diabetes?   Check your blood glucose before and after exercising. ? If your blood glucose is 240 mg/dL (13.3 mmol/L) or higher before you exercise, check your urine for ketones. If you have ketones in your urine, do not exercise until your blood glucose returns to normal. ? If your blood glucose is 100 mg/dL (5.6 mmol/L) or lower, eat a snack containing 15-20 grams of carbohydrate. Check your blood glucose 15 minutes after the snack to make sure that your level is above 100 mg/dL (5.6 mmol/L) before you start your exercise.  Know the symptoms of low blood glucose (hypoglycemia) and how to treat it. Your risk for hypoglycemia increases during and after exercise. Common symptoms of hypoglycemia can include: ? Hunger. ? Anxiety. ? Sweating and feeling clammy. ? Confusion. ? Dizziness or feeling light-headed. ? Increased heart rate or palpitations. ? Blurry vision. ? Tingling or numbness around the mouth, lips, or tongue. ? Tremors or shakes. ? Irritability.  Keep a rapid-acting carbohydrate snack available before, during, and after exercise to help prevent or treat hypoglycemia.  Avoid injecting insulin into areas of the body that are going to be exercised. For example, avoid injecting insulin into: ? The arms, when playing tennis. ? The legs, when jogging.  Keep records of your exercise habits. Doing this can help you and your health care provider adjust your diabetes management plan as needed. Write down: ? Food that you eat before and after you exercise. ? Blood glucose levels before and after you exercise. ? The type and amount of exercise you have done. ? When your insulin is expected to peak, if you use   insulin. Avoid exercising at times when your insulin is peaking.  When you start a new exercise or activity, work with your health care provider to make sure the activity is safe for you, and to adjust your insulin, medicines, or food intake as needed.  Drink plenty of water while  you exercise to prevent dehydration or heat stroke. Drink enough fluid to keep your urine clear or pale yellow. Summary  Exercising regularly is important for your overall health, especially when you have diabetes (diabetes mellitus).  Exercising has many health benefits, such as increasing muscle strength and bone density and reducing body fat and stress.  Your health care provider or certified diabetes educator can help you make a plan for the type and frequency of exercise (activity plan) that works for you.  When you start a new exercise or activity, work with your health care provider to make sure the activity is safe for you, and to adjust your insulin, medicines, or food intake as needed. This information is not intended to replace advice given to you by your health care provider. Make sure you discuss any questions you have with your health care provider. Document Revised: 11/13/2016 Document Reviewed: 10/01/2015 Elsevier Patient Education  2020 [employer]  

## 2020-02-07 NOTE — Progress Notes (Signed)
I,Katawbba Wiggins,acting as a Education administrator for Maximino Greenland, MD.,have documented all relevant documentation on the behalf of Maximino Greenland, MD,as directed by  Maximino Greenland, MD while in the presence of Maximino Greenland, MD.  This visit occurred during the SARS-CoV-2 public health emergency.  Safety protocols were in place, including screening questions prior to the visit, additional usage of staff PPE, and extensive cleaning of exam room while observing appropriate contact time as indicated for disinfecting solutions.  Subjective:     Patient ID: Yvonne Miller , female    DOB: Dec 02, 1950 , 69 y.o.   MRN: [id number]   Chief Complaint  Patient presents with  . Diabetes  . Hypertension    HPI   Patient is here for dm follow up. She reports compliance with medications. She has no specific concerns today.   Diabetes She presents for her follow-up diabetic visit. She has type 2 diabetes mellitus. There are no hypoglycemic associated symptoms. Pertinent negatives for diabetes include no blurred vision and no chest pain. There are no hypoglycemic complications. Risk factors for coronary artery disease include diabetes mellitus, dyslipidemia, hypertension, post-menopausal, sedentary lifestyle and obesity. Current diabetic treatment includes insulin injections. She is compliant with treatment some of the time. She is following a diabetic diet. She participates in exercise intermittently.  Hypertension This is a chronic problem. The current episode started more than 1 year ago. The problem has been gradually improving since onset. The problem is controlled. Pertinent negatives include no blurred vision, chest pain, palpitations or shortness of breath. The current treatment provides moderate improvement. Compliance problems include exercise.  Hypertensive end-organ damage includes kidney disease and heart failure.     Past Medical History:  Diagnosis Date  . AICD (automatic  cardioverter/defibrillator) present   . Arthritis   . Asthma   . Carpal tunnel syndrome, bilateral   . CKD (chronic kidney disease)   . Diabetes mellitus   . Encounter for assessment of implantable cardioverter-defibrillator (ICD)   . GERD (gastroesophageal reflux disease)   . Hyperlipemia   . Hypertension   . Obesity    morbid  . Sleep apnea    wears CPAP set at 12  . Wears glasses      Family History  Problem Relation Age of Onset  . Hypertension Mother   . Stroke Mother   . Heart disease Father   . Heart attack Sister   . Heart attack Brother      Current Outpatient Medications:  .  ACCU-CHEK AVIVA PLUS test strip, CHECK 4 TIMES BY INTRADERMAL ROUTE EVERY DAY, Disp: 200 strip, Rfl: 11 .  albuterol (PROVENTIL HFA;VENTOLIN HFA) 108 (90 Base) MCG/ACT inhaler, Inhale 2 puffs into the lungs every 4 (four) hours as needed for wheezing or shortness of breath. , Disp: , Rfl:  .  amLODipine (NORVASC) 5 MG tablet, TAKE 1 TABLET BY MOUTH  DAILY, Disp: 90 tablet, Rfl: 3 .  aspirin EC 81 MG tablet, Take 81 mg by mouth at bedtime. , Disp: , Rfl:  .  B Complex Vitamins (B COMPLEX-B12) TABS, Take 1 tablet by mouth daily., Disp: , Rfl:  .  carvedilol (COREG) 25 MG tablet, TAKE 1 AND 1/2 TABLETS BY  MOUTH TWICE DAILY, Disp: 270 tablet, Rfl: 3 .  Cholecalciferol (VITAMIN D) 50 MCG (2000 UT) tablet, Take 2,000 Units by mouth daily., Disp: , Rfl:  .  Chromium 1000 MCG TABS, Take 1,000 mcg by mouth daily. , Disp: , Rfl:  .  Cinnamon 500 MG capsule, Take 500 mg by mouth daily., Disp: , Rfl:  .  fluticasone (FLONASE) 50 MCG/ACT nasal spray, Place 1 spray into both nostrils daily. (Patient taking differently: Place 1 spray into both nostrils as needed for allergies. ), Disp: 16 g, Rfl: 2 .  hydrALAZINE (APRESOLINE) 50 MG tablet, TAKE 1 TABLET BY MOUTH  TWICE DAILY, Disp: 180 tablet, Rfl: 3 .  insulin lispro protamine-lispro (HUMALOG 75/25 MIX) (75-25) 100 UNIT/ML SUSP injection, Inject 30-40 Units  into the skin daily before supper. (Patient taking differently: Inject 45 Units into the skin daily before supper. Sliding scale), Disp: 10 mL, Rfl: 3 .  nystatin (NYSTATIN) powder, Apply topically 3 (three) times daily as needed. As needed (Patient taking differently: Apply 1 g topically 3 (three) times daily as needed (yeast). ), Disp: 60 g, Rfl: 2 .  Omega-3 Fatty Acids (FISH OIL) 500 MG CAPS, Take 500 mg by mouth daily., Disp: , Rfl:  .  omeprazole (PRILOSEC) 40 MG capsule, Take 40 mg by mouth daily., Disp: , Rfl:  .  pravastatin (PRAVACHOL) 40 MG tablet, TAKE 1 TABLET BY MOUTH  DAILY, Disp: 90 tablet, Rfl: 3 .  Probiotic Product (PROBIOTIC PO), Take by mouth., Disp: , Rfl:  .  spironolactone (ALDACTONE) 25 MG tablet, TAKE 1 TABLET BY MOUTH IN  THE MORNING, Disp: 90 tablet, Rfl: 3 .  TRULICITY 1.5 IN/8.6VE SOPN, INJECT 1.5 MG INTO THE SKIN ONCE A WEEK. (Patient taking differently: Inject 1.5 mg as directed every Wednesday. ), Disp: 2 pen, Rfl: 2 .  valsartan-hydrochlorothiazide (DIOVAN-HCT) 160-25 MG tablet, TAKE 1 TABLET BY MOUTH  DAILY, Disp: 90 tablet, Rfl: 3 .  ROCKLATAN 0.02-0.005 % SOLN, SMARTSIG:1 Drop(s) In Eye(s) Every Evening, Disp: , Rfl:    Allergies  Allergen Reactions  . Other Other (See Comments)    NO BLOOD   . Jehovah witness     Review of Systems  Constitutional: Negative.   Eyes: Negative for blurred vision.  Respiratory: Negative.  Negative for shortness of breath.   Cardiovascular: Negative.  Negative for chest pain and palpitations.  Gastrointestinal: Negative.   Psychiatric/Behavioral: Negative.   All other systems reviewed and are negative.    Today's Vitals   02/07/20 0921  BP: 134/70  Pulse: 68  Temp: (!) 97.4 F (36.3 C)  TempSrc: Oral  Weight: 266 lb (120.7 kg)  Height: $Remove'5\' 3"'agfhybT$  (1.6 m)   Body mass index is 47.12 kg/m.   Objective:  Physical Exam Vitals and nursing note reviewed.  Constitutional:      Appearance: Normal appearance.  HENT:      Head: Normocephalic and atraumatic.  Cardiovascular:     Rate and Rhythm: Normal rate and regular rhythm.     Heart sounds: Normal heart sounds.  Pulmonary:     Effort: Pulmonary effort is normal.     Breath sounds: Normal breath sounds.  Skin:    General: Skin is warm.  Neurological:     General: No focal deficit present.     Mental Status: She is alert.  Psychiatric:        Mood and Affect: Mood normal.        Behavior: Behavior normal.         Assessment And Plan:     1. Type 2 diabetes mellitus with stage 3 chronic kidney disease, with long-term current use of insulin, unspecified whether stage 3a or 3b CKD ([facility]) Comments: Chronic, fair control. I will check labs as listed below.  Encouraged to increase her daily activity.  She will rto in 4 months for re-evaluation. - BMP8+EGFR - Hemoglobin A1c  2. Hypertensive heart and renal disease with renal failure, stage 1 through stage 4 or unspecified chronic kidney disease, with heart failure (HCC) Comments: Chronic, fair control. She will continue with current meds. She is encouraged to avoid adding salt to her foods.   3. Need for influenza vaccination Comments: She was given high dose flu vaccine.  - Flu Vaccine QUAD High Dose(Fluad)  4. Class 3 severe obesity due to excess calories with serious comorbidity and body mass index (BMI) of 45.0 to 49.9 in adult [facility]) Comments: She was made aware of 9 pound weight gain since June 2021. She is encouraged to increase her daily activity.   Wt Readings from Last 3 Encounters:  02/07/20 266 lb (120.7 kg)  12/05/19 263 lb 12.8 oz (119.7 kg)  10/05/19 257 lb 6.4 oz (116.8 kg)        Patient was given opportunity to ask questions. Patient verbalized understanding of the plan and was able to repeat key elements of the plan. All questions were answered to their satisfaction.  Maximino Greenland, MD   I, Maximino Greenland, MD, have reviewed all documentation for this visit. The  documentation on 02/07/20 for the exam, diagnosis, procedures, and orders are all accurate and complete.  THE PATIENT IS ENCOURAGED TO PRACTICE SOCIAL DISTANCING DUE TO THE COVID-19 PANDEMIC.

## 2020-02-08 LAB — HEMOGLOBIN A1C
Est. average glucose Bld gHb Est-mCnc: 163 mg/dL
Hgb A1c MFr Bld: 7.3 % — ABNORMAL HIGH (ref 4.8–5.6)

## 2020-02-08 LAB — BMP8+EGFR
BUN/Creatinine Ratio: 20 (ref 12–28)
BUN: 27 mg/dL (ref 8–27)
CO2: 22 mmol/L (ref 20–29)
Calcium: 9.5 mg/dL (ref 8.7–10.3)
Chloride: 100 mmol/L (ref 96–106)
Creatinine, Ser: 1.38 mg/dL — ABNORMAL HIGH (ref 0.57–1.00)
GFR calc Af Amer: 45 mL/min/{1.73_m2} — ABNORMAL LOW (ref >59)
GFR calc non Af Amer: 39 mL/min/{1.73_m2} — ABNORMAL LOW (ref >59)
Glucose: 74 mg/dL (ref 65–99)
Potassium: 4.8 mmol/L (ref 3.5–5.2)
Sodium: 137 mmol/L (ref 134–144)

## 2020-02-16 ENCOUNTER — Telehealth: Payer: Self-pay

## 2020-03-20 ENCOUNTER — Ambulatory Visit: Payer: Self-pay

## 2020-03-20 ENCOUNTER — Telehealth: Payer: Medicare Other

## 2020-03-20 ENCOUNTER — Other Ambulatory Visit: Payer: Self-pay

## 2020-03-20 DIAGNOSIS — I130 Hypertensive heart and chronic kidney disease with heart failure and stage 1 through stage 4 chronic kidney disease, or unspecified chronic kidney disease: Secondary | ICD-10-CM

## 2020-03-20 DIAGNOSIS — Z794 Long term (current) use of insulin: Secondary | ICD-10-CM

## 2020-03-20 DIAGNOSIS — I5032 Chronic diastolic (congestive) heart failure: Secondary | ICD-10-CM

## 2020-03-20 DIAGNOSIS — E785 Hyperlipidemia, unspecified: Secondary | ICD-10-CM

## 2020-03-20 DIAGNOSIS — E1122 Type 2 diabetes mellitus with diabetic chronic kidney disease: Secondary | ICD-10-CM

## 2020-03-20 DIAGNOSIS — N183 Chronic kidney disease, stage 3 unspecified: Secondary | ICD-10-CM

## 2020-03-21 ENCOUNTER — Telehealth: Payer: Self-pay | Admitting: *Deleted

## 2020-03-21 NOTE — Chronic Care Management (AMB) (Signed)
  Care Management   Note  03/21/2020 Name: Yvonne Miller MRN: [id number] DOB: 08/04/1950  Yvonne Miller is a 69 y.o. year old female who is a primary care patient of [name], MD and is actively engaged with the care management team. I reached out to Yvonne Miller by phone today to assist with re-scheduling a follow up visit with the Pharmacist  Follow up plan: Telephone appointment with care management team member scheduled for: 03/28/2020  [place], [place] [facility]  [place], [place] [address] Direct Dial: [email or web address] Website: [email or web address]

## 2020-03-22 NOTE — Patient Instructions (Addendum)
Visit Information  Goals Addressed      Patient Stated   .  "I want to improve my kidney function" (pt-stated)   On track     CARE PLAN ENTRY (see longtitudinal plan of care for additional care plan information)  Current Barriers:  [name] Knowledge Deficits related to disease process and Self Health management of stage III chronic kidney disease . Chronic Disease Management support and education needs related to T2DM, HTN, HLD, CKD, CHF  Nurse Case Manager Clinical Goal(s):  . New 03/20/20 Over the next 90 days, patient will work with CCM RN CM and PCP to address needs related to disease education and support for improved Self Health management of stage III CKD  CCM RN CM Interventions:  03/20/20 call completed with patient  . Evaluation of current treatment plan related to stage III chronic kidney disease and patient's adherence to plan as established by provider. . Provided education to patient re: current GFR 45; Re-educated patient on stages of CKD and how to improve with appropriate Self Health management, increasing water intake to improve renal function, maintain healthy weight, exercise and maintaining health diet, keep BP and DM well controlled . Discussed plans with patient for ongoing care management follow up and provided patient with direct contact information for care management team . Provided patient with printed educational materials related to Eating Right for Chronic Kidney Disease; Salt Substitutes for Kidney patients  Patient Self Care Activities:  . Self administers medications as prescribed . Attends all scheduled provider appointments . Calls pharmacy for medication refills . Calls provider office for new concerns or questions  Please see past updates related to this goal by clicking on the "Past Updates" button in the selected goal      .  "I would like to keep my BP under good control" (pt-stated)   On track     CARE PLAN ENTRY (see longtitudinal plan of care  for additional care plan information)  Current Barriers:  [name] Knowledge Deficits related to disease process and Self Health management of Hypertension . Chronic Disease Management support and education needs related to T2DM, HTN, HLD, CKD, CHF  Nurse Case Manager Clinical Goal(s):  . New 03/20/20 Over the next 90 days, patient will work with the CCM team and PCP to address needs related to disease education and support to improve Self Health management of Hypertension    CCM RN CM Interventions:  03/20/20 call completed with patient  . Evaluation of current treatment plan related to Hypertension and patient's adherence to plan as established by provider. . Provided education to patient re:  recent in office BP 134/70; Re-educated on target BP <130/80; Determined patient has met her target BP and is being managed by Dr. Jacinto Halim for HTN/CHF with next scheduled f/u set for 08/31/20  . Reviewed medications with patient and discussed indication, dosage and frequency of prescribed mediations and importance of patient taking her medications exactly as prescribed w/o missed doses; patient reports adherence, denies financial hardship; Current regimen:  o amLODipine (NORVASC) 5 MG tablet, TAKE 1 TABLET BY MOUTH  DAILY o carvedilol (COREG) 25 MG tablet, TAKE 1 AND 1/2 TABLETS BY  MOUTH TWICE DAILY o hydrALAZINE (APRESOLINE) 50 MG tablet, TAKE 1 TABLET BY MOUTH  TWICE DAILY o spironolactone (ALDACTONE) 25 MG tablet, TAKE 1 TABLET BY MOUTH IN  THE MORNING o valsartan-hydrochlorothiazide (DIOVAN-HCT) 160-25 MG tablet, TAKE 1 TABLET BY MOUTH  DAILY . Mailed printed educational material related to, "Why Should I Lower  Sodium?"; "Life's Simple 7" . Discussed plans with patient for ongoing care management follow up and provided patient with direct contact information for care management team  Patient Self Care Activities:  . Self administers medications as prescribed . Attends all scheduled provider  appointments . Calls pharmacy for medication refills . Calls provider office for new concerns or questions  Please see past updates related to this goal by clicking on the "Past Updates" button in the selected goal      .  "I would like to lower my A1c" (pt-stated)   On track     [place] (see longtitudinal plan of care for additional care plan information)  Current Barriers:  [name] Knowledge Deficits related to disease process and Self Health management of type 2 DM . Chronic Disease Management support and education needs related to T2DM, HTN, HLD, CKD, CHF  Nurse Case Manager Clinical Goal(s):  . New 03/20/20 Over the next 90 days, patient will work with CCM RN CM and PCP to address needs related to disease education and support for improved Self Health management of DM and lower A1c  CCM RN CM Interventions:  03/20/20 call completed with patient  . Evaluation of current treatment plan related to Diabetes and patient's adherence to plan as established by provider. . Provided education to patient re: current A1c is down to 7.3, Re-educated on target A1c <7.0;  Re-educated on daily glycemic control, FBS 80-130, <180 after meals; Re-educated on dietary and exercise recommendations . Reviewed medications with patient and discussed indication dosage and frequency of prescribed medications; patient reports adherence , denies noted SE; Current regimen:  o insulin lispro protamine-lispro (HUMALOG 75/25 MIX) (75-25) 100 UNIT/ML SUSP injection, Inject 30-40 Units into the skin daily before supper.   (Patient taking differently: Inject 45 Units into the skin daily before supper. Sliding scale) o TRULICITY 1.5 WC/3.7SE SOPN, INJECT 1.5 MG INTO THE SKIN ONCE A WEEK. (Patient taking differently: Inject 1.5 mg as directed every Wednesday.) . Provided patient with printed educational materials related to Diabetes Care Schedule; Grocery Shopping with Diabetes; Preventing Complications from  Diabetes . Advised patient, providing education and rationale, to check cbg 1-2 times daily before meals and record, calling RN CM and or PCP for findings outside established parameters . Discussed plans with patient for ongoing care management follow up and provided patient with direct contact information for care management team  Patient Self Care Activities:  . Self administers medications as prescribed . Attends all scheduled provider appointments . Calls pharmacy for medication refills . Calls provider office for new concerns or questions  Please see past updates related to this goal by clicking on the "Past Updates" button in the selected goal        The patient verbalized understanding of instructions, educational materials, and care plan provided today and declined offer to receive copy of patient instructions, educational materials, and care plan.   Telephone follow up appointment with care management team member scheduled for: 06/22/20  Barb Merino, RN, BSN, CCM Care Management Coordinator [facility]  Direct Phone: [phone]

## 2020-03-22 NOTE — Chronic Care Management (AMB) (Signed)
Chronic Care Management   Follow Up Note   03/20/2020 Name: Yvonne Miller MRN: [id number] DOB: 03-03-1951  Referred by: [name], MD Reason for referral : Chronic Care Management (RNCM FU Call )   ARAEYA LAMB is a 69 y.o. year old female who is a primary care patient of [name], MD. The CCM team was consulted for assistance with chronic disease management and care coordination needs.    Review of patient status, including review of consultants reports, relevant laboratory and other test results, and collaboration with appropriate care team members and the patient's provider was performed as part of comprehensive patient evaluation and provision of chronic care management services.    SDOH (Social Determinants of Health) assessments performed: Yes - no acute challenges identified  See Care Plan activities for detailed interventions related to [place])   Placed CCM RN CM outbound follow up call to patient for a care plan update.     Outpatient Encounter Medications as of 03/20/2020  Medication Sig  . ACCU-CHEK AVIVA PLUS test strip CHECK 4 TIMES BY INTRADERMAL ROUTE EVERY DAY  . albuterol (PROVENTIL HFA;VENTOLIN HFA) 108 (90 Base) MCG/ACT inhaler Inhale 2 puffs into the lungs every 4 (four) hours as needed for wheezing or shortness of breath.   [name] amLODipine (NORVASC) 5 MG tablet TAKE 1 TABLET BY MOUTH  DAILY  . aspirin EC 81 MG tablet Take 81 mg by mouth at bedtime.   . B Complex Vitamins (B COMPLEX-B12) TABS Take 1 tablet by mouth daily.  . carvedilol (COREG) 25 MG tablet TAKE 1 AND 1/2 TABLETS BY  MOUTH TWICE DAILY  . Cholecalciferol (VITAMIN D) 50 MCG (2000 UT) tablet Take 2,000 Units by mouth daily.  . Chromium 1000 MCG TABS Take 1,000 mcg by mouth daily.   . Cinnamon 500 MG capsule Take 500 mg by mouth daily.  . hydrALAZINE (APRESOLINE) 50 MG tablet TAKE 1 TABLET BY MOUTH  TWICE DAILY  . insulin lispro protamine-lispro (HUMALOG 75/25 MIX) (75-25) 100 UNIT/ML SUSP  injection Inject 30-40 Units into the skin daily before supper. (Patient taking differently: Inject 45 Units into the skin daily before supper. Sliding scale)  . nystatin (NYSTATIN) powder Apply topically 3 (three) times daily as needed. As needed (Patient taking differently: Apply 1 g topically 3 (three) times daily as needed (yeast). )  . Omega-3 Fatty Acids (FISH OIL) 500 MG CAPS Take 500 mg by mouth daily.  [name] omeprazole (PRILOSEC) 40 MG capsule Take 40 mg by mouth daily.  . pravastatin (PRAVACHOL) 40 MG tablet TAKE 1 TABLET BY MOUTH  DAILY  . Probiotic Product (PROBIOTIC PO) Take by mouth.  [name] ROCKLATAN 0.02-0.005 % SOLN SMARTSIG:1 Drop(s) In Eye(s) Every Evening  . spironolactone (ALDACTONE) 25 MG tablet TAKE 1 TABLET BY MOUTH IN  THE MORNING  . TRULICITY 1.5 SE/8.3TD SOPN INJECT 1.5 MG INTO THE SKIN ONCE A WEEK. (Patient taking differently: Inject 1.5 mg as directed every Wednesday. )  . valsartan-hydrochlorothiazide (DIOVAN-HCT) 160-25 MG tablet TAKE 1 TABLET BY MOUTH  DAILY   No facility-administered encounter medications on file as of 03/20/2020.     Objective:  Lab Results  Component Value Date   HGBA1C 7.3 (H) 02/07/2020   HGBA1C 7.4 (H) 10/05/2019   HGBA1C 7.7 (H) 06/16/2019   Lab Results  Component Value Date   MICROALBUR 30 10/05/2019   LDLCALC 73 06/16/2019   CREATININE 1.38 (H) 02/07/2020   BP Readings from Last 3 Encounters:  02/07/20 134/70  12/05/19  116/74  10/05/19 112/76    Goals Addressed      Patient Stated   .  "I want to improve my kidney function" (pt-stated)   On track     [place] (see longtitudinal plan of care for additional care plan information)  Current Barriers:  [name] Knowledge Deficits related to disease process and Self Health management of stage III chronic kidney disease . Chronic Disease Management support and education needs related to T2DM, HTN, HLD, CKD, CHF  Nurse Case Manager Clinical Goal(s):  . New 03/20/20 Over the next 90  days, patient will work with CCM RN CM and PCP to address needs related to disease education and support for improved Self Health management of stage III CKD  CCM RN CM Interventions:  03/20/20 call completed with patient  . Evaluation of current treatment plan related to stage III chronic kidney disease and patient's adherence to plan as established by provider. . Provided education to patient re: current GFR 45; Re-educated patient on stages of CKD and how to improve with appropriate Self Health management, increasing water intake to improve renal function, maintain healthy weight, exercise and maintaining health diet, keep BP and DM well controlled . Discussed plans with patient for ongoing care management follow up and provided patient with direct contact information for care management team . Provided patient with printed educational materials related to Eating Right for Chronic Kidney Disease; Salt Substitutes for Kidney patients  Patient Self Care Activities:  . Self administers medications as prescribed . Attends all scheduled provider appointments . Calls pharmacy for medication refills . Calls provider office for new concerns or questions  Please see past updates related to this goal by clicking on the "Past Updates" button in the selected goal      .  "I would like to keep my BP under good control" (pt-stated)   On track     [place] (see longtitudinal plan of care for additional care plan information)  Current Barriers:  [name] Knowledge Deficits related to disease process and Self Health management of Hypertension . Chronic Disease Management support and education needs related to T2DM, HTN, HLD, CKD, CHF  Nurse Case Manager Clinical Goal(s):  . New 03/20/20 Over the next 90 days, patient will work with the CCM team and PCP to address needs related to disease education and support to improve Self Health management of Hypertension    CCM RN CM Interventions:  03/20/20 call  completed with patient  . Evaluation of current treatment plan related to Hypertension and patient's adherence to plan as established by provider. . Provided education to patient re:  recent in office BP 134/70; Re-educated on target BP <130/80; Determined patient has met her target BP and is being managed by Dr. Einar Gip for HTN/CHF with next scheduled f/u set for 08/31/20  . Reviewed medications with patient and discussed indication, dosage and frequency of prescribed mediations and importance of patient taking her medications exactly as prescribed w/o missed doses; patient reports adherence, denies financial hardship; Current regimen:  o amLODipine (NORVASC) 5 MG tablet, TAKE 1 TABLET BY MOUTH  DAILY o carvedilol (COREG) 25 MG tablet, TAKE 1 AND 1/2 TABLETS BY  MOUTH TWICE DAILY o hydrALAZINE (APRESOLINE) 50 MG tablet, TAKE 1 TABLET BY MOUTH  TWICE DAILY o spironolactone (ALDACTONE) 25 MG tablet, TAKE 1 TABLET BY MOUTH IN  THE MORNING o valsartan-hydrochlorothiazide (DIOVAN-HCT) 160-25 MG tablet, TAKE 1 TABLET BY MOUTH  DAILY . Programmer, systems related  to, "Why Should I Lower Sodium?"; "Life's Simple 7" . Discussed plans with patient for ongoing care management follow up and provided patient with direct contact information for care management team  Patient Self Care Activities:  . Self administers medications as prescribed . Attends all scheduled provider appointments . Calls pharmacy for medication refills . Calls provider office for new concerns or questions  Please see past updates related to this goal by clicking on the "Past Updates" button in the selected goal      .  "I would like to lower my A1c" (pt-stated)   On track     [place] (see longtitudinal plan of care for additional care plan information)  Current Barriers:  [name] Knowledge Deficits related to disease process and Self Health management of type 2 DM . Chronic Disease Management support and education  needs related to T2DM, HTN, HLD, CKD, CHF  Nurse Case Manager Clinical Goal(s):  . New 03/20/20 Over the next 90 days, patient will work with CCM RN CM and PCP to address needs related to disease education and support for improved Self Health management of DM and lower A1c  CCM RN CM Interventions:  03/20/20 call completed with patient  . Evaluation of current treatment plan related to Diabetes and patient's adherence to plan as established by provider. . Provided education to patient re: current A1c is down to 7.3, Re-educated on target A1c <7.0;  Re-educated on daily glycemic control, FBS 80-130, <180 after meals; Re-educated on dietary and exercise recommendations . Reviewed medications with patient and discussed indication dosage and frequency of prescribed medications; patient reports adherence , denies noted SE; Current regimen:  o insulin lispro protamine-lispro (HUMALOG 75/25 MIX) (75-25) 100 UNIT/ML SUSP injection, Inject 30-40 Units into the skin daily before supper.   (Patient taking differently: Inject 45 Units into the skin daily before supper. Sliding scale) o TRULICITY 1.5 ZO/1.0RU SOPN, INJECT 1.5 MG INTO THE SKIN ONCE A WEEK. (Patient taking differently: Inject 1.5 mg as directed every Wednesday.) . Provided patient with printed educational materials related to Diabetes Care Schedule; Grocery Shopping with Diabetes; Preventing Complications from Diabetes . Advised patient, providing education and rationale, to check cbg 1-2 times daily before meals and record, calling RN CM and or PCP for findings outside established parameters . Discussed plans with patient for ongoing care management follow up and provided patient with direct contact information for care management team  Patient Self Care Activities:  . Self administers medications as prescribed . Attends all scheduled provider appointments . Calls pharmacy for medication refills . Calls provider office for new concerns or  questions  Please see past updates related to this goal by clicking on the "Past Updates" button in the selected goal        Plan:   Telephone follow up appointment with care management team member scheduled for: 06/22/20  Barb Merino, RN, BSN, CCM Care Management Coordinator [facility]  Direct Phone: [phone]

## 2020-03-28 ENCOUNTER — Telehealth: Payer: Medicare Other

## 2020-04-11 ENCOUNTER — Telehealth: Payer: Self-pay

## 2020-04-11 NOTE — Chronic Care Management (AMB) (Signed)
Chronic Care Management Pharmacy Assistant   Name: ALZADA BRAZEE  MRN: [id number] DOB: Apr 05, 1951  Reason for Encounter: Medication Review  PCP : [name], MD   04/11/2020- Humalog and Trulicity patient assistance forms filled out from [employer], awaiting provider signature, patient has already signed, will need income documentation.  04/25/2020- Provider signed forms. Patient signed forms also but aware we are waiting on income documentation to send over to [employer].   Allergies:   Allergies  Allergen Reactions  . Other Other (See Comments)    NO BLOOD   . Jehovah witness    Medications: Outpatient Encounter Medications as of 04/11/2020  Medication Sig  . ACCU-CHEK AVIVA PLUS test strip CHECK 4 TIMES BY INTRADERMAL ROUTE EVERY DAY  . albuterol (PROVENTIL HFA;VENTOLIN HFA) 108 (90 Base) MCG/ACT inhaler Inhale 2 puffs into the lungs every 4 (four) hours as needed for wheezing or shortness of breath.   [name] amLODipine (NORVASC) 5 MG tablet TAKE 1 TABLET BY MOUTH  DAILY  . aspirin EC 81 MG tablet Take 81 mg by mouth at bedtime.   . B Complex Vitamins (B COMPLEX-B12) TABS Take 1 tablet by mouth daily.  . carvedilol (COREG) 25 MG tablet TAKE 1 AND 1/2 TABLETS BY  MOUTH TWICE DAILY  . Cholecalciferol (VITAMIN D) 50 MCG (2000 UT) tablet Take 2,000 Units by mouth daily.  . Chromium 1000 MCG TABS Take 1,000 mcg by mouth daily.   . Cinnamon 500 MG capsule Take 500 mg by mouth daily.  . fluticasone (FLONASE) 50 MCG/ACT nasal spray Place 1 spray into both nostrils daily. (Patient taking differently: Place 1 spray into both nostrils as needed for allergies. )  . hydrALAZINE (APRESOLINE) 50 MG tablet TAKE 1 TABLET BY MOUTH  TWICE DAILY  . insulin lispro protamine-lispro (HUMALOG 75/25 MIX) (75-25) 100 UNIT/ML SUSP injection Inject 30-40 Units into the skin daily before supper. (Patient taking differently: Inject 45 Units into the skin daily before supper. Sliding scale)  . nystatin  (NYSTATIN) powder Apply topically 3 (three) times daily as needed. As needed (Patient taking differently: Apply 1 g topically 3 (three) times daily as needed (yeast). )  . Omega-3 Fatty Acids (FISH OIL) 500 MG CAPS Take 500 mg by mouth daily.  [name] omeprazole (PRILOSEC) 40 MG capsule Take 40 mg by mouth daily.  . pravastatin (PRAVACHOL) 40 MG tablet TAKE 1 TABLET BY MOUTH  DAILY  . Probiotic Product (PROBIOTIC PO) Take by mouth.  [name] ROCKLATAN 0.02-0.005 % SOLN SMARTSIG:1 Drop(s) In Eye(s) Every Evening  . spironolactone (ALDACTONE) 25 MG tablet TAKE 1 TABLET BY MOUTH IN  THE MORNING  . TRULICITY 1.5 MP/5.3IR SOPN INJECT 1.5 MG INTO THE SKIN ONCE A WEEK. (Patient taking differently: Inject 1.5 mg as directed every Wednesday. )  . valsartan-hydrochlorothiazide (DIOVAN-HCT) 160-25 MG tablet TAKE 1 TABLET BY MOUTH  DAILY   No facility-administered encounter medications on file as of 04/11/2020.    Current Diagnosis: Patient Active Problem List   Diagnosis Date Noted  . Non-ischemic cardiomyopathy ([place]) 02/18/2019  . Encounter for assessment of implantable cardioverter-defibrillator (ICD)   . Type 2 diabetes mellitus with stage 3 chronic kidney disease, with long-term current use of insulin ([place]) 03/04/2018  . 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  . Chronic combined systolic and diastolic congestive heart failure ([place]) 03/04/2018  . Chronic renal disease, stage II 03/04/2018  . Status post revision of total replacement of left knee 01/26/2018  . Unilateral primary osteoarthritis, left  knee 08/18/2017  . Biventricular automatic implantable cardioverter defibrillator in situ 12/13/2013  . ICD -  Biventricular  [employer] Dynagen X4 CRT-D Model G158 ICD in place 10/28/13 10/28/2013  . HTN (hypertension) 10/03/2013  . Hyperlipemia 10/03/2013  . LBBB (left bundle branch block) 10/03/2013  . Cardiomyopathy ([place]) 10/03/2013  . DM (diabetes mellitus) ([place]) 10/03/2013     Follow-Up:   Patient Assistance Coordination  05/02/2020- Called patient, left message to return call, inquiring about income documentation.  05/03/2020- Called patient to follow up on income documentation needed to send her patient assistance forms to [employer]. Left message to bring to PCP office, she can mail or fax to PCP.  [name], [place] Pharmacist Assistant [phone]

## 2020-04-19 DIAGNOSIS — I5022 Chronic systolic (congestive) heart failure: Secondary | ICD-10-CM | POA: Diagnosis not present

## 2020-04-19 DIAGNOSIS — Z95810 Presence of automatic (implantable) cardiac defibrillator: Secondary | ICD-10-CM | POA: Diagnosis not present

## 2020-04-19 DIAGNOSIS — Z4502 Encounter for adjustment and management of automatic implantable cardiac defibrillator: Secondary | ICD-10-CM | POA: Diagnosis not present

## 2020-04-25 ENCOUNTER — Ambulatory Visit: Payer: Medicare Other

## 2020-04-25 DIAGNOSIS — I5042 Chronic combined systolic (congestive) and diastolic (congestive) heart failure: Secondary | ICD-10-CM

## 2020-04-25 DIAGNOSIS — Z794 Long term (current) use of insulin: Secondary | ICD-10-CM

## 2020-04-25 DIAGNOSIS — E785 Hyperlipidemia, unspecified: Secondary | ICD-10-CM

## 2020-04-25 DIAGNOSIS — E1122 Type 2 diabetes mellitus with diabetic chronic kidney disease: Secondary | ICD-10-CM

## 2020-04-25 DIAGNOSIS — I130 Hypertensive heart and chronic kidney disease with heart failure and stage 1 through stage 4 chronic kidney disease, or unspecified chronic kidney disease: Secondary | ICD-10-CM

## 2020-04-25 DIAGNOSIS — N183 Chronic kidney disease, stage 3 unspecified: Secondary | ICD-10-CM

## 2020-04-25 NOTE — Chronic Care Management (AMB) (Signed)
Chronic Care Management Pharmacy  Name: Yvonne Miller  MRN: [id number] DOB: Jul 24, 1950  Chief Complaint/ HPI  Yvonne Miller,  69 y.o. , female presents for their Initial CCM visit with the clinical pharmacist via telephone due to COVID-19 Pandemic. Patient is from [place], [place]. Her family moved to [place], [place]. Her father was a Designer, fashion/clothing and then worked in [employer]. Once she graduated from high school she went with a Pharmacist, hospital who lived in [place] to work at a summer camp. She ended up staying in [place] and went to [facility] for a few months. She went to [place] and continued her studies and was working for [employer]. She came to [facility] for a funeral and never left. She has been married for 51 years, and has two children and 6 grand children. She went back to school in 1999 at [employer] and got her degree in [place] studies, English an dconcentrations in Clinical research associate and [place]. Her children are 58 and 41. 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 She focuses on helping people read the bible and how to understand Gods word. She talks to all people of all denominations. She does a lot of telephone witnessing and letter writing due to COVID-19. She normally wakes up 6:30 am she takes her grand daughter to school everyday. She takes her medication and then eats and checks her sugar. She then joins the ministry and she is done by 1 pm and she eats lunch and checks her sugar. She has a 51 year old mother and she is the only sibling left. Her brother was killed in a hit and run in [place], [place] in September. She helps her mother with cooking and cleaning and running errands. Her life is between her mom, ministry and her family in different orders. She reports that some days she has trouble sleeping and other days she  can not get enough sleep. Her husband works at a partime job and he gets of at 34 PM so they talk and chat until 12 AM. Patient sees Dr. Einar Gip which is her cardiologist On Wednesdays she has a meeting for bible study and on Thursdays she has a meeting that includes praise and worship service. On Sunday she has a regular meeting from 10 AM- 11:45 AM which includes a lecture and the study of the [place].   PCP : [name], MD  Their chronic conditions include: hypertension, type 2 diabetes, hyperlipidemia, CKD stage 2, Chronic Heart Failure  Office Visits:  02/07/2020 Dr. Baird Cancer OV: Patient dose changed to Spironolactone 25 mg tablet daily in the morning.   Consult Visit:  12/06/2019 Optometry OV  Medications: Outpatient Encounter Medications as of 04/25/2020  Medication Sig  . ACCU-CHEK AVIVA PLUS test strip CHECK 4 TIMES BY INTRADERMAL ROUTE EVERY DAY  . albuterol (PROVENTIL HFA;VENTOLIN HFA) 108 (90 Base) MCG/ACT inhaler Inhale 2 puffs into the lungs every 4 (four) hours as needed for wheezing or shortness of breath.   [name] amLODipine (NORVASC) 5 MG tablet TAKE 1 TABLET BY MOUTH  DAILY  . aspirin EC 81 MG tablet Take 81 mg by mouth at bedtime.   . B Complex Vitamins (B COMPLEX-B12) TABS Take 1 tablet by mouth daily.  . carvedilol (COREG) 25 MG tablet TAKE 1 AND 1/2 TABLETS  BY  MOUTH TWICE DAILY  . Cholecalciferol (VITAMIN D) 50 MCG (2000 UT) tablet Take 2,000 Units by mouth daily.  . Chromium 1000 MCG TABS Take 1,000 mcg by mouth daily.   . Cinnamon 500 MG capsule Take 500 mg by mouth daily.  . fluticasone (FLONASE) 50 MCG/ACT nasal spray Place 1 spray into both nostrils daily. (Patient taking differently: Place 1 spray into both nostrils as needed for allergies. )  . hydrALAZINE (APRESOLINE) 50 MG tablet TAKE 1 TABLET BY MOUTH  TWICE DAILY  . insulin lispro protamine-lispro (HUMALOG 75/25 MIX) (75-25) 100 UNIT/ML SUSP injection Inject 30-40 Units into the skin daily before supper.  (Patient taking differently: Inject 45 Units into the skin daily before supper. Sliding scale)  . nystatin (NYSTATIN) powder Apply topically 3 (three) times daily as needed. As needed (Patient taking differently: Apply 1 g topically 3 (three) times daily as needed (yeast). )  . Omega-3 Fatty Acids (FISH OIL) 500 MG CAPS Take 500 mg by mouth daily.  [name] omeprazole (PRILOSEC) 40 MG capsule Take 40 mg by mouth daily.  . pravastatin (PRAVACHOL) 40 MG tablet TAKE 1 TABLET BY MOUTH  DAILY  . Probiotic Product (PROBIOTIC PO) Take by mouth.  [name] ROCKLATAN 0.02-0.005 % SOLN SMARTSIG:1 Drop(s) In Eye(s) Every Evening  . spironolactone (ALDACTONE) 25 MG tablet TAKE 1 TABLET BY MOUTH IN  THE MORNING  . TRULICITY 1.5 ZG/0.1VC SOPN INJECT 1.5 MG INTO THE SKIN ONCE A WEEK. (Patient taking differently: Inject 1.5 mg as directed every Wednesday. )  . valsartan-hydrochlorothiazide (DIOVAN-HCT) 160-25 MG tablet TAKE 1 TABLET BY MOUTH  DAILY   No facility-administered encounter medications on file as of 04/25/2020.     Current Diagnosis/Assessment:  Goals Addressed            This Visit's Progress   . Pharmacy Care Plan       CARE PLAN ENTRY (see longitudinal plan of care for additional care plan information)  Current Barriers:  . Chronic Disease Management support, education, and care coordination needs related to Hypertension, Hyperlipidemia, Diabetes, and Heart Failure   Hypertension BP Readings from Last 3 Encounters:  10/05/19 112/76  09/24/19 129/77  09/01/19 128/76   . Pharmacist Clinical Goal(s): o Over the next 90 days, patient will work with PharmD and providers to maintain BP goal <130/80 . Current regimen:  . Amlodipine 17m daily . Valsartan/HCTZ 160/217mdaily . Hydralazine 5077mwice daily . Interventions: o Dietary and exercise recommendations provided - Limit salt intake - Increase physical activity to 30 minutes 3 times weekly, with a long term goal of 30 minutes 5 times  weekly . Patient self care activities - Over the next 90 days, patient will: o Check BP twice per day, document, and provide at future appointments o Ensure daily salt intake < 2300 mg/day o Check BP daily to every other day and if symptomatic, document, and provide at future appointments o Try to exercise for 30 minutes 3 times per week  Hyperlipidemia Lab Results  Component Value Date/Time   LDLCALC 73 06/16/2019 09:23 AM   . Pharmacist Clinical Goal(s): o Over the next 90 days, patient will work with PharmD and providers to achieve LDL goal < 70 . Current regimen:  . Pravastatin 53m16mily . Omega 3 fatty acids 500mg69mly . Aspirin 81 MG daily . Interventions: o Dietary and exercise recommendations provided - Increase intake of healthy fats (i.e avocados, walnuts, flaxseed) - Decrease intake of foods high in saturated and trans  fat (i.e. chips) . Patient self care activities - Over the next 90 days, patient will: o Work to improve diet, increase heart healthy fats and decrease trans/saturated fats  Diabetes Lab Results  Component Value Date/Time   HGBA1C 7.4 (H) 10/05/2019 10:14 AM   HGBA1C 7.7 (H) 06/16/2019 09:23 AM   . Pharmacist Clinical Goal(s): o Over the next 90 days, patient will work with PharmD and providers to achieve A1c goal <7% . Current regimen:  . Trulicity 6.0VP weekly . Humalog mix 75/25 30-40 units daily before supper . Interventions: . Patient assistance approval for 2022 . Proper medication administration .  [name] Patient self care activities - Over the next 90 days, patient will: o Check blood sugar twice daily, document, and provide at future appointments o Contact provider with any episodes of hypoglycemia o Check blood sugar once daily, document, and provide at future appointments o Contact provider with any episodes of hypoglycemia o Try to exercise for 30 minutes daily 5 times per week  Heart Failure . Pharmacist Clinical Goal(s) o Over the  next 90 days, patient will work with PharmD and providers to manage heart failure symptoms and keep a high quality of life . Current regimen:  . Spironolactone 42m daily, 234mdaily  . Carvedilol 2561m.5 tablets twice daily . Valsartan - HCTZ 120/25 MG - take daily . Pravastatin 40 MG - take daily  . Aspirin 81 MG- take daily . Amlodipine 5 MG - take daily Interventions: o Weighing daily, if you gain more than 3 pounds in one day or 5 pounds in one week call your doctor . Patient self care activities - Over the next 90 days, patient will: o Limiting salt in diet o Medication adherence  Medication management . Pharmacist Clinical Goal(s): o Over the next 90 days, patient will work with PharmD and providers to maintain optimal medication adherence . [facility] . Interventions o Comprehensive medication review performed. o Continue current medication management strategy . Patient self care activities - Over the next 90 days, patient will: o Focus on medication adherence by keeping a routine schedule for taking medication o Take medications as prescribed o Report any questions or concerns to PharmD and/or provider(s)  Initial goal documentation        Hypertension   BP today is:  <130/80  Office blood pressures are  BP Readings from Last 3 Encounters:  02/07/20 134/70  12/05/19 116/74  10/05/19 112/76    Patient has failed these meds in the past:   Patient is currently Controlled  on the following medications:  . Amlodipine 5 MG- take daily . Carvedilol 25 MG - take 1.5 tablets by mouth daily . Valsartan-hydrochlorothiazide 160-25 MG - take 1 tablet by mouth daily  . Spironolactone 25 MG -take daily  Patient checks BP at home twice daily  Patient home BP readings are ranging: <130/80  117-120/65-78   We discussed :   Pt endorses 100% compliance to medication regimen   Exercise  Goal to exercise 3 days per week for 30  minutes   Diet  She is not adding a lot of salt to her meals   Patient has a family history of heart issues  Her father and siblings all had hypertension    Plan  Continue current medications     Diabetes   A1c goal <8%  Recent Relevant Labs: Lab Results  Component Value Date/Time   HGBA1C 7.3 (H) 02/07/2020 10:50 AM  HGBA1C 7.4 (H) 10/05/2019 10:14 AM   MICROALBUR 30 10/05/2019 12:07 PM   MICROALBUR 10 09/30/2018 03:26 PM    Last diabetic Eye exam:  Lab Results  Component Value Date/Time   HMDIABEYEEXA No Retinopathy 12/02/2019 12:00 AM    Last diabetic Foot exam: No results found for: HMDIABFOOTEX   Checking BG: 3x per Day  Recent FBG Readings: 90-124 Recent pre-meal BG readings:  Recent 2hr PP BG readings:  Around 170  Recent HS BG readings: 180-190  Patient has failed these meds in past:  Patient is currently controlled on the following medications: . Humalog 75/25 100 Unit/ML Suspension- inject 30 - 40 units into the skin daily before supper  . Trulicity 1.5 MG/ - Inject 1.5 MG once a week on Wednesday   We discussed:   Patient Assistance - patient has been approved through [place] for 2022  She has a delivery scheduled for 12/24  She considers hyperglycemia to be greater than 200   She reports having episodes of Low BS (60-70) it has happened once this month - 65  This rarely happens and she is not used to this happening   She is aware of signs and symptoms of hypoglycemia - only happens when she does not eat  How to increase her BS  4 Peppermints in the drawer beside her bed    Orange Juice  Diet extensively   Breakfast: She drinks Golden Milk-almond milk, cinnamon and black pepper and coconut oil that she enjoys. Oatmeal with raisins and apples with brown sugar, and an egg sometimes a smoothie   Snack   Lunch: she eats a salad, tomato, cucumber and red onion-she might add tuna with water, or canned chicken.   Dinner: She enjoys all  types of meat  Fish, Chicken, Pork - once a week or every other week.   Patient reports using minimal salt   Stir Fry  Evening Snack : a graham cracker or some sort of protein   She does not eat bread  Plan  Continue current medications   Hyperlipidemia   LDL goal < 70  Last lipids Lab Results  Component Value Date   CHOL 135 06/16/2019   HDL 37 (L) 06/16/2019   LDLCALC 73 06/16/2019   TRIG 145 06/16/2019   CHOLHDL 3.6 06/16/2019   Hepatic Function Latest Ref Rng & Units 10/05/2019 06/16/2019 09/30/2018  Total Protein 6.0 - 8.5 g/dL 7.4 7.0 7.4  Albumin 3.8 - 4.8 g/dL 4.3 4.1 4.6  AST 0 - 40 IU/L _0 ALT 0 - 32 IU/L _1 Alk Phosphatase 48 - 121 IU/L 98 107 99  Total Bilirubin 0.0 - 1.2 mg/dL <0.2 <0.2 0.2     The 10-year ASCVD risk score Mikey Bussing DC Jr., et al., 2013) is: 20%   Values used to calculate the score:     Age: 3 years     Sex: Female     Is Non-Hispanic African American: Yes     Diabetic: Yes     Tobacco smoker: No     Systolic Blood Pressure: 357 mmHg     Is BP treated: Yes     HDL Cholesterol: 37 mg/dL     Total Cholesterol: 135 mg/dL   Patient has failed these meds in past:  Patient is currently uncontrolled on the following medications:  . Pravastatin 40  MG- Take 1 tablet daily  . Omega 3- Fatty Acids- Take 500 MG by mouth daily . Aspirin  81 MG- take daily  We discussed:     Diet and Exercise Extensively  Patient reports that Pre- COVID she went to the gym at least three times a week   Now she does not go to gym   She was told by her insurance that the there are options for her to go [facility]  She is walking 2 times per day and when she is watching TV sometimes she walks around the house  She has a physical in February and would like to see if her cholesterol has come down   Dr. Einar Gip is her cardiologist                Plan  Continue current medications. Patient will need more recent labs done, and patient   GERD    Patient has failed these meds in past:  Patient is currently controlled on the following medications:   Omeprazole 40 MG- take daily   We discussed:    Patient takes her medication daily   Plan  Continue current medications   Heart Failure   Type: Combined Systolic and Diastolic  Last ejection fraction: 2016 - 55% NYHA Class: I (no actitivty limitation) AHA HF Stage:   BP goal is:  <130/80 BP Readings from Last 3 Encounters:  02/07/20 134/70  12/05/19 116/74  10/05/19 112/76   Lab Results  Component Value Date   CREATININE 1.38 (H) 02/07/2020   BUN 27 02/07/2020   GFRNONAA 39 (L) 02/07/2020   GFRAA 45 (L) 02/07/2020   NA 137 02/07/2020   K 4.8 02/07/2020   CALCIUM 9.5 02/07/2020   CO2 22 02/07/2020    Patient checks BP at home twice daily Patient home BP readings are ranging: please above in HTN  Patient has failed these meds in past:  Patient is currently controlled on the following medications:  . Spironolactone 25 MG- take daily . Hydralazine 50 MG - take twice daily . Carvedilol 25 MG - take 1.5 tablets twice per day . Valsartan -HCTZ 160/25 - TAKE DAILY . Amlodipine 5 MG - take daily . Pravastatin 40 MG - take daily . Aspirin 81 MG - take daily  We discussed weighing daily; if you gain more than 3 pounds in one day or 5 pounds in one week call your doctor  Plan  Continue current medications   Chronic Kidney Disease Stage 3b   Patient has failed these meds in past: Patient is currently controlled on the following medications:  . Valsartan - Hydrochlorothiazide 160-25 MG - take daily . Spironolactone 25 MG - take daily . Hydralazine 50 MG - take twice daily  We will discuss in greater detail at next office visit.   Plan  Continue current medications, I have reviewed the patients mediation to ci  Vaccines   Reviewed and discussed patient's vaccination history.    Immunization History  Administered Date(s) Administered  . Fluad  Quad(high Dose 65+) 02/07/2020  . Hepatitis A 01/19/2012, 02/16/2012  . Hepatitis B 01/19/2012, 02/16/2012  . IPV 02/16/2012  . Influenza, High Dose Seasonal PF 02/17/2016, 03/04/2018, 02/10/2019  . Influenza-Unspecified 02/02/2014, 02/16/2014  . MMR 04/04/1994  . PFIZER SARS-COV-2 Vaccination 06/11/2019, 07/02/2019, 02/21/2020  . Td 09/08/2006  . Tdap 01/19/2012  . Zoster 12/15/2007   We Discussed:  Patient willingness to be vaccinated  Importance of Pneumonia Vaccine   Plan  Recommended patient receive Shingrix  vaccine in [place] office.   Medication Management   Patient's preferred pharmacy is:  [facility] [address], [place], [place] [address] Phone: [phone] Fax: [phone]  [id number] [place] [place] [address]Phone: [phone]: [phone] Uses pill box? Yes Pt endorses 100% compliance  We discussed: Discussed benefits of medication synchronization, packaging and delivery as well as enhanced pharmacist oversight with [facility].  Plan  Continue current medication management strategy    Follow up: 3 month phone visit  VOrlando Penner PharmD Clinical Pharmacist [facility] [phone]

## 2020-05-03 NOTE — Patient Instructions (Signed)
Visit Information  Goals Addressed            This Visit's Progress   . Pharmacy Care Plan       CARE PLAN ENTRY (see longitudinal plan of care for additional care plan information)  Current Barriers:  . Chronic Disease Management support, education, and care coordination needs related to Hypertension, Hyperlipidemia, Diabetes, and Heart Failure   Hypertension BP Readings from Last 3 Encounters:  10/05/19 112/76  09/24/19 129/77  09/01/19 128/76   . Pharmacist Clinical Goal(s): o Over the next 90 days, patient will work with PharmD and providers to maintain BP goal <130/80 . Current regimen:  . Amlodipine 5mg  daily . Valsartan/HCTZ 160/25mg  daily . Hydralazine 50mg  twice daily . Interventions: o Dietary and exercise recommendations provided - Limit salt intake - Increase physical activity to 30 minutes 3 times weekly, with a long term goal of 30 minutes 5 times weekly . Patient self care activities - Over the next 90 days, patient will: o Check BP twice per day, document, and provide at future appointments o Ensure daily salt intake < 2300 mg/day o Check BP daily to every other day and if symptomatic, document, and provide at future appointments o Try to exercise for 30 minutes 3 times per week  Hyperlipidemia Lab Results  Component Value Date/Time   LDLCALC 73 06/16/2019 09:23 AM   . Pharmacist Clinical Goal(s): o Over the next 90 days, patient will work with PharmD and providers to achieve LDL goal < 70 . Current regimen:  . Pravastatin 40mg  daily . Omega 3 fatty acids 500mg  daily . Aspirin 81 MG daily . Interventions: o Dietary and exercise recommendations provided - Increase intake of healthy fats (i.e avocados, walnuts, flaxseed) - Decrease intake of foods high in saturated and trans fat (i.e. chips) . Patient self care activities - Over the next 90 days, patient will: o Work to improve diet, increase heart healthy fats and decrease trans/saturated  fats  Diabetes Lab Results  Component Value Date/Time   HGBA1C 7.4 (H) 10/05/2019 10:14 AM   HGBA1C 7.7 (H) 06/16/2019 09:23 AM   . Pharmacist Clinical Goal(s): o Over the next 90 days, patient will work with PharmD and providers to achieve A1c goal <7% . Current regimen:  . Trulicity 1.5mg  weekly . Humalog mix 75/25 30-40 units daily before supper . Interventions: . Patient assistance approval for 2022 . Proper medication administration .  [name] Patient self care activities - Over the next 90 days, patient will: o Check blood sugar twice daily, document, and provide at future appointments o Contact provider with any episodes of hypoglycemia o Check blood sugar once daily, document, and provide at future appointments o Contact provider with any episodes of hypoglycemia o Try to exercise for 30 minutes daily 5 times per week  Heart Failure . Pharmacist Clinical Goal(s) o Over the next 90 days, patient will work with PharmD and providers to manage heart failure symptoms and keep a high quality of life . Current regimen:  . Spironolactone 50mg  daily, 25mg  daily  . Carvedilol 25mg  1.5 tablets twice daily . Valsartan - HCTZ 120/25 MG - take daily . Pravastatin 40 MG - take daily  . Aspirin 81 MG- take daily . Amlodipine 5 MG - take daily Interventions: o Weighing daily, if you gain more than 3 pounds in one day or 5 pounds in one week call your doctor . Patient self care activities - Over the next 90 days, patient will: o Limiting salt  in diet o Medication adherence  Medication management . Pharmacist Clinical Goal(s): o Over the next 90 days, patient will work with PharmD and providers to maintain optimal medication adherence . [facility] . Interventions o Comprehensive medication review performed. o Continue current medication management strategy . Patient self care activities - Over the next 90 days, patient will: o Focus on medication  adherence by keeping a routine schedule for taking medication o Take medications as prescribed o Report any questions or concerns to PharmD and/or provider(s)  Initial goal documentation        [name] was given information about Chronic Care Management services today including:  1. CCM service includes personalized support from designated clinical staff supervised by her physician, including individualized plan of care and coordination with other care providers 2. 24/7 contact phone numbers for assistance for urgent and routine care needs. 3. Standard insurance, coinsurance, copays and deductibles apply for chronic care management only during months in which we provide at least 20 minutes of these services. Most insurances cover these services at 100%, however patients may be responsible for any copay, coinsurance and/or deductible if applicable. This service may help you avoid the need for more expensive face-to-face services. 4. Only one practitioner may furnish and bill the service in a calendar month. 5. The patient may stop CCM services at any time (effective at the end of the month) by phone call to the office staff.  Patient agreed to services and verbal consent obtained.   The patient verbalized understanding of instructions, educational materials, and care plan provided today and declined offer to receive copy of patient instructions, educational materials, and care plan.  The pharmacy team will reach out to the patient again over the next 90 days.   [name], [facility]

## 2020-05-16 ENCOUNTER — Other Ambulatory Visit: Payer: Self-pay | Admitting: Internal Medicine

## 2020-05-18 ENCOUNTER — Encounter: Payer: Self-pay | Admitting: Internal Medicine

## 2020-05-24 ENCOUNTER — Telehealth: Payer: Self-pay

## 2020-05-24 ENCOUNTER — Telehealth: Payer: Self-pay | Admitting: *Deleted

## 2020-05-24 NOTE — Chronic Care Management (AMB) (Signed)
  Care Management   Note  05/24/2020 Name: Yvonne Miller MRN: [id number] DOB: Mar 19, 1951  Yvonne Miller is a 70 y.o. year old female who is a primary care patient of [name], MD and is actively engaged with the care management team. Yvonne Miller reached out to care guide by phone today asking to speak with Pharmacy [name] about medications. Sent message to [facility] to call patient.  [facility]

## 2020-05-24 NOTE — Chronic Care Management (AMB) (Signed)
Chronic Care Management Pharmacy Assistant   Name: Yvonne Miller  MRN: [id number] DOB: 03-07-1951  Reason for Encounter: Disease State- Diabetes Adherence Call   Patient Questions:  1.  Have you seen any other providers since your last visit? No   2.  Any changes in your medicines or health? No     PCP : [name], MD  Allergies:   Allergies  Allergen Reactions   Other Other (See Comments)    NO BLOOD   . Jehovah witness    Medications: Outpatient Encounter Medications as of 05/24/2020  Medication Sig   ACCU-CHEK AVIVA PLUS test strip CHECK 4 TIMES BY INTRADERMAL ROUTE EVERY DAY   albuterol (PROVENTIL HFA;VENTOLIN HFA) 108 (90 Base) MCG/ACT inhaler Inhale 2 puffs into the lungs every 4 (four) hours as needed for wheezing or shortness of breath.    amLODipine (NORVASC) 5 MG tablet TAKE 1 TABLET BY MOUTH  DAILY   aspirin EC 81 MG tablet Take 81 mg by mouth at bedtime.    B Complex Vitamins (B COMPLEX-B12) TABS Take 1 tablet by mouth daily.   carvedilol (COREG) 25 MG tablet TAKE 1 AND 1/2 TABLETS BY  MOUTH TWICE DAILY   Cholecalciferol (VITAMIN D) 50 MCG (2000 UT) tablet Take 2,000 Units by mouth daily.   Chromium 1000 MCG TABS Take 1,000 mcg by mouth daily.    Cinnamon 500 MG capsule Take 500 mg by mouth daily.   fluticasone (FLONASE) 50 MCG/ACT nasal spray Place 1 spray into both nostrils daily. (Patient taking differently: Place 1 spray into both nostrils as needed for allergies. )   hydrALAZINE (APRESOLINE) 50 MG tablet TAKE 1 TABLET BY MOUTH  TWICE DAILY   insulin lispro protamine-lispro (HUMALOG 75/25 MIX) (75-25) 100 UNIT/ML SUSP injection Inject 30-40 Units into the skin daily before supper. (Patient taking differently: Inject 45 Units into the skin daily before supper. Sliding scale)   nystatin (NYSTATIN) powder Apply topically 3 (three) times daily as needed. As needed (Patient taking differently: Apply 1 g topically 3 (three) times daily as needed  (yeast). )   Omega-3 Fatty Acids (FISH OIL) 500 MG CAPS Take 500 mg by mouth daily.   omeprazole (PRILOSEC) 40 MG capsule Take 40 mg by mouth daily.   pravastatin (PRAVACHOL) 40 MG tablet TAKE 1 TABLET BY MOUTH  DAILY   Probiotic Product (PROBIOTIC PO) Take by mouth.   ROCKLATAN 0.02-0.005 % SOLN SMARTSIG:1 Drop(s) In Eye(s) Every Evening   spironolactone (ALDACTONE) 25 MG tablet TAKE 1 TABLET BY MOUTH IN  THE MORNING   TRULICITY 1.5 TF/5.7DU SOPN INJECT 1.5 MG INTO THE SKIN ONCE A WEEK. (Patient taking differently: Inject 1.5 mg as directed every Wednesday. )   valsartan-hydrochlorothiazide (DIOVAN-HCT) 160-25 MG tablet TAKE 1 TABLET BY MOUTH  DAILY   No facility-administered encounter medications on file as of 05/24/2020.    Current Diagnosis: Patient Active Problem List   Diagnosis Date Noted   Non-ischemic cardiomyopathy ([place]) 02/18/2019   Encounter for assessment of implantable cardioverter-defibrillator (ICD)    Type 2 diabetes mellitus with stage 3 chronic kidney disease, with long-term current use of insulin ([place]) 03/04/2018   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   Chronic combined systolic and diastolic congestive heart failure ([place]) 03/04/2018   Chronic renal disease, stage II 03/04/2018   Status post revision of total replacement of left knee 01/26/2018   Unilateral primary osteoarthritis, left knee 08/18/2017   Biventricular automatic implantable cardioverter defibrillator in situ 12/13/2013  ICD -  Biventricular  [employer] Dynagen X4 CRT-D Model G158 ICD in place 10/28/13 10/28/2013   HTN (hypertension) 10/03/2013   Hyperlipemia 10/03/2013   LBBB (left bundle branch block) 10/03/2013   Cardiomyopathy ([place]) 10/03/2013   DM (diabetes mellitus) ([place]) 10/03/2013   Recent Relevant Labs: Lab Results  Component Value Date/Time   HGBA1C 7.3 (H) 02/07/2020 10:50 AM   HGBA1C 7.4 (H) 10/05/2019 10:14 AM   MICROALBUR 30 10/05/2019 12:07  PM   MICROALBUR 10 09/30/2018 03:26 PM    Kidney Function Lab Results  Component Value Date/Time   CREATININE 1.38 (H) 02/07/2020 10:50 AM   CREATININE 1.94 (H) 10/05/2019 10:14 AM   GFRNONAA 39 (L) 02/07/2020 10:50 AM   GFRAA 45 (L) 02/07/2020 10:50 AM    Current antihyperglycemic regimen:   Trulicity 1.5mg  weekly  Humalog mix 75/25 30-40 units daily before suppe   What recent interventions/DTPs have been made to improve glycemic control:  o Patient states she is taking her medications as directed by provider.   Have there been any recent hospitalizations or ED visits since last visit with CPP? No  How often are you checking your blood sugar? Three times a day  What are your blood sugars ranging? States they have been good , not able to give any reading , she was driving. o Fasting:  o Before meals:  o After meals:  o Bedtime:   During the week, how often does your blood glucose drop below 70? No   Adherence Review: Is the patient currently on a STATIN medication?  Is the patient currently on ACE/ARB medication?  Does the patient have >5 day gap between last estimated fill dates? No      Follow-Up:  Pharmacist Review - Called patient for diabetes adherence call. Per patient she is doing good.  Patient states she check blood sugars three times a day, she was driving so she could not talk long. States she is taking her medications.  Vallie Pearson,CPP Notified  Judithann Sheen, [facility] Clinical Pharmacist Assistant [phone]

## 2020-05-24 NOTE — Chronic Care Management (AMB) (Signed)
Chronic Care Management Pharmacy Assistant   Name: Yvonne Miller  MRN: [id number] DOB: 1950/07/14  Reason for Encounter: Patient Assistance Coordination   PCP : [name], MD  Allergies:   Allergies  Allergen Reactions   Other Other (See Comments)    NO BLOOD   . Jehovah witness    Medications: Outpatient Encounter Medications as of 05/24/2020  Medication Sig   ACCU-CHEK AVIVA PLUS test strip CHECK 4 TIMES BY INTRADERMAL ROUTE EVERY DAY   albuterol (PROVENTIL HFA;VENTOLIN HFA) 108 (90 Base) MCG/ACT inhaler Inhale 2 puffs into the lungs every 4 (four) hours as needed for wheezing or shortness of breath.    amLODipine (NORVASC) 5 MG tablet TAKE 1 TABLET BY MOUTH  DAILY   aspirin EC 81 MG tablet Take 81 mg by mouth at bedtime.    B Complex Vitamins (B COMPLEX-B12) TABS Take 1 tablet by mouth daily.   carvedilol (COREG) 25 MG tablet TAKE 1 AND 1/2 TABLETS BY  MOUTH TWICE DAILY   Cholecalciferol (VITAMIN D) 50 MCG (2000 UT) tablet Take 2,000 Units by mouth daily.   Chromium 1000 MCG TABS Take 1,000 mcg by mouth daily.    Cinnamon 500 MG capsule Take 500 mg by mouth daily.   fluticasone (FLONASE) 50 MCG/ACT nasal spray Place 1 spray into both nostrils daily. (Patient taking differently: Place 1 spray into both nostrils as needed for allergies. )   hydrALAZINE (APRESOLINE) 50 MG tablet TAKE 1 TABLET BY MOUTH  TWICE DAILY   insulin lispro protamine-lispro (HUMALOG 75/25 MIX) (75-25) 100 UNIT/ML SUSP injection Inject 30-40 Units into the skin daily before supper. (Patient taking differently: Inject 45 Units into the skin daily before supper. Sliding scale)   nystatin (NYSTATIN) powder Apply topically 3 (three) times daily as needed. As needed (Patient taking differently: Apply 1 g topically 3 (three) times daily as needed (yeast). )   Omega-3 Fatty Acids (FISH OIL) 500 MG CAPS Take 500 mg by mouth daily.   omeprazole (PRILOSEC) 40 MG capsule Take 40 mg by mouth  daily.   pravastatin (PRAVACHOL) 40 MG tablet TAKE 1 TABLET BY MOUTH  DAILY   Probiotic Product (PROBIOTIC PO) Take by mouth.   ROCKLATAN 0.02-0.005 % SOLN SMARTSIG:1 Drop(s) In Eye(s) Every Evening   spironolactone (ALDACTONE) 25 MG tablet TAKE 1 TABLET BY MOUTH IN  THE MORNING   TRULICITY 1.5 GD/9.2EQ SOPN INJECT 1.5 MG INTO THE SKIN ONCE A WEEK. (Patient taking differently: Inject 1.5 mg as directed every Wednesday. )   valsartan-hydrochlorothiazide (DIOVAN-HCT) 160-25 MG tablet TAKE 1 TABLET BY MOUTH  DAILY   No facility-administered encounter medications on file as of 05/24/2020.    Current Diagnosis: Patient Active Problem List   Diagnosis Date Noted   Non-ischemic cardiomyopathy ([place]) 02/18/2019   Encounter for assessment of implantable cardioverter-defibrillator (ICD)    Type 2 diabetes mellitus with stage 3 chronic kidney disease, with long-term current use of insulin ([place]) 03/04/2018   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   Chronic combined systolic and diastolic congestive heart failure ([place]) 03/04/2018   Chronic renal disease, stage II 03/04/2018   Status post revision of total replacement of left knee 01/26/2018   Unilateral primary osteoarthritis, left knee 08/18/2017   Biventricular automatic implantable cardioverter defibrillator in situ 12/13/2013   ICD -  Biventricular  [employer] Dynagen X4 CRT-D Model G158 ICD in place 10/28/13 10/28/2013   HTN (hypertension) 10/03/2013   Hyperlipemia 10/03/2013   LBBB (left bundle branch block)  10/03/2013   Cardiomyopathy ([place]) 10/03/2013   DM (diabetes mellitus) ([place]) 10/03/2013     Follow-Up:  Patient Assistance Coordination-Called and made the patient aware income documentation is needed to complete and process Scientist, forensic for [employer]. Patient stated she has already submitted this before and spoke with [name], CPP back in December 2021 regarding this when the patient  was informed income documentation was requested. Apologized to the patient for any misunderstanding and any inconvenience this may have caused however I kindly asked the patient if she will be able to provide the supporting income documentation requested on order to process her application so she can continue to receive her Trulicity medication for the year. Patient stated she doesn't see why she has to come into the office to give this information when it is easier for her to fax. Reassured the patient that it shouldn't be a problem. I was able to provide Dr.Sanders office fax number [phone] to the patient and advised to attention to [name], CPP. Patient stated she will send on Monday 1/24.  Notified [name], CPP.  Raynelle Highland, [place] Pharmacist Assistant [phone]

## 2020-06-04 DIAGNOSIS — H401131 Primary open-angle glaucoma, bilateral, mild stage: Secondary | ICD-10-CM | POA: Diagnosis not present

## 2020-06-05 ENCOUNTER — Telehealth: Payer: Self-pay

## 2020-06-05 NOTE — Chronic Care Management (AMB) (Addendum)
06/05/2020- CPP was notifed by patient that [employer] has not received her application for Trulicity or Humalog.  Called [employer] to follow up on patient patient assistance medication for Trulicity and Humalog, per representative from [facility], they did not see an application on file that was sent on 05/28/2020, they only see her prescriptions have expired on 05/04/2020. Was instructed to call the number back and choose option to [employer]. Called back and spoke with Daneil Dan, application was found and they were doing a rush process to the pharmacy for patient. Patient called to inform of status of medications, no answer, left message to return call.   06/06/2020- Patient called back, she is aware that the application from 01/31/2445 was found and they are proceeding with a rush processing and delivery to the pharmacy. Patient was thankful for the help.  06/19/2020- Patient called again, leaving a message with the office that the prescriptions were not processed because the prescription portion of the application date was missing. I updated dates on prescriptions for Trulicity and Humalog and refaxed to [employer].   06/20/2020- Called [employer] to see if they received the correct application with prescription dates, spoke with representative they have received the fax. She informed me that processing is taking up to 5-7 days and patient should hear something then. Called patient to update on application also to inform samples avaialble at her PCP office to pick up until patient assistance medication is received. No answer, left message to return call.   [name], CPP notified.  [name], [place] Pharmacist Assistant [phone]  I have reviewed the care management and care coordination activities outlined in this encounter and I am certifying that I agree with the content of this note. No further action required.  1 minutes spent in review, coordination, and  documentation.  [name], [facility] 06/30/20 3:22 PM

## 2020-06-19 ENCOUNTER — Encounter: Payer: Self-pay | Admitting: Internal Medicine

## 2020-06-20 NOTE — Telephone Encounter (Cosign Needed)
Called patient today, she did not answer, but left message of application status, [employer] has updated and completed forms. Also informed of Samples available per [facility]

## 2020-06-21 ENCOUNTER — Other Ambulatory Visit: Payer: Self-pay

## 2020-06-21 ENCOUNTER — Encounter: Payer: Self-pay | Admitting: Internal Medicine

## 2020-06-21 ENCOUNTER — Ambulatory Visit (INDEPENDENT_AMBULATORY_CARE_PROVIDER_SITE_OTHER): Payer: Medicare Other

## 2020-06-21 ENCOUNTER — Ambulatory Visit (INDEPENDENT_AMBULATORY_CARE_PROVIDER_SITE_OTHER): Payer: Medicare Other | Admitting: Internal Medicine

## 2020-06-21 VITALS — BP 118/64 | HR 77 | Temp 97.4°F | Ht 63.0 in | Wt 276.0 lb

## 2020-06-21 VITALS — BP 118/64 | HR 77 | Temp 97.4°F | Ht 63.0 in | Wt 276.6 lb

## 2020-06-21 DIAGNOSIS — Z23 Encounter for immunization: Secondary | ICD-10-CM | POA: Diagnosis not present

## 2020-06-21 DIAGNOSIS — S91332A Puncture wound without foreign body, left foot, initial encounter: Secondary | ICD-10-CM | POA: Diagnosis not present

## 2020-06-21 DIAGNOSIS — Z0000 Encounter for general adult medical examination without abnormal findings: Secondary | ICD-10-CM | POA: Diagnosis not present

## 2020-06-21 DIAGNOSIS — N183 Chronic kidney disease, stage 3 unspecified: Secondary | ICD-10-CM | POA: Diagnosis not present

## 2020-06-21 DIAGNOSIS — Z794 Long term (current) use of insulin: Secondary | ICD-10-CM

## 2020-06-21 DIAGNOSIS — E1122 Type 2 diabetes mellitus with diabetic chronic kidney disease: Secondary | ICD-10-CM

## 2020-06-21 DIAGNOSIS — Z6841 Body Mass Index (BMI) 40.0 and over, adult: Secondary | ICD-10-CM

## 2020-06-21 DIAGNOSIS — I130 Hypertensive heart and chronic kidney disease with heart failure and stage 1 through stage 4 chronic kidney disease, or unspecified chronic kidney disease: Secondary | ICD-10-CM | POA: Diagnosis not present

## 2020-06-21 DIAGNOSIS — E7800 Pure hypercholesterolemia, unspecified: Secondary | ICD-10-CM

## 2020-06-21 MED ORDER — NYSTATIN 100000 UNIT/GM EX POWD: Freq: Three times a day (TID) | CUTANEOUS | 2 refills | Status: AC | PRN

## 2020-06-21 NOTE — Progress Notes (Signed)
This visit occurred during the SARS-CoV-2 public health emergency.  Safety protocols were in place, including screening questions prior to the visit, additional usage of staff PPE, and extensive cleaning of exam room while observing appropriate contact time as indicated for disinfecting solutions.  Subjective:   Yvonne Miller is a 70 y.o. female who presents for Medicare Annual (Subsequent) preventive examination.  Review of Systems     Cardiac Risk Factors include: advanced age (>77men, >63 women);diabetes mellitus;dyslipidemia;obesity (BMI >30kg/m2)     Objective:    Today's Vitals   06/21/20 0915  BP: 118/64  Pulse: 77  Temp: (!) 97.4 F (36.3 C)  TempSrc: Oral  Weight: 276 lb (125.2 kg)  Height: $Remove'5\' 3"'wexOjIl$  (1.6 m)   Body mass index is 48.89 kg/m.  Advanced Directives 06/21/2020 09/24/2019 06/16/2019 03/01/2019 09/30/2018 03/19/2018 03/12/2018  Does Patient Have a Medical Advance Directive? Yes Yes Yes - Yes Yes Yes  Type of Advance Directive [place];Living will [place];Living will [place];Living will [place];Living will [place];Living will Healthcare Power of [place]  Does patient want to make changes to medical advance directive? - - - - - - No - Patient declined  Copy of [place] in Chart? Yes - validated most recent copy scanned in chart (See row information) - Yes - validated most recent copy scanned in chart (See row information) Yes - validated most recent copy scanned in chart (See row information) Yes - validated most recent copy scanned in chart (See row information) Yes - validated most recent copy scanned in chart (See row information) Yes - validated most recent copy scanned in chart (See row information)  Would patient like information on creating a medical advance directive? - - - - - - -    Current Medications  (verified) Outpatient Encounter Medications as of 06/21/2020  Medication Sig  . ACCU-CHEK AVIVA PLUS test strip CHECK 4 TIMES BY INTRADERMAL ROUTE EVERY DAY  . albuterol (PROVENTIL HFA;VENTOLIN HFA) 108 (90 Base) MCG/ACT inhaler Inhale 2 puffs into the lungs every 4 (four) hours as needed for wheezing or shortness of breath.   [name] amLODipine (NORVASC) 5 MG tablet TAKE 1 TABLET BY MOUTH  DAILY  . aspirin EC 81 MG tablet Take 81 mg by mouth at bedtime.   . B Complex Vitamins (B COMPLEX-B12) TABS Take 1 tablet by mouth daily.  . carvedilol (COREG) 25 MG tablet TAKE 1 AND 1/2 TABLETS BY  MOUTH TWICE DAILY  . Cholecalciferol (VITAMIN D) 50 MCG (2000 UT) tablet Take 2,000 Units by mouth daily.  . Chromium 1000 MCG TABS Take 1,000 mcg by mouth daily.   . Cinnamon 500 MG capsule Take 500 mg by mouth daily.  . fluticasone (FLONASE) 50 MCG/ACT nasal spray Place 1 spray into both nostrils daily. (Patient taking differently: Place 1 spray into both nostrils as needed for allergies.)  . hydrALAZINE (APRESOLINE) 50 MG tablet TAKE 1 TABLET BY MOUTH  TWICE DAILY  . insulin lispro protamine-lispro (HUMALOG 75/25 MIX) (75-25) 100 UNIT/ML SUSP injection Inject 30-40 Units into the skin daily before supper. (Patient taking differently: Inject 45 Units into the skin daily before supper. Sliding scale)  . Omega-3 Fatty Acids (FISH OIL) 500 MG CAPS Take 500 mg by mouth daily.  [name] omeprazole (PRILOSEC) 40 MG capsule Take 40 mg by mouth daily.  . pravastatin (PRAVACHOL) 40 MG tablet TAKE 1 TABLET BY MOUTH  DAILY  . Probiotic Product (PROBIOTIC PO) Take by mouth.  [name] ROCKLATAN 0.02-0.005 % SOLN SMARTSIG:1 Drop(s) In Eye(s) Every Evening  . spironolactone (ALDACTONE) 25 MG tablet TAKE 1 TABLET BY MOUTH IN  THE MORNING  . TRULICITY 1.5 YQ/6.5HQ SOPN INJECT 1.5 MG INTO THE SKIN ONCE A WEEK. (Patient taking differently: Inject 1.5 mg as directed every Wednesday.)  . valsartan-hydrochlorothiazide (DIOVAN-HCT) 160-25 MG tablet  TAKE 1 TABLET BY MOUTH  DAILY  . [DISCONTINUED] nystatin (NYSTATIN) powder Apply topically 3 (three) times daily as needed. As needed (Patient taking differently: Apply 1 g topically 3 (three) times daily as needed (yeast).)   No facility-administered encounter medications on file as of 06/21/2020.    Allergies (verified) Other   History: Past Medical History:  Diagnosis Date  . AICD (automatic cardioverter/defibrillator) present   . Arthritis   . Asthma   . Carpal tunnel syndrome, bilateral   . CKD (chronic kidney disease)   . Diabetes mellitus   . Encounter for assessment of implantable cardioverter-defibrillator (ICD)   . GERD (gastroesophageal reflux disease)   . Hyperlipemia   . Hypertension   . Obesity    morbid  . Sleep apnea    wears CPAP set at 12  . Wears glasses    Past Surgical History:  Procedure Laterality Date  . ABDOMINAL HYSTERECTOMY    . BIOPSY  03/01/2019   Procedure: BIOPSY;  Surgeon: Juanita Craver, MD;  Location: [facility];  Service: Endoscopy;;  . BREAST SURGERY  1968   breast mass excision  . CARPAL TUNNEL RELEASE Left 07/10/2017   Procedure: CARPAL TUNNEL RELEASE;  Surgeon: Ashok Pall, MD;  Location: [facility];  Service: Neurosurgery;  Laterality: Left;  left  . CARPAL TUNNEL RELEASE Right 03/19/2018   Procedure: RIGHT CARPAL TUNNEL RELEASE;  Surgeon: Ashok Pall, MD;  Location: [facility];  Service: Neurosurgery;  Laterality: Right;  right  . CERVICAL SPINE SURGERY  2006  . CHOLECYSTECTOMY    . COLONOSCOPY WITH PROPOFOL N/A 03/01/2019   Procedure: COLONOSCOPY WITH PROPOFOL;  Surgeon: Juanita Craver, MD;  Location: [facility];  Service: Endoscopy;  Laterality: N/A;  . DILATION AND CURETTAGE OF UTERUS    . ICD IMPLANT    . JOINT REPLACEMENT    . TONSILLECTOMY  1961  . TOTAL KNEE ARTHROPLASTY Left 01/26/2018   Procedure: LEFT TOTAL KNEE ARTHROPLASTY;  Surgeon: Mcarthur Rossetti, MD;  Location: [facility];  Service: Orthopedics;  Laterality: Left;    Family History  Problem Relation Age of Onset  . Hypertension Mother   . Stroke Mother   . Heart disease Father   . Heart attack Sister   . Heart attack Brother    Social History   Socioeconomic History  . Marital status: Married    Spouse name: Not on file  . Number of children: 2  . Years of education: Not on file  . Highest education level: Not on file  Occupational History  . Occupation: retired  Tobacco Use  . Smoking status: Never Smoker  . Smokeless tobacco: Never Used  Vaping Use  . Vaping Use: Never used  Substance and Sexual Activity  . Alcohol use: No  . Drug use: No  . Sexual activity: Yes  Other Topics Concern  . Not on file  Social History Narrative  . Not on file   Social Determinants of Health   Financial Resource Strain: Low Risk   . Difficulty of Paying Living Expenses: Not hard at all  Food Insecurity:  No Food Insecurity  . Worried About Charity fundraiser in the Last Year: Never true  . Ran Out of Food in the Last Year: Never true  Transportation Needs: No Transportation Needs  . Lack of Transportation (Medical): No  . Lack of Transportation (Non-Medical): No  Physical Activity: Insufficiently Active  . Days of Exercise per Week: 3 days  . Minutes of Exercise per Session: 30 min  Stress: No Stress Concern Present  . Feeling of Stress : Not at all  Social Connections: Not on file    Tobacco Counseling Counseling given: Not Answered   Clinical Intake:  Pre-visit preparation completed: Yes  Pain : No/denies pain     Nutritional Status: BMI > 30  Obese Nutritional Risks: None Diabetes: Yes  How often do you need to have someone help you when you read instructions, pamphlets, or other written materials from your doctor or pharmacy?: 1 - Never What is the last grade level you completed in school?: BA  Diabetic? Yes Nutrition Risk Assessment:  Has the patient had any N/V/D within the last 2 months?  No  Does the patient have  any non-healing wounds?  No  Has the patient had any unintentional weight loss or weight gain?  Yes   Diabetes:  Is the patient diabetic?  Yes  If diabetic, was a CBG obtained today?  No  Did the patient bring in their glucometer from home?  No  How often do you monitor your CBG's? Four times daily.   Financial Strains and Diabetes Management:  Are you having any financial strains with the device, your supplies or your medication? No .  Does the patient want to be seen by Chronic Care Management for management of their diabetes?  No  Would the patient like to be referred to a Nutritionist or for Diabetic Management?  No   Diabetic Exams:  Diabetic Eye Exam: Completed 12/02/2019 Diabetic Foot Exam: Completed 10/05/2019   Interpreter Needed?: No  Information entered by :: NAllen LPN   Activities of Daily Living In your present state of health, do you have any difficulty performing the following activities: 06/21/2020 06/21/2020  Hearing? N N  Vision? N N  Difficulty concentrating or making decisions? N N  Walking or climbing stairs? N N  Dressing or bathing? N N  Doing errands, shopping? N N  Preparing Food and eating ? N -  Using the Toilet? N -  In the past six months, have you accidently leaked urine? N -  Do you have problems with loss of bowel control? N -  Managing your Medications? N -  Managing your Finances? N -  Housekeeping or managing your Housekeeping? N -  Some recent data might be hidden    Patient Care Team: [name], MD as PCP - General (Internal Medicine) Rex Kras, Claudette Stapler, RN as Case Manager [name], [facility] (Pharmacist)  Indicate any recent Medical Services you may have received from other than [facility] providers in the past year (date may be approximate).     Assessment:   This is a routine wellness examination for [place].  Hearing/Vision screen  Hearing Screening   '125Hz'$  $Remo'250Hz'FeVsb$'500Hz'$'1000Hz'$'2000Hz'$'3000Hz'$'4000Hz'$'6000Hz'$'8000Hz'$   Right ear:            Left ear:           Vision Screening Comments: Regular eye exams, Dr. Idolina Primer  Dietary issues and exercise activities discussed: Current Exercise Habits: Home exercise routine, Type  of exercise: walking, Time (Minutes): 30, Frequency (Times/Week): 3, Weekly Exercise (Minutes/Week): 90  Goals    .  "I want to improve my kidney function" (pt-stated)      CARE PLAN ENTRY (see longtitudinal plan of care for additional care plan information)  Current Barriers:  [name] Knowledge Deficits related to disease process and Self Health management of stage III chronic kidney disease . Chronic Disease Management support and education needs related to T2DM, HTN, HLD, CKD, CHF  Nurse Case Manager Clinical Goal(s):  . New 03/20/20 Over the next 90 days, patient will work with CCM RN CM and PCP to address needs related to disease education and support for improved Self Health management of stage III CKD  CCM RN CM Interventions:  03/20/20 call completed with patient  . Evaluation of current treatment plan related to stage III chronic kidney disease and patient's adherence to plan as established by provider. . Provided education to patient re: current GFR 45; Re-educated patient on stages of CKD and how to improve with appropriate Self Health management, increasing water intake to improve renal function, maintain healthy weight, exercise and maintaining health diet, keep BP and DM well controlled . Discussed plans with patient for ongoing care management follow up and provided patient with direct contact information for care management team . Provided patient with printed educational materials related to Eating Right for Chronic Kidney Disease; Salt Substitutes for Kidney patients  Patient Self Care Activities:  . Self administers medications as prescribed . Attends all scheduled provider appointments . Calls pharmacy for medication refills . Calls provider office for new concerns or questions  Please  see past updates related to this goal by clicking on the "Past Updates" button in the selected goal      .  "I would like to keep my BP under good control" (pt-stated)      CARE PLAN ENTRY (see longtitudinal plan of care for additional care plan information)  Current Barriers:  [name] Knowledge Deficits related to disease process and Self Health management of Hypertension . Chronic Disease Management support and education needs related to T2DM, HTN, HLD, CKD, CHF  Nurse Case Manager Clinical Goal(s):  . New 03/20/20 Over the next 90 days, patient will work with the CCM team and PCP to address needs related to disease education and support to improve Self Health management of Hypertension    CCM RN CM Interventions:  03/20/20 call completed with patient  . Evaluation of current treatment plan related to Hypertension and patient's adherence to plan as established by provider. . Provided education to patient re:  recent in office BP 134/70; Re-educated on target BP <130/80; Determined patient has met her target BP and is being managed by Dr. Einar Gip for HTN/CHF with next scheduled f/u set for 08/31/20  . Reviewed medications with patient and discussed indication, dosage and frequency of prescribed mediations and importance of patient taking her medications exactly as prescribed w/o missed doses; patient reports adherence, denies financial hardship; Current regimen:  o amLODipine (NORVASC) 5 MG tablet, TAKE 1 TABLET BY MOUTH  DAILY o carvedilol (COREG) 25 MG tablet, TAKE 1 AND 1/2 TABLETS BY  MOUTH TWICE DAILY o hydrALAZINE (APRESOLINE) 50 MG tablet, TAKE 1 TABLET BY MOUTH  TWICE DAILY o spironolactone (ALDACTONE) 25 MG tablet, TAKE 1 TABLET BY MOUTH IN  THE MORNING o valsartan-hydrochlorothiazide (DIOVAN-HCT) 160-25 MG tablet, TAKE 1 TABLET BY MOUTH  DAILY . Mailed printed educational material related to, "Why Should I Lower Sodium?"; "Life's  Simple 7" . Discussed plans with patient for ongoing care  management follow up and provided patient with direct contact information for care management team  Patient Self Care Activities:  . Self administers medications as prescribed . Attends all scheduled provider appointments . Calls pharmacy for medication refills . Calls provider office for new concerns or questions  Please see past updates related to this goal by clicking on the "Past Updates" button in the selected goal      .  "I would like to lower my A1c" (pt-stated)      CARE PLAN ENTRY (see longtitudinal plan of care for additional care plan information)  Current Barriers:  [name] Knowledge Deficits related to disease process and Self Health management of type 2 DM . Chronic Disease Management support and education needs related to T2DM, HTN, HLD, CKD, CHF  Nurse Case Manager Clinical Goal(s):  . New 03/20/20 Over the next 90 days, patient will work with CCM RN CM and PCP to address needs related to disease education and support for improved Self Health management of DM and lower A1c  CCM RN CM Interventions:  03/20/20 call completed with patient  . Evaluation of current treatment plan related to Diabetes and patient's adherence to plan as established by provider. . Provided education to patient re: current A1c is down to 7.3, Re-educated on target A1c <7.0;  Re-educated on daily glycemic control, FBS 80-130, <180 after meals; Re-educated on dietary and exercise recommendations . Reviewed medications with patient and discussed indication dosage and frequency of prescribed medications; patient reports adherence , denies noted SE; Current regimen:  o insulin lispro protamine-lispro (HUMALOG 75/25 MIX) (75-25) 100 UNIT/ML SUSP injection, Inject 30-40 Units into the skin daily before supper.   (Patient taking differently: Inject 45 Units into the skin daily before supper. Sliding scale) o TRULICITY 1.5 ZO/1.0RU SOPN, INJECT 1.5 MG INTO THE SKIN ONCE A WEEK. (Patient taking differently: Inject  1.5 mg as directed every Wednesday.) . Provided patient with printed educational materials related to Diabetes Care Schedule; Grocery Shopping with Diabetes; Preventing Complications from Diabetes . Advised patient, providing education and rationale, to check cbg 1-2 times daily before meals and record, calling RN CM and or PCP for findings outside established parameters . Discussed plans with patient for ongoing care management follow up and provided patient with direct contact information for care management team  Patient Self Care Activities:  . Self administers medications as prescribed . Attends all scheduled provider appointments . Calls pharmacy for medication refills . Calls provider office for new concerns or questions  Please see past updates related to this goal by clicking on the "Past Updates" button in the selected goal      .  Patient Stated      06/21/2020, wants to lose 100 pounds    .  Pharmacy Care Plan      CARE PLAN ENTRY (see longitudinal plan of care for additional care plan information)  Current Barriers:  . Chronic Disease Management support, education, and care coordination needs related to Hypertension, Hyperlipidemia, Diabetes, and Heart Failure   Hypertension BP Readings from Last 3 Encounters:  10/05/19 112/76  09/24/19 129/77  09/01/19 128/76   . Pharmacist Clinical Goal(s): o Over the next 90 days, patient will work with PharmD and providers to maintain BP goal <130/80 . Current regimen:  . Amlodipine 5mg  daily . Valsartan/HCTZ 160/25mg  daily . Hydralazine 50mg  twice daily . Interventions: o Dietary and exercise recommendations provided - Limit salt intake -  Increase physical activity to 30 minutes 3 times weekly, with a long term goal of 30 minutes 5 times weekly . Patient self care activities - Over the next 90 days, patient will: o Check BP twice per day, document, and provide at future appointments o Ensure daily salt intake < 2300  mg/day o Check BP daily to every other day and if symptomatic, document, and provide at future appointments o Try to exercise for 30 minutes 3 times per week  Hyperlipidemia Lab Results  Component Value Date/Time   LDLCALC 73 06/16/2019 09:23 AM   . Pharmacist Clinical Goal(s): o Over the next 90 days, patient will work with PharmD and providers to achieve LDL goal < 70 . Current regimen:  . Pravastatin $RemoveBefor'40mg'RgGntChPCWms$  daily . Omega 3 fatty acids $RemoveBef'500mg'sFNIdWHYqC$  daily . Aspirin 81 MG daily . Interventions: o Dietary and exercise recommendations provided - Increase intake of healthy fats (i.e avocados, walnuts, flaxseed) - Decrease intake of foods high in saturated and trans fat (i.e. chips) . Patient self care activities - Over the next 90 days, patient will: o Work to improve diet, increase heart healthy fats and decrease trans/saturated fats  Diabetes Lab Results  Component Value Date/Time   HGBA1C 7.4 (H) 10/05/2019 10:14 AM   HGBA1C 7.7 (H) 06/16/2019 09:23 AM   . Pharmacist Clinical Goal(s): o Over the next 90 days, patient will work with PharmD and providers to achieve A1c goal <7% . Current regimen:  . Trulicity 1.$RemoveBefor'5mg'nKdagtSaIRUq$  weekly . Humalog mix 75/25 30-40 units daily before supper . Interventions: . Patient assistance approval for 2022 . Proper medication administration .  [name] Patient self care activities - Over the next 90 days, patient will: o Check blood sugar twice daily, document, and provide at future appointments o Contact provider with any episodes of hypoglycemia o Check blood sugar once daily, document, and provide at future appointments o Contact provider with any episodes of hypoglycemia o Try to exercise for 30 minutes daily 5 times per week  Heart Failure . Pharmacist Clinical Goal(s) o Over the next 90 days, patient will work with PharmD and providers to manage heart failure symptoms and keep a high quality of life . Current regimen:  . Spironolactone $RemoveBeforeDE'50mg'ajXJKawgdBpXapj$  daily, $Remov'25mg'RBpZNR$  daily   . Carvedilol $RemoveBefo'25mg'BRlDSltlbid$  1.5 tablets twice daily . Valsartan - HCTZ 120/25 MG - take daily . Pravastatin 40 MG - take daily  . Aspirin 81 MG- take daily . Amlodipine 5 MG - take daily Interventions: o Weighing daily, if you gain more than 3 pounds in one day or 5 pounds in one week call your doctor . Patient self care activities - Over the next 90 days, patient will: o Limiting salt in diet o Medication adherence  Medication management . Pharmacist Clinical Goal(s): o Over the next 90 days, patient will work with PharmD and providers to maintain optimal medication adherence . Current pharmacy: [facility] . Interventions o Comprehensive medication review performed. o Continue current medication management strategy . Patient self care activities - Over the next 90 days, patient will: o Focus on medication adherence by keeping a routine schedule for taking medication o Take medications as prescribed o Report any questions or concerns to PharmD and/or provider(s)  Initial goal documentation     .  Weight (lb) < 200 lb (90.7 kg)    .  Weight (lb) < 200 lb (90.7 kg)      06/16/2019, wants to lose 13 pounds by stop eating at night  Depression Screen PHQ 2/9 Scores 06/21/2020 10/05/2019 06/16/2019 02/10/2019 09/30/2018 05/31/2018 04/14/2018  PHQ - 2 Score 0 0 0 0 0 0 0  PHQ- 9 Score - - 0 - 0 - -    Fall Risk Fall Risk  06/21/2020 10/05/2019 06/16/2019 02/10/2019 09/30/2018  Falls in the past year? 0 1 0 0 0  Number falls in past yr: - 0 - - -  Injury with Fall? - 1 - - -  Comment - hurt right arm - - -  Risk for fall due to : Medication side effect - Medication side effect - Medication side effect  Follow up Falls evaluation completed;Education provided;Falls prevention discussed - - - Falls prevention discussed;Education provided    FALL RISK PREVENTION PERTAINING TO THE HOME:  Any stairs in or around the home? Yes  If so, are there any without handrails? No  Home free  of loose throw rugs in walkways, pet beds, electrical cords, etc? Yes  Adequate lighting in your home to reduce risk of falls? Yes   ASSISTIVE DEVICES UTILIZED TO PREVENT FALLS:  Life alert? No  Use of a cane, walker or w/c? No  Grab bars in the bathroom? No  Shower chair or bench in shower? No  Elevated toilet seat or a handicapped toilet? No   TIMED UP AND GO:  Was the test performed? No .   Gait slow and steady without use of assistive device  Cognitive Function:     6CIT Screen 06/21/2020 06/16/2019 09/30/2018  What Year? 0 points 0 points 0 points  What month? 0 points 0 points 0 points  What time? 0 points 0 points 0 points  Count back from 20 0 points 0 points 0 points  Months in reverse 0 points 0 points 0 points  Repeat phrase 4 points 0 points 0 points  Total Score 4 0 0    Immunizations Immunization History  Administered Date(s) Administered  . Fluad Quad(high Dose 65+) 02/07/2020  . Hepatitis A 01/19/2012, 02/16/2012  . Hepatitis B 01/19/2012, 02/16/2012  . IPV 02/16/2012  . Influenza, High Dose Seasonal PF 02/17/2016, 03/04/2018, 02/10/2019  . Influenza-Unspecified 02/02/2014, 02/16/2014  . MMR 04/04/1994  . PFIZER(Purple Top)SARS-COV-2 Vaccination 06/11/2019, 07/02/2019, 02/21/2020  . Pneumococcal Conjugate-13 05/16/2020  . Td 09/08/2006  . Tdap 01/19/2012  . Zoster 12/15/2007    TDAP status: Completed at today's visit  Flu Vaccine status: Up to date  Pneumococcal vaccine status: Up to date  Covid-19 vaccine status: Completed vaccines  Qualifies for Shingles Vaccine? Yes   Zostavax completed Yes   Shingrix Completed?: No.    Education has been provided regarding the importance of this vaccine. Patient has been advised to call insurance company to determine out of pocket expense if they have not yet received this vaccine. Advised may also receive vaccine at local pharmacy or Health Dept. Verbalized acceptance and understanding.  Screening  Tests Health Maintenance  Topic Date Due  . HEMOGLOBIN A1C  08/07/2020  . FOOT EXAM  10/04/2020  . OPHTHALMOLOGY EXAM  12/01/2020  . PNA vac Low Risk Adult (2 of 2 - PPSV23) 05/16/2021  . MAMMOGRAM  12/21/2021  . TETANUS/TDAP  01/18/2022  . COLONOSCOPY (Pts 45-91yrs Insurance coverage will need to be confirmed)  02/28/2029  . INFLUENZA VACCINE  Completed  . DEXA SCAN  Completed  . COVID-19 Vaccine  Completed  . Hepatitis C Screening  Completed    Health Maintenance  There are no preventive care reminders to display for this  patient.  Colorectal cancer screening: No longer required.   Mammogram status: Completed 12/22/2019. Repeat every year  Bone Density status: Completed 09/17/2017. Results reflect: Bone density results: NORMAL. Repeat every 5 years.  Lung Cancer Screening: (Low Dose CT Chest recommended if Age 70-80 years, 30 pack-year currently smoking OR have quit w/in 15years.) does not qualify.   Lung Cancer Screening Referral: no  Additional Screening:  Hepatitis C Screening: does qualify; Completed 05/28/2012  Vision Screening: Recommended annual ophthalmology exams for early detection of glaucoma and other disorders of the eye. Is the patient up to date with their annual eye exam?  Yes  Who is the provider or what is the name of the office in which the patient attends annual eye exams? Dr. Idolina Primer If pt is not established with a provider, would they like to be referred to a provider to establish care? No .   Dental Screening: Recommended annual dental exams for proper oral hygiene  Community Resource Referral / Chronic Care Management: CRR required this visit?  No   CCM required this visit?  No      Plan:     I have personally reviewed and noted the following in the patient's chart:   . Medical and social history . Use of alcohol, tobacco or illicit drugs  . Current medications and supplements . Functional ability and status . Nutritional status . Physical  activity . Advanced directives . List of other physicians . Hospitalizations, surgeries, and ER visits in previous 12 months . Vitals . Screenings to include cognitive, depression, and falls . Referrals and appointments  In addition, I have reviewed and discussed with patient certain preventive protocols, quality metrics, and best practice recommendations. A written personalized care plan for preventive services as well as general preventive health recommendations were provided to patient.     Kellie Simmering, LPN   9/80/2217   Nurse Notes:

## 2020-06-21 NOTE — Patient Instructions (Signed)
Yvonne Miller , Thank you for taking time to come for your Medicare Wellness Visit. I appreciate your ongoing commitment to your health goals. Please review the following plan we discussed and let me know if I can assist you in the future.   Screening recommendations/referrals: Colonoscopy: not required Mammogram: completed 12/22/2019 Bone Density: completed 09/17/2017 Recommended yearly ophthalmology/optometry visit for glaucoma screening and checkup Recommended yearly dental visit for hygiene and checkup  Vaccinations: Influenza vaccine: completed 02/07/2020, due 12/03/2020 Pneumococcal vaccine: completed 05/16/2020 Tdap vaccine: today Shingles vaccine: discussed   Covid-19: 02/21/2020, 07/02/2019, 06/11/2019  Advanced directives: Please bring a copy of your POA (Power of Attorney) and/or Living Will to your next appointment.   Conditions/risks identified: none  Next appointment: Follow up in one year for your annual wellness visit    Preventive Care 65 Years and Older, Female Preventive care refers to lifestyle choices and visits with your health care provider that can promote health and wellness. What does preventive care include?  A yearly physical exam. This is also called an annual well check.  Dental exams once or twice a year.  Routine eye exams. Ask your health care provider how often you should have your eyes checked.  Personal lifestyle choices, including:  Daily care of your teeth and gums.  Regular physical activity.  Eating a healthy diet.  Avoiding tobacco and drug use.  Limiting alcohol use.  Practicing safe sex.  Taking low-dose aspirin every day.  Taking vitamin and mineral supplements as recommended by your health care provider. What happens during an annual well check? The services and screenings done by your health care provider during your annual well check will depend on your age, overall health, lifestyle risk factors, and family history of  disease. Counseling  Your health care provider may ask you questions about your:  Alcohol use.  Tobacco use.  Drug use.  Emotional well-being.  Home and relationship well-being.  Sexual activity.  Eating habits.  History of falls.  Memory and ability to understand (cognition).  Work and work Statistician.  Reproductive health. Screening  You may have the following tests or measurements:  Height, weight, and BMI.  Blood pressure.  Lipid and cholesterol levels. These may be checked every 5 years, or more frequently if you are over 89 years old.  Skin check.  Lung cancer screening. You may have this screening every year starting at age 74 if you have a 30-pack-year history of smoking and currently smoke or have quit within the past 15 years.  Fecal occult blood test (FOBT) of the stool. You may have this test every year starting at age 61.  Flexible sigmoidoscopy or colonoscopy. You may have a sigmoidoscopy every 5 years or a colonoscopy every 10 years starting at age 66.  Hepatitis C blood test.  Hepatitis B blood test.  Sexually transmitted disease (STD) testing.  Diabetes screening. This is done by checking your blood sugar (glucose) after you have not eaten for a while (fasting). You may have this done every 1-3 years.  Bone density scan. This is done to screen for osteoporosis. You may have this done starting at age 98.  Mammogram. This may be done every 1-2 years. Talk to your health care provider about how often you should have regular mammograms. Talk with your health care provider about your test results, treatment options, and if necessary, the need for more tests. Vaccines  Your health care provider may recommend certain vaccines, such as:  Influenza vaccine. This is  recommended every year.  Tetanus, diphtheria, and acellular pertussis (Tdap, Td) vaccine. You may need a Td booster every 10 years.  Zoster vaccine. You may need this after age  76.  Pneumococcal 13-valent conjugate (PCV13) vaccine. One dose is recommended after age 2.  Pneumococcal polysaccharide (PPSV23) vaccine. One dose is recommended after age 69. Talk to your health care provider about which screenings and vaccines you need and how often you need them. This information is not intended to replace advice given to you by your health care provider. Make sure you discuss any questions you have with your health care provider. Document Released: 05/18/2015 Document Revised: 01/09/2016 Document Reviewed: 02/20/2015 Elsevier Interactive Patient Education  2017 [place] Prevention in the Home Falls can cause injuries. They can happen to people of all ages. There are many things you can do to make your home safe and to help prevent falls. What can I do on the outside of my home?  Regularly fix the edges of walkways and driveways and fix any cracks.  Remove anything that might make you trip as you walk through a door, such as a raised step or threshold.  Trim any bushes or trees on the path to your home.  Use bright outdoor lighting.  Clear any walking paths of anything that might make someone trip, such as rocks or tools.  Regularly check to see if handrails are loose or broken. Make sure that both sides of any steps have handrails.  Any raised decks and porches should have guardrails on the edges.  Have any leaves, snow, or ice cleared regularly.  Use sand or salt on walking paths during winter.  Clean up any spills in your garage right away. This includes oil or grease spills. What can I do in the bathroom?  Use night lights.  Install grab bars by the toilet and in the tub and shower. Do not use towel bars as grab bars.  Use non-skid mats or decals in the tub or shower.  If you need to sit down in the shower, use a plastic, non-slip stool.  Keep the floor dry. Clean up any water that spills on the floor as soon as it happens.  Remove  soap buildup in the tub or shower regularly.  Attach bath mats securely with double-sided non-slip rug tape.  Do not have throw rugs and other things on the floor that can make you trip. What can I do in the bedroom?  Use night lights.  Make sure that you have a light by your bed that is easy to reach.  Do not use any sheets or blankets that are too big for your bed. They should not hang down onto the floor.  Have a firm chair that has side arms. You can use this for support while you get dressed.  Do not have throw rugs and other things on the floor that can make you trip. What can I do in the kitchen?  Clean up any spills right away.  Avoid walking on wet floors.  Keep items that you use a lot in easy-to-reach places.  If you need to reach something above you, use a strong step stool that has a grab bar.  Keep electrical cords out of the way.  Do not use floor polish or wax that makes floors slippery. If you must use wax, use non-skid floor wax.  Do not have throw rugs and other things on the floor that can make you trip.  What can I do with my stairs?  Do not leave any items on the stairs.  Make sure that there are handrails on both sides of the stairs and use them. Fix handrails that are broken or loose. Make sure that handrails are as long as the stairways.  Check any carpeting to make sure that it is firmly attached to the stairs. Fix any carpet that is loose or worn.  Avoid having throw rugs at the top or bottom of the stairs. If you do have throw rugs, attach them to the floor with carpet tape.  Make sure that you have a light switch at the top of the stairs and the bottom of the stairs. If you do not have them, ask someone to add them for you. What else can I do to help prevent falls?  Wear shoes that:  Do not have high heels.  Have rubber bottoms.  Are comfortable and fit you well.  Are closed at the toe. Do not wear sandals.  If you use a  stepladder:  Make sure that it is fully opened. Do not climb a closed stepladder.  Make sure that both sides of the stepladder are locked into place.  Ask someone to hold it for you, if possible.  Clearly mark and make sure that you can see:  Any grab bars or handrails.  First and last steps.  Where the edge of each step is.  Use tools that help you move around (mobility aids) if they are needed. These include:  Canes.  Walkers.  Scooters.  Crutches.  Turn on the lights when you go into a dark area. Replace any light bulbs as soon as they burn out.  Set up your furniture so you have a clear path. Avoid moving your furniture around.  If any of your floors are uneven, fix them.  If there are any pets around you, be aware of where they are.  Review your medicines with your doctor. Some medicines can make you feel dizzy. This can increase your chance of falling. Ask your doctor what other things that you can do to help prevent falls. This information is not intended to replace advice given to you by your health care provider. Make sure you discuss any questions you have with your health care provider. Document Released: 02/15/2009 Document Revised: 09/27/2015 Document Reviewed: 05/26/2014 Elsevier Interactive Patient Education  2017 [employer].

## 2020-06-21 NOTE — Progress Notes (Signed)
I,Katawbba Wiggins,acting as a Education administrator for Maximino Greenland, MD.,have documented all relevant documentation on the behalf of Maximino Greenland, MD,as directed by  Maximino Greenland, MD while in the presence of Maximino Greenland, MD.  This visit occurred during the SARS-CoV-2 public health emergency.  Safety protocols were in place, including screening questions prior to the visit, additional usage of staff PPE, and extensive cleaning of exam room while observing appropriate contact time as indicated for disinfecting solutions.  Subjective:     Patient ID: Yvonne Miller , female    DOB: August 06, 1950 , 70 y.o.   MRN: [id number]   Chief Complaint  Patient presents with  . Diabetes  . Hypertension  . Hyperlipidemia    HPI   Patient is here for dm, blood pressure and cholesterol follow up. She reports compliance with medications. She has no specific concerns today. She is also scheduled to see [facility] Advisor for AWV today.   Diabetes She presents for her follow-up diabetic visit. She has type 2 diabetes mellitus. There are no hypoglycemic associated symptoms. Pertinent negatives for diabetes include no blurred vision and no chest pain. There are no hypoglycemic complications. Risk factors for coronary artery disease include diabetes mellitus, dyslipidemia, hypertension, post-menopausal, sedentary lifestyle and obesity. Current diabetic treatment includes insulin injections. She is compliant with treatment some of the time. She is following a diabetic diet. She participates in exercise intermittently.  Hypertension This is a chronic problem. The current episode started more than 1 year ago. The problem has been gradually improving since onset. The problem is controlled. Pertinent negatives include no blurred vision, chest pain, palpitations or shortness of breath. The current treatment provides moderate improvement. Compliance problems include exercise.  Hypertensive end-organ damage includes kidney disease and  heart failure.     Past Medical History:  Diagnosis Date  . AICD (automatic cardioverter/defibrillator) present   . Arthritis   . Asthma   . Carpal tunnel syndrome, bilateral   . CKD (chronic kidney disease)   . Diabetes mellitus   . Encounter for assessment of implantable cardioverter-defibrillator (ICD)   . GERD (gastroesophageal reflux disease)   . Hyperlipemia   . Hypertension   . Obesity    morbid  . Sleep apnea    wears CPAP set at 11  . Wears glasses      Family History  Problem Relation Age of Onset  . Hypertension Mother   . Stroke Mother   . Heart disease Father   . Heart attack Sister   . Heart attack Brother      Current Outpatient Medications:  .  ACCU-CHEK AVIVA PLUS test strip, CHECK 4 TIMES BY INTRADERMAL ROUTE EVERY DAY, Disp: 200 strip, Rfl: 11 .  albuterol (PROVENTIL HFA;VENTOLIN HFA) 108 (90 Base) MCG/ACT inhaler, Inhale 2 puffs into the lungs every 4 (four) hours as needed for wheezing or shortness of breath. , Disp: , Rfl:  .  amLODipine (NORVASC) 5 MG tablet, TAKE 1 TABLET BY MOUTH  DAILY, Disp: 90 tablet, Rfl: 3 .  aspirin EC 81 MG tablet, Take 81 mg by mouth at bedtime. , Disp: , Rfl:  .  B Complex Vitamins (B COMPLEX-B12) TABS, Take 1 tablet by mouth daily., Disp: , Rfl:  .  carvedilol (COREG) 25 MG tablet, TAKE 1 AND 1/2 TABLETS BY  MOUTH TWICE DAILY, Disp: 270 tablet, Rfl: 3 .  Cholecalciferol (VITAMIN D) 50 MCG (2000 UT) tablet, Take 2,000 Units by mouth daily., Disp: , Rfl:  .  Chromium 1000 MCG TABS, Take 1,000 mcg by mouth daily. , Disp: , Rfl:  .  Cinnamon 500 MG capsule, Take 500 mg by mouth daily., Disp: , Rfl:  .  fluticasone (FLONASE) 50 MCG/ACT nasal spray, Place 1 spray into both nostrils daily. (Patient taking differently: Place 1 spray into both nostrils as needed for allergies.), Disp: 16 g, Rfl: 2 .  hydrALAZINE (APRESOLINE) 50 MG tablet, TAKE 1 TABLET BY MOUTH  TWICE DAILY, Disp: 180 tablet, Rfl: 3 .  insulin lispro  protamine-lispro (HUMALOG 75/25 MIX) (75-25) 100 UNIT/ML SUSP injection, Inject 30-40 Units into the skin daily before supper. (Patient taking differently: Inject 45 Units into the skin daily before supper. Sliding scale), Disp: 10 mL, Rfl: 3 .  Omega-3 Fatty Acids (FISH OIL) 500 MG CAPS, Take 500 mg by mouth daily., Disp: , Rfl:  .  omeprazole (PRILOSEC) 40 MG capsule, Take 40 mg by mouth daily., Disp: , Rfl:  .  pravastatin (PRAVACHOL) 40 MG tablet, TAKE 1 TABLET BY MOUTH  DAILY, Disp: 90 tablet, Rfl: 3 .  Probiotic Product (PROBIOTIC PO), Take by mouth., Disp: , Rfl:  .  ROCKLATAN 0.02-0.005 % SOLN, SMARTSIG:1 Drop(s) In Eye(s) Every Evening, Disp: , Rfl:  .  spironolactone (ALDACTONE) 25 MG tablet, TAKE 1 TABLET BY MOUTH IN  THE MORNING, Disp: 90 tablet, Rfl: 3 .  TRULICITY 1.5 WO/0.3OZ SOPN, INJECT 1.5 MG INTO THE SKIN ONCE A WEEK. (Patient taking differently: Inject 1.5 mg as directed every Wednesday.), Disp: 2 pen, Rfl: 2 .  valsartan-hydrochlorothiazide (DIOVAN-HCT) 160-25 MG tablet, TAKE 1 TABLET BY MOUTH  DAILY, Disp: 90 tablet, Rfl: 3 .  nystatin (NYSTATIN) powder, Apply topically 3 (three) times daily as needed. As needed, Disp: 60 g, Rfl: 2   Allergies  Allergen Reactions  . Other Other (See Comments)    NO BLOOD   . Jehovah witness     Review of Systems  Constitutional: Negative.   Eyes: Negative for blurred vision.  Respiratory: Negative.  Negative for shortness of breath.   Cardiovascular: Negative.  Negative for chest pain and palpitations.  Gastrointestinal: Negative.   Skin:       She reports she stepped on some glass the first of the year. She is concerned because she feels something when she rubs her foot.  Neurological: Negative.   Psychiatric/Behavioral: Negative.      Today's Vitals   06/21/20 0846  BP: 118/64  Pulse: 77  Temp: (!) 97.4 F (36.3 C)  TempSrc: Oral  Weight: 276 lb 9.6 oz (125.5 kg)  Height: $Remove'5\' 3"'TmqhiQE$  (1.6 m)  PainSc: 0-No pain   Body mass  index is 49 kg/m.  Wt Readings from Last 3 Encounters:  06/21/20 276 lb (125.2 kg)  06/21/20 276 lb 9.6 oz (125.5 kg)  02/07/20 266 lb (120.7 kg)   Objective:  Physical Exam Vitals and nursing note reviewed.  Constitutional:      Appearance: Normal appearance. She is obese.  HENT:     Head: Normocephalic and atraumatic.     Nose:     Comments: Masked     Mouth/Throat:     Comments: Masked  Cardiovascular:     Rate and Rhythm: Normal rate and regular rhythm.     Heart sounds: Normal heart sounds.  Pulmonary:     Effort: Pulmonary effort is normal.     Breath sounds: Normal breath sounds.  Musculoskeletal:     Cervical back: Normal range of motion.  Feet:  Comments: Area of concern examined, bottom of left foot. There is no overlying erythema at puncture wound. No drainage noted.  Skin:    General: Skin is warm.  Neurological:     General: No focal deficit present.     Mental Status: She is alert.  Psychiatric:        Mood and Affect: Mood normal.        Behavior: Behavior normal.         Assessment And Plan:     1. Type 2 diabetes mellitus with stage 3 chronic kidney disease, with long-term current use of insulin, unspecified whether stage 3a or 3b CKD ([place]) Comments: Chronic, I will check labs as listed below. She is encouraged to avoid sugary   She is encouraged to increase exercise to 150 minutes per week.  - Hemoglobin A1c - Lipid panel - CMP14+EGFR - CBC no Diff  2. 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 She is encouraged to follow a low sodium diet.  - Lipid panel - CMP14+EGFR - TSH  3. Pure hypercholesterolemia Comments: Chronic. Importance of statin therapy, increased fiber intake and regular exercise was d/w patient.   4. Puncture wound of left foot, initial encounter Comments: Occurred first week of  January. She will be given Tdap.  - Tdap vaccine greater than or equal to 7yo IM  5. Class 3 severe obesity due to excess calories with serious comorbidity and body mass index (BMI) of 45.0 to 49.9 in adult [facility]) She is encouraged to strive for BMI less than 40 to decrease cardiac risk. Advised to aim for at least 150 minutes of exercise per week.  Patient was given opportunity to ask questions. Patient verbalized understanding of the plan and was able to repeat key elements of the plan. All questions were answered to their satisfaction.   I, Maximino Greenland, MD, have reviewed all documentation for this visit. The documentation on 07/01/20 for the exam, diagnosis, procedures, and orders are all accurate and complete.  THE PATIENT IS ENCOURAGED TO PRACTICE SOCIAL DISTANCING DUE TO THE COVID-19 PANDEMIC.

## 2020-06-21 NOTE — Patient Instructions (Signed)
Diabetes Mellitus and Foot Care Foot care is an important part of your health, especially when you have diabetes. Diabetes may cause you to have problems because of poor blood flow (circulation) to your feet and legs, which can cause your skin to:  Become thinner and drier.  Break more easily.  Heal more slowly.  Peel and crack. You may also have nerve damage (neuropathy) in your legs and feet, causing decreased feeling in them. This means that you may not notice minor injuries to your feet that could lead to more serious problems. Noticing and addressing any potential problems early is the best way to prevent future foot problems. How to care for your feet Foot hygiene  Wash your feet daily with warm water and mild soap. Do not use hot water. Then, pat your feet and the areas between your toes until they are completely dry. Do not soak your feet as this can dry your skin.  Trim your toenails straight across. Do not dig under them or around the cuticle. File the edges of your nails with an emery board or nail file.  Apply a moisturizing lotion or petroleum jelly to the skin on your feet and to dry, brittle toenails. Use lotion that does not contain alcohol and is unscented. Do not apply lotion between your toes.   Shoes and socks  Wear clean socks or stockings every day. Make sure they are not too tight. Do not wear knee-high stockings since they may decrease blood flow to your legs.  Wear shoes that fit properly and have enough cushioning. Always look in your shoes before you put them on to be sure there are no objects inside.  To break in new shoes, wear them for just a few hours a day. This prevents injuries on your feet. Wounds, scrapes, corns, and calluses  Check your feet daily for blisters, cuts, bruises, sores, and redness. If you cannot see the bottom of your feet, use a mirror or ask someone for help.  Do not cut corns or calluses or try to remove them with medicine.  If you  find a minor scrape, cut, or break in the skin on your feet, keep it and the skin around it clean and dry. You may clean these areas with mild soap and water. Do not clean the area with peroxide, alcohol, or iodine.  If you have a wound, scrape, corn, or callus on your foot, look at it several times a day to make sure it is healing and not infected. Check for: ? Redness, swelling, or pain. ? Fluid or blood. ? Warmth. ? Pus or a bad smell.   General tips  Do not cross your legs. This may decrease blood flow to your feet.  Do not use heating pads or hot water bottles on your feet. They may burn your skin. If you have lost feeling in your feet or legs, you may not know this is happening until it is too late.  Protect your feet from hot and cold by wearing shoes, such as at the beach or on hot pavement.  Schedule a complete foot exam at least once a year (annually) or more often if you have foot problems. Report any cuts, sores, or bruises to your health care provider immediately. Where to find more information  American Diabetes Association: www.diabetes.org  Association of Diabetes Care & Education Specialists: www.diabeteseducator.org Contact a health care provider if:  You have a medical condition that increases your risk of infection and   you have any cuts, sores, or bruises on your feet.  You have an injury that is not healing.  You have redness on your legs or feet.  You feel burning or tingling in your legs or feet.  You have pain or cramps in your legs and feet.  Your legs or feet are numb.  Your feet always feel cold.  You have pain around any toenails. Get help right away if:  You have a wound, scrape, corn, or callus on your foot and: ? You have pain, swelling, or redness that gets worse. ? You have fluid or blood coming from the wound, scrape, corn, or callus. ? Your wound, scrape, corn, or callus feels warm to the touch. ? You have pus or a bad smell coming from  the wound, scrape, corn, or callus. ? You have a fever. ? You have a red line going up your leg. Summary  Check your feet every day for blisters, cuts, bruises, sores, and redness.  Apply a moisturizing lotion or petroleum jelly to the skin on your feet and to dry, brittle toenails.  Wear shoes that fit properly and have enough cushioning.  If you have foot problems, report any cuts, sores, or bruises to your health care provider immediately.  Schedule a complete foot exam at least once a year (annually) or more often if you have foot problems. This information is not intended to replace advice given to you by your health care provider. Make sure you discuss any questions you have with your health care provider. Document Revised: 11/10/2019 Document Reviewed: 11/10/2019 Elsevier Patient Education  2021 [employer]  

## 2020-06-22 ENCOUNTER — Telehealth: Payer: Medicare Other

## 2020-06-22 LAB — CMP14+EGFR
ALT: 15 IU/L (ref 0–32)
AST: 14 IU/L (ref 0–40)
Albumin/Globulin Ratio: 1.2 (ref 1.2–2.2)
Albumin: 4 g/dL (ref 3.8–4.8)
Alkaline Phosphatase: 107 IU/L (ref 44–121)
BUN/Creatinine Ratio: 16 (ref 12–28)
BUN: 22 mg/dL (ref 8–27)
Bilirubin Total: 0.2 mg/dL (ref 0.0–1.2)
CO2: 19 mmol/L — ABNORMAL LOW (ref 20–29)
Calcium: 9.3 mg/dL (ref 8.7–10.3)
Chloride: 103 mmol/L (ref 96–106)
Creatinine, Ser: 1.37 mg/dL — ABNORMAL HIGH (ref 0.57–1.00)
GFR calc Af Amer: 45 mL/min/{1.73_m2} — ABNORMAL LOW (ref >59)
GFR calc non Af Amer: 39 mL/min/{1.73_m2} — ABNORMAL LOW (ref >59)
Globulin, Total: 3.3 g/dL (ref 1.5–4.5)
Glucose: 129 mg/dL — ABNORMAL HIGH (ref 65–99)
Potassium: 4.7 mmol/L (ref 3.5–5.2)
Sodium: 138 mmol/L (ref 134–144)
Total Protein: 7.3 g/dL (ref 6.0–8.5)

## 2020-06-22 LAB — HEMOGLOBIN A1C
Est. average glucose Bld gHb Est-mCnc: 174 mg/dL
Hgb A1c MFr Bld: 7.7 % — ABNORMAL HIGH (ref 4.8–5.6)

## 2020-06-22 LAB — CBC
Hematocrit: 36.8 % (ref 34.0–46.6)
Hemoglobin: 11.6 g/dL (ref 11.1–15.9)
MCH: 25.6 pg — ABNORMAL LOW (ref 26.6–33.0)
MCHC: 31.5 g/dL (ref 31.5–35.7)
MCV: 81 fL (ref 79–97)
Platelets: 333 10*3/uL (ref 150–450)
RBC: 4.54 x10E6/uL (ref 3.77–5.28)
RDW: 13.9 % (ref 11.7–15.4)
WBC: 10.5 10*3/uL (ref 3.4–10.8)

## 2020-06-22 LAB — LIPID PANEL
Chol/HDL Ratio: 3 ratio (ref 0.0–4.4)
Cholesterol, Total: 124 mg/dL (ref 100–199)
HDL: 42 mg/dL (ref >39)
LDL Chol Calc (NIH): 61 mg/dL (ref 0–99)
Triglycerides: 117 mg/dL (ref 0–149)
VLDL Cholesterol Cal: 21 mg/dL (ref 5–40)

## 2020-06-22 LAB — TSH: TSH: 1.11 u[IU]/mL (ref 0.450–4.500)

## 2020-07-10 NOTE — Telephone Encounter (Cosign Needed)
Yes mam if you could print a prescription out I can fax to [facility], if you could give to [facility]. Thank you

## 2020-07-11 ENCOUNTER — Other Ambulatory Visit: Payer: Self-pay

## 2020-07-11 MED ORDER — INSULIN LISPRO PROT & LISPRO (75-25 MIX) 100 UNIT/ML KWIKPEN: 45.0000 [IU] | PEN_INJECTOR | Freq: Every evening | SUBCUTANEOUS | 3 refills | Status: DC

## 2020-07-12 ENCOUNTER — Ambulatory Visit (INDEPENDENT_AMBULATORY_CARE_PROVIDER_SITE_OTHER): Payer: Medicare Other

## 2020-07-12 DIAGNOSIS — E1122 Type 2 diabetes mellitus with diabetic chronic kidney disease: Secondary | ICD-10-CM

## 2020-07-12 DIAGNOSIS — I10 Essential (primary) hypertension: Secondary | ICD-10-CM | POA: Diagnosis not present

## 2020-07-12 DIAGNOSIS — Z794 Long term (current) use of insulin: Secondary | ICD-10-CM

## 2020-07-12 DIAGNOSIS — N183 Chronic kidney disease, stage 3 unspecified: Secondary | ICD-10-CM | POA: Diagnosis not present

## 2020-07-12 NOTE — Progress Notes (Signed)
Chronic Care Management Pharmacy Note  07/24/2020 Name:  Yvonne Miller MRN:  [id number] DOB:  23-Oct-1950  Subjective: Yvonne Miller is an 70 y.o. year old female who is a primary patient of [name], MD.  The CCM team was consulted for assistance with disease management and care coordination needs.    Engaged with patient by telephone for follow up visit in response to provider referral for pharmacy case management and/or care coordination services.   Consent to Services:  The patient was given information about Chronic Care Management services, agreed to services, and gave verbal consent prior to initiation of services.  Please see initial visit note for detailed documentation.   Patient Care Team: [name], MD as PCP - General (Internal Medicine) Lynne Logan, RN as Case Manager [name], [facility] (Pharmacist)  Recent office visits: 06/21/2020 PCP [facility] visits: None in previous 6 months  Objective:  Lab Results  Component Value Date   CREATININE 1.37 (H) 06/21/2020   BUN 22 06/21/2020   GFRNONAA 39 (L) 06/21/2020   GFRAA 45 (L) 06/21/2020   NA 138 06/21/2020   K 4.7 06/21/2020   CALCIUM 9.3 06/21/2020   CO2 19 (L) 06/21/2020    Lab Results  Component Value Date/Time   HGBA1C 7.7 (H) 06/21/2020 11:37 AM   HGBA1C 7.3 (H) 02/07/2020 10:50 AM   MICROALBUR 30 10/05/2019 12:07 PM   MICROALBUR 10 09/30/2018 03:26 PM    Last diabetic Eye exam:  Lab Results  Component Value Date/Time   HMDIABEYEEXA No Retinopathy 12/02/2019 12:00 AM    Last diabetic Foot exam: No results found for: HMDIABFOOTEX   Lab Results  Component Value Date   CHOL 124 06/21/2020   HDL 42 06/21/2020   LDLCALC 61 06/21/2020   TRIG 117 06/21/2020   CHOLHDL 3.0 06/21/2020    Hepatic Function Latest Ref Rng & Units 06/21/2020 10/05/2019 06/16/2019  Total Protein 6.0 - 8.5 g/dL 7.3 7.4 7.0  Albumin 3.8 - 4.8 g/dL 4.0 4.3 4.1  AST 0 - 40 IU/L _0 ALT 0 - 32 IU/L  _1 Alk Phosphatase 44 - 121 IU/L 107 98 107  Total Bilirubin 0.0 - 1.2 mg/dL 0.2 <0.2 <0.2      CBC Latest Ref Rng & Units 06/21/2020 10/05/2019 09/30/2018  WBC 3.4 - 10.8 x10E3/uL 10.5 10.6 9.3  Hemoglobin 11.1 - 15.9 g/dL 11.6 11.5 12.0  Hematocrit 34.0 - 46.6 % 36.8 36.1 36.0  Platelets 150 - 450 x10E3/uL 333 328 367    No results found for: VD25OH  Clinical ASCVD: No The ASCVD Risk score Mikey Bussing DC Jr., et al., 2013) failed to calculate for the following reasons:   The valid total cholesterol range is 130 to 320 mg/dL    Depression screen [facility] 2/9 06/21/2020 10/05/2019 06/16/2019  Decreased Interest 0 0 0  Down, Depressed, Hopeless 0 0 0  PHQ - 2 Score 0 0 0  Altered sleeping - - 0  Tired, decreased energy - - 0  Change in appetite - - 0  Feeling bad or failure about yourself  - - 0  Trouble concentrating - - 0  Moving slowly or fidgety/restless - - 0  Suicidal thoughts - - 0  PHQ-9 Score - - 0  Difficult doing work/chores - - Not difficult at all     Social History   Tobacco Use  Smoking Status Never Smoker  Smokeless Tobacco Never Used  BP Readings from Last 3 Encounters:  06/21/20 118/64  06/21/20 118/64  02/07/20 134/70   Pulse Readings from Last 3 Encounters:  06/21/20 77  06/21/20 77  02/07/20 68   Wt Readings from Last 3 Encounters:  06/21/20 276 lb (125.2 kg)  06/21/20 276 lb 9.6 oz (125.5 kg)  02/07/20 266 lb (120.7 kg)    Assessment/Interventions: Review of patient past medical history, allergies, medications, health status, including review of consultants reports, laboratory and other test data, was performed as part of comprehensive evaluation and provision of chronic care management services.   SDOH:  (Social Determinants of Health) assessments and interventions performed: No   CCM Care Plan  Allergies  Allergen Reactions  . Other Other (See Comments)    NO BLOOD   . Jehovah witness    Medications Reviewed Today    Reviewed by  Kellie Simmering, LPN (Licensed Practical Nurse) on 06/21/20 at [place] List Status: <None>  Medication Order Taking? Sig Documenting Provider Last Dose Status Informant  ACCU-CHEK AVIVA PLUS test strip [id number] No CHECK 4 TIMES BY INTRADERMAL ROUTE EVERY DAY [name], MD Taking Active   albuterol (PROVENTIL HFA;VENTOLIN HFA) 108 (90 Base) MCG/ACT inhaler [id number] No Inhale 2 puffs into the lungs every 4 (four) hours as needed for wheezing or shortness of breath.  [provider] Taking Active Self  amLODipine (NORVASC) 5 MG tablet [id number] No TAKE 1 TABLET BY MOUTH  DAILY Adrian Prows, MD Taking Active   aspirin EC 81 MG tablet [id number] No Take 81 mg by mouth at bedtime.  [provider] Taking Active Self           Med Note Michelle Nasuti   Thu Jun 16, 2019  8:38 AM)    B Complex Vitamins (B COMPLEX-B12) TABS [id number] No Take 1 tablet by mouth daily. [provider] Taking Active Self           Med Note Jannifer Rodney, APRIL   Thu Oct 21, 2018  8:17 AM)    carvedilol (COREG) 25 MG tablet [id number] No TAKE 1 AND 1/2 TABLETS BY  MOUTH TWICE DAILY Adrian Prows, MD Taking Active   Cholecalciferol (VITAMIN D) 50 MCG (2000 UT) tablet [id number] No Take 2,000 Units by mouth daily. [provider] Taking Active   Chromium 1000 MCG TABS [id number] No Take 1,000 mcg by mouth daily.  [provider] Taking Active   Cinnamon 500 MG capsule [id number] No Take 500 mg by mouth daily. [provider] Taking Active Self           Med Note Jannifer Rodney, APRIL   Thu Oct 21, 2018  8:17 AM)    fluticasone (FLONASE) 50 MCG/ACT nasal spray [id number] No Place 1 spray into both nostrils daily.  Patient taking differently: Place 1 spray into both nostrils as needed for allergies.   [name], MD Taking Expired 02/07/20 2359   hydrALAZINE (APRESOLINE) 50 MG tablet [id number] No TAKE 1 TABLET BY MOUTH  TWICE DAILY Adrian Prows, MD Taking Active   insulin  lispro protamine-lispro (HUMALOG 75/25 MIX) (75-25) 100 UNIT/ML SUSP injection [id number] No Inject 30-40 Units into the skin daily before supper.  Patient taking differently: Inject 45 Units into the skin daily before supper. Sliding scale   [name], MD Taking Active   nystatin (NYSTATIN) powder [id number] No Apply topically 3 (three) times daily as needed. As needed  Patient taking differently: Apply 1 g topically 3 (three) times daily as needed (yeast).  [name], MD Taking Active Self           Med Note Jannifer Rodney, APRIL   Thu Oct 21, 2018  8:19 AM)    Omega-3 Fatty Acids (FISH OIL) 500 MG CAPS [id number] No Take 500 mg by mouth daily. [provider] Taking Active   omeprazole (PRILOSEC) 40 MG capsule [id number] No Take 40 mg by mouth daily. [provider] Taking Active   pravastatin (PRAVACHOL) 40 MG tablet [id number] No TAKE 1 TABLET BY MOUTH  DAILY [name], MD Taking Active   Probiotic Product (PROBIOTIC PO) [id number] No Take by mouth. [provider] Taking Active   ROCKLATAN 0.02-0.005 % SOLN [id number] No SMARTSIG:1 Drop(s) In Eye(s) Every Evening [provider] Taking Active   spironolactone (ALDACTONE) 25 MG tablet [id number] No TAKE 1 TABLET BY MOUTH IN  THE Alfonso Patten, MD Taking Active   TRULICITY 1.5 GX/2.1JH SOPN [id number] No INJECT 1.5 MG INTO THE SKIN ONCE A WEEK.  Patient taking differently: Inject 1.5 mg as directed every Wednesday.   [name], MD Taking Active   valsartan-hydrochlorothiazide (DIOVAN-HCT) 160-25 MG tablet [id number] No TAKE 1 TABLET BY MOUTH  DAILY [name], MD Taking Active           Patient Active Problem List   Diagnosis Date Noted  . Non-ischemic cardiomyopathy ([place]) 02/18/2019  . Encounter for assessment of implantable cardioverter-defibrillator (ICD)   . Type 2 diabetes mellitus with stage 3 chronic kidney disease, with long-term current use of insulin ([place]) 03/04/2018  .  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  . Chronic combined systolic and diastolic congestive heart failure ([place]) 03/04/2018  . Chronic renal disease, stage II 03/04/2018  . Status post revision of total replacement of left knee 01/26/2018  . Unilateral primary osteoarthritis, left knee 08/18/2017  . Biventricular automatic implantable cardioverter defibrillator in situ 12/13/2013  . ICD -  Biventricular  [employer] Dynagen X4 CRT-D Model G158 ICD in place 10/28/13 10/28/2013  . HTN (hypertension) 10/03/2013  . Hyperlipemia 10/03/2013  . LBBB (left bundle branch block) 10/03/2013  . Cardiomyopathy ([place]) 10/03/2013  . DM (diabetes mellitus) ([place]) 10/03/2013    Immunization History  Administered Date(s) Administered  . Fluad Quad(high Dose 65+) 02/07/2020  . Hepatitis A 01/19/2012, 02/16/2012  . Hepatitis B 01/19/2012, 02/16/2012  . IPV 02/16/2012  . Influenza, High Dose Seasonal PF 02/17/2016, 03/04/2018, 02/10/2019  . Influenza-Unspecified 02/02/2014, 02/16/2014  . MMR 04/04/1994  . PFIZER(Purple Top)SARS-COV-2 Vaccination 06/11/2019, 07/02/2019, 02/21/2020  . Pneumococcal Conjugate-13 05/16/2020  . Td 09/08/2006  . Tdap 01/19/2012, 06/21/2020  . Zoster 12/15/2007    Conditions to be addressed/monitored:  Hypertension and Diabetes  Care Plan : [place]  Updates made by [name], RPH since 07/24/2020 12:00 AM    Problem: HTN, DM II   Priority: High    Long-Range Goal: Disease Management   Start Date: 07/12/2020  This Visit's Progress: On track  Priority: High  Note:     Current Barriers:  . Unable to independently monitor therapeutic efficacy   Pharmacist Clinical Goal(s):  [name] Over the next 90 days, patient will verbalize ability to afford treatment regimen through collaboration with PharmD and provider.   Interventions: . 1:1 collaboration with [name], MD regarding development and update of comprehensive plan of care as  evidenced by provider attestation and co-signature . Inter-disciplinary care team collaboration (see longitudinal plan of care) . Comprehensive medication review performed; medication list updated in electronic medical  record  Hypertension (BP goal <130/80) -Controlled -Current treatment: . Valsartan - HCTZ 160-25 mg tablet once per day  . Carvedilol 25 mg taking 1.5 tablets by mouth twice per day  . Amlodipine 5 mg tablet daily  -Current home readings: 117/68  -Current dietary habits: Limit salt  -Current exercise habits: patient reports that she is exercising a bit more but she was  -Denies hypotensive/hypertensive symptoms -Educated on BP goals and benefits of medications for prevention of heart attack, stroke and kidney damage; -Counseled to monitor BP at home at least once per day, document, and provide log at future appointments -Recommended to continue current medication  Diabetes (A1c goal <7%) -Not ideally controlled -Current medications:  Trulicity 1.5 mg inject into the skin once a week   Humalog 75/25 Kwik Pen inject 45 units at the bedtime  -Current home glucose readings . fasting glucose: 119, 120, 97 -Denies hypoglycemic/hyperglycemic symptoms -Current meal patterns:  . breakfast: egg and a spoonful of grits and slice of bacon, and yesterday was oatment  . lunch: shrimp scampi - left over from dinner, baked chicken and cabbage, and okra . dinner: salad and BBQ chicken  . snacks: grapes, strawberries  . drinks: a lot of water and she avoids sugar  -Current exercise: patient reports that she is not exercising as frequently.  -We discussed the importance of storing pens properly  -Educated on A1c and blood sugar goals; Complications of diabetes including kidney damage, retinal damage, and cardiovascular disease; Proper insulin injection technique; -Counseled to check feet daily and get yearly eye exams -Recommended to continue current medication   Patient  Goals/Self-Care Activities . Over the next 90 days, patient will:  - take medications as prescribed  Follow Up Plan: The patient has been provided with contact information for the care management team and has been advised to call with any health related questions or concerns.  Follow up appointment on 08/08/2020.       Medication Assistance: None required.  Patient affirms current coverage meets needs.  Patient's preferred pharmacy is:  [facility], [address], [address] [place], [place] [address] Phone: [phone] Fax: [phone]  [id number] [place] [address]Phone: [phone]: [phone] Uses pill box? Yes Pt endorses 95% compliance  We discussed: Current pharmacy is preferred with insurance plan and patient is satisfied with pharmacy services Patient decided to: Continue current medication management strategy  Care Plan and Follow Up Patient Decision:  Patient agrees to Care Plan and Follow-up.  Plan: Telephone follow up appointment with care management team member scheduled for:  08/08/2020  VOrlando Penner PharmD Clinical Pharmacist [facility] [phone]

## 2020-07-19 ENCOUNTER — Encounter: Payer: Self-pay | Admitting: Internal Medicine

## 2020-07-20 ENCOUNTER — Telehealth: Payer: Medicare Other

## 2020-07-24 NOTE — Patient Instructions (Signed)
Visit Information It was great speaking with you today!  Please let me know if you have any questions about our visit.  Goals Addressed            This Visit's Progress   . Monitor and Manage My Blood Sugar-Diabetes Type 2       Timeframe:  Long-Range Goal Priority:  High Start Date:                             Expected End Date:                       Follow Up Date 08/08/2020   - check blood sugar at prescribed times - take the blood sugar log to all doctor visits - take the blood sugar meter to all doctor visits    Why is this important?    Checking your blood sugar at home helps to keep it from getting very high or very low.   Writing the results in a diary or log helps the doctor know how to care for you.   Your blood sugar log should have the time, date and the results.   Also, write down the amount of insulin or other medicine that you take.   Other information, like what you ate, exercise done and how you were feeling, will also be helpful.     Notes:        Patient Care Plan: CCM Pharmacy Care Plan    Problem Identified: HTN, DM II   Priority: High    Long-Range Goal: Disease Management   Start Date: 07/12/2020  This Visit's Progress: On track  Priority: High  Note:     Current Barriers:  . Unable to independently monitor therapeutic efficacy   Pharmacist Clinical Goal(s):  [name] Over the next 90 days, patient will verbalize ability to afford treatment regimen through collaboration with PharmD and provider.   Interventions: . 1:1 collaboration with [name], MD regarding development and update of comprehensive plan of care as evidenced by provider attestation and co-signature . Inter-disciplinary care team collaboration (see longitudinal plan of care) . Comprehensive medication review performed; medication list updated in electronic medical record  Hypertension (BP goal <130/80) -Controlled -Current treatment: . Valsartan - HCTZ 160-25 mg  tablet once per day  . Carvedilol 25 mg taking 1.5 tablets by mouth twice per day  . Amlodipine 5 mg tablet daily  -Current home readings: 117/68  -Current dietary habits: Limit salt  -Current exercise habits: patient reports that she is exercising a bit more but she was  -Denies hypotensive/hypertensive symptoms -Educated on BP goals and benefits of medications for prevention of heart attack, stroke and kidney damage; -Counseled to monitor BP at home at least once per day, document, and provide log at future appointments -Recommended to continue current medication  Diabetes (A1c goal <7%) -Not ideally controlled -Current medications:  Trulicity 1.5 mg inject into the skin once a week   Humalog 75/25 Kwik Pen inject 45 units at the bedtime  -Current home glucose readings . fasting glucose: 119, 120, 97 -Denies hypoglycemic/hyperglycemic symptoms -Current meal patterns:  . breakfast: egg and a spoonful of grits and slice of bacon, and yesterday was oatment  . lunch: shrimp scampi - left over from dinner, baked chicken and cabbage, and okra . dinner: salad and BBQ chicken  . snacks: grapes, strawberries  . drinks: a lot of water and she  avoids sugar  -Current exercise: patient reports that she is not exercising as frequently.  -We discussed the importance of storing pens properly  -Educated on A1c and blood sugar goals; Complications of diabetes including kidney damage, retinal damage, and cardiovascular disease; Proper insulin injection technique; -Counseled to check feet daily and get yearly eye exams -Recommended to continue current medication   Patient Goals/Self-Care Activities . Over the next 90 days, patient will:  - take medications as prescribed  Follow Up Plan: The patient has been provided with contact information for the care management team and has been advised to call with any health related questions or concerns.  Follow up appointment on 08/08/2020.        Patient agreed to services and verbal consent obtained.   The patient verbalized understanding of instructions, educational materials, and care plan provided today and agreed to receive a mailed copy of patient instructions, educational materials, and care plan.   [name], PharmD Clinical Pharmacist [facility] [phone]

## 2020-07-25 ENCOUNTER — Telehealth: Payer: Self-pay

## 2020-08-07 ENCOUNTER — Telehealth: Payer: Self-pay

## 2020-08-07 ENCOUNTER — Other Ambulatory Visit: Payer: Self-pay | Admitting: Gastroenterology

## 2020-08-07 ENCOUNTER — Other Ambulatory Visit (HOSPITAL_COMMUNITY): Payer: Self-pay | Admitting: Gastroenterology

## 2020-08-07 DIAGNOSIS — K219 Gastro-esophageal reflux disease without esophagitis: Secondary | ICD-10-CM | POA: Diagnosis not present

## 2020-08-07 DIAGNOSIS — K7581 Nonalcoholic steatohepatitis (NASH): Secondary | ICD-10-CM | POA: Diagnosis not present

## 2020-08-07 DIAGNOSIS — K5730 Diverticulosis of large intestine without perforation or abscess without bleeding: Secondary | ICD-10-CM | POA: Diagnosis not present

## 2020-08-07 DIAGNOSIS — R6881 Early satiety: Secondary | ICD-10-CM | POA: Diagnosis not present

## 2020-08-07 DIAGNOSIS — R110 Nausea: Secondary | ICD-10-CM

## 2020-08-07 LAB — POCT URINALYSIS DIPSTICK
Bilirubin, UA: NEGATIVE
Glucose, UA: NEGATIVE
Ketones, UA: NEGATIVE
Nitrite, UA: POSITIVE
Protein, UA: NEGATIVE
Spec Grav, UA: 1.02 (ref 1.010–1.025)
Urobilinogen, UA: 0.2 E.U./dL
pH, UA: 5 (ref 5.0–8.0)

## 2020-08-07 NOTE — Progress Notes (Signed)
08/07/20-Called patient to remind her of CCM Call appointment with [name], CPP on 08/08/20 at 8:30 AM. Patient voiced understanding. Patient aware to have medications and supplements near during phone visit.  [name], CPP Notified.  Raynelle Highland, [place] [facility] Pharmacist Assistant [phone] CCM Total Time:9 minutes

## 2020-08-07 NOTE — Telephone Encounter (Signed)
The pt was called to schedule an appt for uti, the pt said that she was told that all she has to do is come by and drop off urine.  I told the pt that I would check with dr sanders again tomorrow and call her back.

## 2020-08-08 ENCOUNTER — Other Ambulatory Visit (INDEPENDENT_AMBULATORY_CARE_PROVIDER_SITE_OTHER): Payer: Medicare Other

## 2020-08-08 ENCOUNTER — Telehealth (INDEPENDENT_AMBULATORY_CARE_PROVIDER_SITE_OTHER): Payer: Medicare Other | Admitting: Nurse Practitioner

## 2020-08-08 ENCOUNTER — Telehealth: Payer: Self-pay

## 2020-08-08 ENCOUNTER — Ambulatory Visit (INDEPENDENT_AMBULATORY_CARE_PROVIDER_SITE_OTHER): Payer: Medicare Other

## 2020-08-08 DIAGNOSIS — E7800 Pure hypercholesterolemia, unspecified: Secondary | ICD-10-CM | POA: Diagnosis not present

## 2020-08-08 DIAGNOSIS — R319 Hematuria, unspecified: Secondary | ICD-10-CM

## 2020-08-08 DIAGNOSIS — N390 Urinary tract infection, site not specified: Secondary | ICD-10-CM | POA: Diagnosis not present

## 2020-08-08 DIAGNOSIS — E1122 Type 2 diabetes mellitus with diabetic chronic kidney disease: Secondary | ICD-10-CM

## 2020-08-08 DIAGNOSIS — N1832 Chronic kidney disease, stage 3b: Secondary | ICD-10-CM | POA: Diagnosis not present

## 2020-08-08 DIAGNOSIS — R82998 Other abnormal findings in urine: Secondary | ICD-10-CM

## 2020-08-08 DIAGNOSIS — Z794 Long term (current) use of insulin: Secondary | ICD-10-CM

## 2020-08-08 DIAGNOSIS — E785 Hyperlipidemia, unspecified: Secondary | ICD-10-CM | POA: Diagnosis not present

## 2020-08-08 MED ORDER — NITROFURANTOIN MONOHYD MACRO 100 MG PO CAPS: 100.0000 mg | ORAL_CAPSULE | Freq: Two times a day (BID) | ORAL | 0 refills | Status: AC

## 2020-08-08 NOTE — Progress Notes (Signed)
Chronic Care Management Pharmacy Note  08/08/2020 Name:  Yvonne Miller MRN:  [id number] DOB:  [birth year]  Subjective: Yvonne Miller is an 70 y.o. year old female who is a primary patient of [name], MD.  The CCM team was consulted for assistance with disease management and care coordination needs.  Patient reports that she a smart watch that she has been using to track her steps.   Engaged with patient by telephone for follow up visit in response to provider referral for pharmacy case management and/or care coordination services.   Consent to Services:  The patient was given information about Chronic Care Management services, agreed to services, and gave verbal consent prior to initiation of services.  Please see initial visit note for detailed documentation.   Patient Care Team: [name], MD as PCP - General (Internal Medicine) Lynne Logan, RN as Case Manager [name], [facility] (Pharmacist)  Recent office visits: 06/21/2020    Recent consult visits: 07/12/2020   Hospital visits: None in previous 6 months  Objective:  Lab Results  Component Value Date   CREATININE 1.37 (H) 06/21/2020   BUN 22 06/21/2020   GFRNONAA 39 (L) 06/21/2020   GFRAA 45 (L) 06/21/2020   NA 138 06/21/2020   K 4.7 06/21/2020   CALCIUM 9.3 06/21/2020   CO2 19 (L) 06/21/2020   GLUCOSE 129 (H) 06/21/2020    Lab Results  Component Value Date/Time   HGBA1C 7.7 (H) 06/21/2020 11:37 AM   HGBA1C 7.3 (H) 02/07/2020 10:50 AM   MICROALBUR 30 10/05/2019 12:07 PM   MICROALBUR 10 09/30/2018 03:26 PM    Last diabetic Eye exam:  Lab Results  Component Value Date/Time   HMDIABEYEEXA No Retinopathy 12/02/2019 12:00 AM    Last diabetic Foot exam: No results found for: HMDIABFOOTEX   Lab Results  Component Value Date   CHOL 124 06/21/2020   HDL 42 06/21/2020   LDLCALC 61 06/21/2020   TRIG 117 06/21/2020   CHOLHDL 3.0 06/21/2020    Hepatic Function Latest Ref Rng & Units  06/21/2020 10/05/2019 06/16/2019  Total Protein 6.0 - 8.5 g/dL 7.3 7.4 7.0  Albumin 3.8 - 4.8 g/dL 4.0 4.3 4.1  AST 0 - 40 IU/L $Remov'14 15 14  'suKBiv$ ALT 0 - 32 IU/L $Remov'15 13 13  'TJzPco$ Alk Phosphatase 44 - 121 IU/L 107 98 107  Total Bilirubin 0.0 - 1.2 mg/dL 0.2 <0.2 <0.2      CBC Latest Ref Rng & Units 06/21/2020 10/05/2019 09/30/2018  WBC 3.4 - 10.8 x10E3/uL 10.5 10.6 9.3  Hemoglobin 11.1 - 15.9 g/dL 11.6 11.5 12.0  Hematocrit 34.0 - 46.6 % 36.8 36.1 36.0  Platelets 150 - 450 x10E3/uL 333 328 367    No results found for: VD25OH  Clinical ASCVD: No  The ASCVD Risk score Mikey Bussing DC Jr., et al., 2013) failed to calculate for the following reasons:   The valid total cholesterol range is 130 to 320 mg/dL    Depression screen [facility] 2/9 06/21/2020 10/05/2019 06/16/2019  Decreased Interest 0 0 0  Down, Depressed, Hopeless 0 0 0  PHQ - 2 Score 0 0 0  Altered sleeping - - 0  Tired, decreased energy - - 0  Change in appetite - - 0  Feeling bad or failure about yourself  - - 0  Trouble concentrating - - 0  Moving slowly or fidgety/restless - - 0  Suicidal thoughts - - 0  PHQ-9 Score - - 0  Difficult  doing work/chores - - Not difficult at all     Social History   Tobacco Use  Smoking Status Never Smoker  Smokeless Tobacco Never Used   BP Readings from Last 3 Encounters:  06/21/20 118/64  06/21/20 118/64  02/07/20 134/70   Pulse Readings from Last 3 Encounters:  06/21/20 77  06/21/20 77  02/07/20 68   Wt Readings from Last 3 Encounters:  06/21/20 276 lb (125.2 kg)  06/21/20 276 lb 9.6 oz (125.5 kg)  02/07/20 266 lb (120.7 kg)   BMI Readings from Last 3 Encounters:  06/21/20 48.89 kg/m  06/21/20 49.00 kg/m  02/07/20 47.12 kg/m    Assessment/Interventions: Review of patient past medical history, allergies, medications, health status, including review of consultants reports, laboratory and other test data, was performed as part of comprehensive evaluation and provision of chronic care management  services.   SDOH:  (Social Determinants of Health) assessments and interventions performed: Yes SDOH Interventions   Flowsheet Row Most Recent Value  SDOH Interventions   Financial Strain Interventions --  [patient assistance application]     SDOH Screenings   Alcohol Screen: Not on file  Depression (PHQ2-9): Low Risk   . PHQ-2 Score: 0  Financial Resource Strain: Low Risk   . Difficulty of Paying Living Expenses: Not hard at all  Food Insecurity: No Food Insecurity  . Worried About Charity fundraiser in the Last Year: Never true  . Ran Out of Food in the Last Year: Never true  Housing: Not on file  Physical Activity: Insufficiently Active  . Days of Exercise per Week: 3 days  . Minutes of Exercise per Session: 30 min  Social Connections: Not on file  Stress: No Stress Concern Present  . Feeling of Stress : Not at all  Tobacco Use: Low Risk   . Smoking Tobacco Use: Never Smoker  . Smokeless Tobacco Use: Never Used  Transportation Needs: No Transportation Needs  . Lack of Transportation (Medical): No  . Lack of Transportation (Non-Medical): No    CCM Care Plan  Allergies  Allergen Reactions  . Other Other (See Comments)    NO BLOOD   . Jehovah witness    Medications Reviewed Today    Reviewed by [name], [facility] (Pharmacist) on 08/08/20 at 0856  Med List Status: <None>  Medication Order Taking? Sig Documenting Provider Last Dose Status Informant  ACCU-CHEK AVIVA PLUS test strip [id number] Yes CHECK 4 TIMES BY INTRADERMAL ROUTE EVERY DAY [name], MD Taking Active   albuterol (PROVENTIL HFA;VENTOLIN HFA) 108 (90 Base) MCG/ACT inhaler [id number] Yes Inhale 2 puffs into the lungs every 4 (four) hours as needed for wheezing or shortness of breath.  [provider] Taking Active Self  amLODipine (NORVASC) 5 MG tablet [id number] Yes TAKE 1 TABLET BY MOUTH  DAILY Adrian Prows, MD Taking Active   aspirin EC 81 MG tablet [id number] Yes Take 81 mg by mouth at  bedtime.  [provider] Taking Active Self           Med Note Michelle Nasuti   Thu Jun 16, 2019  8:38 AM)    B Complex Vitamins (B COMPLEX-B12) TABS [id number] Yes Take 1 tablet by mouth daily. [provider] Taking Active Self           Med Note Jannifer Rodney, APRIL   Thu Oct 21, 2018  8:17 AM)    carvedilol (COREG) 25 MG tablet [id number] Yes TAKE 1 AND 1/2 TABLETS BY  MOUTH  TWICE DAILY Adrian Prows, MD Taking Active   Cholecalciferol (VITAMIN D) 50 MCG (2000 UT) tablet [id number] Yes Take 2,000 Units by mouth daily. [provider] Taking Active   Chromium 1000 MCG TABS [id number] Yes Take 1,000 mcg by mouth daily.  [provider] Taking Active   Cinnamon 500 MG capsule [id number] Yes Take 500 mg by mouth daily. [provider] Taking Active Self           Med Note Jannifer Rodney, APRIL   Thu Oct 21, 2018  8:17 AM)    fluticasone (FLONASE) 50 MCG/ACT nasal spray [id number]  Place 1 spray into both nostrils daily.  Patient taking differently: Place 1 spray into both nostrils as needed for allergies.   [name], MD  Expired 02/07/20 2359   hydrALAZINE (APRESOLINE) 50 MG tablet [id number] Yes TAKE 1 TABLET BY MOUTH  TWICE DAILY Adrian Prows, MD Taking Active   Insulin Lispro Prot & Lispro (HUMALOG MIX 75/25 KWIKPEN) (75-25) 100 UNIT/ML Claiborne Rigg [id number] Yes Inject 45 Units into the skin at bedtime. [name], MD Taking Active   nystatin (NYSTATIN) powder [id number] Yes Apply topically 3 (three) times daily as needed. As needed [name], MD Taking Active   Omega-3 Fatty Acids (FISH OIL) 500 MG CAPS [id number] Yes Take 500 mg by mouth daily. [provider] Taking Active   omeprazole (PRILOSEC) 40 MG capsule [id number] Yes Take 40 mg by mouth daily. [provider] Taking Active   pravastatin (PRAVACHOL) 40 MG tablet [id number] Yes TAKE 1 TABLET BY MOUTH  DAILY [name], MD Taking Active   Probiotic Product (PROBIOTIC PO)  [id number] Yes Take by mouth. [provider] Taking Active   ROCKLATAN 0.02-0.005 % SOLN [id number] Yes SMARTSIG:1 Drop(s) In Eye(s) Every Evening [provider] Taking Active   spironolactone (ALDACTONE) 25 MG tablet [id number] Yes TAKE 1 TABLET BY MOUTH IN  THE Alfonso Patten, MD Taking Active   TRULICITY 1.5 GY/6.5LD SOPN [id number] Yes INJECT 1.5 MG INTO THE SKIN ONCE A WEEK.  Patient taking differently: Inject 1.5 mg as directed every Wednesday.   [name], MD Taking Active   valsartan-hydrochlorothiazide (DIOVAN-HCT) 160-25 MG tablet [id number] Yes TAKE 1 TABLET BY MOUTH  DAILY [name], MD Taking Active           Patient Active Problem List   Diagnosis Date Noted  . Non-ischemic cardiomyopathy ([place]) 02/18/2019  . Encounter for assessment of implantable cardioverter-defibrillator (ICD)   . Type 2 diabetes mellitus with stage 3 chronic kidney disease, with long-term current use of insulin ([place]) 03/04/2018  . 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  . Chronic combined systolic and diastolic congestive heart failure ([place]) 03/04/2018  . Chronic renal disease, stage II 03/04/2018  . Status post revision of total replacement of left knee 01/26/2018  . Unilateral primary osteoarthritis, left knee 08/18/2017  . Biventricular automatic implantable cardioverter defibrillator in situ 12/13/2013  . ICD -  Biventricular  [employer] Dynagen X4 CRT-D Model G158 ICD in place 10/28/13 10/28/2013  . HTN (hypertension) 10/03/2013  . Hyperlipemia 10/03/2013  . LBBB (left bundle branch block) 10/03/2013  . Cardiomyopathy ([place]) 10/03/2013  . DM (diabetes mellitus) ([place]) 10/03/2013    Immunization History  Administered Date(s) Administered  . Fluad Quad(high Dose 65+) 02/07/2020  . Hepatitis A 01/19/2012, 02/16/2012  . Hepatitis B 01/19/2012, 02/16/2012  . IPV 02/16/2012  . Influenza, High Dose Seasonal PF 02/17/2016, 03/04/2018, 02/10/2019  .  Influenza-Unspecified 02/02/2014, 02/16/2014  . MMR 04/04/1994  .  PFIZER(Purple Top)SARS-COV-2 Vaccination 06/11/2019, 07/02/2019, 02/21/2020  . Pneumococcal Conjugate-13 05/16/2020  . Td 09/08/2006  . Tdap 01/19/2012, 06/21/2020  . Zoster 12/15/2007    Conditions to be addressed/monitored:  Hyperlipidemia and Diabetes  Care Plan : [place]  Updates made by [name], RPH since 08/08/2020 12:00 AM    Problem: HTN, DM II, HLD   Priority: High    Long-Range Goal: Disease Management   Start Date: 07/12/2020  Recent Progress: On track  Priority: High  Note:     Current Barriers:  . Unable to independently monitor therapeutic efficacy   Pharmacist Clinical Goal(s):  [name] Over the next 90 days, patient will verbalize ability to afford treatment regimen through collaboration with PharmD and provider.   Interventions: . 1:1 collaboration with [name], MD regarding development and update of comprehensive plan of care as evidenced by provider attestation and co-signature . Inter-disciplinary care team collaboration (see longitudinal plan of care) . Comprehensive medication review performed; medication list updated in electronic medical record  Hypertension (BP goal <130/80) -Controlled -Current treatment: . Valsartan - HCTZ 160-25 mg tablet once per day  . Carvedilol 25 mg taking 1.5 tablets by mouth twice per day  . Amlodipine 5 mg tablet daily  -Current home readings: 117/68  -Current dietary habits: Limit salt  -Current exercise habits: patient reports that she is exercising a bit more but she was  -Denies hypotensive/hypertensive symptoms -Educated on BP goals and benefits of medications for prevention of heart attack, stroke and kidney damage; -Counseled to monitor BP at home at least once per day, document, and provide log at future appointments -Recommended to continue current medication  Hyperlipidemia: (LDL goal < 70) -Controlled -Current  treatment: . Pravastatin 40 mg take daily.  o Patient is on automatic refill and she has over a month left.  -Current dietary patterns: patient reports that she is going to start keeping a food log -Current exercise habits: please see diabetes for detailed information on exercise.  -Congratulated patient on LDL <70  Importance of limiting foods high in cholesterol; Exercise goal of 150 minutes per week; -Counseled on diet and exercise extensively Recommended to continue current medication  Diabetes (A1c goal <7%) -Uncontrolled -Current medications: . Trulicity 1.5 mg - inject once a week on Wednesday  o Patient assistance medication came in last week.  . Humalog 75/25 - inject 45 units at bedtime  o Patient assistance medication came in last week.  -Current home glucose readings . fasting glucose: 95 - 122  . post prandial glucose: 186-192 after eating  -Denies hypoglycemic/hyperglycemic symptoms -Current meal patterns:  . breakfast: oatmeal and boiled egg sometimes the Boost drink  . lunch: salad - with fresh vegetables   . dinner: salad with fresh vegetables  . drinks: drinking some water about 3 bottles of water  -Current exercise: has increased her steps to 2000 steps or more, she is going to increase her steps.  She walks around the house, the store or walking back and forth from the [place]. She is using an app to make sure that she is exercising and she also places her goals in the app.  -Patient reports that she has been focused using her smart watch to work out and rest.  -Patient reports that [facility] will call her and remind her to fill prescription.  -Educated on A1c and blood sugar goals; Prevention and management of hypoglycemic episodes; Benefits of routine self-monitoring of blood sugar; Carbohydrate counting and/or plate method  -  Counseled to check feet daily and get yearly eye exams -Recommended to continue current medication   Patient Goals/Self-Care  Activities . Patient will:  - take medications as prescribed target a minimum of 150 minutes of moderate intensity exercise weekly  Follow Up Plan: Telephone follow up appointment with care management team member scheduled for: 09/12/2020 The patient has been provided with contact information for the care management team and has been advised to call with any health related questions or concerns.       Medication Assistance: Humalog Mix 41/42 ml, Trulicity 3.9/5.3 ml obtained through [employer] medication assistance program.  Enrollment ends 04/2021.  Patient's preferred pharmacy is:  [facility], [address], [address] [place], [place] [address] Phone: [phone] Fax: [phone]  [id number] [place], [address] [place] [address] Phone: [phone] Fax: [phone]  Uses pill box? Yes Pt endorses 95 % compliance  We discussed: Current pharmacy is preferred with insurance plan and patient is satisfied with pharmacy services Patient decided to: Continue current medication management strategy  Care Plan and Follow Up Patient Decision:  Patient agrees to Care Plan and Follow-up.  Plan: Telephone follow up appointment with care management team member scheduled for:  09/10/2020 and The patient has been provided with contact information for the care management team and has been advised to call with any health related questions or concerns.   [name], PharmD Clinical Pharmacist [facility] [phone]      Hospital visits: None in previous 6 months  Objective:  Lab Results  Component Value Date   CREATININE 1.37 (H) 06/21/2020   BUN 22 06/21/2020   GFRNONAA 39 (L) 06/21/2020   GFRAA 45 (L) 06/21/2020   NA 138 06/21/2020   K 4.7 06/21/2020   CALCIUM 9.3 06/21/2020   CO2 19 (L)  06/21/2020   GLUCOSE 129 (H) 06/21/2020    Lab Results  Component Value Date/Time   HGBA1C 7.7 (H) 06/21/2020 11:37 AM   HGBA1C 7.3 (H) 02/07/2020 10:50 AM   MICROALBUR 30 10/05/2019 12:07 PM   MICROALBUR 10 09/30/2018 03:26 PM    Last diabetic Eye exam:  Lab Results  Component Value Date/Time   HMDIABEYEEXA No Retinopathy 12/02/2019 12:00 AM    Last diabetic Foot exam: No results found for: HMDIABFOOTEX   Lab Results  Component Value Date   CHOL 124 06/21/2020   HDL 42 06/21/2020   LDLCALC 61 06/21/2020   TRIG 117 06/21/2020   CHOLHDL 3.0 06/21/2020    Hepatic Function Latest Ref Rng & Units 06/21/2020 10/05/2019 06/16/2019  Total Protein 6.0 - 8.5 g/dL 7.3 7.4 7.0  Albumin 3.8 - 4.8 g/dL 4.0 4.3 4.1  AST 0 - 40 IU/L $Remov'14 15 14  'GLUtTP$ ALT 0 - 32 IU/L $Remov'15 13 13  'PEEzUd$ Alk Phosphatase 44 - 121 IU/L 107 98 107  Total Bilirubin 0.0 - 1.2 mg/dL 0.2 <0.2 <0.2     CBC Latest Ref Rng & Units 06/21/2020 10/05/2019 09/30/2018  WBC 3.4 - 10.8 x10E3/uL 10.5 10.6 9.3  Hemoglobin 11.1 - 15.9 g/dL 11.6 11.5 12.0  Hematocrit 34.0 - 46.6 % 36.8 36.1 36.0  Platelets 150 - 450 x10E3/uL 333 328 367    No results found for: VD25OH  Clinical ASCVD: No  The ASCVD Risk score Mikey Bussing DC Jr., et al., 2013) failed to calculate for the  following reasons:   The valid total cholesterol range is 130 to 320 mg/dL    Depression screen [facility] 2/9 06/21/2020 10/05/2019 06/16/2019  Decreased Interest 0 0 0  Down, Depressed, Hopeless 0 0 0  PHQ - 2 Score 0 0 0  Altered sleeping - - 0  Tired, decreased energy - - 0  Change in appetite - - 0  Feeling bad or failure about yourself  - - 0  Trouble concentrating - - 0  Moving slowly or fidgety/restless - - 0  Suicidal thoughts - - 0  PHQ-9 Score - - 0  Difficult doing work/chores - - Not difficult at all     Social History   Tobacco Use  Smoking Status Never Smoker  Smokeless Tobacco Never Used   BP Readings from Last 3 Encounters:  06/21/20 118/64  06/21/20 118/64   02/07/20 134/70   Pulse Readings from Last 3 Encounters:  06/21/20 77  06/21/20 77  02/07/20 68   Wt Readings from Last 3 Encounters:  06/21/20 276 lb (125.2 kg)  06/21/20 276 lb 9.6 oz (125.5 kg)  02/07/20 266 lb (120.7 kg)   BMI Readings from Last 3 Encounters:  06/21/20 48.89 kg/m  06/21/20 49.00 kg/m  02/07/20 47.12 kg/m    Assessment/Interventions: Review of patient past medical history, allergies, medications, health status, including review of consultants reports, laboratory and other test data, was performed as part of comprehensive evaluation and provision of chronic care management services.   SDOH:  (Social Determinants of Health) assessments and interventions performed: Yes SDOH Interventions   Flowsheet Row Most Recent Value  SDOH Interventions   Financial Strain Interventions --  [patient assistance application]     SDOH Screenings   Alcohol Screen: Not on file  Depression (PHQ2-9): Low Risk   . PHQ-2 Score: 0  Financial Resource Strain: Low Risk   . Difficulty of Paying Living Expenses: Not hard at all  Food Insecurity: No Food Insecurity  . Worried About Charity fundraiser in the Last Year: Never true  . Ran Out of Food in the Last Year: Never true  Housing: Not on file  Physical Activity: Insufficiently Active  . Days of Exercise per Week: 3 days  . Minutes of Exercise per Session: 30 min  Social Connections: Not on file  Stress: No Stress Concern Present  . Feeling of Stress : Not at all  Tobacco Use: Low Risk   . Smoking Tobacco Use: Never Smoker  . Smokeless Tobacco Use: Never Used  Transportation Needs: No Transportation Needs  . Lack of Transportation (Medical): No  . Lack of Transportation (Non-Medical): No    CCM Care Plan  Allergies  Allergen Reactions  . Other Other (See Comments)    NO BLOOD   . Jehovah witness    Medications Reviewed Today    Reviewed by [name], [facility] (Pharmacist) on 08/08/20 at 0856  Med List  Status: <None>  Medication Order Taking? Sig Documenting Provider Last Dose Status Informant  ACCU-CHEK AVIVA PLUS test strip [id number] Yes CHECK 4 TIMES BY INTRADERMAL ROUTE EVERY DAY [name], MD Taking Active   albuterol (PROVENTIL HFA;VENTOLIN HFA) 108 (90 Base) MCG/ACT inhaler [id number] Yes Inhale 2 puffs into the lungs every 4 (four) hours as needed for wheezing or shortness of breath.  [provider] Taking Active Self  amLODipine (NORVASC) 5 MG tablet [id number] Yes TAKE 1 TABLET BY MOUTH  DAILY Adrian Prows, MD Taking Active   aspirin EC 81 MG  tablet [id number] Yes Take 81 mg by mouth at bedtime.  [provider] Taking Active Self           Med Note Michelle Nasuti   Thu Jun 16, 2019  8:38 AM)    B Complex Vitamins (B COMPLEX-B12) TABS [id number] Yes Take 1 tablet by mouth daily. [provider] Taking Active Self           Med Note Jannifer Rodney, APRIL   Thu Oct 21, 2018  8:17 AM)    carvedilol (COREG) 25 MG tablet [id number] Yes TAKE 1 AND 1/2 TABLETS BY  MOUTH TWICE DAILY Adrian Prows, MD Taking Active   Cholecalciferol (VITAMIN D) 50 MCG (2000 UT) tablet [id number] Yes Take 2,000 Units by mouth daily. [provider] Taking Active   Chromium 1000 MCG TABS [id number] Yes Take 1,000 mcg by mouth daily.  [provider] Taking Active   Cinnamon 500 MG capsule [id number] Yes Take 500 mg by mouth daily. [provider] Taking Active Self           Med Note Jannifer Rodney, APRIL   Thu Oct 21, 2018  8:17 AM)    fluticasone (FLONASE) 50 MCG/ACT nasal spray [id number]  Place 1 spray into both nostrils daily.  Patient taking differently: Place 1 spray into both nostrils as needed for allergies.   [name], MD  Expired 02/07/20 2359   hydrALAZINE (APRESOLINE) 50 MG tablet [id number] Yes TAKE 1 TABLET BY MOUTH  TWICE DAILY Adrian Prows, MD Taking Active   Insulin Lispro Prot & Lispro (HUMALOG MIX 75/25 KWIKPEN) (75-25) 100 UNIT/ML Claiborne Rigg  [id number] Yes Inject 45 Units into the skin at bedtime. [name], MD Taking Active   nystatin (NYSTATIN) powder [id number] Yes Apply topically 3 (three) times daily as needed. As needed [name], MD Taking Active   Omega-3 Fatty Acids (FISH OIL) 500 MG CAPS [id number] Yes Take 500 mg by mouth daily. [provider] Taking Active   omeprazole (PRILOSEC) 40 MG capsule [id number] Yes Take 40 mg by mouth daily. [provider] Taking Active   pravastatin (PRAVACHOL) 40 MG tablet [id number] Yes TAKE 1 TABLET BY MOUTH  DAILY [name], MD Taking Active   Probiotic Product (PROBIOTIC PO) [id number] Yes Take by mouth. [provider] Taking Active   ROCKLATAN 0.02-0.005 % SOLN [id number] Yes SMARTSIG:1 Drop(s) In Eye(s) Every Evening [provider] Taking Active   spironolactone (ALDACTONE) 25 MG tablet [id number] Yes TAKE 1 TABLET BY MOUTH IN  THE Alfonso Patten, MD Taking Active   TRULICITY 1.5 EM/7.5QG SOPN [id number] Yes INJECT 1.5 MG INTO THE SKIN ONCE A WEEK.  Patient taking differently: Inject 1.5 mg as directed every Wednesday.   [name], MD Taking Active   valsartan-hydrochlorothiazide (DIOVAN-HCT) 160-25 MG tablet [id number] Yes TAKE 1 TABLET BY MOUTH  DAILY [name], MD Taking Active           Patient Active Problem List   Diagnosis Date Noted  . Non-ischemic cardiomyopathy ([place]) 02/18/2019  . Encounter for assessment of implantable cardioverter-defibrillator (ICD)   . Type 2 diabetes mellitus with stage 3 chronic kidney disease, with long-term current use of insulin ([place]) 03/04/2018  . 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  . Chronic combined systolic and diastolic congestive heart failure ([place]) 03/04/2018  . Chronic renal disease, stage II 03/04/2018  . Status post revision of total replacement of left knee 01/26/2018  . Unilateral primary osteoarthritis, left knee 08/18/2017  . Biventricular automatic implantable  cardioverter defibrillator in situ 12/13/2013  . ICD -  Biventricular  [employer] Dynagen X4 CRT-D Model G158 ICD in place 10/28/13 10/28/2013  . HTN (hypertension) 10/03/2013  . Hyperlipemia 10/03/2013  . LBBB (left bundle branch block) 10/03/2013  . Cardiomyopathy ([facility]) 10/03/2013  . DM (diabetes mellitus) ([facility]) 10/03/2013    Immunization History  Administered Date(s) Administered  . Fluad Quad(high Dose 65+) 02/07/2020  . Hepatitis A 01/19/2012, 02/16/2012  . Hepatitis B 01/19/2012, 02/16/2012  . IPV 02/16/2012  . Influenza, High Dose Seasonal PF 02/17/2016, 03/04/2018, 02/10/2019  . Influenza-Unspecified 02/02/2014, 02/16/2014  . MMR 04/04/1994  . PFIZER(Purple Top)SARS-COV-2 Vaccination 06/11/2019, 07/02/2019, 02/21/2020  . Pneumococcal Conjugate-13 05/16/2020  . Td 09/08/2006  . Tdap 01/19/2012, 06/21/2020  . Zoster 12/15/2007    Conditions to be addressed/monitored:  Hyperlipidemia and Diabetes  Care Plan : CCM Pharmacy Care Plan  Updates made by Harlan Stains, RPH since 08/08/2020 12:00 AM    Problem: HTN, DM II, HLD   Priority: High    Long-Range Goal: Disease Management   Start Date: 07/12/2020  Recent Progress: On track  Priority: High  Note:     Current Barriers:  . Unable to independently monitor therapeutic efficacy   Pharmacist Clinical Goal(s):  [name] Over the next 90 days, patient will verbalize ability to afford treatment regimen through collaboration with PharmD and provider.   Interventions: . 1:1 collaboration with [name], MD regarding development and update of comprehensive plan of care as evidenced by provider attestation and co-signature . Inter-disciplinary care team collaboration (see longitudinal plan of care) . Comprehensive medication review performed; medication list updated in electronic medical record  Hypertension (BP goal <130/80) -Controlled -Current treatment: . Valsartan - HCTZ 160-25 mg tablet once per day   . Carvedilol 25 mg taking 1.5 tablets by mouth twice per day  . Amlodipine 5 mg tablet daily  -Current home readings: 117/68  -Current dietary habits: Limit salt  -Current exercise habits: patient reports that she is exercising a bit more but she was  -Denies hypotensive/hypertensive symptoms -Educated on BP goals and benefits of medications for prevention of heart attack, stroke and kidney damage; -Counseled to monitor BP at home at least once per day, document, and provide log at future appointments -Recommended to continue current medication  Hyperlipidemia: (LDL goal < 70) -Controlled -Current treatment: . Pravastatin 40 mg take daily.  o Patient is on automatic refill and she has over a month left.  -Current dietary patterns: patient reports that she is going to start keeping a food log -Current exercise habits: please see diabetes for detailed information on exercise.  -Congratulated patient on LDL <70  Importance of limiting foods high in cholesterol; Exercise goal of 150 minutes per week; -Counseled on diet and exercise extensively Recommended to continue current medication  Diabetes (A1c goal <7%) -Uncontrolled -Current medications: . Trulicity 1.5 mg - inject once a week on Wednesday  o Patient assistance medication came in last week.  . Humalog 75/25 - inject 45 units at bedtime  o Patient assistance medication came in last week.  -Current home glucose readings . fasting glucose: 95 - 122  . post prandial glucose: 186-192 after eating  -Denies hypoglycemic/hyperglycemic symptoms -Current meal patterns:  . breakfast: oatmeal and boiled egg sometimes the Boost drink  . lunch: salad - with fresh vegetables   . dinner: salad with fresh vegetables  . drinks: drinking some water about 3 bottles of water  -Current exercise: has increased her steps to  2000 steps or more, she is going to increase her steps.  She walks around the house, the store or walking back and forth  from the [place]. She is using an app to make sure that she is exercising and she also places her goals in the app.  -Patient reports that she has been focused using her smart watch to work out and rest.  -Patient reports that [facility] will call her and remind her to fill prescription.  -Educated on A1c and blood sugar goals; Prevention and management of hypoglycemic episodes; Benefits of routine self-monitoring of blood sugar; Carbohydrate counting and/or plate method  -Counseled to check feet daily and get yearly eye exams -Recommended to continue current medication   Patient Goals/Self-Care Activities . Patient will:  - take medications as prescribed target a minimum of 150 minutes of moderate intensity exercise weekly  Follow Up Plan: Telephone follow up appointment with care management team member scheduled for: 09/12/2020 The patient has been provided with contact information for the care management team and has been advised to call with any health related questions or concerns.       Medication Assistance: Humalog obtained through [facility] medication assistance program.  Enrollment ends 04/2021.  Patient's preferred pharmacy is:  [facility], [address], [address] [place], [place] [address] Phone: [phone] Fax: [phone]  [id number] [place], [address] [place] [address] Phone: [phone] Fax: [phone]  Uses pill box? Yes Pt endorses 90% compliance  We discussed: Benefits of medication synchronization, packaging and delivery as well as enhanced pharmacist oversight with [employer]. Patient decided to: Continue current medication management strategy  Care Plan and Follow Up Patient Decision:  Patient agrees to Care Plan and Follow-up.  Plan: Telephone follow up appointment with care management team member scheduled for:   09/12/2020 and The patient has been provided with contact information for the care management team and has been advised to call with any health related questions or concerns.   [name], PharmD Clinical Pharmacist [facility] [phone]

## 2020-08-08 NOTE — Patient Instructions (Signed)
Visit Information It was great speaking with you today!  Please let me know if you have any questions about our visit.  Goals Addressed   None     Patient Care Plan: CCM Pharmacy Care Plan    Problem Identified: HTN, DM II, HLD   Priority: High    Long-Range Goal: Disease Management   Start Date: 07/12/2020  Recent Progress: On track  Priority: High  Note:     Current Barriers:  . Unable to independently monitor therapeutic efficacy   Pharmacist Clinical Goal(s):  [name] Over the next 90 days, patient will verbalize ability to afford treatment regimen through collaboration with PharmD and provider.   Interventions: . 1:1 collaboration with [name], MD regarding development and update of comprehensive plan of care as evidenced by provider attestation and co-signature . Inter-disciplinary care team collaboration (see longitudinal plan of care) . Comprehensive medication review performed; medication list updated in electronic medical record  Hypertension (BP goal <130/80) -Controlled -Current treatment: . Valsartan - HCTZ 160-25 mg tablet once per day  . Carvedilol 25 mg taking 1.5 tablets by mouth twice per day  . Amlodipine 5 mg tablet daily  -Current home readings: 117/68  -Current dietary habits: Limit salt  -Current exercise habits: patient reports that she is exercising a bit more but she was  -Denies hypotensive/hypertensive symptoms -Educated on BP goals and benefits of medications for prevention of heart attack, stroke and kidney damage; -Counseled to monitor BP at home at least once per day, document, and provide log at future appointments -Recommended to continue current medication  Hyperlipidemia: (LDL goal < 70) -Controlled -Current treatment: . Pravastatin 40 mg take daily.  o Patient is on automatic refill and she has over a month left.  -Current dietary patterns: patient reports that she is going to start keeping a food log -Current exercise habits:  please see diabetes for detailed information on exercise.  -Congratulated patient on LDL <70  Importance of limiting foods high in cholesterol; Exercise goal of 150 minutes per week; -Counseled on diet and exercise extensively Recommended to continue current medication  Diabetes (A1c goal <7%) -Uncontrolled -Current medications: . Trulicity 1.5 mg - inject once a week on Wednesday  o Patient assistance medication came in last week.  . Humalog 75/25 - inject 45 units at bedtime  o Patient assistance medication came in last week.  -Current home glucose readings . fasting glucose: 95 - 122  . post prandial glucose: 186-192 after eating  -Denies hypoglycemic/hyperglycemic symptoms -Current meal patterns:  . breakfast: oatmeal and boiled egg sometimes the Boost drink  . lunch: salad - with fresh vegetables   . dinner: salad with fresh vegetables  . drinks: drinking some water about 3 bottles of water  -Current exercise: has increased her steps to 2000 steps or more, she is going to increase her steps.  She walks around the house, the store or walking back and forth from the [place]. She is using an app to make sure that she is exercising and she also places her goals in the app.  -Patient reports that she has been focused using her smart watch to work out and rest.  -Patient reports that [facility] will call her and remind her to fill prescription.  -Educated on A1c and blood sugar goals; Prevention and management of hypoglycemic episodes; Benefits of routine self-monitoring of blood sugar; Carbohydrate counting and/or plate method  -Counseled to check feet daily and get yearly eye exams -Recommended to continue current  medication   Patient Goals/Self-Care Activities . Patient will:  - take medications as prescribed target a minimum of 150 minutes of moderate intensity exercise weekly  Follow Up Plan: Telephone follow up appointment with care management team member scheduled for:  09/12/2020 The patient has been provided with contact information for the care management team and has been advised to call with any health related questions or concerns.       Patient agreed to services and verbal consent obtained.   The patient verbalized understanding of instructions, educational materials, and care plan provided today and agreed to receive a mailed copy of patient instructions, educational materials, and care plan.   [name], PharmD Clinical Pharmacist [facility] [phone]

## 2020-08-08 NOTE — Progress Notes (Signed)
Virtual Visit via video call    This visit type was conducted due to national recommendations for restrictions regarding the COVID-19 Pandemic (e.g. social distancing) in an effort to limit this patient's exposure and mitigate transmission in our community.  Due to her co-morbid illnesses, this patient is at least at moderate risk for complications without adequate follow up.  This format is felt to be most appropriate for this patient at this time.  All issues noted in this document were discussed and addressed.  A limited physical exam was performed with this format.    This visit type was conducted due to national recommendations for restrictions regarding the COVID-19 Pandemic (e.g. social distancing) in an effort to limit this patient's exposure and mitigate transmission in our community.  Patients identity confirmed using two different identifiers.  This format is felt to be most appropriate for this patient at this time.  All issues noted in this document were discussed and addressed.  No physical exam was performed (except for noted visual exam findings with Video Visits).    Date:  08/08/2020   ID:  THETIS SCHWIMMER, DOB 05/23/1950, MRN [id number]  Patient Location:  Home   Provider location:   Office    Chief Complaint:  UTI symptoms   History of Present Illness:    SHAELIN LALLEY is a 70 y.o. female who presents via video conferencing for a telehealth visit today.    The patient does not have symptoms concerning for COVID-19 infection (fever, chills, cough, or new shortness of breath).   Patient dropped off her urine yesterday thinking she has a UTI. She is having some urinary symptoms and her lower back hurts.  And she is not emptying her bladder completely. She is not having a fever. She has tried OTC AZO which has helped her with her urinary symptoms a little bit.  She also had some burning in the past. The urinalysis from which she dropped off yesterday showed moderate blood,  nitrites positive and moderate leukocytes.     Past Medical History:  Diagnosis Date  . AICD (automatic cardioverter/defibrillator) present   . Arthritis   . Asthma   . Carpal tunnel syndrome, bilateral   . CKD (chronic kidney disease)   . Diabetes mellitus   . Encounter for assessment of implantable cardioverter-defibrillator (ICD)   . GERD (gastroesophageal reflux disease)   . Hyperlipemia   . Hypertension   . Obesity    morbid  . Sleep apnea    wears CPAP set at 12  . Wears glasses    Past Surgical History:  Procedure Laterality Date  . ABDOMINAL HYSTERECTOMY    . BIOPSY  03/01/2019   Procedure: BIOPSY;  Surgeon: Juanita Craver, MD;  Location: [facility];  Service: Endoscopy;;  . BREAST SURGERY  1968   breast mass excision  . CARPAL TUNNEL RELEASE Left 07/10/2017   Procedure: CARPAL TUNNEL RELEASE;  Surgeon: Ashok Pall, MD;  Location: [facility];  Service: Neurosurgery;  Laterality: Left;  left  . CARPAL TUNNEL RELEASE Right 03/19/2018   Procedure: RIGHT CARPAL TUNNEL RELEASE;  Surgeon: Ashok Pall, MD;  Location: [facility];  Service: Neurosurgery;  Laterality: Right;  right  . CERVICAL SPINE SURGERY  2006  . CHOLECYSTECTOMY    . COLONOSCOPY WITH PROPOFOL N/A 03/01/2019   Procedure: COLONOSCOPY WITH PROPOFOL;  Surgeon: Juanita Craver, MD;  Location: [facility];  Service: Endoscopy;  Laterality: N/A;  . DILATION AND CURETTAGE OF UTERUS    . ICD  IMPLANT    . JOINT REPLACEMENT    . TONSILLECTOMY  1961  . TOTAL KNEE ARTHROPLASTY Left 01/26/2018   Procedure: LEFT TOTAL KNEE ARTHROPLASTY;  Surgeon: Mcarthur Rossetti, MD;  Location: [facility];  Service: Orthopedics;  Laterality: Left;     No outpatient medications have been marked as taking for the 08/08/20 encounter (Video Visit) with Bary Castilla, NP.     Allergies:   Other   Social History   Tobacco Use  . Smoking status: Never Smoker  . Smokeless tobacco: Never Used  Vaping Use  . Vaping Use: Never used   Substance Use Topics  . Alcohol use: No  . Drug use: No     Family Hx: The patient's family history includes Heart attack in her brother and sister; Heart disease in her father; Hypertension in her mother; Stroke in her mother.  ROS:   Please see the history of present illness.    Review of Systems  Constitutional: Negative for chills and fever.  Genitourinary: Positive for dysuria, flank pain and frequency.    All other systems reviewed and are negative.   Labs/Other Tests and Data Reviewed:    Recent Labs: 06/21/2020: ALT 15; BUN 22; Creatinine, Ser 1.37; Hemoglobin 11.6; Platelets 333; Potassium 4.7; Sodium 138; TSH 1.110   Recent Lipid Panel Lab Results  Component Value Date/Time   CHOL 124 06/21/2020 11:37 AM   TRIG 117 06/21/2020 11:37 AM   HDL 42 06/21/2020 11:37 AM   CHOLHDL 3.0 06/21/2020 11:37 AM   CHOLHDL 3.8 04/22/2009 06:03 AM   LDLCALC 61 06/21/2020 11:37 AM    Wt Readings from Last 3 Encounters:  06/21/20 276 lb (125.2 kg)  06/21/20 276 lb 9.6 oz (125.5 kg)  02/07/20 266 lb (120.7 kg)     Exam:    Vital Signs:  There were no vitals taken for this visit.    Physical Exam Vitals and nursing note reviewed.  HENT:     Head: Normocephalic and atraumatic.  Pulmonary:     Effort: Pulmonary effort is normal.  Neurological:     Mental Status: She is alert and oriented to person, place, and time.  Psychiatric:        Mood and Affect: Affect normal.     ASSESSMENT & PLAN:    1) Leukocytes in urine  -will go ahead send in culture for urine  -Urine culture sent  -go ahead and treat her for UTI and wait culture to come back to see if need to change treatment based on her symptoms and history of UTI  -Instructed patient to use proper hygiene techniques to wipe from front to back while using the bathroom.  -Nitrofurantoin 100 mg x 7 days BID refill 0   2) Hematuria, unspecified type   - Instructed patient to come back in 2 weeks for a urinalysis   -Will send a referral to nephrology if needed.   Follow up: if symptoms get worse.   COVID-19 Education: The signs and symptoms of COVID-19 were discussed with the patient and how to seek care for testing (follow up with PCP or arrange E-visit).  The importance of social distancing was discussed today.  Patient Risk:   After full review of this patients clinical status, I feel that they are at least moderate risk at this time.  Time:   Today, I have spent 15 minutes/ seconds with the patient with telehealth technology discussing above diagnoses.     Medication Adjustments/Labs and Tests Ordered:  Current medicines are reviewed at length with the patient today.  Concerns regarding medicines are outlined above.   Tests Ordered: No orders of the defined types were placed in this encounter.   Medication Changes: No orders of the defined types were placed in this encounter.   Disposition:  Follow up if symptoms get worse   Signed, Bary Castilla, NP

## 2020-08-08 NOTE — Telephone Encounter (Signed)
The pt was asked did she want to schedule for a virtual appt, the pt dropped of her urine yesterday because she feels like she had a UTI.  The pt said yes to the virtual appt.

## 2020-08-10 LAB — URINE CULTURE

## 2020-08-20 ENCOUNTER — Other Ambulatory Visit: Payer: Self-pay | Admitting: Student

## 2020-08-20 DIAGNOSIS — R0602 Shortness of breath: Secondary | ICD-10-CM

## 2020-08-20 NOTE — Telephone Encounter (Signed)
Please advise patient to have labs done at [facility] in the next day or 2. She should keep appointment with [facility] next week. Will notify her when labs come it if we recommend medication adjustments.

## 2020-08-22 ENCOUNTER — Other Ambulatory Visit: Payer: Self-pay

## 2020-08-23 ENCOUNTER — Other Ambulatory Visit: Payer: Self-pay

## 2020-08-23 ENCOUNTER — Other Ambulatory Visit: Payer: Medicare Other

## 2020-08-23 DIAGNOSIS — R82998 Other abnormal findings in urine: Secondary | ICD-10-CM

## 2020-08-23 DIAGNOSIS — R0602 Shortness of breath: Secondary | ICD-10-CM

## 2020-08-24 ENCOUNTER — Ambulatory Visit (HOSPITAL_COMMUNITY)
Admission: RE | Admit: 2020-08-24 | Discharge: 2020-08-24 | Disposition: A | Discharge status: Home / Self Care | Payer: Medicare Other | Source: Ambulatory Visit | Attending: Gastroenterology | Admitting: Gastroenterology

## 2020-08-24 ENCOUNTER — Other Ambulatory Visit: Payer: Self-pay | Admitting: Cardiology

## 2020-08-24 DIAGNOSIS — R110 Nausea: Secondary | ICD-10-CM | POA: Diagnosis not present

## 2020-08-24 DIAGNOSIS — R109 Unspecified abdominal pain: Secondary | ICD-10-CM | POA: Diagnosis not present

## 2020-08-24 MED ORDER — TECHNETIUM TC 99M SULFUR COLLOID
2.0000 | Freq: Once | INTRAVENOUS | Status: AC | PRN
  Administered 2020-08-24: 2 via INTRAVENOUS

## 2020-08-27 ENCOUNTER — Other Ambulatory Visit: Payer: Self-pay

## 2020-08-27 LAB — URINE CULTURE

## 2020-08-31 ENCOUNTER — Ambulatory Visit: Payer: Medicare Other | Admitting: Cardiology

## 2020-08-31 ENCOUNTER — Encounter: Payer: Self-pay | Admitting: Cardiology

## 2020-08-31 ENCOUNTER — Other Ambulatory Visit: Payer: Self-pay

## 2020-08-31 VITALS — BP 140/82 | HR 65 | Ht 63.0 in | Wt 281.0 lb

## 2020-08-31 DIAGNOSIS — Z95810 Presence of automatic (implantable) cardiac defibrillator: Secondary | ICD-10-CM | POA: Diagnosis not present

## 2020-08-31 DIAGNOSIS — Z4502 Encounter for adjustment and management of automatic implantable cardiac defibrillator: Secondary | ICD-10-CM | POA: Diagnosis not present

## 2020-08-31 DIAGNOSIS — I428 Other cardiomyopathies: Secondary | ICD-10-CM | POA: Diagnosis not present

## 2020-08-31 DIAGNOSIS — E1122 Type 2 diabetes mellitus with diabetic chronic kidney disease: Secondary | ICD-10-CM

## 2020-08-31 DIAGNOSIS — N1832 Chronic kidney disease, stage 3b: Secondary | ICD-10-CM | POA: Diagnosis not present

## 2020-08-31 DIAGNOSIS — I10 Essential (primary) hypertension: Secondary | ICD-10-CM

## 2020-08-31 DIAGNOSIS — Z794 Long term (current) use of insulin: Secondary | ICD-10-CM | POA: Diagnosis not present

## 2020-08-31 DIAGNOSIS — I5042 Chronic combined systolic (congestive) and diastolic (congestive) heart failure: Secondary | ICD-10-CM

## 2020-08-31 DIAGNOSIS — R0602 Shortness of breath: Secondary | ICD-10-CM | POA: Diagnosis not present

## 2020-08-31 NOTE — Progress Notes (Signed)
Primary Physician/Referring:  [name], MD  Patient ID: Yvonne Miller, female    DOB: December 02, 1950, 70 y.o.   MRN: [id number]  Chief Complaint  Patient presents with  . Congestive Heart Failure  . Hypertension  . Follow-up    1 year    HPI:    Yvonne Miller  is a 70 y.o. 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 She also has morbid obesity, OSA not on CPAP recently, hypertension, DM with stage 3b CKD, hyperlipidemia, degenerative joint disease and recent diagnosis of diabetic gastroparesis.  She presents to the office for 59-month follow-up, states that she has been having worsening dyspnea, has been gaining weight, denies PND or orthopnea but states that she is having frequent stools and also frequent UTI due to frequent stools.  She had also called our office last week stating that she has been having shortness of breath.  No chest pain or palpitations, no dizziness or syncope.  Past Medical History:  Diagnosis Date  . AICD (automatic cardioverter/defibrillator) present   . Arthritis   . Asthma   . Carpal tunnel syndrome, bilateral   . CKD (chronic kidney disease)   . Diabetes mellitus   . Encounter for assessment of implantable cardioverter-defibrillator (ICD)   . GERD (gastroesophageal reflux disease)   . Hyperlipemia   . Hypertension   . Obesity    morbid  . Sleep apnea    wears CPAP set at 12  . Wears glasses    Past Surgical History:  Procedure Laterality Date  . ABDOMINAL HYSTERECTOMY    . BIOPSY  03/01/2019   Procedure: BIOPSY;  Surgeon: Juanita Craver, MD;  Location: [facility];  Service: Endoscopy;;  . BREAST SURGERY  1968   breast mass excision  . CARPAL TUNNEL RELEASE Left 07/10/2017   Procedure: CARPAL TUNNEL RELEASE;  Surgeon: Ashok Pall, MD;  Location: [place];  Service: Neurosurgery;  Laterality: Left;   left  . CARPAL TUNNEL RELEASE Right 03/19/2018   Procedure: RIGHT CARPAL TUNNEL RELEASE;  Surgeon: Ashok Pall, MD;  Location: [place];  Service: Neurosurgery;  Laterality: Right;  right  . CERVICAL SPINE SURGERY  2006  . CHOLECYSTECTOMY    . COLONOSCOPY WITH PROPOFOL N/A 03/01/2019   Procedure: COLONOSCOPY WITH PROPOFOL;  Surgeon: Juanita Craver, MD;  Location: [facility];  Service: Endoscopy;  Laterality: N/A;  . DILATION AND CURETTAGE OF UTERUS    . ICD IMPLANT    . JOINT REPLACEMENT    . TONSILLECTOMY  1961  . TOTAL KNEE ARTHROPLASTY Left 01/26/2018   Procedure: LEFT TOTAL KNEE ARTHROPLASTY;  Surgeon: Mcarthur Rossetti, MD;  Location: [place];  Service: Orthopedics;  Laterality: Left;   Family History  Problem Relation Age of Onset  . Hypertension Mother   . Stroke Mother   . Heart disease Father   . Heart attack Sister   . Heart attack Brother     Social History   Tobacco Use  . Smoking status: Never Smoker  . Smokeless tobacco: Never Used  Substance Use Topics  . Alcohol use: No   Marital Status: Married  ROS  Review of Systems  Constitutional: Positive for malaise/fatigue. Negative for chills, decreased appetite and weight gain.  Cardiovascular: Positive for dyspnea on exertion and leg swelling (lymphedema left leg and wears support stocking). Negative for syncope.  Respiratory: Positive for snoring (sleep apnea not using  CPAP).   Endocrine: Negative for cold intolerance.  Hematologic/Lymphatic: Does not bruise/bleed easily.  Musculoskeletal: Positive for arthritis (bilateral knee). Negative for joint swelling.  Gastrointestinal: Positive for change in bowel habit. Negative for abdominal pain, anorexia, hematochezia and melena.  Genitourinary: Positive for dysuria.  Neurological: Negative for headaches and light-headedness.   Objective  Blood pressure 140/82, pulse 65, height 5\' 3"  (1.6 m), weight 281 lb (127.5 kg), SpO2 97 %.  Vitals with BMI 08/31/2020  06/21/2020 06/21/2020  Height 5\' 3"  5\' 3"  5\' 3"   Weight 281 lbs 276 lbs 276 lbs 10 oz  BMI 49.79 24.4 62.86  Systolic 381 771 165  Diastolic 82 64 64  Pulse 65 77 77     Physical Exam Constitutional:      Appearance: She is well-developed.     Comments: Morbidly Obese   HENT:     Head: Atraumatic.  Eyes:     Conjunctiva/sclera: Conjunctivae normal.  Neck:     Thyroid: No thyromegaly.     Vascular: No JVD.     Comments: Short neck and difficult to evaluate JVP  Cardiovascular:     Rate and Rhythm: Normal rate and regular rhythm.     Pulses: Intact distal pulses.          Carotid pulses are 2+ on the right side and 2+ on the left side.    Heart sounds: No murmur heard. No gallop.      Comments: Femoral and popliteal pulse difficult to feel due to patient's body habitus.   Edema: Non pitting left leg below knee present. Trace right leg edema.   Pulmonary:     Effort: Pulmonary effort is normal. No accessory muscle usage or respiratory distress.     Breath sounds: Normal breath sounds.  Abdominal:     General: Bowel sounds are normal.     Palpations: Abdomen is soft.     Comments: Obese. Pannus present   Musculoskeletal:        General: Normal range of motion.     Cervical back: Neck supple.  Skin:    General: Skin is warm and dry.  Neurological:     Mental Status: She is alert.    Laboratory examination:   Recent Labs    10/05/19 1014 02/07/20 1050 06/21/20 1137 08/31/20 1036  NA 135 137 138 142  K 5.2 4.8 4.7 5.5*  CL 98 100 103 104  CO2 22 22 19* 24  GLUCOSE 120* 74 129* 136*  BUN 45* 27 22 27   CREATININE 1.94* 1.38* 1.37* 1.57*  CALCIUM 9.3 9.5 9.3 9.6  GFRNONAA 26* 39* 39*  --   GFRAA 30* 45* 45*  --    estimated creatinine clearance is 42.8 mL/min (A) (by C-G formula based on SCr of 1.57 mg/dL (H)).  CMP Latest Ref Rng & Units 08/31/2020 06/21/2020 02/07/2020  Glucose 65 - 99 mg/dL 136(H) 129(H) 74  BUN 8 - 27 mg/dL 27 22 27   Creatinine 0.57 - 1.00  mg/dL 1.57(H) 1.37(H) 1.38(H)  Sodium 134 - 144 mmol/L 142 138 137  Potassium 3.5 - 5.2 mmol/L 5.5(H) 4.7 4.8  Chloride 96 - 106 mmol/L 104 103 100  CO2 20 - 29 mmol/L 24 19(L) 22  Calcium 8.7 - 10.3 mg/dL 9.6 9.3 9.5  Total Protein 6.0 - 8.5 g/dL - 7.3 -  Total Bilirubin 0.0 - 1.2 mg/dL - 0.2 -  Alkaline Phos 44 - 121 IU/L - 107 -  AST 0 - 40 IU/L -  14 -  ALT 0 - 32 IU/L - 15 -   CBC Latest Ref Rng & Units 06/21/2020 10/05/2019 09/30/2018  WBC 3.4 - 10.8 x10E3/uL 10.5 10.6 9.3  Hemoglobin 11.1 - 15.9 g/dL 11.6 11.5 12.0  Hematocrit 34.0 - 46.6 % 36.8 36.1 36.0  Platelets 150 - 450 x10E3/uL 333 328 367   Lipid Panel     Component Value Date/Time   CHOL 124 06/21/2020 1137   TRIG 117 06/21/2020 1137   HDL 42 06/21/2020 1137   CHOLHDL 3.0 06/21/2020 1137   CHOLHDL 3.8 04/22/2009 0603   VLDL 45 (H) 04/22/2009 0603   LDLCALC 61 06/21/2020 1137   HEMOGLOBIN A1C Lab Results  Component Value Date   HGBA1C 7.7 (H) 06/21/2020   MPG 165.68 01/15/2018   TSH Recent Labs    06/21/20 1137  TSH 1.110    Medications and allergies   Allergies  Allergen Reactions  . Other Other (See Comments)    NO BLOOD   . Jehovah witness     Current Outpatient Medications  Medication Instructions  . ACCU-CHEK AVIVA PLUS test strip CHECK 4 TIMES BY INTRADERMAL ROUTE EVERY DAY  . albuterol (PROVENTIL HFA;VENTOLIN HFA) 108 (90 Base) MCG/ACT inhaler 2 puffs, Inhalation, Every 4 hours PRN  . amLODipine (NORVASC) 5 MG tablet TAKE 1 TABLET BY MOUTH  DAILY  . aspirin EC 81 mg, Oral, Daily at bedtime  . B Complex Vitamins (B COMPLEX-B12) TABS 1 tablet, Oral, Daily  . carvedilol (COREG) 25 MG tablet TAKE 1 AND 1/2 TABLETS BY  MOUTH TWICE DAILY  . Chromium 1,000 mcg, Oral, Daily  . Cinnamon 500 mg, Oral, Daily  . Fish Oil 500 mg, Oral, Daily  . fluticasone (FLONASE) 50 MCG/ACT nasal spray 1 spray, Each Nare, Daily  . furosemide (LASIX) 40 mg, Oral, BH-each morning, One tab twice daily for 1 week  .  hydrALAZINE (APRESOLINE) 50 MG tablet TAKE 1 TABLET BY MOUTH  TWICE DAILY  . Insulin Lispro Prot & Lispro (HUMALOG MIX 75/25 KWIKPEN) (75-25) 100 UNIT/ML Kwikpen 45 Units, Subcutaneous, Nightly  . nystatin (NYSTATIN) powder Topical, 3 times daily PRN, As needed  . omeprazole (PRILOSEC) 40 mg, Oral, Daily  . pravastatin (PRAVACHOL) 40 MG tablet TAKE 1 TABLET BY MOUTH  DAILY  . Probiotic Product (PROBIOTIC PO) Oral  . ROCKLATAN 0.02-0.005 % SOLN SMARTSIG:1 Drop(s) In Eye(s) Every Evening  . TRULICITY 1.5 JE/5.6DJ SOPN INJECT 1.5 MG INTO THE SKIN ONCE A WEEK.  . valsartan-hydrochlorothiazide (DIOVAN-HCT) 160-25 MG tablet 1 tablet, Oral, Daily  . Vitamin D 2,000 Units, Oral, Daily   Radiology:   No results found.  Cardiac Studies:   Sleep study 2010 (sleep apnea-has a CPAP but doesn't use it every night).  Coronary Angiography  R+ L 03/04/11: and in 2010 Normal coronary arteries. Moderate pulmonary hypertension.  Echocardiogram [07/06/2014]:  1. Left ventricle cavity is minimally dilated. Mild concentric hypertrophy of the left ventricle. Normal global wall motion. Doppler evidence of grade I (impaired) diastolic dysfunction. Normal systolic function. Calculated EF 55%. 2. Left atrial cavity is mild to moderately dilated. 3. Trace mitral regurgitation. 4. Mild tricuspid regurgitation. No evidence of pulmonary hypertension. 5. c.f. echo. of 09/12/2013, there is remarkable improvement in LV syst. function, EF has improved from 28% to 55%.  Scheduled  In office ICD 08/31/20:  Single (S)/Dual (D)/BV (M) B Presenting ASBP Pacer dependant: No. Underlying NSR @ 70/min. AP 0%, BP 100.% AMS Episodes 0. HVR 0.  Longevity 3.5 Years/Voltage.  Lead  measurements: Stable Histogram: Low (L)/normal (N)/high (H)  Normal Patient activity Normal. Thoracic impedance: NA  Observations: Normal ICD function.  Changes: None   Scheduled Remote ICD check  04/19/2020:  No HVR episodes. Health trends  (patient activity, heart rate variability, average heart rates) are stable. The review of the implantable cardiac monitoring data remains stable. Battery longevity is 3.5 years. RA pacing is 0 %, RV pacing is 99 %, and LV pacing is 100 %.  EKG   EKG 08/31/2020: Atrially sensed and biventricular paced rhythm.  No further analysis.  No significant change from 4/29/202  Assessment     ICD-10-CM   1. Encounter for assessment of implantable cardioverter-defibrillator (ICD)  Z45.02   2. Non-ischemic cardiomyopathy ([place])  I42.8   3. Chronic combined systolic and diastolic heart failure (HCC)  I50.42 EKG 12-Lead    furosemide (LASIX) 40 MG tablet    Basic metabolic panel    Magnesium    Brain natriuretic peptide  4. Essential hypertension  I10   5. ICD: Biventricular  [employer] Dynagen X4 CRT-D Model G158 ICD in place 10/28/13  Z95.810   6. Type 2 diabetes mellitus with stage 3b chronic kidney disease, with long-term current use of insulin ([place])  E11.22    N18.32    Z79.4     Orders Placed This Encounter  Procedures  . Basic metabolic panel    Standing Status:   Future    Standing Expiration Date:   09/01/2021  . Magnesium    Standing Status:   Future    Standing Expiration Date:   09/01/2021  . Brain natriuretic peptide    Standing Status:   Future    Standing Expiration Date:   09/01/2021  . EKG 12-Lead    Meds ordered this encounter  Medications  . furosemide (LASIX) 40 MG tablet    Sig: Take 1 tablet (40 mg total) by mouth every morning. One tab twice daily for 1 week    Dispense:  60 tablet    Refill:  2    Medications Discontinued During This Encounter  Medication Reason  . spironolactone (ALDACTONE) 25 MG tablet Discontinued by provider    Recommendations:   Yvonne Miller  is a 70 y.o. African-American with nonischemic dilated cardiomyopathy S/P bi-V ICD implantation at [facility] in 2015, since then last echocardiogram at revealed normal LVEF  in 2016. She also has morbid obesity, OSA not on CPAP recently, hypertension, DM with stage 3b CKD, hyperlipidemia, degenerative joint disease and recent diagnosis of diabetic gastroparesis.  She presents to the office for 2-month follow-up, states that she has been having worsening dyspnea, has been gaining weight, denies PND or orthopnea but states that she is having frequent stools and also frequent UTI due to frequent stools.  Patient's ICD was evaluated, normal function.  Patient is in acute decompensated heart failure, I reviewed her BMP, renal function is slightly worsening over the past 2 months.  We will discontinue spironolactone, I will add furosemide 40 mg twice daily for 1 week followed by 40 mg once a day.  We will repeat BMP and also BNP in 2 weeks prior to her seeing me back in 3 weeks.  Suspect her acute decompensation is probably related to recent gastroparesis from diabetes and frequent stools and frequent UTI.    Adrian Prows, MD, [facility] 09/01/2020, 4:35 PM Office: [phone] Pager: [phone]

## 2020-09-01 LAB — BASIC METABOLIC PANEL
BUN/Creatinine Ratio: 17 (ref 12–28)
BUN: 27 mg/dL (ref 8–27)
CO2: 24 mmol/L (ref 20–29)
Calcium: 9.6 mg/dL (ref 8.7–10.3)
Chloride: 104 mmol/L (ref 96–106)
Creatinine, Ser: 1.57 mg/dL — ABNORMAL HIGH (ref 0.57–1.00)
Glucose: 136 mg/dL — ABNORMAL HIGH (ref 65–99)
Potassium: 5.5 mmol/L — ABNORMAL HIGH (ref 3.5–5.2)
Sodium: 142 mmol/L (ref 134–144)
eGFR: 35 mL/min/{1.73_m2} — ABNORMAL LOW (ref >59)

## 2020-09-01 LAB — BRAIN NATRIURETIC PEPTIDE: BNP: 44.3 pg/mL (ref 0.0–100.0)

## 2020-09-01 MED ORDER — FUROSEMIDE 40 MG PO TABS: 40.0000 mg | ORAL_TABLET | ORAL | 2 refills | Status: DC

## 2020-09-03 ENCOUNTER — Ambulatory Visit (INDEPENDENT_AMBULATORY_CARE_PROVIDER_SITE_OTHER): Payer: Medicare Other

## 2020-09-03 ENCOUNTER — Telehealth: Payer: Medicare Other

## 2020-09-03 DIAGNOSIS — Z794 Long term (current) use of insulin: Secondary | ICD-10-CM | POA: Diagnosis not present

## 2020-09-03 DIAGNOSIS — N1832 Chronic kidney disease, stage 3b: Secondary | ICD-10-CM

## 2020-09-03 DIAGNOSIS — E785 Hyperlipidemia, unspecified: Secondary | ICD-10-CM

## 2020-09-03 DIAGNOSIS — I5032 Chronic diastolic (congestive) heart failure: Secondary | ICD-10-CM | POA: Diagnosis not present

## 2020-09-03 DIAGNOSIS — E1122 Type 2 diabetes mellitus with diabetic chronic kidney disease: Secondary | ICD-10-CM | POA: Diagnosis not present

## 2020-09-03 DIAGNOSIS — I10 Essential (primary) hypertension: Secondary | ICD-10-CM | POA: Diagnosis not present

## 2020-09-03 DIAGNOSIS — N183 Chronic kidney disease, stage 3 unspecified: Secondary | ICD-10-CM | POA: Diagnosis not present

## 2020-09-03 DIAGNOSIS — E7800 Pure hypercholesterolemia, unspecified: Secondary | ICD-10-CM

## 2020-09-07 ENCOUNTER — Telehealth: Payer: Medicare Other

## 2020-09-07 ENCOUNTER — Other Ambulatory Visit: Payer: Self-pay | Admitting: Internal Medicine

## 2020-09-07 ENCOUNTER — Ambulatory Visit: Payer: Self-pay

## 2020-09-07 DIAGNOSIS — Z794 Long term (current) use of insulin: Secondary | ICD-10-CM

## 2020-09-07 DIAGNOSIS — I5032 Chronic diastolic (congestive) heart failure: Secondary | ICD-10-CM

## 2020-09-07 DIAGNOSIS — N183 Chronic kidney disease, stage 3 unspecified: Secondary | ICD-10-CM

## 2020-09-07 DIAGNOSIS — I10 Essential (primary) hypertension: Secondary | ICD-10-CM

## 2020-09-07 DIAGNOSIS — E785 Hyperlipidemia, unspecified: Secondary | ICD-10-CM

## 2020-09-07 DIAGNOSIS — N1832 Chronic kidney disease, stage 3b: Secondary | ICD-10-CM

## 2020-09-07 DIAGNOSIS — E1122 Type 2 diabetes mellitus with diabetic chronic kidney disease: Secondary | ICD-10-CM

## 2020-09-07 MED ORDER — CEPHALEXIN 500 MG PO CAPS: ORAL_CAPSULE | ORAL | 0 refills | Status: DC

## 2020-09-10 NOTE — Patient Instructions (Signed)
Goals Addressed    . Impaired Urinary Elimination - Reoccurring UTI's   On track    Timeframe:  Short-Term Goal Priority:  High Start Date:  09/03/20                           Expected End Date:  12/04/20   Next Follow up date:  10/15/20  Self Care Activities:  . Continue to keep all scheduled follow up appointments . Take medications as directed  . Let your healthcare team know if you are unable to take your medications . Call your pharmacy for refills at least 7 days prior to running out of medication . Increase water intake to 64 oz daily unless otherwise directed . Use perineal hygiene as discussed  . Complete all oral antibiotics as prescribed for treatment of UTI . Report reoccurring symptoms of UTI promptly for early diagnosis/treatment Patient Goals: - resolve reoccurring UTI's

## 2020-09-10 NOTE — Patient Instructions (Signed)
Goals Addressed    Other   .  Impaired Urinary Elimination - Reoccurring UTI's   On track     Timeframe:  Short-Term Goal Priority:  High Start Date:  09/03/20                           Expected End Date:  12/04/20   Next Follow up date:  10/15/20  Self Care Activities:  . Continue to keep all scheduled follow up appointments . Take medications as directed  . Let your healthcare team know if you are unable to take your medications . Call your pharmacy for refills at least 7 days prior to running out of medication . Increase water intake to 64 oz daily unless otherwise directed . Use perineal hygiene as discussed  . Complete all oral antibiotics as prescribed for treatment of UTI . Report reoccurring symptoms of UTI promptly for early diagnosis/treatment Patient Goals: - resolve reoccurring UTI's                     .  Monitor and Manage My Blood Sugar-Diabetes Type 2   On track     Timeframe:  Long-Range Goal Priority:  High Start Date:                             Expected End Date:                       Follow Up Date 08/08/2020   - check blood sugar at prescribed times - take the blood sugar log to all doctor visits - take the blood sugar meter to all doctor visits    Why is this important?    Checking your blood sugar at home helps to keep it from getting very high or very low.   Writing the results in a diary or log helps the doctor know how to care for you.   Your blood sugar log should have the time, date and the results.   Also, write down the amount of insulin or other medicine that you take.   Other information, like what you ate, exercise done and how you were feeling, will also be helpful.     Notes:     [name]  Symptom Exacerbation Prevented or Minimized - Heart Failure   On track     Timeframe:  Long-Range Goal Priority:  High Start Date:  09/03/20                           Expected End Date:  12/04/20   Next Follow up date:  10/15/20  Self-Care Activities:   Takes Heart Failure Medications as prescribed Weighs daily and record (notifying MD of 3 lb weight gain over night or 5 lb in a week) Verbalizes understanding of and follows CHF Action Plan Adheres to low sodium diet  Patient Goals:  - Take Heart Failure Medications as prescribed - Weigh daily and record (notify MD with 3 lb weight gain over night or 5 lb in a week) - Follow CHF Action Plan - Adhere to low sodium diet - develop a rescue plan - follow rescue plan if symptoms flare-up - know when to call the doctor - track symptoms and what helps feel better or worse                     .  Track and Manage My Blood Pressure-Hypertension   On track     Timeframe:  Long-Range Goal Priority:  High Start Date:  09/03/20                          Expected End Date: 09/03/21                     Follow Up Date: 10/15/20    - check blood pressure 3 times per week - check blood pressure daily - check blood pressure weekly - choose a place to take my blood pressure (home, clinic or office, retail store) - write blood pressure results in a log or diary    Why is this important?    You won't feel high blood pressure, but it can still hurt your blood vessels.   High blood pressure can cause heart or kidney problems. It can also cause a stroke.   Making lifestyle changes like losing a Yvonne Miller weight or eating less salt will help.   Checking your blood pressure at home and at different times of the day can help to control blood pressure.   If the doctor prescribes medicine remember to take it the way the doctor ordered.   Call the office if you cannot afford the medicine or if there are questions about it.     Notes:

## 2020-09-10 NOTE — Chronic Care Management (AMB) (Signed)
Chronic Care Management   CCM RN Visit Note  09/03/2020 Name: Yvonne Miller MRN: [id number] DOB: 09-08-1950  Subjective: Yvonne Miller is a 70 y.o. year old female who is a primary care patient of [name], MD. The care management team was consulted for assistance with disease management and care coordination needs.    Engaged with patient by telephone for follow up visit in response to provider referral for case management and/or care coordination services.   Consent to Services:  The patient was given information about Chronic Care Management services, agreed to services, and gave verbal consent prior to initiation of services.  Please see initial visit note for detailed documentation.   Patient agreed to services and verbal consent obtained.   Assessment: Review of patient past medical history, allergies, medications, health status, including review of consultants reports, laboratory and other test data, was performed as part of comprehensive evaluation and provision of chronic care management services.   SDOH (Social Determinants of Health) assessments and interventions performed:  Yes, no acute needs   CCM Care Plan  Allergies  Allergen Reactions  . Other Other (See Comments)    NO BLOOD   . Jehovah witness    Outpatient Encounter Medications as of 09/03/2020  Medication Sig  . Probiotic Product (PROBIOTIC PO) Take by mouth.  . TRULICITY 1.5 HM/0.9OB SOPN INJECT 1.5 MG INTO THE SKIN ONCE A WEEK. (Patient taking differently: Inject 1.5 mg as directed every Wednesday.)  . ACCU-CHEK AVIVA PLUS test strip CHECK 4 TIMES BY INTRADERMAL ROUTE EVERY DAY  . albuterol (PROVENTIL HFA;VENTOLIN HFA) 108 (90 Base) MCG/ACT inhaler Inhale 2 puffs into the lungs every 4 (four) hours as needed for wheezing or shortness of breath.   [name] amLODipine (NORVASC) 5 MG tablet TAKE 1 TABLET BY MOUTH  DAILY  . aspirin EC 81 MG tablet Take 81 mg by mouth at bedtime.   . B Complex Vitamins (B  COMPLEX-B12) TABS Take 1 tablet by mouth daily.  . carvedilol (COREG) 25 MG tablet TAKE 1 AND 1/2 TABLETS BY  MOUTH TWICE DAILY (Patient taking differently: Take by mouth 2 (two) times daily with a meal. Taking 1.5 BID)  . Cholecalciferol (VITAMIN D) 50 MCG (2000 UT) tablet Take 2,000 Units by mouth daily.  . Chromium 1000 MCG TABS Take 1,000 mcg by mouth daily.   . Cinnamon 500 MG capsule Take 500 mg by mouth daily.  . fluticasone (FLONASE) 50 MCG/ACT nasal spray Place 1 spray into both nostrils daily. (Patient taking differently: Place 1 spray into both nostrils as needed for allergies.)  . furosemide (LASIX) 40 MG tablet Take 1 tablet (40 mg total) by mouth every morning. One tab twice daily for 1 week  . hydrALAZINE (APRESOLINE) 50 MG tablet TAKE 1 TABLET BY MOUTH  TWICE DAILY  . Insulin Lispro Prot & Lispro (HUMALOG MIX 75/25 KWIKPEN) (75-25) 100 UNIT/ML Kwikpen Inject 45 Units into the skin at bedtime.  [name] nystatin (NYSTATIN) powder Apply topically 3 (three) times daily as needed. As needed  . Omega-3 Fatty Acids (FISH OIL) 500 MG CAPS Take 500 mg by mouth daily.  [name] omeprazole (PRILOSEC) 40 MG capsule Take 40 mg by mouth daily.  . pravastatin (PRAVACHOL) 40 MG tablet TAKE 1 TABLET BY MOUTH  DAILY  . ROCKLATAN 0.02-0.005 % SOLN SMARTSIG:1 Drop(s) In Eye(s) Every Evening  . valsartan-hydrochlorothiazide (DIOVAN-HCT) 160-25 MG tablet TAKE 1 TABLET BY MOUTH  DAILY   No facility-administered encounter medications on file as of 09/03/2020.  Patient Active Problem List   Diagnosis Date Noted  . Non-ischemic cardiomyopathy (HCC) 02/18/2019  . Encounter for assessment of implantable cardioverter-defibrillator (ICD)   . Type 2 diabetes mellitus with stage 3 chronic kidney disease, with long-term current use of insulin (HCC) 03/04/2018  . 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  . Chronic combined systolic and diastolic congestive heart failure (HCC) 03/04/2018  . Chronic renal disease,  stage II 03/04/2018  . Status post revision of total replacement of left knee 01/26/2018  . Unilateral primary osteoarthritis, left knee 08/18/2017  . Biventricular automatic implantable cardioverter defibrillator in situ 12/13/2013  . ICD -  Biventricular  [employer] Dynagen X4 CRT-D Model G158 ICD in place 10/28/13 10/28/2013  . HTN (hypertension) 10/03/2013  . Hyperlipemia 10/03/2013  . LBBB (left bundle branch block) 10/03/2013  . Cardiomyopathy (HCC) 10/03/2013  . DM (diabetes mellitus) (HCC) 10/03/2013    Conditions to be addressed/monitored:DM type 2, HTN, HLD, CKD, CHF  Care Plan : Chronic Kidney (Adult)  Updates made by Riley Churches, RN since 09/10/2020 12:00 AM    Problem: Disease Progression   Priority: Medium    Long-Range Goal: Disease Progression Prevented or Minimized   Start Date: 09/03/2020  Expected End Date: 09/03/2021  This Visit's Progress: On track  Priority: Medium  Note:   Current Barriers:   Ineffective Self Health Maintenance  Clinical Goal(s):  [name] Collaboration with [name], MD regarding development and update of comprehensive plan of care as evidenced by provider attestation and co-signature . Inter-disciplinary care team collaboration (see longitudinal plan of care)  patient will work with care management team to address care coordination and chronic disease management needs related to Disease Management  Educational Needs  Care Coordination  Medication Management and Education  Psychosocial Support   Interventions:  09/03/20 completed call with patient   Evaluation of current treatment plan related to CKD Stage III , self-management and patient's adherence to plan as established by provider.  Collaboration with [name], MD regarding development and update of comprehensive plan of care as evidenced by provider attestation       and co-signature  Inter-disciplinary care team collaboration (see longitudinal plan of  care) . Provided education to patient about basic disease process related to CKD  . Review of patient status, including review of consultants reports, relevant laboratory and other test results, and medications completed. . Reviewed medications with patient and discussed importance of medication adherence . Educated patient on dietary recommendations; Educated on the importance of drinking at least 64 oz of water daily unless otherwise directed   Discussed plans with patient for ongoing care management follow up and provided patient with direct contact information for care management team Self Care Activities:  . Continue to adhere to MD recommendations for CKD  . Continue to keep all scheduled follow up appointments . Take medications as directed  . Let your healthcare team know if you are unable to take your medications . Call your pharmacy for refills at least 7 days prior to running out of medication . Increase your water intake unless otherwise directed . Review mailed printed educational materials related to Kidney disease Patient Goals: - maintain adequate kidney function   Follow Up Plan: Telephone follow up appointment with care management team member scheduled for: 10/15/20    Care Plan : Heart Failure (Adult)  Updates made by Riley Churches, RN since 09/10/2020 12:00 AM    Problem: Symptom Exacerbation (Heart Failure)   Priority: High  Long-Range Goal: Symptom Exacerbation Prevented or Minimized   Start Date: 09/03/2020  Expected End Date: 12/04/2020  This Visit's Progress: On track  Priority: High  Note:   Current Barriers:  [name] Knowledge deficits related to basic heart failure pathophysiology and self care management . Lack of scale in home . Financial strain Nurse Case Manager Clinical Goal(s):   patient will weigh self daily and record  patient will verbalize understanding of Heart Failure Action Plan and when to call doctor  patient will take all Heart Failure  mediations as prescribed Interventions:  09/03/20 call completed with patient  . Collaboration with [name], MD regarding development and update of comprehensive plan of care as evidenced by provider attestation and co-signature . Inter-disciplinary care team collaboration (see longitudinal plan of care) . Reviewed Cardiology f/u completed with Dr. Rockwell Alexandria on 08/31/20 with the following Assessment/Plan noted: . "Patient's ICD was evaluated, normal function.  Patient is in acute decompensated heart failure, I reviewed her BMP, renal function is slightly worsening over the past 2 months.  We will discontinue spironolactone, I will add furosemide 40 mg twice daily for 1 week followed by 40 mg once a day.  We will repeat BMP and also BNP in 2 weeks prior to her seeing me back in 3 weeks.  Suspect her acute decompensation is probably related to recent gastroparesis from diabetes and frequent stools and frequent UTI." . Basic overview and discussion of pathophysiology of Heart Failure . Provided written and verbal education on low sodium diet . Reviewed Heart Failure Action Plan in depth and provided written copy . Assessed for scales in home . Discussed importance of daily weight . Reviewed role of diuretics in prevention of fluid overload . Reviewed scheduled/upcoming provider appointments including: next Cardiology follow up appointment scheduled for 09/21/20 $[id number]$ :37 AM  . Discussed plans with patient for ongoing care management follow up and provided patient with direct contact information for care management team Self-Care Activities:  Takes Heart Failure Medications as prescribed Weighs daily and record (notifying MD of 3 lb weight gain over night or 5 lb in a week) Verbalizes understanding of and follows CHF Action Plan Adheres to low sodium diet  Patient Goals:  - Take Heart Failure Medications as prescribed - Weigh daily and record (notify MD with 3 lb weight gain over night or 5 lb in a  week) - Follow CHF Action Plan - Adhere to low sodium diet - develop a rescue plan - follow rescue plan if symptoms flare-up - know when to call the doctor - track symptoms and what helps feel better or worse  Follow Up Plan: Telephone follow up appointment with care management team member scheduled for: 10/15/20   Care Plan : Hypertension (Adult)  Updates made by Lynne Logan, RN since 09/10/2020 12:00 AM    Problem: Hypertension (Hypertension)   Priority: Medium    Long-Range Goal: Hypertension Monitored   Start Date: 09/03/2020  Expected End Date: 09/03/2021  This Visit's Progress: On track  Priority: Medium  Note:   Objective:  . Last practice recorded BP readings:  BP Readings from Last 3 Encounters:  08/31/20 140/82  06/21/20 118/64  06/21/20 118/64 .   [name] Most recent eGFR/CrCl:  Lab Results  Component Value Date   EGFR 35 (L) 08/31/2020 .    No components found for: CRCL Current Barriers:  [name] Knowledge Deficits related to basic understanding of hypertension pathophysiology and self care management . Knowledge Deficits related to understanding of medications  prescribed for management of hypertension Case Manager Clinical Goal(s):  . patient will demonstrate improved adherence to prescribed treatment plan for hypertension as evidenced by taking all medications as prescribed, monitoring and recording blood pressure as directed, adhering to low sodium/DASH diet Interventions:  52/22 call completed with patient  . Collaboration with [name], MD regarding development and update of comprehensive plan of care as evidenced by provider attestation and co-signature . Inter-disciplinary care team collaboration (see longitudinal plan of care) . Advised patient, providing education and rationale, to monitor blood pressure daily and record, calling PCP for findings outside established parameters.  . Evaluation of current treatment plan related to hypertension self management and  patient's adherence to plan as established by provider. . Provided education to patient re: stroke prevention, s/s of heart attack and stroke, DASH diet, complications of uncontrolled blood pressure . Reviewed medications with patient and discussed importance of compliance . Discussed plans with patient for ongoing care management follow up and provided patient with direct contact information for care management team Self-Care Activities: - Self administers medications as prescribed Attends all scheduled provider appointments Calls provider office for new concerns, questions, or BP outside discussed parameters Checks BP and records as discussed Follows a low sodium diet/DASH diet Patient Goals: -  Follow Up Plan: Telephone follow up appointment with care management team member scheduled for:    Care Plan : Impaired Urinary Elimination  Updates made by Lynne Logan, RN since 09/10/2020 12:00 AM    Problem: Impaired Urinary Elimination (reoccurring UTI's)   Priority: High    Goal: Impaired Urinary Elimination   Start Date: 09/03/2020  Expected End Date: 12/04/2020  This Visit's Progress: On track  Priority: High  Note:   Current Barriers:   Ineffective Self Health Maintenance  Clinical Goal(s):  [name] Collaboration with [name], MD regarding development and update of comprehensive plan of care as evidenced by provider attestation and co-signature . Inter-disciplinary care team collaboration (see longitudinal plan of care)  patient will work with care management team to address care coordination and chronic disease management needs related to Disease Management  Educational Needs  Care Coordination  Medication Management and Education  Psychosocial Support   Interventions:  09/03/20 completed call with patient   Evaluation of current treatment plan related to  Impaired Urinary Elimination , self-management and patient's adherence to plan as established by  provider.  Collaboration with [name], MD regarding development and update of comprehensive plan of care as evidenced by provider attestation       and co-signature  Inter-disciplinary care team collaboration (see longitudinal plan of care) . Provided education to patient about basic disease process related to reoccurring UTI's . Review of patient status, including review of consultants reports, relevant laboratory and other test results, and medications completed. . Reviewed medications with patient and discussed importance of medication adherence . Educated patient on perineal hygiene; Educated patient on the importance of increasing water intake to 64 oz daily unless otherwise daily  . Instructed patient to report early symptoms of UTI promptly for early diagnosis/treatment   Discussed plans with patient for ongoing care management follow up and provided patient with direct contact information for care management team Self Care Activities:  . Continue to keep all scheduled follow up appointments . Take medications as directed  . Let your healthcare team know if you are unable to take your medications . Call your pharmacy for refills at least 7 days prior to running out of medication .  Increase water intake to 64 oz daily unless otherwise directed . Use perineal hygiene as discussed  . Complete all oral antibiotics as prescribed for treatment of UTI . Report reoccurring symptoms of UTI promptly for early diagnosis/treatment Patient Goals: - resolve reoccurring UTI's   Follow Up Plan: Telephone follow up appointment with care management team member scheduled for: 10/15/20    Plan:Telephone follow up appointment with care management team member scheduled for:  10/15/20  Barb Merino, RN, BSN, CCM Care Management Coordinator [facility]  Direct Phone: [phone]

## 2020-09-10 NOTE — Chronic Care Management (AMB) (Signed)
Chronic Care Management   CCM RN Visit Note  09/10/2020 Name: Yvonne Miller MRN: [id number] DOB: [birth year]  Subjective: Yvonne Miller is a 70 y.o. year old female who is a primary care patient of [name], MD. The care management team was consulted for assistance with disease management and care coordination needs.    Engaged with patient by telephone for follow up visit in response to provider referral for case management and/or care coordination services.   Consent to Services:  The patient was given information about Chronic Care Management services, agreed to services, and gave verbal consent prior to initiation of services.  Please see initial visit note for detailed documentation.   Patient agreed to services and verbal consent obtained.   Assessment: Review of patient past medical history, allergies, medications, health status, including review of consultants reports, laboratory and other test data, was performed as part of comprehensive evaluation and provision of chronic care management services.   SDOH (Social Determinants of Health) assessments and interventions performed:  No  CCM Care Plan  Allergies  Allergen Reactions  . Other Other (See Comments)    NO BLOOD   . Jehovah witness    Outpatient Encounter Medications as of 09/07/2020  Medication Sig  . ACCU-CHEK AVIVA PLUS test strip CHECK 4 TIMES BY INTRADERMAL ROUTE EVERY DAY  . albuterol (PROVENTIL HFA;VENTOLIN HFA) 108 (90 Base) MCG/ACT inhaler Inhale 2 puffs into the lungs every 4 (four) hours as needed for wheezing or shortness of breath.   [name] amLODipine (NORVASC) 5 MG tablet TAKE 1 TABLET BY MOUTH  DAILY  . aspirin EC 81 MG tablet Take 81 mg by mouth at bedtime.   . B Complex Vitamins (B COMPLEX-B12) TABS Take 1 tablet by mouth daily.  . carvedilol (COREG) 25 MG tablet TAKE 1 AND 1/2 TABLETS BY  MOUTH TWICE DAILY (Patient taking differently: Take by mouth 2 (two) times daily with a meal. Taking 1.5 BID)  .  cephALEXin (KEFLEX) 500 MG capsule One tab po tid x 5 days  . Cholecalciferol (VITAMIN D) 50 MCG (2000 UT) tablet Take 2,000 Units by mouth daily.  . Chromium 1000 MCG TABS Take 1,000 mcg by mouth daily.   . Cinnamon 500 MG capsule Take 500 mg by mouth daily.  . fluticasone (FLONASE) 50 MCG/ACT nasal spray Place 1 spray into both nostrils daily. (Patient taking differently: Place 1 spray into both nostrils as needed for allergies.)  . furosemide (LASIX) 40 MG tablet Take 1 tablet (40 mg total) by mouth every morning. One tab twice daily for 1 week  . hydrALAZINE (APRESOLINE) 50 MG tablet TAKE 1 TABLET BY MOUTH  TWICE DAILY  . Insulin Lispro Prot & Lispro (HUMALOG MIX 75/25 KWIKPEN) (75-25) 100 UNIT/ML Kwikpen Inject 45 Units into the skin at bedtime.  [name] nystatin (NYSTATIN) powder Apply topically 3 (three) times daily as needed. As needed  . Omega-3 Fatty Acids (FISH OIL) 500 MG CAPS Take 500 mg by mouth daily.  [name] omeprazole (PRILOSEC) 40 MG capsule Take 40 mg by mouth daily.  . pravastatin (PRAVACHOL) 40 MG tablet TAKE 1 TABLET BY MOUTH  DAILY  . Probiotic Product (PROBIOTIC PO) Take by mouth.  [name] ROCKLATAN 0.02-0.005 % SOLN SMARTSIG:1 Drop(s) In Eye(s) Every Evening  . TRULICITY 1.5 MG/0.5ML SOPN INJECT 1.5 MG INTO THE SKIN ONCE A WEEK. (Patient taking differently: Inject 1.5 mg as directed every Wednesday.)  . valsartan-hydrochlorothiazide (DIOVAN-HCT) 160-25 MG tablet TAKE 1 TABLET BY MOUTH  DAILY  No facility-administered encounter medications on file as of 09/07/2020.    Patient Active Problem List   Diagnosis Date Noted  . Non-ischemic cardiomyopathy ([place]) 02/18/2019  . Encounter for assessment of implantable cardioverter-defibrillator (ICD)   . Type 2 diabetes mellitus with stage 3 chronic kidney disease, with long-term current use of insulin ([place]) 03/04/2018  . 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  . Chronic combined systolic and diastolic congestive heart failure ([place])  03/04/2018  . Chronic renal disease, stage II 03/04/2018  . Status post revision of total replacement of left knee 01/26/2018  . Unilateral primary osteoarthritis, left knee 08/18/2017  . Biventricular automatic implantable cardioverter defibrillator in situ 12/13/2013  . ICD -  Biventricular  [employer] Dynagen X4 CRT-D Model G158 ICD in place 10/28/13 10/28/2013  . HTN (hypertension) 10/03/2013  . Hyperlipemia 10/03/2013  . LBBB (left bundle branch block) 10/03/2013  . Cardiomyopathy ([place]) 10/03/2013  . DM (diabetes mellitus) ([place]) 10/03/2013    Conditions to be addressed/monitored:DM type 2, HTN, HLD, CKD, CHF  Care Plan : Chronic Kidney (Adult)  Updates made by Lynne Logan, RN since 09/10/2020 12:00 AM    Problem: Disease Progression   Priority: Medium    Long-Range Goal: Disease Progression Prevented or Minimized   Start Date: 09/03/2020  Expected End Date: 09/03/2021  This Visit's Progress: On track  Priority: Medium  Note:   Current Barriers:   Ineffective Self Health Maintenance  Clinical Goal(s):  [name] Collaboration with [name], MD regarding development and update of comprehensive plan of care as evidenced by provider attestation and co-signature . Inter-disciplinary care team collaboration (see longitudinal plan of care)  patient will work with care management team to address care coordination and chronic disease management needs related to Disease Management  Educational Needs  Care Coordination  Medication Management and Education  Psychosocial Support   Interventions:  09/03/20 completed call with patient   Evaluation of current treatment plan related to CKD Stage III , self-management and patient's adherence to plan as established by provider.  Collaboration with [name], MD regarding development and update of comprehensive plan of care as evidenced by provider attestation       and co-signature  Inter-disciplinary care team collaboration  (see longitudinal plan of care) . Provided education to patient about basic disease process related to CKD  . Review of patient status, including review of consultants reports, relevant laboratory and other test results, and medications completed. . Reviewed medications with patient and discussed importance of medication adherence . Educated patient on dietary recommendations; Educated on the importance of drinking at least 64 oz of water daily unless otherwise directed   Discussed plans with patient for ongoing care management follow up and provided patient with direct contact information for care management team Self Care Activities:  . Continue to adhere to MD recommendations for CKD  . Continue to keep all scheduled follow up appointments . Take medications as directed  . Let your healthcare team know if you are unable to take your medications . Call your pharmacy for refills at least 7 days prior to running out of medication . Increase your water intake unless otherwise directed . Review mailed printed educational materials related to Kidney disease Patient Goals: - maintain adequate kidney function   Follow Up Plan: Telephone follow up appointment with care management team member scheduled for: 10/15/20   Care Plan : Heart Failure (Adult)  Updates made by Lynne Logan, RN since 09/10/2020 12:00 AM  Problem: Symptom Exacerbation (Heart Failure)   Priority: High    Long-Range Goal: Symptom Exacerbation Prevented or Minimized   Start Date: 09/03/2020  Expected End Date: 12/04/2020  This Visit's Progress: On track  Priority: High  Note:   Current Barriers:  [name] Knowledge deficits related to basic heart failure pathophysiology and self care management . Lack of scale in home . Financial strain Nurse Case Manager Clinical Goal(s):   patient will weigh self daily and record  patient will verbalize understanding of Heart Failure Action Plan and when to call doctor  patient will  take all Heart Failure mediations as prescribed Interventions:  09/03/20 call completed with patient  . Collaboration with [name], MD regarding development and update of comprehensive plan of care as evidenced by provider attestation and co-signature . Inter-disciplinary care team collaboration (see longitudinal plan of care) . Reviewed Cardiology f/u completed with Dr. Rockwell [place] on 08/31/20 with the following Assessment/Plan noted: . "Patient's ICD was evaluated, normal function.  Patient is in acute decompensated heart failure, I reviewed her BMP, renal function is slightly worsening over the past 2 months.  We will discontinue spironolactone, I will add furosemide 40 mg twice daily for 1 week followed by 40 mg once a day.  We will repeat BMP and also BNP in 2 weeks prior to her seeing me back in 3 weeks.  Suspect her acute decompensation is probably related to recent gastroparesis from diabetes and frequent stools and frequent UTI." . Basic overview and discussion of pathophysiology of Heart Failure . Provided written and verbal education on low sodium diet . Reviewed Heart Failure Action Plan in depth and provided written copy . Assessed for scales in home . Discussed importance of daily weight . Reviewed role of diuretics in prevention of fluid overload . Reviewed scheduled/upcoming provider appointments including: next Cardiology follow up appointment scheduled for 09/21/20 $[id number]'@[id number]$ :80 AM  . Discussed plans with patient for ongoing care management follow up and provided patient with direct contact information for care management team Self-Care Activities:  Takes Heart Failure Medications as prescribed Weighs daily and record (notifying MD of 3 lb weight gain over night or 5 lb in a week) Verbalizes understanding of and follows CHF Action Plan Adheres to low sodium diet  Patient Goals:  - Take Heart Failure Medications as prescribed - Weigh daily and record (notify MD with 3 lb weight gain over  night or 5 lb in a week) - Follow CHF Action Plan - Adhere to low sodium diet - develop a rescue plan - follow rescue plan if symptoms flare-up - know when to call the doctor - track symptoms and what helps feel better or worse  Follow Up Plan: Telephone follow up appointment with care management team member scheduled for: 10/15/20   Care Plan : Hypertension (Adult)  Updates made by Lynne Logan, RN since 09/10/2020 12:00 AM    Problem: Hypertension (Hypertension)   Priority: Medium    Long-Range Goal: Hypertension Monitored   Start Date: 09/03/2020  Expected End Date: 09/03/2021  This Visit's Progress: On track  Priority: Medium  Note:   Objective:  . Last practice recorded BP readings:  BP Readings from Last 3 Encounters:  08/31/20 140/82  06/21/20 118/64  06/21/20 118/64 .   [name] Most recent eGFR/CrCl:  Lab Results  Component Value Date   EGFR 35 (L) 08/31/2020 .    No components found for: CRCL Current Barriers:  [name] Knowledge Deficits related to basic understanding of hypertension pathophysiology  and self care management . Knowledge Deficits related to understanding of medications prescribed for management of hypertension Case Manager Clinical Goal(s):  . patient will demonstrate improved adherence to prescribed treatment plan for hypertension as evidenced by taking all medications as prescribed, monitoring and recording blood pressure as directed, adhering to low sodium/DASH diet Interventions:  52/22 call completed with patient  . Collaboration with [name], MD regarding development and update of comprehensive plan of care as evidenced by provider attestation and co-signature . Inter-disciplinary care team collaboration (see longitudinal plan of care) . Advised patient, providing education and rationale, to monitor blood pressure daily and record, calling PCP for findings outside established parameters.  . Evaluation of current treatment plan related to hypertension  self management and patient's adherence to plan as established by provider. . Provided education to patient re: stroke prevention, s/s of heart attack and stroke, DASH diet, complications of uncontrolled blood pressure . Reviewed medications with patient and discussed importance of compliance . Discussed plans with patient for ongoing care management follow up and provided patient with direct contact information for care management team Self-Care Activities: - Self administers medications as prescribed Attends all scheduled provider appointments Calls provider office for new concerns, questions, or BP outside discussed parameters Checks BP and records as discussed Follows a low sodium diet/DASH diet Patient Goals: -  Follow Up Plan: Telephone follow up appointment with care management team member scheduled for:    Care Plan : Impaired Urinary Elimination  Updates made by Lynne Logan, RN since 09/10/2020 12:00 AM    Problem: Impaired Urinary Elimination (reoccurring UTI's)   Priority: High    Goal: Impaired Urinary Elimination   Start Date: 09/03/2020  Expected End Date: 12/04/2020  Recent Progress: On track  Priority: High  Note:   Current Barriers:   Ineffective Self Health Maintenance  Clinical Goal(s):  [name] Collaboration with [name], MD regarding development and update of comprehensive plan of care as evidenced by provider attestation and co-signature . Inter-disciplinary care team collaboration (see longitudinal plan of care)  patient will work with care management team to address care coordination and chronic disease management needs related to Disease Management  Educational Needs  Care Coordination  Medication Management and Education  Psychosocial Support   Interventions:  09/07/20 completed call with patient   Evaluation of current treatment plan related to  Impaired Urinary Elimination , self-management and patient's adherence to plan as established by  provider.  Collaboration with [name], MD regarding development and update of comprehensive plan of care as evidenced by provider attestation       and co-signature  Inter-disciplinary care team collaboration (see longitudinal plan of care) . Provided education to patient about basic disease process related to reoccurring UTI's . Review of patient status, including review of consultants reports, relevant laboratory and other test results, and medications completed. . Reviewed medications with patient and discussed importance of medication adherence . Educated patient on perineal hygiene; Educated patient on the importance of increasing water intake to 64 oz daily unless otherwise daily  . Instructed patient to report early symptoms of UTI promptly for early diagnosis/treatment  . Collaborated with Dr. Baird Cancer regarding patient's reports of symptoms suggestive of UTI, advised patient had completed her antibiotic prior to having urine re-culture . Instructed patient on prescribed treatment, reviewed indication, dosage and frequency . Completed a joint call with patient and [facility] to confirm the Rx was received today and ready for pickup; discussed patient will fill and start  today    Discussed plans with patient for ongoing care management follow up and provided patient with direct contact information for care management team Self Care Activities:  . Continue to keep all scheduled follow up appointments . Take medications as directed  . Let your healthcare team know if you are unable to take your medications . Call your pharmacy for refills at least 7 days prior to running out of medication . Increase water intake to 64 oz daily unless otherwise directed . Use perineal hygiene as discussed  . Complete all oral antibiotics as prescribed for treatment of UTI . Report reoccurring symptoms of UTI promptly for early diagnosis/treatment Patient Goals: - resolve reoccurring UTI's   Follow Up  Plan: Telephone follow up appointment with care management team member scheduled for: 10/15/20    Plan:Telephone follow up appointment with care management team member scheduled for:  10/15/20  Barb Merino, RN, BSN, CCM Care Management Coordinator [facility]  Direct Phone: [phone]

## 2020-09-11 ENCOUNTER — Telehealth: Payer: Self-pay

## 2020-09-11 NOTE — Chronic Care Management (AMB) (Signed)
Left patient an appointment reminder with [name] ,[place] on 09-11-2020 at 8:30. Instructed patient to have all meds/supplements, any logs available for review and if unable to keep appointment to call to reschedule.  Tool  [phone]

## 2020-09-12 ENCOUNTER — Ambulatory Visit: Payer: Self-pay

## 2020-09-12 DIAGNOSIS — I5032 Chronic diastolic (congestive) heart failure: Secondary | ICD-10-CM | POA: Diagnosis not present

## 2020-09-12 DIAGNOSIS — N183 Chronic kidney disease, stage 3 unspecified: Secondary | ICD-10-CM | POA: Diagnosis not present

## 2020-09-12 DIAGNOSIS — I10 Essential (primary) hypertension: Secondary | ICD-10-CM

## 2020-09-12 DIAGNOSIS — Z794 Long term (current) use of insulin: Secondary | ICD-10-CM

## 2020-09-12 DIAGNOSIS — E785 Hyperlipidemia, unspecified: Secondary | ICD-10-CM | POA: Diagnosis not present

## 2020-09-12 DIAGNOSIS — E7800 Pure hypercholesterolemia, unspecified: Secondary | ICD-10-CM

## 2020-09-12 DIAGNOSIS — N1832 Chronic kidney disease, stage 3b: Secondary | ICD-10-CM

## 2020-09-12 DIAGNOSIS — E1122 Type 2 diabetes mellitus with diabetic chronic kidney disease: Secondary | ICD-10-CM

## 2020-09-12 NOTE — Progress Notes (Signed)
Chronic Care Management Pharmacy Note  09/19/2020 Name:  Yvonne Miller MRN:  [id number] DOB:  August 25, 1950  Subjective: Yvonne Miller is an 70 y.o. year old female who is a primary patient of [name], MD.  The CCM team was consulted for assistance with disease management and care coordination needs.  Patient reports that she is concerned about her health. She went to see Dr. Einar Gip two weeks ago Friday and he is concerned that she might have heart failure. She reports that she is also concerned with her kidneys.    Engaged with patient by telephone for follow up visit in response to provider referral for pharmacy case management and/or care coordination services.   Consent to Services:  The patient was given information about Chronic Care Management services, agreed to services, and gave verbal consent prior to initiation of services.  Please see initial visit note for detailed documentation.   Patient Care Team: [name], MD as PCP - General (Internal Medicine) Rex Kras Claudette Stapler, RN as Case Manager [name], [facility] (Pharmacist)  Recent office visits: 06/21/2020 PCP OV  Recent consult visits: Cardiology OV - Furosemide 40 mg twice a day and then start taking 40 mg once a day. Spironolactone discontinued at that time.   Hospital visits: None in previous 6 months  Objective:  Lab Results  Component Value Date   CREATININE 1.97 (H) 09/14/2020   BUN 42 (H) 09/14/2020   GFRNONAA 39 (L) 06/21/2020   GFRAA 45 (L) 06/21/2020   NA 137 09/14/2020   K 4.3 09/14/2020   CALCIUM 9.1 09/14/2020   CO2 23 09/14/2020   GLUCOSE 179 (H) 09/14/2020    Lab Results  Component Value Date/Time   HGBA1C 7.7 (H) 06/21/2020 11:37 AM   HGBA1C 7.3 (H) 02/07/2020 10:50 AM   MICROALBUR 30 10/05/2019 12:07 PM   MICROALBUR 10 09/30/2018 03:26 PM    Last diabetic Eye exam:  Lab Results  Component Value Date/Time   HMDIABEYEEXA No Retinopathy 12/02/2019 12:00 AM    Last diabetic  Foot exam: No results found for: HMDIABFOOTEX   Lab Results  Component Value Date   CHOL 124 06/21/2020   HDL 42 06/21/2020   LDLCALC 61 06/21/2020   TRIG 117 06/21/2020   CHOLHDL 3.0 06/21/2020    Hepatic Function Latest Ref Rng & Units 06/21/2020 10/05/2019 06/16/2019  Total Protein 6.0 - 8.5 g/dL 7.3 7.4 7.0  Albumin 3.8 - 4.8 g/dL 4.0 4.3 4.1  AST 0 - 40 IU/L $Remov'14 15 14  'rZUEIw$ ALT 0 - 32 IU/L $Remov'15 13 13  'wHkUGV$ Alk Phosphatase 44 - 121 IU/L 107 98 107  Total Bilirubin 0.0 - 1.2 mg/dL 0.2 <0.2 <0.2     CBC Latest Ref Rng & Units 06/21/2020 10/05/2019 09/30/2018  WBC 3.4 - 10.8 x10E3/uL 10.5 10.6 9.3  Hemoglobin 11.1 - 15.9 g/dL 11.6 11.5 12.0  Hematocrit 34.0 - 46.6 % 36.8 36.1 36.0  Platelets 150 - 450 x10E3/uL 333 328 367    No results found for: VD25OH  Clinical ASCVD: No  The ASCVD Risk score Mikey Bussing DC Jr., et al., 2013) failed to calculate for the following reasons:   The valid total cholesterol range is 130 to 320 mg/dL    Depression screen [facility] 2/9 06/21/2020 10/05/2019 06/16/2019  Decreased Interest 0 0 0  Down, Depressed, Hopeless 0 0 0  PHQ - 2 Score 0 0 0  Altered sleeping - - 0  Tired, decreased energy - - 0  Change in  appetite - - 0  Feeling bad or failure about yourself  - - 0  Trouble concentrating - - 0  Moving slowly or fidgety/restless - - 0  Suicidal thoughts - - 0  PHQ-9 Score - - 0  Difficult doing work/chores - - Not difficult at all     Social History   Tobacco Use  Smoking Status Never Smoker  Smokeless Tobacco Never Used   BP Readings from Last 3 Encounters:  08/31/20 140/82  06/21/20 118/64  06/21/20 118/64   Pulse Readings from Last 3 Encounters:  08/31/20 65  06/21/20 77  06/21/20 77   Wt Readings from Last 3 Encounters:  08/31/20 281 lb (127.5 kg)  06/21/20 276 lb (125.2 kg)  06/21/20 276 lb 9.6 oz (125.5 kg)   BMI Readings from Last 3 Encounters:  08/31/20 49.78 kg/m  06/21/20 48.89 kg/m  06/21/20 49.00 kg/m    Assessment/Interventions:  Review of patient past medical history, allergies, medications, health status, including review of consultants reports, laboratory and other test data, was performed as part of comprehensive evaluation and provision of chronic care management services.   SDOH:  (Social Determinants of Health) assessments and interventions performed: No  SDOH Screenings   Alcohol Screen: Not on file  Depression (PHQ2-9): Low Risk   . PHQ-2 Score: 0  Financial Resource Strain: Low Risk   . Difficulty of Paying Living Expenses: Not hard at all  Food Insecurity: No Food Insecurity  . Worried About Charity fundraiser in the Last Year: Never true  . Ran Out of Food in the Last Year: Never true  Housing: Not on file  Physical Activity: Insufficiently Active  . Days of Exercise per Week: 3 days  . Minutes of Exercise per Session: 30 min  Social Connections: Not on file  Stress: No Stress Concern Present  . Feeling of Stress : Not at all  Tobacco Use: Low Risk   . Smoking Tobacco Use: Never Smoker  . Smokeless Tobacco Use: Never Used  Transportation Needs: No Transportation Needs  . Lack of Transportation (Medical): No  . Lack of Transportation (Non-Medical): No    CCM Care Plan  Allergies  Allergen Reactions  . Other Other (See Comments)    NO BLOOD   . Jehovah witness    Medications Reviewed Today    Reviewed by [name], [facility] (Pharmacist) on 09/12/20 at [phone]  Med List Status: <None>  Medication Order Taking? Sig Documenting Provider Last Dose Status Informant  ACCU-CHEK AVIVA PLUS test strip [id number] Yes CHECK 4 TIMES BY INTRADERMAL ROUTE EVERY DAY [name], MD Taking Active   albuterol (PROVENTIL HFA;VENTOLIN HFA) 108 (90 Base) MCG/ACT inhaler [id number] Yes Inhale 2 puffs into the lungs every 4 (four) hours as needed for wheezing or shortness of breath.  [provider] Taking Active Self  amLODipine (NORVASC) 5 MG tablet [id number] Yes TAKE 1 TABLET BY MOUTH  DAILY Adrian Prows, MD Taking Active   aspirin EC 81 MG tablet [id number] Yes Take 81 mg by mouth at bedtime.  [provider] Taking Active Self           Med Note Michelle Nasuti   Thu Jun 16, 2019  8:38 AM)    B Complex Vitamins (B COMPLEX-B12) TABS [id number] Yes Take 1 tablet by mouth daily. [provider] Taking Active Self           Med Note Jannifer Rodney, APRIL   Thu Oct 21, 2018  8:17 AM)  carvedilol (COREG) 25 MG tablet [id number]  TAKE 1 AND 1/2 TABLETS BY  MOUTH TWICE DAILY  Patient taking differently: No sig reported   Adrian Prows, MD  Active   cephALEXin (KEFLEX) 500 MG capsule [id number] Yes One tab po tid x 5 days [name], MD Taking Active   Cholecalciferol (VITAMIN D) 50 MCG (2000 UT) tablet [id number] Yes Take 2,000 Units by mouth daily. [provider] Taking Active   Chromium 1000 MCG TABS [id number] Yes Take 1,000 mcg by mouth daily.  [provider] Taking Active   Cinnamon 500 MG capsule [id number] Yes Take 500 mg by mouth daily. [provider] Taking Active Self           Med Note Jannifer Rodney, APRIL   Thu Oct 21, 2018  8:17 AM)    fluticasone (FLONASE) 50 MCG/ACT nasal spray [id number]  Place 1 spray into both nostrils daily.  Patient taking differently: Place 1 spray into both nostrils as needed for allergies.   [name], MD  Expired 02/07/20 2359   furosemide (LASIX) 40 MG tablet [id number] Yes Take 1 tablet (40 mg total) by mouth every morning. One tab twice daily for 1 week Adrian Prows, MD Taking Active   hydrALAZINE (APRESOLINE) 50 MG tablet [id number] Yes TAKE 1 TABLET BY MOUTH  TWICE DAILY Adrian Prows, MD Taking Active   Insulin Lispro Prot & Lispro (HUMALOG MIX 75/25 KWIKPEN) (75-25) 100 UNIT/ML Claiborne Rigg [id number] Yes Inject 45 Units into the skin at bedtime. [name], MD Taking Active   nystatin (NYSTATIN) powder [id number] Yes Apply topically 3 (three) times daily as needed. As needed [name], MD Taking Active    Omega-3 Fatty Acids (FISH OIL) 500 MG CAPS [id number] Yes Take 500 mg by mouth daily. [provider] Taking Active   omeprazole (PRILOSEC) 40 MG capsule [id number] Yes Take 40 mg by mouth daily. [provider] Taking Active   pravastatin (PRAVACHOL) 40 MG tablet [id number] Yes TAKE 1 TABLET BY MOUTH  DAILY [name], MD Taking Active   Probiotic Product (PROBIOTIC PO) [id number] Yes Take by mouth. [provider] Taking Active   ROCKLATAN 0.02-0.005 % SOLN [id number] Yes SMARTSIG:1 Drop(s) In Eye(s) Every Evening [provider] Taking Active   TRULICITY 1.5 QH/4.7ML SOPN [id number] Yes INJECT 1.5 MG INTO THE SKIN ONCE A WEEK.  Patient taking differently: Inject 1.5 mg as directed every Wednesday.   [name], MD Taking Active   valsartan-hydrochlorothiazide (DIOVAN-HCT) 160-25 MG tablet [id number] Yes TAKE 1 TABLET BY MOUTH  DAILY [name], MD Taking Active           Patient Active Problem List   Diagnosis Date Noted  . Non-ischemic cardiomyopathy ([place]) 02/18/2019  . Encounter for assessment of implantable cardioverter-defibrillator (ICD)   . Type 2 diabetes mellitus with stage 3 chronic kidney disease, with long-term current use of insulin ([place]) 03/04/2018  . 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  . Chronic combined systolic and diastolic congestive heart failure ([place]) 03/04/2018  . Chronic renal disease, stage II 03/04/2018  . Status post revision of total replacement of left knee 01/26/2018  . Unilateral primary osteoarthritis, left knee 08/18/2017  . Biventricular automatic implantable cardioverter defibrillator in situ 12/13/2013  . ICD -  Biventricular  [employer] Dynagen X4 CRT-D Model G158 ICD in place 10/28/13 10/28/2013  . HTN (hypertension) 10/03/2013  . Hyperlipemia 10/03/2013  . LBBB (left bundle branch block) 10/03/2013  . Cardiomyopathy ([place]) 10/03/2013  . DM (diabetes mellitus) ([place])  10/03/2013     Immunization History  Administered Date(s) Administered  . Fluad Quad(high Dose 65+) 02/07/2020  . Hepatitis A 01/19/2012, 02/16/2012  . Hepatitis B 01/19/2012, 02/16/2012  . IPV 02/16/2012  . Influenza, High Dose Seasonal PF 02/17/2016, 03/04/2018, 02/10/2019  . Influenza-Unspecified 02/02/2014, 02/16/2014  . MMR 04/04/1994  . PFIZER(Purple Top)SARS-COV-2 Vaccination 06/11/2019, 07/02/2019, 02/21/2020  . Pneumococcal Conjugate-13 05/16/2020  . Td 09/08/2006  . Tdap 01/19/2012, 06/21/2020  . Zoster 12/15/2007    Conditions to be addressed/monitored:  Hypertension and Hyperlipidemia  Care Plan : [place]  Updates made by [name], RPH since 09/19/2020 12:00 AM    Problem: HTN, DM II, HLD   Priority: High    Long-Range Goal: Disease Management   Start Date: 07/12/2020  Recent Progress: On track  Priority: High  Note:     Current Barriers:  . Unable to independently monitor therapeutic efficacy   Pharmacist Clinical Goal(s):  [name] Over the next 90 days, patient will verbalize ability to afford treatment regimen through collaboration with PharmD and provider.   Interventions: . 1:1 collaboration with [name], MD regarding development and update of comprehensive plan of care as evidenced by provider attestation and co-signature . Inter-disciplinary care team collaboration (see longitudinal plan of care) . Comprehensive medication review performed; medication list updated in electronic medical record   Hypertension (BP goal <130/80) -Controlled -Current treatment: . Amlodipine 5 mg tablet daily . Hydralazine 50 mg tablet twice daily  . Valsartan-HCTZ 160-25mg  take 1 tablet by mouth daily  . Carvedilol 25 mg -take 1.5 tablets twice daily.   -Current home readings: she is checking her BP at home 120-140/81 over two weeks. Yesterday her BP was 121/78  -Current dietary habits: not eating as much, she does not use salt to season her  food -Denies hypotensive/hypertensive symptoms -Educated on BP goals and benefits of medications for prevention of heart attack, stroke and kidney damage; Daily salt intake goal < 2300 mg; Importance of home blood pressure monitoring; -Counseled to monitor BP at home at least one time per day, document, and provide log at future appointments -Recommended to continue current medication  Hyperlipidemia: (LDL goal < 70) -Controlled -Current treatment: . Aspirin 81 mg EC- take one tablet daily. . Pravastatin 40 mg take daily.  o Patient is on automatic refill and she has over a month left.  -Current dietary patterns: patient reports that she is going to start keeping a food log -Current exercise habits: please see diabetes for detailed information on exercise.  -Congratulated patient on LDL <70  Importance of limiting foods high in cholesterol; Exercise goal of 150 minutes per week; -Counseled on diet and exercise extensively Recommended to continue current medication  Diabetes (A1c goal <7%) -Uncontrolled -Current medications: . Humalog Mix 75/25 inject 45 units in to the skin at bedtime . Trulicity 1.5 mg /6.7 ML - Inject 1.5 mg into the skin once a week.  -Current home glucose readings: checking BS 3-4 times a day during the past week . fasting glucose:  125, 175,179 . post prandial glucose: <180 -Denies hypoglycemic/hyperglycemic symptoms -Current meal patterns:  . breakfast: 2 boiled eggs  . lunch: burger with lettuce, tomato and onions . dinner: less than a 1/3 of a steak and cup of macaroni and cheese . drinks: drinking some water about 3 bottles of water  -Current exercise: has increased her steps to 2000 steps or more, she is going to increase her steps.  She walks around the  house, the store or walking back and forth from the [place]. She is using an app to make sure that she is exercising and she also places her goals in the app.  -Patient reports that she has been focused  using her smart watch to work out and rest.  -Patient reports that [facility] will call her and remind her to fill prescription.  -Educated on A1c and blood sugar goals; Prevention and management of hypoglycemic episodes; Benefits of routine self-monitoring of blood sugar; Carbohydrate counting and/or plate method  -Counseled to check feet daily and get yearly eye exams -Recommended to continue current medication   Patient Goals/Self-Care Activities . Patient will:  - take medications as prescribed target a minimum of 150 minutes of moderate intensity exercise weekly  Follow Up Plan:   Face to Face appointment with care management team member scheduled for:  10/10/2020 The patient has been provided with contact information for the care management team and has been advised to call with any health related questions or concerns.       Medication Assistance: Humalog and Trulicity obtained through [employer]  medication assistance program.  Enrollment ends 04/2021.  Patient's preferred pharmacy is:  [facility], [address], [address] [place], [place] [address] Phone: [phone] Fax: [phone]  [id number] [place], [address] [place] [address] Phone: [phone] Fax: [phone]  Uses pill box? Yes Pt endorses 98% compliance  We discussed: Benefits of medication synchronization, packaging and delivery as well as enhanced pharmacist oversight with [employer]. Patient decided to: Continue current medication management strategy  Care Plan and Follow Up Patient Decision:  Patient agrees to Care Plan and Follow-up.  Plan: Face to Face appointment with care management team member scheduled for: 10/10/2020 and The patient has been provided with contact information for the care management team and has been advised to call with any health related  questions or concerns.   [name], PharmD Clinical Pharmacist [facility] [phone]

## 2020-09-14 ENCOUNTER — Telehealth: Payer: Medicare Other

## 2020-09-14 DIAGNOSIS — I5042 Chronic combined systolic (congestive) and diastolic (congestive) heart failure: Secondary | ICD-10-CM | POA: Diagnosis not present

## 2020-09-15 LAB — BASIC METABOLIC PANEL
BUN/Creatinine Ratio: 21 (ref 12–28)
BUN: 42 mg/dL — ABNORMAL HIGH (ref 8–27)
CO2: 23 mmol/L (ref 20–29)
Calcium: 9.1 mg/dL (ref 8.7–10.3)
Chloride: 98 mmol/L (ref 96–106)
Creatinine, Ser: 1.97 mg/dL — ABNORMAL HIGH (ref 0.57–1.00)
Glucose: 179 mg/dL — ABNORMAL HIGH (ref 65–99)
Potassium: 4.3 mmol/L (ref 3.5–5.2)
Sodium: 137 mmol/L (ref 134–144)
eGFR: 27 mL/min/{1.73_m2} — ABNORMAL LOW (ref >59)

## 2020-09-15 LAB — BRAIN NATRIURETIC PEPTIDE: BNP: 28.6 pg/mL (ref 0.0–100.0)

## 2020-09-15 LAB — MAGNESIUM: Magnesium: 2.3 mg/dL (ref 1.6–2.3)

## 2020-09-15 NOTE — Progress Notes (Signed)
Cardiorenal syndrome with worsening serum creatinine due to diuresis.  Serum glucose is also increased.  Will discuss on office visit.  Normal magnesium.  Patient has an appointment soon.

## 2020-09-19 NOTE — Patient Instructions (Addendum)
Visit Information It was great speaking with you today!  Please let me know if you have any questions about our visit.  Goals Addressed            This Visit's Progress   . Monitor and Manage My Blood Sugar-Diabetes Type 2       Timeframe:  Long-Range Goal Priority:  High Start Date:                             Expected End Date:                       Follow Up Date 10/10/2020   - check blood sugar at prescribed times - take the blood sugar log to all doctor visits - take the blood sugar meter to all doctor visits    Why is this important?    Checking your blood sugar at home helps to keep it from getting very high or very low.   Writing the results in a diary or log helps the doctor know how to care for you.   Your blood sugar log should have the time, date and the results.   Also, write down the amount of insulin or other medicine that you take.   Other information, like what you ate, exercise done and how you were feeling, will also be helpful.     Notes:       Patient Care Plan: CCM Pharmacy Care Plan    Problem Identified: HTN, DM II, HLD   Priority: High    Long-Range Goal: Disease Management   Start Date: 07/12/2020  Recent Progress: On track  Priority: High  Note:     Current Barriers:  . Unable to independently monitor therapeutic efficacy   Pharmacist Clinical Goal(s):  [name] Over the next 90 days, patient will verbalize ability to afford treatment regimen through collaboration with PharmD and provider.   Interventions: . 1:1 collaboration with [name], MD regarding development and update of comprehensive plan of care as evidenced by provider attestation and co-signature . Inter-disciplinary care team collaboration (see longitudinal plan of care) . Comprehensive medication review performed; medication list updated in electronic medical record   Hypertension (BP goal <130/80) -Controlled -Current treatment: . Amlodipine 5 mg tablet  daily . Hydralazine 50 mg tablet twice daily  . Valsartan-HCTZ 160-25mg  take 1 tablet by mouth daily  . Carvedilol 25 mg tablet twice per day   -Current home readings: she is checking her BP at home 120-140/81 over two weeks. Yesterday her BP was 121/78  -Current dietary habits: not eating as much, she does not use salt to season her food -Denies hypotensive/hypertensive symptoms -Educated on BP goals and benefits of medications for prevention of heart attack, stroke and kidney damage; Daily salt intake goal < 2300 mg; Importance of home blood pressure monitoring; -Counseled to monitor BP at home at least one time per day, document, and provide log at future appointments -Recommended to continue current medication  Hyperlipidemia: (LDL goal < 70) -Controlled -Current treatment: . Aspirin 81 mg tablet daily . Pravastatin 40 mg take daily o Patient is on automatic refill and she has over a month left.  -Current dietary patterns: patient reports that she is going to start keeping a food log -Current exercise habits: please see diabetes for detailed information on exercise.  -Congratulated patient on LDL <70  Importance of limiting foods high in cholesterol; Exercise goal  of 150 minutes per week; -Counseled on diet and exercise extensively Recommended to continue current medication  Diabetes (A1c goal <7%) -Uncontrolled -Current medications: . Humalog Mix 75/25 inject 45 units in to the skin at bedtime . Trulicity 1.5 mg /7.8 ML - Inject 1.5 mg into the skin once a week.  -Current home glucose readings: checking BS 3-4 times a day during the past week . fasting glucose:  125, 175,179 . post prandial glucose: <180 -Denies hypoglycemic/hyperglycemic symptoms -Current meal patterns:  . breakfast: 2 boiled eggs  . lunch: burger with lettuce, tomato and onions . dinner: less than a 1/3 of a steak and cup of macaroni and cheese . drinks: drinking some water about 3 bottles of water   -Current exercise: has increased her steps to 2000 steps or more, she is going to increase her steps.  She walks around the house, the store or walking back and forth from the [place]. She is using an app to make sure that she is exercising and she also places her goals in the app.  -Patient reports that she has been focused using her smart watch to work out and rest.  -Patient reports that [facility] will call her and remind her to fill prescription.  -Educated on A1c and blood sugar goals; Prevention and management of hypoglycemic episodes; Benefits of routine self-monitoring of blood sugar; Carbohydrate counting and/or plate method  -Counseled to check feet daily and get yearly eye exams -Recommended to continue current medication   Patient Goals/Self-Care Activities . Patient will:  - take medications as prescribed target a minimum of 150 minutes of moderate intensity exercise weekly  Follow Up Plan:   Face to Face appointment with care management team member scheduled for:  10/10/2020 The patient has been provided with contact information for the care management team and has been advised to call with any health related questions or concerns.        Patient agreed to services and verbal consent obtained.   The patient verbalized understanding of instructions, educational materials, and care plan provided today and agreed to receive a mailed copy of patient instructions, educational materials, and care plan.   [name], PharmD Clinical Pharmacist [facility] [phone]

## 2020-09-21 ENCOUNTER — Ambulatory Visit: Payer: Medicare Other | Admitting: Cardiology

## 2020-09-24 ENCOUNTER — Encounter: Payer: Self-pay | Admitting: Internal Medicine

## 2020-09-25 ENCOUNTER — Other Ambulatory Visit: Payer: Self-pay

## 2020-09-25 ENCOUNTER — Ambulatory Visit (INDEPENDENT_AMBULATORY_CARE_PROVIDER_SITE_OTHER): Payer: Medicare Other | Admitting: Nurse Practitioner

## 2020-09-25 ENCOUNTER — Other Ambulatory Visit (HOSPITAL_COMMUNITY)
Admission: RE | Admit: 2020-09-25 | Discharge: 2020-09-25 | Disposition: A | Discharge status: Home / Self Care | Payer: Medicare Other | Source: Ambulatory Visit | Attending: Nurse Practitioner | Admitting: Nurse Practitioner

## 2020-09-25 VITALS — BP 120/70 | HR 74 | Temp 98.7°F | Ht 62.6 in | Wt 277.8 lb

## 2020-09-25 DIAGNOSIS — I129 Hypertensive chronic kidney disease with stage 1 through stage 4 chronic kidney disease, or unspecified chronic kidney disease: Secondary | ICD-10-CM

## 2020-09-25 DIAGNOSIS — E1122 Type 2 diabetes mellitus with diabetic chronic kidney disease: Secondary | ICD-10-CM

## 2020-09-25 DIAGNOSIS — N184 Chronic kidney disease, stage 4 (severe): Secondary | ICD-10-CM | POA: Diagnosis not present

## 2020-09-25 DIAGNOSIS — R82998 Other abnormal findings in urine: Secondary | ICD-10-CM

## 2020-09-25 DIAGNOSIS — R109 Unspecified abdominal pain: Secondary | ICD-10-CM

## 2020-09-25 LAB — POCT URINALYSIS DIPSTICK
Bilirubin, UA: NEGATIVE
Blood, UA: NEGATIVE
Glucose, UA: NEGATIVE
Ketones, UA: NEGATIVE
Nitrite, UA: NEGATIVE
Protein, UA: NEGATIVE
Spec Grav, UA: 1.01 (ref 1.010–1.025)
Urobilinogen, UA: 0.2 E.U./dL
pH, UA: 5.5 (ref 5.0–8.0)

## 2020-09-25 NOTE — Patient Instructions (Signed)
Flank Pain, Adult Flank pain is pain in your side. The flank is the area of your side between your upper belly (abdomen) and your back. The pain may occur over a short time (acute), or it may be long-term or come back often (chronic). It may be mild or very bad. Pain in this area can be caused by many different things. Follow these instructions at home:  Drink enough fluid to keep your pee (urine) clear or pale yellow.  Rest as told by your doctor.  Take over-the-counter and prescription medicines only as told by your doctor.  Keep a journal to keep track of: ? What has caused your flank pain. ? What has made it feel better.  Keep all follow-up visits as told by your doctor. This is important.   Contact a doctor if:  Medicine does not help your pain.  You have new symptoms.  Your pain gets worse.  You have a fever.  Your symptoms last longer than 2-3 days.  You have trouble peeing.  You are peeing more often than normal. Get help right away if:  You have trouble breathing.  You are short of breath.  Your belly hurts, or it is swollen or red.  You feel sick to your stomach (nauseous).  You throw up (vomit).  You feel like you will pass out, or you do pass out (faint).  You have blood in your pee. Summary  Flank pain is pain in your side. The flank is the area of your side between your upper belly (abdomen) and your back.  Flank pain may occur over a short time (acute), or it may be long-term or come back often (chronic). It may be mild or very bad.  Pain in this area can be caused by many different things.  Contact your doctor if your symptoms get worse or they last longer than 2-3 days. This information is not intended to replace advice given to you by your health care provider. Make sure you discuss any questions you have with your health care provider. Document Revised: 01/13/2020 Document Reviewed: 01/13/2020 Elsevier Patient Education  2021 [employer]  Inc.  

## 2020-09-25 NOTE — Progress Notes (Signed)
I,Tianna Badgett,acting as a Education administrator for [employer], NP.,have documented all relevant documentation on the behalf of [employer], NP,as directed by  Bary Castilla, NP while in the presence of Bary Castilla, NP.  This visit occurred during the SARS-CoV-2 public health emergency.  Safety protocols were in place, including screening questions prior to the visit, additional usage of staff PPE, and extensive cleaning of exam room while observing appropriate contact time as indicated for disinfecting solutions.  Subjective:     Patient ID: Yvonne Miller , female    DOB: 05/29/1950 , 70 y.o.   MRN: [id number]   Chief Complaint  Patient presents with  . Flank Pain    HPI  Patient is here for flank pain. She also has a little pain before urination. This is has been going on for a long time. She has had repetitive UTI's. She is also seeing a cardiologist. She is now in CKD stage 4.  She reports no burning but a weak stream.   Flank Pain Pertinent negatives include no abdominal pain, chest pain, dysuria, fever, headaches, numbness or weakness.     Past Medical History:  Diagnosis Date  . AICD (automatic cardioverter/defibrillator) present   . Arthritis   . Asthma   . Carpal tunnel syndrome, bilateral   . CKD (chronic kidney disease)   . Diabetes mellitus   . Encounter for assessment of implantable cardioverter-defibrillator (ICD)   . GERD (gastroesophageal reflux disease)   . Hyperlipemia   . Hypertension   . Obesity    morbid  . Sleep apnea    wears CPAP set at 12  . Wears glasses      Family History  Problem Relation Age of Onset  . Hypertension Mother   . Stroke Mother   . Heart disease Father   . Heart attack Sister   . Heart attack Brother      Current Outpatient Medications:  .  ACCU-CHEK AVIVA PLUS test strip, CHECK 4 TIMES BY INTRADERMAL ROUTE EVERY DAY, Disp: 200 strip, Rfl: 11 .  albuterol (PROVENTIL HFA;VENTOLIN HFA) 108 (90 Base) MCG/ACT  inhaler, Inhale 2 puffs into the lungs every 4 (four) hours as needed for wheezing or shortness of breath. , Disp: , Rfl:  .  amLODipine (NORVASC) 5 MG tablet, TAKE 1 TABLET BY MOUTH  DAILY, Disp: 90 tablet, Rfl: 3 .  aspirin EC 81 MG tablet, Take 81 mg by mouth at bedtime. , Disp: , Rfl:  .  B Complex Vitamins (B COMPLEX-B12) TABS, Take 1 tablet by mouth daily., Disp: , Rfl:  .  carvedilol (COREG) 25 MG tablet, TAKE 1 AND 1/2 TABLETS BY  MOUTH TWICE DAILY (Patient taking differently: No sig reported), Disp: 270 tablet, Rfl: 3 .  cephALEXin (KEFLEX) 500 MG capsule, One tab po tid x 5 days, Disp: 15 capsule, Rfl: 0 .  Cholecalciferol (VITAMIN D) 50 MCG (2000 UT) tablet, Take 2,000 Units by mouth daily., Disp: , Rfl:  .  Chromium 1000 MCG TABS, Take 1,000 mcg by mouth daily. , Disp: , Rfl:  .  Cinnamon 500 MG capsule, Take 500 mg by mouth daily., Disp: , Rfl:  .  fluticasone (FLONASE) 50 MCG/ACT nasal spray, Place 1 spray into both nostrils daily. (Patient taking differently: Place 1 spray into both nostrils as needed for allergies.), Disp: 16 g, Rfl: 2 .  furosemide (LASIX) 40 MG tablet, Take 1 tablet (40 mg total) by mouth every morning. One tab twice daily for 1 week, Disp: 60  tablet, Rfl: 2 .  hydrALAZINE (APRESOLINE) 50 MG tablet, TAKE 1 TABLET BY MOUTH  TWICE DAILY, Disp: 180 tablet, Rfl: 3 .  Insulin Lispro Prot & Lispro (HUMALOG MIX 75/25 KWIKPEN) (75-25) 100 UNIT/ML Kwikpen, Inject 45 Units into the skin at bedtime., Disp: 45 mL, Rfl: 3 .  nystatin (NYSTATIN) powder, Apply topically 3 (three) times daily as needed. As needed, Disp: 60 g, Rfl: 2 .  Omega-3 Fatty Acids (FISH OIL) 500 MG CAPS, Take 500 mg by mouth daily., Disp: , Rfl:  .  omeprazole (PRILOSEC) 40 MG capsule, Take 40 mg by mouth daily., Disp: , Rfl:  .  pravastatin (PRAVACHOL) 40 MG tablet, TAKE 1 TABLET BY MOUTH  DAILY, Disp: 90 tablet, Rfl: 3 .  Probiotic Product (PROBIOTIC PO), Take by mouth., Disp: , Rfl:  .  ROCKLATAN  0.02-0.005 % SOLN, SMARTSIG:1 Drop(s) In Eye(s) Every Evening, Disp: , Rfl:  .  TRULICITY 1.5 IO/9.6EX SOPN, INJECT 1.5 MG INTO THE SKIN ONCE A WEEK. (Patient taking differently: Inject 1.5 mg as directed every Wednesday.), Disp: 2 pen, Rfl: 2 .  valsartan-hydrochlorothiazide (DIOVAN-HCT) 160-25 MG tablet, TAKE 1 TABLET BY MOUTH  DAILY, Disp: 90 tablet, Rfl: 3   Allergies  Allergen Reactions  . Other Other (See Comments)    NO BLOOD   . Jehovah witness     Review of Systems  Constitutional: Negative.  Negative for chills and fever.  HENT: Negative for rhinorrhea, sinus pressure, sinus pain and tinnitus.   Respiratory: Negative.  Negative for cough, shortness of breath and wheezing.   Cardiovascular: Negative.  Negative for chest pain and palpitations.  Gastrointestinal: Negative.  Negative for abdominal pain, diarrhea and nausea.  Genitourinary: Positive for decreased urine volume and flank pain. Negative for dysuria and hematuria.  Musculoskeletal: Positive for back pain.  Neurological: Negative.  Negative for seizures, weakness, numbness and headaches.     Today's Vitals   09/25/20 1209  BP: 120/70  Pulse: 74  Temp: 98.7 F (37.1 C)  TempSrc: Oral  Weight: 277 lb 12.8 oz (126 kg)  Height: 5' 2.6" (1.59 m)   Body mass index is 49.84 kg/m.   Objective:  Physical Exam Constitutional:      Appearance: Normal appearance.  HENT:     Head: Normocephalic and atraumatic.  Cardiovascular:     Rate and Rhythm: Normal rate and regular rhythm.     Pulses: Normal pulses.     Heart sounds: Normal heart sounds. No murmur heard.   Pulmonary:     Effort: Pulmonary effort is normal. No respiratory distress.     Breath sounds: Normal breath sounds. No wheezing.  Skin:    General: Skin is warm and dry.     Capillary Refill: Capillary refill takes less than 2 seconds.  Neurological:     General: No focal deficit present.     Mental Status: She is alert and oriented to person, place,  and time.  Psychiatric:        Mood and Affect: Mood normal.        Behavior: Behavior normal.         Assessment And Plan:     1. Flank pain -Patient reports no fever or burning. Will check her for UTI and send for culture. She has a history of UTI's. Due to decrease CKD will also order renal US and referral to nephrologist.  - POCT Urinalysis Dipstick (52841) - US Renal; Future - Urine cytology ancillary only  2. CKD (chronic  kidney disease) stage 4, GFR 15-29 ml/min (HCC) Will send referral for nephrologist. And also do US renal. And check labs for decreased CKD including PTH, Protein, SPEP  - US Renal; Future - Ambulatory referral to Nephrology - PTH, Intact and Calcium - Protein electrophoresis, serum  3. Leukocytes in urine -Will send for urinalysis and treat once it is back. -She has been treated for UTI twice in the past 2 months.   -Refer her to nephrologist   - US Renal; Future - Urine cytology ancillary only   The patient was encouraged to call or send a message through [place] for any questions or concerns.   Patient was given opportunity to ask questions. Patient verbalized understanding of the plan and was able to repeat key elements of the plan. All questions were answered to their satisfaction.  Raman Marcille Barman, DNP   I, Raman Shubh Chiara have reviewed all documentation for this visit. The documentation on 09/25/20 for the exam, diagnosis, procedures, and orders are all accurate and complete.    IF YOU HAVE BEEN REFERRED TO A SPECIALIST, IT MAY TAKE 1-2 WEEKS TO SCHEDULE/PROCESS THE REFERRAL. IF YOU HAVE NOT HEARD FROM US/SPECIALIST IN TWO WEEKS, PLEASE GIVE [place] A CALL AT [phone].   THE PATIENT IS ENCOURAGED TO PRACTICE SOCIAL DISTANCING DUE TO THE COVID-19 PANDEMIC.

## 2020-09-26 ENCOUNTER — Ambulatory Visit: Payer: Medicare Other | Admitting: Cardiology

## 2020-09-26 ENCOUNTER — Encounter: Payer: Self-pay | Admitting: Cardiology

## 2020-09-26 VITALS — BP 131/66 | HR 75 | Temp 97.7°F | Resp 16 | Ht 62.0 in | Wt 278.4 lb

## 2020-09-26 DIAGNOSIS — I5042 Chronic combined systolic (congestive) and diastolic (congestive) heart failure: Secondary | ICD-10-CM | POA: Diagnosis not present

## 2020-09-26 DIAGNOSIS — I428 Other cardiomyopathies: Secondary | ICD-10-CM | POA: Diagnosis not present

## 2020-09-26 DIAGNOSIS — Z794 Long term (current) use of insulin: Secondary | ICD-10-CM | POA: Diagnosis not present

## 2020-09-26 DIAGNOSIS — Z95810 Presence of automatic (implantable) cardiac defibrillator: Secondary | ICD-10-CM | POA: Diagnosis not present

## 2020-09-26 DIAGNOSIS — N1832 Chronic kidney disease, stage 3b: Secondary | ICD-10-CM | POA: Diagnosis not present

## 2020-09-26 DIAGNOSIS — E1122 Type 2 diabetes mellitus with diabetic chronic kidney disease: Secondary | ICD-10-CM | POA: Diagnosis not present

## 2020-09-26 LAB — PTH, INTACT AND CALCIUM
Calcium: 9.7 mg/dL (ref 8.7–10.3)
PTH: 21 pg/mL (ref 15–65)

## 2020-09-26 LAB — PROTEIN ELECTROPHORESIS, SERUM
A/G Ratio: 1 (ref 0.7–1.7)
Albumin ELP: 3.9 g/dL (ref 2.9–4.4)
Alpha 1: 0.3 g/dL (ref 0.0–0.4)
Alpha 2: 0.8 g/dL (ref 0.4–1.0)
Beta: 1.2 g/dL (ref 0.7–1.3)
Gamma Globulin: 1.6 g/dL (ref 0.4–1.8)
Globulin, Total: 3.8 g/dL (ref 2.2–3.9)
Total Protein: 7.7 g/dL (ref 6.0–8.5)

## 2020-09-26 NOTE — Progress Notes (Signed)
Primary Physician/Referring:  [name], MD  Patient ID: Yvonne Miller, female    DOB: 1951-01-11, 70 y.o.   MRN: [id number]  No chief complaint on file.  HPI:    Yvonne Miller  is a 70 y.o. 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 She also has morbid obesity, OSA not on CPAP recently, hypertension, DM with stage 3b CKD, hyperlipidemia, degenerative joint disease and recent diagnosis of diabetic gastroparesis.  She presents to the office for 6-week follow-up of shortness of breath, not feeling well.  6 weeks ago I had increased her furosemide and obtain BMP and BNP which had revealed worsening renal function and hence Aldactone was discontinued.  She now presents for follow-up.  Patient has started to feel better but states that she now has recurrent UTI and continues to have bacteriuria and has now been referred for nephrology for further evaluation.  No PND or orthopnea, no further leg edema.   No chest pain or palpitations, no dizziness or syncope.  Past Medical History:  Diagnosis Date  . AICD (automatic cardioverter/defibrillator) present   . Arthritis   . Asthma   . Carpal tunnel syndrome, bilateral   . CKD (chronic kidney disease)   . Diabetes mellitus   . Encounter for assessment of implantable cardioverter-defibrillator (ICD)   . GERD (gastroesophageal reflux disease)   . Hyperlipemia   . Hypertension   . Obesity    morbid  . Sleep apnea    wears CPAP set at 12  . Wears glasses    Past Surgical History:  Procedure Laterality Date  . ABDOMINAL HYSTERECTOMY    . BIOPSY  03/01/2019   Procedure: BIOPSY;  Surgeon: Juanita Craver, MD;  Location: [facility];  Service: Endoscopy;;  . BREAST SURGERY  1968   breast mass excision  . CARPAL TUNNEL RELEASE Left 07/10/2017   Procedure: CARPAL TUNNEL RELEASE;  Surgeon: Ashok Pall, MD;  Location: [place];  Service: Neurosurgery;  Laterality: Left;  left  . CARPAL TUNNEL RELEASE Right 03/19/2018   Procedure: RIGHT CARPAL TUNNEL RELEASE;  Surgeon: Ashok Pall, MD;  Location: [place];  Service: Neurosurgery;  Laterality: Right;  right  . CERVICAL SPINE SURGERY  2006  . CHOLECYSTECTOMY    . COLONOSCOPY WITH PROPOFOL N/A 03/01/2019   Procedure: COLONOSCOPY WITH PROPOFOL;  Surgeon: Juanita Craver, MD;  Location: [facility];  Service: Endoscopy;  Laterality: N/A;  . DILATION AND CURETTAGE OF UTERUS    . ICD IMPLANT    . JOINT REPLACEMENT    . TONSILLECTOMY  1961  . TOTAL KNEE ARTHROPLASTY Left 01/26/2018   Procedure: LEFT TOTAL KNEE ARTHROPLASTY;  Surgeon: Mcarthur Rossetti, MD;  Location: [place];  Service: Orthopedics;  Laterality: Left;   Family History  Problem Relation Age of Onset  . Hypertension Mother   . Stroke Mother   . Heart disease Father   . Heart attack Sister   . Heart attack Brother     Social History   Tobacco Use  . Smoking status: Never Smoker  . Smokeless tobacco: Never Used  Substance Use Topics  . Alcohol use: No   Marital Status: Married  ROS  Review of Systems  Constitutional: Positive for malaise/fatigue. Negative for chills, decreased appetite and weight gain.  Cardiovascular: Positive for dyspnea on exertion and leg swelling (lymphedema left leg and wears support stocking). Negative for syncope.  Respiratory: Positive for snoring (sleep apnea not using CPAP).   Endocrine: Negative for cold intolerance.  Hematologic/Lymphatic: Does not bruise/bleed easily.  Musculoskeletal: Positive for arthritis (bilateral knee). Negative for joint swelling.  Gastrointestinal: Positive for change in bowel habit. Negative for abdominal pain, anorexia, hematochezia and melena.  Genitourinary: Positive for dysuria.  Neurological: Negative for headaches and light-headedness.   Objective  Blood pressure 131/66, pulse 75, temperature 97.7 F  (36.5 C), temperature source Temporal, resp. rate 16, height 5\' 2"  (1.575 m), weight 278 lb 6.4 oz (126.3 kg), SpO2 98 %.  Vitals with BMI 09/26/2020 09/25/2020 08/31/2020  Height 5\' 2"  5' 2.6" 5\' 3"   Weight 278 lbs 6 oz 277 lbs 13 oz 281 lbs  BMI 50.91 06.30 16.01  Systolic 093 235 573  Diastolic 66 70 82  Pulse 75 74 65     Physical Exam Constitutional:      Appearance: She is well-developed.     Comments: Morbidly Obese   HENT:     Head: Atraumatic.  Eyes:     Conjunctiva/sclera: Conjunctivae normal.  Neck:     Thyroid: No thyromegaly.     Vascular: No JVD.     Comments: Short neck and difficult to evaluate JVP  Cardiovascular:     Rate and Rhythm: Normal rate and regular rhythm.     Pulses: Normal pulses and intact distal pulses.          Carotid pulses are 2+ on the right side and 2+ on the left side.    Heart sounds: No murmur heard. No gallop.      Comments: Femoral and popliteal pulse difficult to feel due to patient's body habitus.  Pulmonary:     Effort: Pulmonary effort is normal. No accessory muscle usage or respiratory distress.     Breath sounds: Normal breath sounds.  Abdominal:     General: Bowel sounds are normal.     Palpations: Abdomen is soft.     Comments: Obese. Pannus present   Musculoskeletal:        General: Swelling (Edema: Non pitting left leg below knee present. Trace right leg edema.  ) present. Normal range of motion.     Cervical back: Neck supple.  Skin:    General: Skin is warm and dry.  Neurological:     Mental Status: She is alert.    Laboratory examination:   Recent Labs    10/05/19 1014 02/07/20 1050 06/21/20 1137 08/31/20 1036 09/14/20 1330 09/25/20 1244  NA 135 137 138 142 137  --   K 5.2 4.8 4.7 5.5* 4.3  --   CL 98 100 103 104 98  --   CO2 22 22 19* 24 23  --   GLUCOSE 120* 74 129* 136* 179*  --   BUN 45* 27 22 27  42*  --   CREATININE 1.94* 1.38* 1.37* 1.57* 1.97*  --   CALCIUM 9.3 9.5 9.3 9.6 9.1 9.7  GFRNONAA 26*  39* 39*  --   --   --   GFRAA 30* 45* 45*  --   --   --    estimated creatinine clearance is 33.3 mL/min (A) (by C-G formula based on SCr of 1.97 mg/dL (H)).  CMP Latest Ref Rng & Units 09/25/2020 09/14/2020 08/31/2020  Glucose 65 - 99 mg/dL - 179(H) 136(H)  BUN 8 - 27 mg/dL - 42(H) 27  Creatinine 0.57 - 1.00 mg/dL - 1.97(H) 1.57(H)  Sodium 134 - 144 mmol/L - 137 142  Potassium 3.5 - 5.2 mmol/L - 4.3 5.5(H)  Chloride 96 - 106 mmol/L - 98 104  CO2 20 - 29 mmol/L - 23 24  Calcium 8.7 - 10.3 mg/dL 9.7 9.1 9.6  Total Protein 6.0 - 8.5 g/dL 7.7 - -  Total Bilirubin 0.0 - 1.2 mg/dL - - -  Alkaline Phos 44 - 121 IU/L - - -  AST 0 - 40 IU/L - - -  ALT 0 - 32 IU/L - - -   CBC Latest Ref Rng & Units 06/21/2020 10/05/2019 09/30/2018  WBC 3.4 - 10.8 x10E3/uL 10.5 10.6 9.3  Hemoglobin 11.1 - 15.9 g/dL 11.6 11.5 12.0  Hematocrit 34.0 - 46.6 % 36.8 36.1 36.0  Platelets 150 - 450 x10E3/uL 333 328 367   Lipid Panel     Component Value Date/Time   CHOL 124 06/21/2020 1137   TRIG 117 06/21/2020 1137   HDL 42 06/21/2020 1137   CHOLHDL 3.0 06/21/2020 1137   CHOLHDL 3.8 04/22/2009 0603   VLDL 45 (H) 04/22/2009 0603   LDLCALC 61 06/21/2020 1137   HEMOGLOBIN A1C Lab Results  Component Value Date   HGBA1C 7.7 (H) 06/21/2020   MPG 165.68 01/15/2018   TSH Recent Labs    06/21/20 1137  TSH 1.110   BNP (last 3 results) Recent Labs    08/31/20 1037 09/14/20 1331  BNP 44.3 28.6   Medications and allergies   Allergies  Allergen Reactions  . Other Other (See Comments)    NO BLOOD   . Jehovah witness     Current Outpatient Medications  Medication Instructions  . ACCU-CHEK AVIVA PLUS test strip CHECK 4 TIMES BY INTRADERMAL ROUTE EVERY DAY  . albuterol (PROVENTIL HFA;VENTOLIN HFA) 108 (90 Base) MCG/ACT inhaler 2 puffs, Inhalation, Every 4 hours PRN  . amLODipine (NORVASC) 5 MG tablet TAKE 1 TABLET BY MOUTH  DAILY  . aspirin EC 81 mg, Oral, Daily at bedtime  . B Complex Vitamins (B  COMPLEX-B12) TABS 1 tablet, Oral, Daily  . carvedilol (COREG) 25 MG tablet TAKE 1 AND 1/2 TABLETS BY  MOUTH TWICE DAILY  . Chromium 1,000 mcg, Oral, Daily  . Cinnamon 500 mg, Oral, Daily  . Fish Oil 500 mg, Oral, Daily  . fluticasone (FLONASE) 50 MCG/ACT nasal spray 1 spray, Each Nare, Daily  . furosemide (LASIX) 40 mg, Oral, BH-each morning, One tab twice daily for 1 week  . hydrALAZINE (APRESOLINE) 50 MG tablet TAKE 1 TABLET BY MOUTH  TWICE DAILY  . Insulin Lispro Prot & Lispro (HUMALOG MIX 75/25 KWIKPEN) (75-25) 100 UNIT/ML Kwikpen 45 Units, Subcutaneous, Nightly  . nystatin (NYSTATIN) powder Topical, 3 times daily PRN, As needed  . omeprazole (PRILOSEC) 40 mg, Oral, Daily  . pravastatin (PRAVACHOL) 40 MG tablet TAKE 1 TABLET BY MOUTH  DAILY  . Probiotic Product (PROBIOTIC PO) Oral  . ROCKLATAN 0.02-0.005 % SOLN SMARTSIG:1 Drop(s) In Eye(s) Every Evening  . TRULICITY 1.5 XK/4.8JE SOPN INJECT 1.5 MG INTO THE SKIN ONCE A WEEK.  . valsartan-hydrochlorothiazide (DIOVAN-HCT) 160-25 MG tablet 1 tablet, Oral, Daily  . Vitamin D 2,000 Units, Oral, Daily   Radiology:   No results found.  Cardiac Studies:   Sleep study 2010 (sleep apnea-has a CPAP but doesn't use it every night).  Coronary Angiography  R+ L 03/04/11: and in 2010 Normal coronary arteries. Moderate pulmonary hypertension.  Echocardiogram [07/06/2014]:  1. Left ventricle cavity is minimally dilated. Mild concentric hypertrophy of the left ventricle. Normal global wall motion. Doppler evidence of  grade I (impaired) diastolic dysfunction. Normal systolic function. Calculated EF 55%. 2. Left atrial cavity is mild to moderately dilated. 3. Trace mitral regurgitation. 4. Mild tricuspid regurgitation. No evidence of pulmonary hypertension. 5. c.f. echo. of 09/12/2013, there is remarkable improvement in LV syst. function, EF has improved from 28% to 55%.  Scheduled  In office ICD 08/31/20:  Single (S)/Dual (D)/BV (M)  B Presenting ASBP Pacer dependant: No. Underlying NSR @ 70/min. AP 0%, BP 100.% AMS Episodes 0. HVR 0.  Longevity 3.5 Years/Voltage.  Lead measurements: Stable Histogram: Low (L)/normal (N)/high (H)  Normal Patient activity Normal. Thoracic impedance: NA  Observations: Normal ICD function.  Changes: None   Scheduled Remote ICD check  04/19/2020:  No HVR episodes. Health trends (patient activity, heart rate variability, average heart rates) are stable. The review of the implantable cardiac monitoring data remains stable. Battery longevity is 3.5 years. RA pacing is 0 %, RV pacing is 99 %, and LV pacing is 100 %.   Schedule remote biventricular ICD transmission 09/02/2020: Longevity >3 years. Lead impedance and thresholds within normal limits. AP <1%, BP 100%. There were no high atrial rate or ventricular rate episodes.   EKG   EKG 08/31/2020: Atrially sensed and biventricular paced rhythm.  No further analysis.  No significant change from 4/29/202  Assessment     ICD-10-CM   1. Non-ischemic cardiomyopathy (HCC)  I42.8 PCV ECHOCARDIOGRAM COMPLETE  2. Chronic combined systolic and diastolic heart failure (HCC)  I50.42 PCV ECHOCARDIOGRAM COMPLETE  3. ICD: Biventricular  [employer] Dynagen X4 CRT-D Model G158 ICD in place 10/28/13  Z95.810   4. Type 2 diabetes mellitus with stage 3b chronic kidney disease, with long-term current use of insulin (HCC)  E11.22    N18.32    Z79.4   5. ICD -  Biventricular  [employer] Dynagen X4 CRT-D Model G158 ICD in place 10/28/13  Z95.810     Orders Placed This Encounter  Procedures  . PCV ECHOCARDIOGRAM COMPLETE    Standing Status:   Future    Standing Expiration Date:   09/26/2021    No orders of the defined types were placed in this encounter.   Medications Discontinued During This Encounter  Medication Reason  . cephALEXin (KEFLEX) 500 MG capsule Error    Recommendations:   Yvonne Miller  is a 70 y.o. Edema: Non pitting  left leg below knee present. Trace right leg edema.   She is having frequent stools and also frequent UTI due to frequent stools.  Patient's ICD was evaluated, normal function.  Patient is in acute decompensated heart failure, I reviewed her BMP, renal function is slightly worsening over the past 2 months.  We will discontinue spironolactone, I will add furosemide 40 mg twice daily for 1 week followed by 40 mg once a day.  We will repeat BMP and also BNP in 2 weeks prior to her seeing me back in 3 weeks.  Suspect her acute decompensation is probably related to recent gastroparesis from diabetes and frequent stools and frequent UTI and has not been referred for urology evaluation.  Although with diuretics her symptoms are mildly improved, evaluation of BNP does not suggest acute decompensated heart failure.  Suspect her shortness of breath and probable diastolic heart failure would have been contributed by recurrent UTI, morbid obesity with obesity hypoventilation and recurrent UTI.  I have discussed with her regarding trying to lose 10 pounds in weight should help.  I also will obtain echocardiogram to evaluate  her LV systolic function.  Unless she has worsening dyspnea, recurrence of leg edema I will see her back in 3 months for close monitoring.  Her ICD unfortunately does not have physiologic parameters.  Unable to detect heart failure.  BiV ICD is functioning normally.  Continue remote transmissions every 3 months.    Adrian Prows, MD, [facility] 09/26/2020, 9:30 PM Office: [phone] Pager: [phone]

## 2020-09-28 LAB — URINE CYTOLOGY ANCILLARY ONLY
Candida Urine: NEGATIVE
Chlamydia: NEGATIVE
Comment: NEGATIVE
Comment: NORMAL
Neisseria Gonorrhea: NEGATIVE

## 2020-10-03 ENCOUNTER — Other Ambulatory Visit: Payer: Medicare Other

## 2020-10-03 DIAGNOSIS — R82998 Other abnormal findings in urine: Secondary | ICD-10-CM

## 2020-10-06 ENCOUNTER — Other Ambulatory Visit: Payer: Self-pay | Admitting: Cardiology

## 2020-10-06 DIAGNOSIS — I10 Essential (primary) hypertension: Secondary | ICD-10-CM

## 2020-10-08 ENCOUNTER — Other Ambulatory Visit: Payer: Self-pay

## 2020-10-08 LAB — URINE CULTURE

## 2020-10-08 MED ORDER — CARVEDILOL 25 MG PO TABS: 37.5000 mg | ORAL_TABLET | Freq: Two times a day (BID) | ORAL | 3 refills | Status: DC

## 2020-10-09 ENCOUNTER — Other Ambulatory Visit: Payer: Self-pay | Admitting: Nurse Practitioner

## 2020-10-09 ENCOUNTER — Telehealth: Payer: Self-pay

## 2020-10-09 ENCOUNTER — Other Ambulatory Visit: Payer: Medicare Other

## 2020-10-09 DIAGNOSIS — N3000 Acute cystitis without hematuria: Secondary | ICD-10-CM

## 2020-10-09 MED ORDER — AMOXICILLIN-POT CLAVULANATE 875-125 MG PO TABS: 1.0000 | ORAL_TABLET | Freq: Two times a day (BID) | ORAL | 0 refills | Status: AC

## 2020-10-09 NOTE — Chronic Care Management (AMB) (Signed)
    Chronic Care Management Pharmacy Assistant   Name: ABBIGAYLE TOOLE  MRN: [id number] DOB: [birth year]   10/09/2020- Patient called to remind of appointment with [name], CPP on 10/10/2020 at 8:30 am.  No answer, left message of appointment date, time and type of appointment (either telephone or in person). Left message to have all medications, supplements, blood pressure and/or blood sugar logs available during appointment and to return call if need to reschedule.   Star Rating Drug: Trulicity- Last filled 11/08/6149 for 28 day supply at [facility]- PAP Pravastatin 40 mg- Last filled 09/20/2020 for 90 day supply at [facility] filled 09/20/2020 for 90 day supply at [facility] delivery Metformin ER 500 mg- Last filled 10/11/2017 for 90 day supply.-NO LONGER TAKING   Any gaps in medications fill history? Concerns of fill history for Amlodipine, Pravastatin and Valsartan-HCTZ.   SIG: [name], [place] Pharmacist Assistant [phone]

## 2020-10-09 NOTE — Progress Notes (Signed)
Antibiotic sent to pharmacy.  

## 2020-10-10 ENCOUNTER — Other Ambulatory Visit: Payer: Self-pay | Admitting: Internal Medicine

## 2020-10-10 ENCOUNTER — Ambulatory Visit
Admission: RE | Admit: 2020-10-10 | Discharge: 2020-10-10 | Disposition: A | Discharge status: Home / Self Care | Payer: Medicare Other | Source: Ambulatory Visit | Attending: Nurse Practitioner | Admitting: Nurse Practitioner

## 2020-10-10 ENCOUNTER — Encounter: Payer: Self-pay | Admitting: Internal Medicine

## 2020-10-10 ENCOUNTER — Other Ambulatory Visit: Payer: Self-pay

## 2020-10-10 ENCOUNTER — Ambulatory Visit (INDEPENDENT_AMBULATORY_CARE_PROVIDER_SITE_OTHER): Payer: Medicare Other | Admitting: Internal Medicine

## 2020-10-10 ENCOUNTER — Ambulatory Visit (INDEPENDENT_AMBULATORY_CARE_PROVIDER_SITE_OTHER): Payer: Medicare Other

## 2020-10-10 VITALS — BP 116/78 | HR 73 | Temp 97.9°F | Ht 63.0 in | Wt 277.0 lb

## 2020-10-10 DIAGNOSIS — Z6841 Body Mass Index (BMI) 40.0 and over, adult: Secondary | ICD-10-CM

## 2020-10-10 DIAGNOSIS — E1122 Type 2 diabetes mellitus with diabetic chronic kidney disease: Secondary | ICD-10-CM

## 2020-10-10 DIAGNOSIS — R82998 Other abnormal findings in urine: Secondary | ICD-10-CM

## 2020-10-10 DIAGNOSIS — N390 Urinary tract infection, site not specified: Secondary | ICD-10-CM | POA: Insufficient documentation

## 2020-10-10 DIAGNOSIS — N1832 Chronic kidney disease, stage 3b: Secondary | ICD-10-CM

## 2020-10-10 DIAGNOSIS — N2889 Other specified disorders of kidney and ureter: Secondary | ICD-10-CM | POA: Diagnosis not present

## 2020-10-10 DIAGNOSIS — N184 Chronic kidney disease, stage 4 (severe): Secondary | ICD-10-CM

## 2020-10-10 DIAGNOSIS — H6121 Impacted cerumen, right ear: Secondary | ICD-10-CM

## 2020-10-10 DIAGNOSIS — R109 Unspecified abdominal pain: Secondary | ICD-10-CM

## 2020-10-10 DIAGNOSIS — Z0000 Encounter for general adult medical examination without abnormal findings: Secondary | ICD-10-CM | POA: Diagnosis not present

## 2020-10-10 DIAGNOSIS — E66812 Obesity, class 2: Secondary | ICD-10-CM | POA: Insufficient documentation

## 2020-10-10 DIAGNOSIS — Z794 Long term (current) use of insulin: Secondary | ICD-10-CM

## 2020-10-10 DIAGNOSIS — R1013 Epigastric pain: Secondary | ICD-10-CM | POA: Diagnosis not present

## 2020-10-10 DIAGNOSIS — I130 Hypertensive heart and chronic kidney disease with heart failure and stage 1 through stage 4 chronic kidney disease, or unspecified chronic kidney disease: Secondary | ICD-10-CM

## 2020-10-10 DIAGNOSIS — R911 Solitary pulmonary nodule: Secondary | ICD-10-CM

## 2020-10-10 DIAGNOSIS — E785 Hyperlipidemia, unspecified: Secondary | ICD-10-CM | POA: Diagnosis not present

## 2020-10-10 LAB — POCT UA - MICROALBUMIN
Albumin/Creatinine Ratio, Urine, POC: <30
Creatinine, POC: 100 mg/dL
Microalbumin Ur, POC: 30 mg/L

## 2020-10-10 NOTE — Progress Notes (Signed)
Chronic Care Management Pharmacy Note  10/30/2020 Name:  Yvonne Miller MRN:  [id number] DOB:  07/04/1950  Summary: Patient reports for her follow up visit with the pharmacist for CCM services.   Recommendations/Changes made from today's visit: Recommend patient receive shingles vaccine  Plan: Patient will determine if she is ready to receive the shingles vaccine.   Subjective: Yvonne Miller is an 70 y.o. year old female who is a primary patient of [name], MD.  The CCM team was consulted for assistance with disease management and care coordination needs.    Engaged with patient by telephone for follow up visit in response to provider referral for pharmacy case management and/or care coordination services.   Consent to Services:  The patient was given information about Chronic Care Management services, agreed to services, and gave verbal consent prior to initiation of services.  Please see initial visit note for detailed documentation.   Patient Care Team: [name], MD as PCP - General (Internal Medicine) Lynne Logan, RN as Case Manager [name], [facility] (Pharmacist)  Recent office visits: 10/10/2020 PCP [facility] visits: None in previous 6 months   Objective:  Lab Results  Component Value Date   CREATININE 1.97 (H) 09/14/2020   BUN 42 (H) 09/14/2020   GFRNONAA 39 (L) 06/21/2020   GFRAA 45 (L) 06/21/2020   NA 137 09/14/2020   K 4.3 09/14/2020   CALCIUM 9.7 09/25/2020   CO2 23 09/14/2020   GLUCOSE 179 (H) 09/14/2020    Lab Results  Component Value Date/Time   HGBA1C 8.0 (H) 10/10/2020 10:41 AM   HGBA1C 7.7 (H) 06/21/2020 11:37 AM   MICROALBUR 30 10/10/2020 12:38 PM   MICROALBUR 30 10/05/2019 12:07 PM    Last diabetic Eye exam:  Lab Results  Component Value Date/Time   HMDIABEYEEXA No Retinopathy 12/02/2019 12:00 AM    Last diabetic Foot exam: No results found for: HMDIABFOOTEX   Lab Results  Component Value Date   CHOL 124  06/21/2020   HDL 42 06/21/2020   LDLCALC 61 06/21/2020   TRIG 117 06/21/2020   CHOLHDL 3.0 06/21/2020    Hepatic Function Latest Ref Rng & Units 10/10/2020 09/25/2020 06/21/2020  Total Protein 6.0 - 8.5 g/dL 7.6 7.7 7.3  Albumin 3.7 - 4.7 g/dL 4.5 - 4.0  AST 0 - 40 IU/L 19 - 14  ALT 0 - 32 IU/L 15 - 15  Alk Phosphatase 44 - 121 IU/L 105 - 107  Total Bilirubin 0.0 - 1.2 mg/dL 0.2 - 0.2  Bilirubin, Direct 0.00 - 0.40 mg/dL <0.10 - -      CBC Latest Ref Rng & Units 06/21/2020 10/05/2019 09/30/2018  WBC 3.4 - 10.8 x10E3/uL 10.5 10.6 9.3  Hemoglobin 11.1 - 15.9 g/dL 11.6 11.5 12.0  Hematocrit 34.0 - 46.6 % 36.8 36.1 36.0  Platelets 150 - 450 x10E3/uL 333 328 367    No results found for: VD25OH  Clinical ASCVD: No  The ASCVD Risk score Mikey Bussing DC Jr., et al., 2013) failed to calculate for the following reasons:   The valid total cholesterol range is 130 to 320 mg/dL    Depression screen [facility] 2/9 06/21/2020 10/05/2019 06/16/2019  Decreased Interest 0 0 0  Down, Depressed, Hopeless 0 0 0  PHQ - 2 Score 0 0 0  Altered sleeping - - 0  Tired, decreased energy - - 0  Change in appetite - - 0  Feeling bad or failure about yourself  - -  0  Trouble concentrating - - 0  Moving slowly or fidgety/restless - - 0  Suicidal thoughts - - 0  PHQ-9 Score - - 0  Difficult doing work/chores - - Not difficult at all     Social History   Tobacco Use  Smoking Status Never  Smokeless Tobacco Never   BP Readings from Last 3 Encounters:  10/10/20 116/78  09/26/20 131/66  09/25/20 120/70   Pulse Readings from Last 3 Encounters:  10/10/20 73  09/26/20 75  09/25/20 74   Wt Readings from Last 3 Encounters:  10/10/20 277 lb (125.6 kg)  09/26/20 278 lb 6.4 oz (126.3 kg)  09/25/20 277 lb 12.8 oz (126 kg)   BMI Readings from Last 3 Encounters:  10/10/20 49.07 kg/m  09/26/20 50.92 kg/m  09/25/20 49.84 kg/m    Assessment/Interventions: Review of patient past medical history, allergies,  medications, health status, including review of consultants reports, laboratory and other test data, was performed as part of comprehensive evaluation and provision of chronic care management services.   SDOH:  (Social Determinants of Health) assessments and interventions performed: No  SDOH Screenings   Alcohol Screen: Not on file  Depression (PHQ2-9): Low Risk    PHQ-2 Score: 0  Financial Resource Strain: Low Risk    Difficulty of Paying Living Expenses: Not hard at all  Food Insecurity: No Food Insecurity   Worried About Charity fundraiser in the Last Year: Never true   Ran Out of Food in the Last Year: Never true  Housing: Not on file  Physical Activity: Insufficiently Active   Days of Exercise per Week: 3 days   Minutes of Exercise per Session: 30 min  Social Connections: Not on file  Stress: No Stress Concern Present   Feeling of Stress : Not at all  Tobacco Use: Low Risk    Smoking Tobacco Use: Never   Smokeless Tobacco Use: Never  Transportation Needs: No Transportation Needs   Lack of Transportation (Medical): No   Lack of Transportation (Non-Medical): No    CCM Care Plan  Allergies  Allergen Reactions   Other Other (See Comments)    NO BLOOD   . Jehovah witness    Medications Reviewed Today     Reviewed by [name], MD (Physician) on 10/10/20 at [place] List Status: <None>   Medication Order Taking? Sig Documenting Provider Last Dose Status Informant  ACCU-CHEK AVIVA PLUS test strip [id number] Yes CHECK 4 TIMES BY INTRADERMAL ROUTE EVERY DAY [name], MD Taking Active   albuterol (PROVENTIL HFA;VENTOLIN HFA) 108 (90 Base) MCG/ACT inhaler [id number] Yes Inhale 2 puffs into the lungs every 4 (four) hours as needed for wheezing or shortness of breath.  [provider] Taking Active Self  amLODipine (NORVASC) 5 MG tablet [id number] Yes TAKE 1 TABLET BY MOUTH  DAILY Adrian Prows, MD Taking Active   amoxicillin-clavulanate (AUGMENTIN) 875-125 MG  tablet [id number] Yes Take 1 tablet by mouth 2 (two) times daily for 7 days. Bary Castilla, NP Taking Active   aspirin EC 81 MG tablet [id number] Yes Take 81 mg by mouth at bedtime.  [provider] Taking Active Self           Med Note Michelle Nasuti   Thu Jun 16, 2019  8:38 AM)    B Complex Vitamins (B COMPLEX-B12) TABS [id number] Yes Take 1 tablet by mouth daily. [provider] Taking Active Self           Med Note (  HARRINGTON, APRIL   Thu Oct 21, 2018  8:17 AM)    carvedilol (COREG) 25 MG tablet [id number] Yes Take 1.5 tablets (37.5 mg total) by mouth 2 (two) times daily. Adrian Prows, MD Taking Active   Cholecalciferol (VITAMIN D) 50 MCG (2000 UT) tablet [id number] Yes Take 2,000 Units by mouth daily. [provider] Taking Active   Chromium 1000 MCG TABS [id number] Yes Take 1,000 mcg by mouth daily.  [provider] Taking Active   Cinnamon 500 MG capsule [id number] Yes Take 500 mg by mouth daily. [provider] Taking Active Self           Med Note Jannifer Rodney, APRIL   Thu Oct 21, 2018  8:17 AM)    fluticasone (FLONASE) 50 MCG/ACT nasal spray [id number]  Place 1 spray into both nostrils daily.  Patient taking differently: Place 1 spray into both nostrils as needed for allergies.   [name], MD  Expired 02/07/20 2359   furosemide (LASIX) 40 MG tablet [id number] Yes Take 1 tablet (40 mg total) by mouth every morning. One tab twice daily for 1 week Adrian Prows, MD Taking Active   hydrALAZINE (APRESOLINE) 50 MG tablet [id number] Yes TAKE 1 TABLET BY MOUTH  TWICE DAILY Adrian Prows, MD Taking Active   Insulin Lispro Prot & Lispro (HUMALOG MIX 75/25 KWIKPEN) (75-25) 100 UNIT/ML Claiborne Rigg [id number] Yes Inject 45 Units into the skin at bedtime. [name], MD Taking Active   nystatin (NYSTATIN) powder [id number] Yes Apply topically 3 (three) times daily as needed. As needed [name], MD Taking Active   Omega-3 Fatty Acids (FISH OIL) 500 MG CAPS  [id number] Yes Take 500 mg by mouth daily. [provider] Taking Active   omeprazole (PRILOSEC) 40 MG capsule [id number] Yes Take 40 mg by mouth daily. [provider] Taking Active   pravastatin (PRAVACHOL) 40 MG tablet [id number] Yes TAKE 1 TABLET BY MOUTH  DAILY [name], MD Taking Active   Probiotic Product (PROBIOTIC PO) [id number] Yes Take by mouth. [provider] Taking Active   ROCKLATAN 0.02-0.005 % SOLN [id number] Yes SMARTSIG:1 Drop(s) In Eye(s) Every Evening [provider] Taking Active   TRULICITY 1.5 DQ/2.2WL SOPN [id number] Yes INJECT 1.5 MG INTO THE SKIN ONCE A WEEK.  Patient taking differently: Inject 1.5 mg as directed every Wednesday.   [name], MD Taking Active   valsartan-hydrochlorothiazide (DIOVAN-HCT) 160-25 MG tablet [id number] Yes TAKE 1 TABLET BY MOUTH  DAILY [name], MD Taking Active             Patient Active Problem List   Diagnosis Date Noted   Recurrent UTI 10/10/2020   Class 3 severe obesity due to excess calories with serious comorbidity and body mass index (BMI) of 45.0 to 49.9 in adult [facility]) 10/10/2020   Non-ischemic cardiomyopathy ([place]) 02/18/2019   Encounter for assessment of implantable cardioverter-defibrillator (ICD)    Type 2 diabetes mellitus with stage 3 chronic kidney disease, with long-term current use of insulin ([place]) 03/04/2018   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   Chronic combined systolic and diastolic congestive heart failure ([place]) 03/04/2018   Chronic renal disease, stage II 03/04/2018   Status post revision of total replacement of left knee 01/26/2018   Unilateral primary osteoarthritis, left knee 08/18/2017   Biventricular automatic implantable cardioverter defibrillator in situ 12/13/2013   ICD -  Biventricular  [employer] Dynagen X4 CRT-D Model G158 ICD in place 10/28/13 10/28/2013   HTN (hypertension) 10/03/2013   Hyperlipemia 10/03/2013  LBBB (left bundle  branch block) 10/03/2013   Cardiomyopathy ([place]) 10/03/2013   DM (diabetes mellitus) ([place]) 10/03/2013    Immunization History  Administered Date(s) Administered   Fluad Quad(high Dose 65+) 02/07/2020   Hepatitis A 01/19/2012, 02/16/2012   Hepatitis B 01/19/2012, 02/16/2012   IPV 02/16/2012   Influenza, High Dose Seasonal PF 02/17/2016, 03/04/2018, 02/10/2019   Influenza-Unspecified 02/02/2014, 02/16/2014   MMR 04/04/1994   PFIZER(Purple Top)SARS-COV-2 Vaccination 06/11/2019, 07/02/2019, 02/21/2020   Pneumococcal Conjugate-13 05/16/2020   Td 09/08/2006   Tdap 01/19/2012, 06/21/2020   Zoster, Live 12/15/2007    Conditions to be addressed/monitored:  Hypertension and Diabetes  Care Plan : [place]  Updates made by [name], [place] since 10/30/2020 12:00 AM     Problem: HTN, DM II, HLD   Priority: High     Long-Range Goal: Disease Management   Start Date: 07/12/2020  Recent Progress: On track  Priority: High  Note:     Current Barriers:  Unable to independently monitor therapeutic efficacy   Pharmacist Clinical Goal(s):  Over the next 90 days, patient will verbalize ability to afford treatment regimen through collaboration with PharmD and provider.   Interventions: 1:1 collaboration with [name], MD regarding development and update of comprehensive plan of care as evidenced by provider attestation and co-signature Inter-disciplinary care team collaboration (see longitudinal plan of care) Comprehensive medication review performed; medication list updated in electronic medical record   Hypertension (BP goal <130/80) -Controlled -Current treatment: Amlodipine 5 mg tablet daily Hydralazine 50 mg tablet twice daily  Valsartan-HCTZ 160-39m take 1 tablet by mouth daily  Carvedilol 25 mg -take 1.5 tablets twice daily.   -Current home readings: she is checking her BP at home 120-140/81 over two weeks. Yesterday her BP was 121/78  -Current  dietary habits: not eating as much, she does not use salt to season her food -Denies hypotensive/hypertensive symptoms -Educated on BP goals and benefits of medications for prevention of heart attack, stroke and kidney damage; Daily salt intake goal < 2300 mg; Importance of home blood pressure monitoring; -Counseled to monitor BP at home at least one time per day, document, and provide log at future appointments -Recommended to continue current medication  Hyperlipidemia: (LDL goal < 70) -Controlled -Current treatment: Aspirin 81 mg EC- take one tablet daily. Pravastatin 40 mg take daily.  Patient is on automatic refill and she has over a month left.  -Current dietary patterns: patient reports that she is going to start keeping a food log -Current exercise habits: please see diabetes for detailed information on exercise.  -Congratulated patient on LDL <70  Importance of limiting foods high in cholesterol; Exercise goal of 150 minutes per week; -Counseled on diet and exercise extensively -Patient reports feeling full, she discussed with Dr. SBaird Cancerand was told to reduce medicine Recommended to continue current medication  Diabetes (A1c goal <7%) -Uncontrolled -Current medications: Humalog Mix 75/25 inject 45 units in to the skin at bedtime Trulicity 1.5 mg //2.5ML - Inject 0.75  mg into the skin once a week for two weeks -Current home glucose readings: checking BS 3-4 times a day during the past week fasting glucose:  125, 175,179 post prandial glucose: <180 -Denies hypoglycemic/hyperglycemic symptoms -Current meal patterns:  breakfast: 2 boiled eggs  lunch: burger with lettuce, tomato and onions dinner: less than a 1/3 of a steak and cup of macaroni and cheese drinks: drinking some water about 3 bottles of water  -Current exercise: has increased her  steps to 2000 steps or more, she is going to increase her steps.  She walks around the house, the store or walking back and forth  from the [place]. She is using an app to make sure that she is exercising and she also places her goals in the app.  -Patient reports that she has been focused using her smart watch to work out and rest.  -Patient reports that [facility] will call her and remind her to fill prescription.  -Educated on A1c and blood sugar goals; Prevention and management of hypoglycemic episodes; Benefits of routine self-monitoring of blood sugar; Carbohydrate counting and/or plate method  -Counseled to check feet daily and get yearly eye exams --Discussed proper foot examination technique -Recommended to continue current medication    Patient Goals/Self-Care Activities Patient will:  - take medications as prescribed target a minimum of 150 minutes of moderate intensity exercise weekly  Follow Up Plan:   Face to Face appointment with care management team member scheduled for:  01/08/2021 The patient has been provided with contact information for the care management team and has been advised to call with any health related questions or concerns.        Medication Assistance:  Trulicity obtained through [employer] medication assistance program.  Enrollment ends 04/2021  Compliance/Adherence/Medication fill history: Care Gaps: COVID-19 Booster  Star-Rating Drugs: Pravastatin 40 mg  Valsartan-HCTZ 160-25 mg Dulaglutide 0.75 mg   Patient's preferred pharmacy is:  [facility]  ([place], [place] [address] Phone: [phone] Fax: [phone]  [id number] [place], [place] - [address]Phone: [phone]: [phone]

## 2020-10-10 NOTE — Patient Instructions (Addendum)
Health Maintenance, Female Adopting a healthy lifestyle and getting preventive care are important in promoting health and wellness. Ask your health care provider about:  The right schedule for you to have regular tests and exams.  Things you can do on your own to prevent diseases and keep yourself healthy. What should I know about diet, weight, and exercise? Eat a healthy diet  Eat a diet that includes plenty of vegetables, fruits, low-fat dairy products, and lean protein.  Do not eat a lot of foods that are high in solid fats, added sugars, or sodium.   Maintain a healthy weight Body mass index (BMI) is used to identify weight problems. It estimates body fat based on height and weight. Your health care provider can help determine your BMI and help you achieve or maintain a healthy weight. Get regular exercise Get regular exercise. This is one of the most important things you can do for your health. Most adults should:  Exercise for at least 150 minutes each week. The exercise should increase your heart rate and make you sweat (moderate-intensity exercise).  Do strengthening exercises at least twice a week. This is in addition to the moderate-intensity exercise.  Spend less time sitting. Even light physical activity can be beneficial. Watch cholesterol and blood lipids Have your blood tested for lipids and cholesterol at 70 years of age, then have this test every 5 years. Have your cholesterol levels checked more often if:  Your lipid or cholesterol levels are high.  You are older than 71 years of age.  You are at high risk for heart disease. What should I know about cancer screening? Depending on your health history and family history, you may need to have cancer screening at various ages. This may include screening for:  Breast cancer.  Cervical cancer.  Colorectal cancer.  Skin cancer.  Lung cancer. What should I know about heart disease, diabetes, and high blood  pressure? Blood pressure and heart disease  High blood pressure causes heart disease and increases the risk of stroke. This is more likely to develop in people who have high blood pressure readings, are of African descent, or are overweight.  Have your blood pressure checked: ? Every 3-5 years if you are 18-39 years of age. ? Every year if you are 40 years old or older. Diabetes Have regular diabetes screenings. This checks your fasting blood sugar level. Have the screening done:  Once every three years after age 40 if you are at a normal weight and have a low risk for diabetes.  More often and at a younger age if you are overweight or have a high risk for diabetes. What should I know about preventing infection? Hepatitis B If you have a higher risk for hepatitis B, you should be screened for this virus. Talk with your health care provider to find out if you are at risk for hepatitis B infection. Hepatitis C Testing is recommended for:  Everyone born from 1945 through 1965.  Anyone with known risk factors for hepatitis C. Sexually transmitted infections (STIs)  Get screened for STIs, including gonorrhea and chlamydia, if: ? You are sexually active and are younger than 71 years of age. ? You are older than 71 years of age and your health care provider tells you that you are at risk for this type of infection. ? Your sexual activity has changed since you were last screened, and you are at increased risk for chlamydia or gonorrhea. Ask your health care provider   if you are at risk.  Ask your health care provider about whether you are at high risk for HIV. Your health care provider may recommend a prescription medicine to help prevent HIV infection. If you choose to take medicine to prevent HIV, you should first get tested for HIV. You should then be tested every 3 months for as long as you are taking the medicine. Pregnancy  If you are about to stop having your period (premenopausal) and  you may become pregnant, seek counseling before you get pregnant.  Take 400 to 800 micrograms (mcg) of folic acid every day if you become pregnant.  Ask for birth control (contraception) if you want to prevent pregnancy. Osteoporosis and menopause Osteoporosis is a disease in which the bones lose minerals and strength with aging. This can result in bone fractures. If you are 65 years old or older, or if you are at risk for osteoporosis and fractures, ask your health care provider if you should:  Be screened for bone loss.  Take a calcium or vitamin D supplement to lower your risk of fractures.  Be given hormone replacement therapy (HRT) to treat symptoms of menopause. Follow these instructions at home: Lifestyle  Do not use any products that contain nicotine or tobacco, such as cigarettes, e-cigarettes, and chewing tobacco. If you need help quitting, ask your health care provider.  Do not use street drugs.  Do not share needles.  Ask your health care provider for help if you need support or information about quitting drugs. Alcohol use  Do not drink alcohol if: ? Your health care provider tells you not to drink. ? You are pregnant, may be pregnant, or are planning to become pregnant.  If you drink alcohol: ? Limit how much you use to 0-1 drink a day. ? Limit intake if you are breastfeeding.  Be aware of how much alcohol is in your drink. In the [place] one drink equals one 12 oz bottle of beer (355 mL), one 5 oz glass of wine (148 mL), or one 1 oz glass of hard liquor (44 mL). General instructions  Schedule regular health, dental, and eye exams.  Stay current with your vaccines.  Tell your health care provider if: ? You often feel depressed. ? You have ever been abused or do not feel safe at home. Summary  Adopting a healthy lifestyle and getting preventive care are important in promoting health and wellness.  Follow your health care provider's instructions about healthy  diet, exercising, and getting tested or screened for diseases.  Follow your health care provider's instructions on monitoring your cholesterol and blood pressure. This information is not intended to replace advice given to you by your health care provider. Make sure you discuss any questions you have with your health care provider. Document Revised: 04/14/2018 Document Reviewed: 04/14/2018 Elsevier Patient Education  2021 [employer]  

## 2020-10-10 NOTE — Progress Notes (Signed)
I,Tianna Badgett,acting as a Education administrator for Maximino Greenland, MD.,have documented all relevant documentation on the behalf of Maximino Greenland, MD,as directed by  Maximino Greenland, MD while in the presence of Maximino Greenland, MD.  This visit occurred during the SARS-CoV-2 public health emergency.  Safety protocols were in place, including screening questions prior to the visit, additional usage of staff PPE, and extensive cleaning of exam room while observing appropriate contact time as indicated for disinfecting solutions.  Subjective:     Patient ID: Yvonne Miller , female    DOB: Oct 12, 1950 , 70 y.o.   MRN: [id number]   Chief Complaint  Patient presents with   Annual Exam   Diabetes   Hypertension    HPI  She is here today for a full physical examination. She is no longer followed by GYN. She has no specific concerns or complaints at this time. She is currently being treated for UTI. She is frustrated because she has had recurrent infections.   Additionally, she is upset b/c of discussion she had w/ GI. Unfortunately, she was advised that her sx of GI distress were due to her obesity, and not an underlying GI problem. She has been experiencing gas/bloating and upset stomach.   Diabetes She presents for her follow-up diabetic visit. She has type 2 diabetes mellitus. Her disease course has been stable. There are no hypoglycemic associated symptoms. Pertinent negatives for diabetes include no blurred vision and no chest pain. There are no hypoglycemic complications. Diabetic complications include nephropathy. Risk factors for coronary artery disease include diabetes mellitus, dyslipidemia, hypertension, obesity and sedentary lifestyle. She is compliant with treatment some of the time. She is following a generally healthy diet. She participates in exercise intermittently. Her breakfast blood glucose is taken between 8-9 am. Her breakfast blood glucose range is generally 110-130 mg/dl. An ACE  inhibitor/angiotensin II receptor blocker is being taken.  Hypertension This is a chronic problem. The current episode started more than 1 year ago. The problem has been gradually improving since onset. The problem is controlled. Pertinent negatives include no blurred vision, chest pain, palpitations or shortness of breath. Past treatments include ACE inhibitors, angiotensin blockers and diuretics. Identifiable causes of hypertension include sleep apnea.    Past Medical History:  Diagnosis Date   AICD (automatic cardioverter/defibrillator) present    Arthritis    Asthma    Carpal tunnel syndrome, bilateral    CKD (chronic kidney disease)    Diabetes mellitus    Encounter for assessment of implantable cardioverter-defibrillator (ICD)    GERD (gastroesophageal reflux disease)    Hyperlipemia    Hypertension    Obesity    morbid   Sleep apnea    wears CPAP set at 12   Wears glasses      Family History  Problem Relation Age of Onset   Hypertension Mother    Stroke Mother    Heart disease Father    Heart attack Sister    Heart attack Brother      Current Outpatient Medications:    ACCU-CHEK AVIVA PLUS test strip, CHECK 4 TIMES BY INTRADERMAL ROUTE EVERY DAY, Disp: 200 strip, Rfl: 11   albuterol (PROVENTIL HFA;VENTOLIN HFA) 108 (90 Base) MCG/ACT inhaler, Inhale 2 puffs into the lungs every 4 (four) hours as needed for wheezing or shortness of breath. , Disp: , Rfl:    amLODipine (NORVASC) 5 MG tablet, TAKE 1 TABLET BY MOUTH  DAILY, Disp: 90 tablet, Rfl: 3   amoxicillin-clavulanate (  AUGMENTIN) 875-125 MG tablet, Take 1 tablet by mouth 2 (two) times daily for 7 days., Disp: 14 tablet, Rfl: 0   aspirin EC 81 MG tablet, Take 81 mg by mouth at bedtime. , Disp: , Rfl:    B Complex Vitamins (B COMPLEX-B12) TABS, Take 1 tablet by mouth daily., Disp: , Rfl:    carvedilol (COREG) 25 MG tablet, Take 1.5 tablets (37.5 mg total) by mouth 2 (two) times daily., Disp: 270 tablet, Rfl: 3    Cholecalciferol (VITAMIN D) 50 MCG (2000 UT) tablet, Take 2,000 Units by mouth daily., Disp: , Rfl:    Chromium 1000 MCG TABS, Take 1,000 mcg by mouth daily. , Disp: , Rfl:    Cinnamon 500 MG capsule, Take 500 mg by mouth daily., Disp: , Rfl:    furosemide (LASIX) 40 MG tablet, Take 1 tablet (40 mg total) by mouth every morning. One tab twice daily for 1 week, Disp: 60 tablet, Rfl: 2   hydrALAZINE (APRESOLINE) 50 MG tablet, TAKE 1 TABLET BY MOUTH  TWICE DAILY, Disp: 180 tablet, Rfl: 3   Insulin Lispro Prot & Lispro (HUMALOG MIX 75/25 KWIKPEN) (75-25) 100 UNIT/ML Kwikpen, Inject 45 Units into the skin at bedtime., Disp: 45 mL, Rfl: 3   nystatin (NYSTATIN) powder, Apply topically 3 (three) times daily as needed. As needed, Disp: 60 g, Rfl: 2   Omega-3 Fatty Acids (FISH OIL) 500 MG CAPS, Take 500 mg by mouth daily., Disp: , Rfl:    omeprazole (PRILOSEC) 40 MG capsule, Take 40 mg by mouth daily., Disp: , Rfl:    pravastatin (PRAVACHOL) 40 MG tablet, TAKE 1 TABLET BY MOUTH  DAILY, Disp: 90 tablet, Rfl: 3   Probiotic Product (PROBIOTIC PO), Take by mouth., Disp: , Rfl:    ROCKLATAN 0.02-0.005 % SOLN, SMARTSIG:1 Drop(s) In Eye(s) Every Evening, Disp: , Rfl:    TRULICITY 1.5 OY/7.7AJ SOPN, INJECT 1.5 MG INTO THE SKIN ONCE A WEEK. (Patient taking differently: Inject 1.5 mg as directed every Wednesday.), Disp: 2 pen, Rfl: 2   valsartan-hydrochlorothiazide (DIOVAN-HCT) 160-25 MG tablet, TAKE 1 TABLET BY MOUTH  DAILY, Disp: 90 tablet, Rfl: 3   fluticasone (FLONASE) 50 MCG/ACT nasal spray, Place 1 spray into both nostrils daily. (Patient taking differently: Place 1 spray into both nostrils as needed for allergies.), Disp: 16 g, Rfl: 2   Allergies  Allergen Reactions   Other Other (See Comments)    NO BLOOD   . Jehovah witness      The patient states she uses post menopausal status for birth control. Last LMP was No LMP recorded. Patient has had a hysterectomy.. Negative for Dysmenorrhea. Negative for:  breast discharge, breast lump(s), breast pain and breast self exam. Associated symptoms include abnormal vaginal bleeding. Pertinent negatives include abnormal bleeding (hematology), anxiety, decreased libido, depression, difficulty falling sleep, dyspareunia, history of infertility, nocturia, sexual dysfunction, sleep disturbances, urinary incontinence, urinary urgency, vaginal discharge and vaginal itching. Diet regular.The patient states her exercise level is    . The patient's tobacco use is:  Social History   Tobacco Use  Smoking Status Never  Smokeless Tobacco Never  . She has been exposed to passive smoke. The patient's alcohol use is:  Social History   Substance and Sexual Activity  Alcohol Use No    Review of Systems  Constitutional: Negative.   HENT: Negative.    Eyes: Negative.  Negative for blurred vision.  Respiratory: Negative.  Negative for shortness of breath.   Cardiovascular: Negative.  Negative for  chest pain and palpitations.  Gastrointestinal: Negative.        Pos indigestion  Endocrine: Negative.   Genitourinary:  Positive for frequency.  Musculoskeletal: Negative.   Skin: Negative.   Allergic/Immunologic: Negative.   Neurological: Negative.   Hematological: Negative.   Psychiatric/Behavioral: Negative.      Today's Vitals   10/10/20 0913  BP: 116/78  Pulse: 73  Temp: 97.9 F (36.6 C)  TempSrc: Oral  Weight: 277 lb (125.6 kg)  Height: 5\' 3"  (1.6 m)  PainSc: 7   PainLoc: Back   Body mass index is 49.07 kg/m.  Wt Readings from Last 3 Encounters:  10/10/20 277 lb (125.6 kg)  09/26/20 278 lb 6.4 oz (126.3 kg)  09/25/20 277 lb 12.8 oz (126 kg)   BP Readings from Last 3 Encounters:  10/10/20 116/78  09/26/20 131/66  09/25/20 120/70   Objective:  Physical Exam Vitals and nursing note reviewed.  Constitutional:      Appearance: Normal appearance. She is obese.  HENT:     Head: Normocephalic and atraumatic.     Right Ear: Ear canal and  external ear normal. There is impacted cerumen.     Left Ear: Tympanic membrane, ear canal and external ear normal.     Nose:     Comments: Masked     Mouth/Throat:     Comments: Masked  Eyes:     Extraocular Movements: Extraocular movements intact.     Conjunctiva/sclera: Conjunctivae normal.     Pupils: Pupils are equal, round, and reactive to light.  Cardiovascular:     Rate and Rhythm: Normal rate and regular rhythm.     Pulses:          Dorsalis pedis pulses are 1+ on the right side and 0 on the left side.     Heart sounds: Normal heart sounds.  Pulmonary:     Effort: Pulmonary effort is normal.     Breath sounds: Normal breath sounds.  Chest:  Breasts:    Tanner Score is 5.     Right: Normal.     Left: Normal.  Abdominal:     General: Bowel sounds are normal.     Palpations: Abdomen is soft.     Comments: Obese, soft  Genitourinary:    Comments: deferred Musculoskeletal:        General: Normal range of motion.     Cervical back: Normal range of motion and neck supple.     Right lower leg: Edema present.     Left lower leg: Edema present.     Comments: LLE edema, this is chronic.  Feet:     Right foot:     Protective Sensation: 5 sites tested.  5 sites sensed.     Skin integrity: Dry skin present.     Toenail Condition: Right toenails are abnormally thick.     Left foot:     Protective Sensation: 5 sites tested.  5 sites sensed.     Skin integrity: Dry skin present.     Toenail Condition: Left toenails are abnormally thick.  Skin:    General: Skin is warm and dry.  Neurological:     General: No focal deficit present.     Mental Status: She is alert and oriented to person, place, and time.  Psychiatric:        Mood and Affect: Mood normal.        Behavior: Behavior normal.        Assessment And Plan:  1. Routine general medical examination at health care facility Comments: A full exam was performed. Importance of monthly self breast exams was discussed  with the patient. PATIENT IS ADVISED TO GET 30-45 MINUTES REGULAR EXERCISE NO LESS THAN FOUR TO FIVE DAYS PER WEEK - BOTH WEIGHTBEARING EXERCISES AND AEROBIC ARE RECOMMENDED.  PATIENT IS ADVISED TO FOLLOW A HEALTHY DIET WITH AT LEAST SIX FRUITS/VEGGIES PER DAY, DECREASE INTAKE OF RED MEAT, AND TO INCREASE FISH INTAKE TO TWO DAYS PER WEEK.  MEATS/FISH SHOULD NOT BE FRIED, BAKED OR BROILED IS PREFERABLE.  IT IS ALSO IMPORTANT TO CUT BACK ON YOUR SUGAR INTAKE. PLEASE AVOID ANYTHING WITH ADDED SUGAR, CORN SYRUP OR OTHER SWEETENERS. IF YOU MUST USE A SWEETENER, YOU CAN TRY STEVIA. IT IS ALSO IMPORTANT TO AVOID ARTIFICIALLY SWEETENERS AND DIET BEVERAGES. LASTLY, I SUGGEST WEARING SPF 50 SUNSCREEN ON EXPOSED PARTS AND ESPECIALLY WHEN IN THE DIRECT SUNLIGHT FOR AN EXTENDED PERIOD OF TIME.  PLEASE AVOID FAST FOOD RESTAURANTS AND INCREASE YOUR WATER INTAKE.   2. Type 2 diabetes mellitus with stage 3b chronic kidney disease, with long-term current use of insulin ([place]) Comments: Diabetic foot exam was performed. Chronic, I will check labs as listed below. I will refer her to Nutrition as requested.  I DISCUSSED WITH THE PATIENT AT LENGTH REGARDING THE GOALS OF GLYCEMIC CONTROL AND POSSIBLE LONG-TERM COMPLICATIONS.  I  ALSO STRESSED THE IMPORTANCE OF COMPLIANCE WITH HOME GLUCOSE MONITORING, DIETARY RESTRICTIONS INCLUDING AVOIDANCE OF SUGARY DRINKS/PROCESSED FOODS,  ALONG WITH REGULAR EXERCISE.  I  ALSO STRESSED THE IMPORTANCE OF ANNUAL EYE EXAMS, SELF FOOT CARE AND COMPLIANCE WITH OFFICE VISITS.  - POCT UA - Microalbumin - Hemoglobin A1c - Liver Profile - Referral to Nutrition and Diabetes Services  3. Hypertensive heart and renal disease with renal failure, stage 1 through stage 4 or unspecified chronic kidney disease, with heart failure ([place]) Comments: Chronic, well controlled. Importance of dietary compliance was d/w pt. Advised to follow low sodium diet. EKG performed, pacemaker ECG. I will check urine studies  today.  - EKG 12-Lead - Referral to Nutrition and Diabetes Services  4. Recurrent UTI Comments: She is not yet followed by Urology. She is scheduled for renal u/s later today. Bathroom hygiene was also discussed with the patient.   5. Right ear impacted cerumen AFTER OBTAINING VERBAL CONSENT, RIGHT EAR WAS FLUSHED BY IRRIGATION. SHE TOLERATED PROCEDURE WELL WITHOUT ANY COMPLICATIONS. NO TM ABNORMALITIES WERE NOTED.  - Ear Lavage  6. Dyspepsia Comments: She was advised to decrease dose of Trulicity to 0.26 for two weeks.She is also advised to stop eating when full, and to stop eating 3 hrs prior to going to bed. She is encouraged to touch base in a week or two to let me know how she is feeling.   7. Nodule of left lung CT scan from 2021 reviewed in detail. Pt w/ 27mm left lower lung nodule. She has never smoked; therefore, she is low risk. NO need for f/u chest CT at this time.   8. Class 3 severe obesity due to excess calories with serious comorbidity and body mass index (BMI) of 45.0 to 49.9 in adult [facility]) Comments: BMI 49. She is encouraged to aim for at least 150 minutes of exercise per week. She is also encouraged to strive to lose 25 pounds to decrease cardiac risk.   Patient was given opportunity to ask questions. Patient verbalized understanding of the plan and was able to repeat key elements of the plan. All questions were  answered to their satisfaction.   I, Maximino Greenland, MD, have reviewed all documentation for this visit. The documentation on 10/10/20 for the exam, diagnosis, procedures, and orders are all accurate and complete.  THE PATIENT IS ENCOURAGED TO PRACTICE SOCIAL DISTANCING DUE TO THE COVID-19 PANDEMIC.

## 2020-10-11 LAB — HEPATIC FUNCTION PANEL
ALT: 15 IU/L (ref 0–32)
AST: 19 IU/L (ref 0–40)
Albumin: 4.5 g/dL (ref 3.7–4.7)
Alkaline Phosphatase: 105 IU/L (ref 44–121)
Bilirubin Total: 0.2 mg/dL (ref 0.0–1.2)
Bilirubin, Direct: <0.1 mg/dL (ref 0.00–0.40)
Total Protein: 7.6 g/dL (ref 6.0–8.5)

## 2020-10-11 LAB — HEMOGLOBIN A1C
Est. average glucose Bld gHb Est-mCnc: 183 mg/dL
Hgb A1c MFr Bld: 8 % — ABNORMAL HIGH (ref 4.8–5.6)

## 2020-10-12 DIAGNOSIS — I129 Hypertensive chronic kidney disease with stage 1 through stage 4 chronic kidney disease, or unspecified chronic kidney disease: Secondary | ICD-10-CM | POA: Diagnosis not present

## 2020-10-12 DIAGNOSIS — R197 Diarrhea, unspecified: Secondary | ICD-10-CM | POA: Diagnosis not present

## 2020-10-12 DIAGNOSIS — N1832 Chronic kidney disease, stage 3b: Secondary | ICD-10-CM | POA: Diagnosis not present

## 2020-10-12 DIAGNOSIS — I509 Heart failure, unspecified: Secondary | ICD-10-CM | POA: Diagnosis not present

## 2020-10-12 DIAGNOSIS — N390 Urinary tract infection, site not specified: Secondary | ICD-10-CM | POA: Diagnosis not present

## 2020-10-12 DIAGNOSIS — E1122 Type 2 diabetes mellitus with diabetic chronic kidney disease: Secondary | ICD-10-CM | POA: Diagnosis not present

## 2020-10-15 ENCOUNTER — Telehealth: Payer: Medicare Other

## 2020-10-18 ENCOUNTER — Other Ambulatory Visit: Payer: Medicare Other

## 2020-10-18 DIAGNOSIS — I5022 Chronic systolic (congestive) heart failure: Secondary | ICD-10-CM | POA: Diagnosis not present

## 2020-10-18 DIAGNOSIS — Z4502 Encounter for adjustment and management of automatic implantable cardiac defibrillator: Secondary | ICD-10-CM | POA: Diagnosis not present

## 2020-10-18 DIAGNOSIS — Z95810 Presence of automatic (implantable) cardiac defibrillator: Secondary | ICD-10-CM | POA: Diagnosis not present

## 2020-10-30 ENCOUNTER — Other Ambulatory Visit: Payer: Self-pay

## 2020-10-30 ENCOUNTER — Ambulatory Visit: Payer: Medicare Other

## 2020-10-30 DIAGNOSIS — I5042 Chronic combined systolic (congestive) and diastolic (congestive) heart failure: Secondary | ICD-10-CM

## 2020-10-30 DIAGNOSIS — I428 Other cardiomyopathies: Secondary | ICD-10-CM | POA: Diagnosis not present

## 2020-10-30 DIAGNOSIS — Z87440 Personal history of urinary (tract) infections: Secondary | ICD-10-CM | POA: Diagnosis not present

## 2020-10-30 NOTE — Patient Instructions (Signed)
Visit Information It was great speaking with you today!  Please let me know if you have any questions about our visit.   Goals Addressed             This Visit's Progress    Monitor and Manage My Blood Sugar-Diabetes Type 2       Timeframe:  Long-Range Goal Priority:  High Start Date:                             Expected End Date:                       Follow Up Date 01/08/2021   - check blood sugar at prescribed times - take the blood sugar log to all doctor visits - take the blood sugar meter to all doctor visits    Why is this important?   Checking your blood sugar at home helps to keep it from getting very high or very low.  Writing the results in a diary or log helps the doctor know how to care for you.  Your blood sugar log should have the time, date and the results.  Also, write down the amount of insulin or other medicine that you take.  Other information, like what you ate, exercise done and how you were feeling, will also be helpful.     Notes:          Patient Care Plan: CCM Pharmacy Care Plan     Problem Identified: HTN, DM II, HLD   Priority: High     Long-Range Goal: Disease Management   Start Date: 07/12/2020  Recent Progress: On track  Priority: High  Note:     Current Barriers:  Unable to independently monitor therapeutic efficacy   Pharmacist Clinical Goal(s):  Over the next 90 days, patient will verbalize ability to afford treatment regimen through collaboration with PharmD and provider.   Interventions: 1:1 collaboration with [name], MD regarding development and update of comprehensive plan of care as evidenced by provider attestation and co-signature Inter-disciplinary care team collaboration (see longitudinal plan of care) Comprehensive medication review performed; medication list updated in electronic medical record   Hypertension (BP goal <130/80) -Controlled -Current treatment: Amlodipine 5 mg tablet daily Hydralazine  50 mg tablet twice daily  Valsartan-HCTZ 160-25mg  take 1 tablet by mouth daily  Carvedilol 25 mg -take 1.5 tablets twice daily.   -Current home readings: she is checking her BP at home 120-140/81 over two weeks. Yesterday her BP was 121/78  -Current dietary habits: not eating as much, she does not use salt to season her food -Denies hypotensive/hypertensive symptoms -Educated on BP goals and benefits of medications for prevention of heart attack, stroke and kidney damage; Daily salt intake goal < 2300 mg; Importance of home blood pressure monitoring; -Counseled to monitor BP at home at least one time per day, document, and provide log at future appointments -Recommended to continue current medication  Hyperlipidemia: (LDL goal < 70) -Controlled -Current treatment: Aspirin 81 mg EC- take one tablet daily. Pravastatin 40 mg take daily.  Patient is on automatic refill and she has over a month left.  -Current dietary patterns: patient reports that she is going to start keeping a food log -Current exercise habits: please see diabetes for detailed information on exercise.  -Congratulated patient on LDL <70  Importance of limiting foods high in cholesterol; Exercise goal of 150 minutes per week; -  Counseled on diet and exercise extensively Recommended to continue current medication  Diabetes (A1c goal <7%) -Uncontrolled -Current medications: Humalog Mix 75/25 inject 45 units in to the skin at bedtime Trulicity 1.5 mg /9.1 ML - Inject 0.75 mg into the skin once a week.  -Current home glucose readings: checking BS 3-4 times a day during the past week fasting glucose:  125, 175,179 post prandial glucose: <180 -Denies hypoglycemic/hyperglycemic symptoms -Current meal patterns:  breakfast: 2 boiled eggs  lunch: burger with lettuce, tomato and onions dinner: less than a 1/3 of a steak and cup of macaroni and cheese drinks: drinking some water about 3 bottles of water  -Current exercise:  has increased her steps to 2000 steps or more, she is going to increase her steps.  She walks around the house, the store or walking back and forth from the [place]. She is using an app to make sure that she is exercising and she also places her goals in the app.  -Patient reports that she has been focused using her smart watch to work out and rest.  -Patient reports that [facility] will call her and remind her to fill prescription.  -Educated on A1c and blood sugar goals; Prevention and management of hypoglycemic episodes; Benefits of routine self-monitoring of blood sugar; Carbohydrate counting and/or plate method  -Counseled to check feet daily and get yearly eye exams --Discussed proper foot examination technique -Recommended to continue current medication    Patient Goals/Self-Care Activities Patient will:  - take medications as prescribed target a minimum of 150 minutes of moderate intensity exercise weekly  Follow Up Plan:   Face to Face appointment with care management team member scheduled for:  01/08/2021 The patient has been provided with contact information for the care management team and has been advised to call with any health related questions or concerns.        Patient agreed to services and verbal consent obtained.   The patient verbalized understanding of instructions, educational materials, and care plan provided today and agreed to receive a mailed copy of patient instructions, educational materials, and care plan.   [name], PharmD Clinical Pharmacist [facility] [phone]

## 2020-11-01 DIAGNOSIS — N1832 Chronic kidney disease, stage 3b: Secondary | ICD-10-CM | POA: Diagnosis not present

## 2020-11-01 DIAGNOSIS — E785 Hyperlipidemia, unspecified: Secondary | ICD-10-CM | POA: Diagnosis not present

## 2020-11-01 DIAGNOSIS — Z794 Long term (current) use of insulin: Secondary | ICD-10-CM

## 2020-11-01 DIAGNOSIS — E1122 Type 2 diabetes mellitus with diabetic chronic kidney disease: Secondary | ICD-10-CM | POA: Diagnosis not present

## 2020-11-02 ENCOUNTER — Other Ambulatory Visit: Payer: Self-pay | Admitting: Cardiology

## 2020-11-02 DIAGNOSIS — I10 Essential (primary) hypertension: Secondary | ICD-10-CM

## 2020-11-06 NOTE — Progress Notes (Signed)
Echocardiogram 10/30/2020: Left ventricle cavity is normal in size and wall thickness. Normal global wall motion. Normal LV systolic function with visual EF 50-55%. Doppler evidence of grade I (impaired) diastolic dysfunction, normal LAP. Left atrial cavity is mildly dilated. Trileaflet aortic valve.  Trace aortic stenosis. Trace aortic regurgitation. Mild (Grade I) mitral regurgitation. Mild to moderate tricuspid regurgitation. Estimated pulmonary artery systolic pressure 31 mmHg. No significant change compared to previous study in 2016.

## 2020-11-07 ENCOUNTER — Encounter: Payer: Self-pay | Admitting: Internal Medicine

## 2020-11-14 ENCOUNTER — Other Ambulatory Visit: Payer: Self-pay

## 2020-11-14 ENCOUNTER — Emergency Department (HOSPITAL_BASED_OUTPATIENT_CLINIC_OR_DEPARTMENT_OTHER): Payer: Medicare Other

## 2020-11-14 ENCOUNTER — Other Ambulatory Visit: Payer: Self-pay | Admitting: Cardiology

## 2020-11-14 ENCOUNTER — Other Ambulatory Visit: Payer: Self-pay | Admitting: Internal Medicine

## 2020-11-14 ENCOUNTER — Emergency Department (HOSPITAL_BASED_OUTPATIENT_CLINIC_OR_DEPARTMENT_OTHER)
Admission: EM | Admit: 2020-11-14 | Discharge: 2020-11-14 | Disposition: A | Discharge status: Home / Self Care | Payer: Medicare Other | Attending: Emergency Medicine | Admitting: Emergency Medicine

## 2020-11-14 ENCOUNTER — Encounter (HOSPITAL_BASED_OUTPATIENT_CLINIC_OR_DEPARTMENT_OTHER): Payer: Self-pay

## 2020-11-14 DIAGNOSIS — J45909 Unspecified asthma, uncomplicated: Secondary | ICD-10-CM | POA: Diagnosis not present

## 2020-11-14 DIAGNOSIS — Z96652 Presence of left artificial knee joint: Secondary | ICD-10-CM | POA: Insufficient documentation

## 2020-11-14 DIAGNOSIS — N189 Chronic kidney disease, unspecified: Secondary | ICD-10-CM | POA: Diagnosis not present

## 2020-11-14 DIAGNOSIS — S199XXA Unspecified injury of neck, initial encounter: Secondary | ICD-10-CM | POA: Diagnosis not present

## 2020-11-14 DIAGNOSIS — Z981 Arthrodesis status: Secondary | ICD-10-CM | POA: Diagnosis not present

## 2020-11-14 DIAGNOSIS — M542 Cervicalgia: Secondary | ICD-10-CM | POA: Diagnosis not present

## 2020-11-14 DIAGNOSIS — M436 Torticollis: Secondary | ICD-10-CM | POA: Diagnosis not present

## 2020-11-14 DIAGNOSIS — R519 Headache, unspecified: Secondary | ICD-10-CM | POA: Diagnosis not present

## 2020-11-14 DIAGNOSIS — Z79899 Other long term (current) drug therapy: Secondary | ICD-10-CM | POA: Diagnosis not present

## 2020-11-14 DIAGNOSIS — H53149 Visual discomfort, unspecified: Secondary | ICD-10-CM | POA: Diagnosis not present

## 2020-11-14 DIAGNOSIS — I129 Hypertensive chronic kidney disease with stage 1 through stage 4 chronic kidney disease, or unspecified chronic kidney disease: Secondary | ICD-10-CM | POA: Insufficient documentation

## 2020-11-14 DIAGNOSIS — Z794 Long term (current) use of insulin: Secondary | ICD-10-CM | POA: Diagnosis not present

## 2020-11-14 DIAGNOSIS — Z7982 Long term (current) use of aspirin: Secondary | ICD-10-CM | POA: Insufficient documentation

## 2020-11-14 DIAGNOSIS — E1122 Type 2 diabetes mellitus with diabetic chronic kidney disease: Secondary | ICD-10-CM | POA: Insufficient documentation

## 2020-11-14 DIAGNOSIS — J3489 Other specified disorders of nose and nasal sinuses: Secondary | ICD-10-CM | POA: Diagnosis not present

## 2020-11-14 DIAGNOSIS — J019 Acute sinusitis, unspecified: Secondary | ICD-10-CM | POA: Diagnosis not present

## 2020-11-14 DIAGNOSIS — S0990XA Unspecified injury of head, initial encounter: Secondary | ICD-10-CM | POA: Diagnosis not present

## 2020-11-14 LAB — BASIC METABOLIC PANEL
Anion gap: 8 (ref 5–15)
BUN: 25 mg/dL — ABNORMAL HIGH (ref 8–23)
CO2: 27 mmol/L (ref 22–32)
Calcium: 8.9 mg/dL (ref 8.9–10.3)
Chloride: 100 mmol/L (ref 98–111)
Creatinine, Ser: 1.6 mg/dL — ABNORMAL HIGH (ref 0.44–1.00)
GFR, Estimated: 34 mL/min — ABNORMAL LOW (ref >60)
Glucose, Bld: 116 mg/dL — ABNORMAL HIGH (ref 70–99)
Potassium: 4.1 mmol/L (ref 3.5–5.1)
Sodium: 135 mmol/L (ref 135–145)

## 2020-11-14 LAB — CBC WITH DIFFERENTIAL/PLATELET
Abs Immature Granulocytes: 0.04 10*3/uL (ref 0.00–0.07)
Basophils Absolute: 0 10*3/uL (ref 0.0–0.1)
Basophils Relative: 0 %
Eosinophils Absolute: 0.7 10*3/uL — ABNORMAL HIGH (ref 0.0–0.5)
Eosinophils Relative: 7 %
HCT: 37.7 % (ref 36.0–46.0)
Hemoglobin: 12 g/dL (ref 12.0–15.0)
Immature Granulocytes: 0 %
Lymphocytes Relative: 28 %
Lymphs Abs: 2.8 10*3/uL (ref 0.7–4.0)
MCH: 26 pg (ref 26.0–34.0)
MCHC: 31.8 g/dL (ref 30.0–36.0)
MCV: 81.8 fL (ref 80.0–100.0)
Monocytes Absolute: 0.5 10*3/uL (ref 0.1–1.0)
Monocytes Relative: 5 %
Neutro Abs: 6 10*3/uL (ref 1.7–7.7)
Neutrophils Relative %: 60 %
Platelets: 300 10*3/uL (ref 150–400)
RBC: 4.61 MIL/uL (ref 3.87–5.11)
RDW: 15.3 % (ref 11.5–15.5)
WBC: 10.1 10*3/uL (ref 4.0–10.5)
nRBC: 0 % (ref 0.0–0.2)

## 2020-11-14 LAB — SEDIMENTATION RATE: Sed Rate: 31 mm/hr — ABNORMAL HIGH (ref 0–22)

## 2020-11-14 MED ORDER — METHOCARBAMOL 500 MG PO TABS
500.0000 mg | ORAL_TABLET | Freq: Once | ORAL | Status: AC
  Administered 2020-11-14: 500 mg via ORAL
  Filled 2020-11-14: qty 1

## 2020-11-14 MED ORDER — METHOCARBAMOL 500 MG PO TABS: 500.0000 mg | ORAL_TABLET | Freq: Two times a day (BID) | ORAL | 0 refills | Status: DC

## 2020-11-14 MED ORDER — KETOROLAC TROMETHAMINE 30 MG/ML IJ SOLN
15.0000 mg | Freq: Once | INTRAMUSCULAR | Status: AC
  Administered 2020-11-14: 15 mg via INTRAVENOUS
  Filled 2020-11-14: qty 1

## 2020-11-14 NOTE — ED Triage Notes (Signed)
Pt reports left sided headache X2 days

## 2020-11-14 NOTE — ED Provider Notes (Signed)
[facility] EMERGENCY DEPARTMENT Provider Note  CSN: [id number] Arrival date & time: 11/14/20 0920    History Chief Complaint  Patient presents with   Headache    Yvonne Miller is a 70 y.o. female with historty of GERD, CKD, AICD, HTN, DM not on blood thinners presents for evaluation of left sided headache. Started as an aching, 'crick' in her left neck several days ago, progressed to L sided headache, severe, aching, associated with some blurry vision on that side. She reports neck pain is worse with movement. She had a fall two weeks ago, had a 'bump' on her left scalp but did not seek medical care and didn't think much of it. She has been taking APAP at home with minimal improvement. Does not regularly have headaches.    Past Medical History:  Diagnosis Date   AICD (automatic cardioverter/defibrillator) present    Arthritis    Asthma    Carpal tunnel syndrome, bilateral    CKD (chronic kidney disease)    Diabetes mellitus    Encounter for assessment of implantable cardioverter-defibrillator (ICD)    GERD (gastroesophageal reflux disease)    Hyperlipemia    Hypertension    Obesity    morbid   Sleep apnea    wears CPAP set at 12   Wears glasses     Past Surgical History:  Procedure Laterality Date   ABDOMINAL HYSTERECTOMY     BIOPSY  03/01/2019   Procedure: BIOPSY;  Surgeon: Juanita Craver, MD;  Location: [facility];  Service: Endoscopy;;   BREAST SURGERY  1968   breast mass excision   CARPAL TUNNEL RELEASE Left 07/10/2017   Procedure: CARPAL TUNNEL RELEASE;  Surgeon: Ashok Pall, MD;  Location: [place];  Service: Neurosurgery;  Laterality: Left;  left   CARPAL TUNNEL RELEASE Right 03/19/2018   Procedure: RIGHT CARPAL TUNNEL RELEASE;  Surgeon: Ashok Pall, MD;  Location: [place];  Service: Neurosurgery;  Laterality: Right;  right   CERVICAL SPINE SURGERY  2006   CHOLECYSTECTOMY     COLONOSCOPY WITH PROPOFOL N/A 03/01/2019   Procedure: COLONOSCOPY WITH  PROPOFOL;  Surgeon: Juanita Craver, MD;  Location: [facility];  Service: Endoscopy;  Laterality: N/A;   DILATION AND CURETTAGE OF UTERUS     ICD IMPLANT     JOINT REPLACEMENT     TONSILLECTOMY  1961   TOTAL KNEE ARTHROPLASTY Left 01/26/2018   Procedure: LEFT TOTAL KNEE ARTHROPLASTY;  Surgeon: Mcarthur Rossetti, MD;  Location: [place];  Service: Orthopedics;  Laterality: Left;    Family History  Problem Relation Age of Onset   Hypertension Mother    Stroke Mother    Heart disease Father    Heart attack Sister    Heart attack Brother     Social History   Tobacco Use   Smoking status: Never   Smokeless tobacco: Never  Vaping Use   Vaping Use: Never used  Substance Use Topics   Alcohol use: No   Drug use: No     Home Medications Prior to Admission medications   Medication Sig Start Date End Date Taking? Authorizing Provider  methocarbamol (ROBAXIN) 500 MG tablet Take 1 tablet (500 mg total) by mouth 2 (two) times daily. 11/14/20  Yes Truddie Hidden, MD  ACCU-CHEK AVIVA PLUS test strip CHECK 4 TIMES BY INTRADERMAL ROUTE EVERY DAY 05/16/20   [name], MD  albuterol (PROVENTIL HFA;VENTOLIN HFA) 108 (90 Base) MCG/ACT inhaler Inhale 2 puffs into the lungs every 4 (four)  hours as needed for wheezing or shortness of breath.     [provider]  amLODipine (NORVASC) 5 MG tablet TAKE 1 TABLET BY MOUTH  DAILY 11/15/20   Adrian Prows, MD  aspirin EC 81 MG tablet Take 81 mg by mouth at bedtime.     [provider]  B Complex Vitamins (B COMPLEX-B12) TABS Take 1 tablet by mouth daily.    [provider]  carvedilol (COREG) 25 MG tablet Take 1.5 tablets (37.5 mg total) by mouth 2 (two) times daily. 10/08/20   Adrian Prows, MD  Cholecalciferol (VITAMIN D) 50 MCG (2000 UT) tablet Take 2,000 Units by mouth daily.    [provider]  Chromium 1000 MCG TABS Take 1,000 mcg by mouth daily.     [provider]  Cinnamon 500 MG capsule Take 500 mg by  mouth daily.    [provider]  fluticasone (FLONASE) 50 MCG/ACT nasal spray Place 1 spray into both nostrils daily. Patient taking differently: Place 1 spray into both nostrils as needed for allergies. 04/14/18 02/07/20  [name], MD  furosemide (LASIX) 40 MG tablet Take 1 tablet (40 mg total) by mouth every morning. One tab twice daily for 1 week 09/01/20   Adrian Prows, MD  hydrALAZINE (APRESOLINE) 50 MG tablet TAKE 1 TABLET BY MOUTH  TWICE DAILY 08/29/20   Adrian Prows, MD  Insulin Lispro Prot & Lispro (HUMALOG MIX 75/25 KWIKPEN) (75-25) 100 UNIT/ML Kwikpen Inject 45 Units into the skin at bedtime. 07/11/20   [name], MD  nystatin (NYSTATIN) powder Apply topically 3 (three) times daily as needed. As needed 06/21/20   [name], MD  Omega-3 Fatty Acids (FISH OIL) 500 MG CAPS Take 500 mg by mouth daily.    [provider]  omeprazole (PRILOSEC) 40 MG capsule Take 40 mg by mouth daily.    [provider]  pravastatin (PRAVACHOL) 40 MG tablet TAKE 1 TABLET BY MOUTH  DAILY 11/15/20   [name], MD  Probiotic Product (PROBIOTIC PO) Take by mouth.    [provider]  ROCKLATAN 0.02-0.005 % SOLN SMARTSIG:1 Drop(s) In Eye(s) Every Evening 12/06/19   [provider]  spironolactone (ALDACTONE) 25 MG tablet TAKE 1 TABLET BY MOUTH IN  THE MORNING 11/06/20   Adrian Prows, MD  TRULICITY 1.5 PZ/0.2HE SOPN INJECT 1.5 MG INTO THE SKIN ONCE A WEEK. Patient taking differently: Inject 1.5 mg as directed every Wednesday. 09/28/18   [name], MD  valsartan-hydrochlorothiazide (DIOVAN-HCT) 160-25 MG tablet TAKE 1 TABLET BY MOUTH  DAILY 11/15/20   [name], MD     Allergies    Other   Review of Systems   Review of Systems A comprehensive review of systems was completed and negative except as noted in HPI.    Physical Exam BP 124/64 (BP Location: Right Arm)   Pulse 65   Temp 97.6 F (36.4 C) (Oral)   Resp 16   Ht 5\' 4"  (1.626 m)   Wt 124.7  kg   SpO2 100%   BMI 47.20 kg/m   Physical Exam Vitals and nursing note reviewed.  Constitutional:      Appearance: Normal appearance.  HENT:     Head: Normocephalic and atraumatic.     Nose: Nose normal.     Mouth/Throat:     Mouth: Mucous membranes are moist.  Eyes:     Extraocular Movements: Extraocular movements intact.     Conjunctiva/sclera: Conjunctivae normal.  Neck:  Comments: Tenderness L cervical paraspinal muscles and base of skull Cardiovascular:     Rate and Rhythm: Normal rate.  Pulmonary:     Effort: Pulmonary effort is normal.     Breath sounds: Normal breath sounds.  Abdominal:     General: Abdomen is flat.     Palpations: Abdomen is soft.     Tenderness: There is no abdominal tenderness.  Musculoskeletal:        General: No swelling. Normal range of motion.     Cervical back: Neck supple.  Skin:    General: Skin is warm and dry.  Neurological:     General: No focal deficit present.     Mental Status: She is alert and oriented to person, place, and time.     Cranial Nerves: No cranial nerve deficit.     Sensory: No sensory deficit.     Motor: No weakness.     Comments: No tenderness over the L temple  Psychiatric:        Mood and Affect: Mood normal.     ED Results / Procedures / Treatments   Labs (all labs ordered are listed, but only abnormal results are displayed) Labs Reviewed  BASIC METABOLIC PANEL - Abnormal; Notable for the following components:      Result Value   Glucose, Bld 116 (*)    BUN 25 (*)    Creatinine, Ser 1.60 (*)    GFR, Estimated 34 (*)    All other components within normal limits  CBC WITH DIFFERENTIAL/PLATELET - Abnormal; Notable for the following components:   Eosinophils Absolute 0.7 (*)    All other components within normal limits  SEDIMENTATION RATE - Abnormal; Notable for the following components:   Sed Rate 31 (*)    All other components within normal limits    EKG None   Radiology CT Head Wo  Contrast  Result Date: 11/14/2020 CLINICAL DATA:  Fall 2 weeks ago, hit left side of head. EXAM: CT HEAD WITHOUT CONTRAST TECHNIQUE: Contiguous axial images were obtained from the base of the skull through the vertex without intravenous contrast. COMPARISON:  None. FINDINGS: Brain: No acute intracranial abnormality. Specifically, no hemorrhage, hydrocephalus, mass lesion, acute infarction, or significant intracranial injury. Vascular: No hyperdense vessel or unexpected calcification. Skull: No acute calvarial abnormality. Sinuses/Orbits: Mucosal thickening throughout the paranasal sinuses. Air-fluid levels in the maxillary sinuses and right sphenoid sinus. Other: None IMPRESSION: No acute intracranial abnormality. Acute on chronic sinusitis. Electronically Signed   By: Rolm Baptise M.D.   On: 11/14/2020 10:20   CT Cervical Spine Wo Contrast  Result Date: 11/14/2020 CLINICAL DATA:  Fall, hit left side of head EXAM: CT CERVICAL SPINE WITHOUT CONTRAST TECHNIQUE: Multidetector CT imaging of the cervical spine was performed without intravenous contrast. Multiplanar CT image reconstructions were also generated. COMPARISON:  None. FINDINGS: Alignment: Normal Skull base and vertebrae: No acute fracture. No primary bone lesion or focal pathologic process. Soft tissues and spinal canal: No prevertebral fluid or swelling. No visible canal hematoma. Disc levels: Prior anterior fusion from C4-C6. No hardware complicating feature. Diffuse degenerative disc disease and facet disease. Upper chest: No acute findings Other: None IMPRESSION: No acute bony abnormality. Postoperative and degenerative changes. Electronically Signed   By: Rolm Baptise M.D.   On: 11/14/2020 10:26    Procedures Procedures  Medications Ordered in the ED Medications  methocarbamol (ROBAXIN) tablet 500 mg (500 mg Oral Given 11/14/20 1014)  ketorolac (TORADOL) 30 MG/ML injection 15 mg (15 mg  Intravenous Given 11/14/20 1202)     MDM  Rules/Calculators/A&P MDM Patient with headache and neck pain, recent fall may or may not be related. She is having some muscle spasm in left neck which may be the underlying cause, but given age and medical problems will check labs, CT and reassess. Robaxin for muscle spasm.   ED Course  I have reviewed the triage vital signs and the nursing notes.  Pertinent labs & imaging results that were available during my care of the patient were reviewed by me and considered in my medical decision making (see chart for details).  Clinical Course as of 11/15/20 1309  Wed Nov 14, 2020  1033 CT head and C spine neg for injury. [CS]  1046 CBC is normal.  [CS]  3403 BMP with CKD at baseline, otherwise unremarkable.  [CS]  7096 Sed rate not significant elevated to suggest temporal arteritis. She reports her neck pain is improved with Robaxin but still having headache. I would avoid NSAIDs long term given her CKD but a single dose of toradol in the ED should not cause any long term issues.  [CS]  1252 Patient reports headache is resolved. She is ready to go home. Rx for Robaxin, continue APAP. PCP follow as needed.  [CS]    Clinical Course User Index [CS] Truddie Hidden, MD    Final Clinical Impression(s) / ED Diagnoses Final diagnoses:  Acute nonintractable headache, unspecified headache type  Cervicalgia    Rx / DC Orders ED Discharge Orders          Ordered    methocarbamol (ROBAXIN) 500 MG tablet  2 times daily        11/14/20 1257             Truddie Hidden, MD 11/15/20 1309

## 2020-11-14 NOTE — ED Notes (Signed)
Reports her neck pain is much better now just c/o headache.

## 2020-11-15 ENCOUNTER — Telehealth: Payer: Self-pay

## 2020-11-15 NOTE — Telephone Encounter (Signed)
Transition Care Management Follow-up Telephone Call Date of discharge and from where: 11/14/2020 [facility]  How have you been since you were released from the hospital?  Any questions or concerns? Yes  Items Reviewed: Did the pt receive and understand the discharge instructions provided? Yes  Medications obtained and verified? Yes  Other? Yes  Any new allergies since your discharge? No  Dietary orders reviewed? Yes Do you have support at home? No   Home Care and Equipment/Supplies: Were home health services ordered? not applicable If so, what is the name of the agency? N/a   Has the agency set up a time to come to the patient's home? not applicable Were any new equipment or medical supplies ordered?  No What is the name of the medical supply agency? N/a  Were you able to get the supplies/equipment? not applicable Do you have any questions related to the use of the equipment or supplies? No  Functional Questionnaire: (I = Independent and D = Dependent) ADLs: I  Bathing/Dressing- I  Meal Prep- I  Eating- I  Maintaining continence- I  Transferring/Ambulation- I  Managing Meds- I  Follow up appointments reviewed:  PCP Hospital f/u appt confirmed? Yes  Scheduled to see Raman Ghumman on 11/22/2020 @ 3:30pm [facility]. [facility] f/u appt confirmed? No  Scheduled to see n/a  on n/a  @ n/a . Are transportation arrangements needed? No  If their condition worsens, is the pt aware to call PCP or go to the Emergency Dept.? Yes Was the patient provided with contact information for the PCP's office or ED? Yes Was to pt encouraged to call back with questions or concerns? Yes

## 2020-11-22 ENCOUNTER — Encounter: Payer: Self-pay | Admitting: Skilled Nursing Facility1

## 2020-11-22 ENCOUNTER — Ambulatory Visit (INDEPENDENT_AMBULATORY_CARE_PROVIDER_SITE_OTHER): Payer: Medicare Other | Admitting: Nurse Practitioner

## 2020-11-22 ENCOUNTER — Telehealth: Payer: Self-pay

## 2020-11-22 ENCOUNTER — Other Ambulatory Visit: Payer: Self-pay

## 2020-11-22 ENCOUNTER — Encounter: Payer: Medicare Other | Attending: Internal Medicine | Admitting: Skilled Nursing Facility1

## 2020-11-22 ENCOUNTER — Encounter: Payer: Self-pay | Admitting: Nurse Practitioner

## 2020-11-22 VITALS — BP 124/76 | HR 69 | Temp 97.9°F | Ht 64.0 in | Wt 278.0 lb

## 2020-11-22 DIAGNOSIS — Z09 Encounter for follow-up examination after completed treatment for conditions other than malignant neoplasm: Secondary | ICD-10-CM | POA: Diagnosis not present

## 2020-11-22 DIAGNOSIS — R350 Frequency of micturition: Secondary | ICD-10-CM

## 2020-11-22 DIAGNOSIS — E119 Type 2 diabetes mellitus without complications: Secondary | ICD-10-CM | POA: Diagnosis not present

## 2020-11-22 DIAGNOSIS — E1122 Type 2 diabetes mellitus with diabetic chronic kidney disease: Secondary | ICD-10-CM | POA: Diagnosis not present

## 2020-11-22 DIAGNOSIS — N1832 Chronic kidney disease, stage 3b: Secondary | ICD-10-CM | POA: Diagnosis not present

## 2020-11-22 DIAGNOSIS — R82998 Other abnormal findings in urine: Secondary | ICD-10-CM | POA: Diagnosis not present

## 2020-11-22 DIAGNOSIS — N182 Chronic kidney disease, stage 2 (mild): Secondary | ICD-10-CM | POA: Insufficient documentation

## 2020-11-22 DIAGNOSIS — Z6841 Body Mass Index (BMI) 40.0 and over, adult: Secondary | ICD-10-CM | POA: Insufficient documentation

## 2020-11-22 LAB — POCT URINALYSIS DIPSTICK
Bilirubin, UA: NEGATIVE
Glucose, UA: NEGATIVE
Ketones, UA: POSITIVE
Nitrite, UA: NEGATIVE
Protein, UA: POSITIVE — AB
Spec Grav, UA: 1.02 (ref 1.010–1.025)
Urobilinogen, UA: 1 E.U./dL
pH, UA: 5 (ref 5.0–8.0)

## 2020-11-22 NOTE — Chronic Care Management (AMB) (Signed)
Chronic Care Management Pharmacy Assistant   Name: Yvonne Miller  MRN: [id number] DOB: [birth year]   Reason for Encounter: Disease State/ Diabetes    Recent office visits:  10-10-2020 [name], MD. EKG completed.  Recent consult visits:  10-30-2020 [place]a, Nancy Marus, NP (Urology). No changes  Hospital visits:  Medication Reconciliation was completed by comparing discharge summary, patient's EMR and Pharmacy list, and upon discussion with patient.  Admitted to the hospital on 11-14-2020 due to acute nonintractable headache. Discharge date was 11-14-2020. Discharged from [facility]?Medications Started at [facility] Discharge:?? Methocarbamol 500 mg twice daily  Medication Changes at Hospital Discharge: None  Medications Discontinued at Hospital Discharge: None  Medications that remain the same after Hospital Discharge:??  -All other medications will remain the same.    Medications: Outpatient Encounter Medications as of 11/22/2020  Medication Sig   ACCU-CHEK AVIVA PLUS test strip CHECK 4 TIMES BY INTRADERMAL ROUTE EVERY DAY   albuterol (PROVENTIL HFA;VENTOLIN HFA) 108 (90 Base) MCG/ACT inhaler Inhale 2 puffs into the lungs every 4 (four) hours as needed for wheezing or shortness of breath.    amLODipine (NORVASC) 5 MG tablet TAKE 1 TABLET BY MOUTH  DAILY   aspirin EC 81 MG tablet Take 81 mg by mouth at bedtime.    B Complex Vitamins (B COMPLEX-B12) TABS Take 1 tablet by mouth daily.   carvedilol (COREG) 25 MG tablet Take 1.5 tablets (37.5 mg total) by mouth 2 (two) times daily.   Cholecalciferol (VITAMIN D) 50 MCG (2000 UT) tablet Take 2,000 Units by mouth daily.   Chromium 1000 MCG TABS Take 1,000 mcg by mouth daily.    Cinnamon 500 MG capsule Take 500 mg by mouth daily.   fluticasone (FLONASE) 50 MCG/ACT nasal spray Place 1 spray into both nostrils daily. (Patient taking differently: Place 1 spray into both nostrils as needed for  allergies.)   furosemide (LASIX) 40 MG tablet Take 1 tablet (40 mg total) by mouth every morning. One tab twice daily for 1 week   hydrALAZINE (APRESOLINE) 50 MG tablet TAKE 1 TABLET BY MOUTH  TWICE DAILY   Insulin Lispro Prot & Lispro (HUMALOG MIX 75/25 KWIKPEN) (75-25) 100 UNIT/ML Kwikpen Inject 45 Units into the skin at bedtime.   methocarbamol (ROBAXIN) 500 MG tablet Take 1 tablet (500 mg total) by mouth 2 (two) times daily.   nystatin (NYSTATIN) powder Apply topically 3 (three) times daily as needed. As needed   Omega-3 Fatty Acids (FISH OIL) 500 MG CAPS Take 500 mg by mouth daily.   omeprazole (PRILOSEC) 40 MG capsule Take 40 mg by mouth daily.   pravastatin (PRAVACHOL) 40 MG tablet TAKE 1 TABLET BY MOUTH  DAILY   Probiotic Product (PROBIOTIC PO) Take by mouth.   ROCKLATAN 0.02-0.005 % SOLN SMARTSIG:1 Drop(s) In Eye(s) Every Evening   spironolactone (ALDACTONE) 25 MG tablet TAKE 1 TABLET BY MOUTH IN  THE MORNING   TRULICITY 1.5 AT/5.5DD SOPN INJECT 1.5 MG INTO THE SKIN ONCE A WEEK. (Patient taking differently: Inject 1.5 mg as directed every Wednesday.)   valsartan-hydrochlorothiazide (DIOVAN-HCT) 160-25 MG tablet TAKE 1 TABLET BY MOUTH  DAILY   No facility-administered encounter medications on file as of 11/22/2020.   Recent Relevant Labs: Lab Results  Component Value Date/Time   HGBA1C 8.0 (H) 10/10/2020 10:41 AM   HGBA1C 7.7 (H) 06/21/2020 11:37 AM   MICROALBUR 30 10/10/2020 12:38 PM   MICROALBUR 30 10/05/2019 12:07 PM  Kidney Function Lab Results  Component Value Date/Time   CREATININE 1.60 (H) 11/14/2020 10:04 AM   CREATININE 1.97 (H) 09/14/2020 01:30 PM   GFRNONAA 34 (L) 11/14/2020 10:04 AM   GFRAA 45 (L) 06/21/2020 11:37 AM    Current antihyperglycemic regimen:  Humalog Mix 75/25 inject 45 units in to the skin at bedtime Trulicity 1.5 mg /5.4 ML - Inject 0.75  mg into the skin once a week for two weeks  What recent interventions/DTPs have been made to improve  glycemic control:  Educated on A1c and blood sugar goals Prevention and management of hypoglycemic episodes Benefits of routine self-monitoring of blood sugar Carbohydrate counting and/or plate method Counseled to check feet daily and get yearly eye exams  Have there been any recent hospitalizations or ED visits since last visit with CPP? Yes  Patient denies hypoglycemic symptoms  Patient denies hyperglycemic symptoms  How often are you checking your blood sugar? 3-4 times daily  What are your blood sugars ranging?  Fasting: 124, 120 Before meals: None After meals: 157 Bedtime: 187  During the week, how often does your blood glucose drop below 70? Never  Are you checking your feet daily/regularly? Patient stated daily  Adherence Review: Is the patient currently on a STATIN medication? Yes Is the patient currently on ACE/ARB medication? Yes Does the patient have >5 day gap between last estimated fill dates? No  NOTES: Patient states her sugars go up after eating but doesn't have any hyperglycemic symptoms. Patient states she feels her blood sugars are doing good.  Care Gaps: Shingrix overdue Covid booster overdue Medicare wellness 07-03-2021 RAF= 1.768%  Star Rating Drugs: Valsartan/HCTZ 160-25 mg- Last filled 09-20-2020 90 [facility] Pravastatin 40 mg- Last filled [id number] [facility] Trulicity 1.5 mg- Patient assistance  [facility] Pharmacist Assistant [phone]

## 2020-11-22 NOTE — Progress Notes (Signed)
I,Katawbba Wiggins,acting as a Education administrator for [employer], NP.,have documented all relevant documentation on the behalf of [employer], NP,as directed by  Bary Castilla, NP while in the presence of Bary Castilla, NP.  This visit occurred during the SARS-CoV-2 public health emergency.  Safety protocols were in place, including screening questions prior to the visit, additional usage of staff PPE, and extensive cleaning of exam room while observing appropriate contact time as indicated for disinfecting solutions.  Subjective:     Patient ID: Yvonne Miller , female    DOB: February 27, 1951 , 70 y.o.   MRN: [id number]   Chief Complaint  Patient presents with   Hospitalization Follow-up    HPI  The patient is here today for a hospital follow-up. She was in the hospital due to a HA.  In the ED she received Toradol shot and also robaxin for muscle spasms in her neck. She reports since then she has been feeling a lot better. And does not have the HA as much any more and she is doing some good exercise. In the hospital they also did a Sed rate to rule out temporal arthritis which was ruled out.  She does see a nutritionist. Today she has concern about pressure urinating and frequency in her urination. Otherwise she is doing well.     Past Medical History:  Diagnosis Date   AICD (automatic cardioverter/defibrillator) present    Arthritis    Asthma    Carpal tunnel syndrome, bilateral    CKD (chronic kidney disease)    Diabetes mellitus    Encounter for assessment of implantable cardioverter-defibrillator (ICD)    GERD (gastroesophageal reflux disease)    Hyperlipemia    Hypertension    Obesity    morbid   Sleep apnea    wears CPAP set at 12   Wears glasses      Family History  Problem Relation Age of Onset   Hypertension Mother    Stroke Mother    Heart disease Father    Heart attack Sister    Heart attack Brother      Current Outpatient Medications:    ACCU-CHEK  AVIVA PLUS test strip, CHECK 4 TIMES BY INTRADERMAL ROUTE EVERY DAY, Disp: 200 strip, Rfl: 11   albuterol (PROVENTIL HFA;VENTOLIN HFA) 108 (90 Base) MCG/ACT inhaler, Inhale 2 puffs into the lungs every 4 (four) hours as needed for wheezing or shortness of breath. , Disp: , Rfl:    amLODipine (NORVASC) 5 MG tablet, TAKE 1 TABLET BY MOUTH  DAILY, Disp: 90 tablet, Rfl: 3   aspirin EC 81 MG tablet, Take 81 mg by mouth at bedtime. , Disp: , Rfl:    B Complex Vitamins (B COMPLEX-B12) TABS, Take 1 tablet by mouth daily., Disp: , Rfl:    carvedilol (COREG) 25 MG tablet, Take 1.5 tablets (37.5 mg total) by mouth 2 (two) times daily., Disp: 270 tablet, Rfl: 3   Cholecalciferol (VITAMIN D) 50 MCG (2000 UT) tablet, Take 2,000 Units by mouth daily., Disp: , Rfl:    Chromium 1000 MCG TABS, Take 1,000 mcg by mouth daily. , Disp: , Rfl:    Cinnamon 500 MG capsule, Take 500 mg by mouth daily., Disp: , Rfl:    fluticasone (FLONASE) 50 MCG/ACT nasal spray, Place 1 spray into both nostrils daily. (Patient taking differently: Place 1 spray into both nostrils as needed for allergies.), Disp: 16 g, Rfl: 2   furosemide (LASIX) 40 MG tablet, Take 1 tablet (40 mg total) by  mouth every morning. One tab twice daily for 1 week, Disp: 60 tablet, Rfl: 2   hydrALAZINE (APRESOLINE) 50 MG tablet, TAKE 1 TABLET BY MOUTH  TWICE DAILY, Disp: 180 tablet, Rfl: 3   Insulin Lispro Prot & Lispro (HUMALOG MIX 75/25 KWIKPEN) (75-25) 100 UNIT/ML Kwikpen, Inject 45 Units into the skin at bedtime., Disp: 45 mL, Rfl: 3   methocarbamol (ROBAXIN) 500 MG tablet, Take 1 tablet (500 mg total) by mouth 2 (two) times daily., Disp: 20 tablet, Rfl: 0   nitrofurantoin, macrocrystal-monohydrate, (MACROBID) 100 MG capsule, Take 1 capsule (100 mg total) by mouth 2 (two) times daily for 7 days., Disp: 14 capsule, Rfl: 0   nystatin (NYSTATIN) powder, Apply topically 3 (three) times daily as needed. As needed, Disp: 60 g, Rfl: 2   Omega-3 Fatty Acids (FISH OIL)  500 MG CAPS, Take 500 mg by mouth daily., Disp: , Rfl:    omeprazole (PRILOSEC) 40 MG capsule, Take 40 mg by mouth daily., Disp: , Rfl:    pravastatin (PRAVACHOL) 40 MG tablet, TAKE 1 TABLET BY MOUTH  DAILY, Disp: 90 tablet, Rfl: 3   Probiotic Product (PROBIOTIC PO), Take by mouth., Disp: , Rfl:    ROCKLATAN 0.02-0.005 % SOLN, SMARTSIG:1 Drop(s) In Eye(s) Every Evening, Disp: , Rfl:    spironolactone (ALDACTONE) 25 MG tablet, TAKE 1 TABLET BY MOUTH IN  THE MORNING, Disp: 90 tablet, Rfl: 3   TRULICITY 1.5 DG/3.8VF SOPN, INJECT 1.5 MG INTO THE SKIN ONCE A WEEK. (Patient taking differently: Inject 1.5 mg as directed every Wednesday.), Disp: 2 pen, Rfl: 2   valsartan-hydrochlorothiazide (DIOVAN-HCT) 160-25 MG tablet, TAKE 1 TABLET BY MOUTH  DAILY, Disp: 90 tablet, Rfl: 3   Allergies  Allergen Reactions   Other Other (See Comments)    NO BLOOD   . Jehovah witness     Review of Systems  Constitutional:  Negative for chills and fever.  HENT:  Negative for ear pain.   Respiratory:  Negative for cough, shortness of breath and wheezing.   Cardiovascular:  Negative for chest pain and palpitations.  Gastrointestinal:  Negative for abdominal pain.  Genitourinary:  Positive for frequency. Negative for decreased urine volume, difficulty urinating, flank pain and urgency.  Musculoskeletal:  Negative for arthralgias and back pain.  Neurological:  Negative for dizziness and headaches.    Today's Vitals   11/22/20 1424  BP: 124/76  Pulse: 69  Temp: 97.9 F (36.6 C)  TempSrc: Oral  Weight: 278 lb (126.1 kg)  Height: 5\' 4"  (1.626 m)  PainSc: 0-No pain   Body mass index is 47.72 kg/m.  Wt Readings from Last 3 Encounters:  11/22/20 278 lb (126.1 kg)  11/14/20 275 lb (124.7 kg)  10/10/20 277 lb (125.6 kg)    BP Readings from Last 3 Encounters:  11/22/20 124/76  11/14/20 124/64  10/10/20 116/78    Objective:  Physical Exam Vitals and nursing note reviewed.  Constitutional:      Appearance:  Normal appearance.  HENT:     Head: Normocephalic and atraumatic.  Cardiovascular:     Rate and Rhythm: Normal rate and regular rhythm.     Pulses: Normal pulses.     Heart sounds: Normal heart sounds. No murmur heard. Pulmonary:     Effort: Pulmonary effort is normal. No respiratory distress.     Breath sounds: Normal breath sounds. No wheezing.  Skin:    General: Skin is warm and dry.     Capillary Refill: Capillary refill takes less  than 2 seconds.  Neurological:     Mental Status: She is alert.  Psychiatric:        Mood and Affect: Mood normal.        Behavior: Behavior normal.        Assessment And Plan:     1. Hospital discharge follow-up Patient is here for follow up on her recent hospital discharge. She is doing better. All of her labs were reviewed. In the hospital she received Toradol for her HA and robaxin for muscle spasms. She reports no headache today. She has no questions today .  2. Frequency of urination -Patient reports frequency in urinating, she does have a history of UTI. Will do UA and and send urine for culture. Talked about proper hygiene.  - POCT Urinalysis Dipstick (81002) - Culture, Urine  3. Leukocytes in urine -UA showed leukocytes will go ahead treat her with antibiotics and also send urine culture.  -Consider urology refer in the future  - Culture, Urine - nitrofurantoin, macrocrystal-monohydrate, (MACROBID) 100 MG capsule; Take 1 capsule (100 mg total) by mouth 2 (two) times daily for 7 days.  Dispense: 14 capsule; Refill: 0  4. Stage 3b chronic kidney disease ([place])  -Patient is being seen by nephrologist.   The patient was encouraged to call or send a message through [facility] for any questions or concerns.   Follow up: if symptoms persist or do not get better.   Side effects and appropriate use of all the medication(s) were discussed with the patient today. Patient advised to use the medication(s) as directed by their healthcare provider.  The patient was encouraged to read, review, and understand all associated package inserts and contact our office with any questions or concerns. The patient accepts the risks of the treatment plan and had an opportunity to ask questions.   Patient was given opportunity to ask questions. Patient verbalized understanding of the plan and was able to repeat key elements of the plan. All questions were answered to their satisfaction.  Raman Masiya Claassen, DNP   I, Raman Kesha Hurrell have reviewed all documentation for this visit. The documentation on 11/24/20 for the exam, diagnosis, procedures, and orders are all accurate and complete.    IF YOU HAVE BEEN REFERRED TO A SPECIALIST, IT MAY TAKE 1-2 WEEKS TO SCHEDULE/PROCESS THE REFERRAL. IF YOU HAVE NOT HEARD FROM US/SPECIALIST IN TWO WEEKS, PLEASE GIVE [place] A CALL AT [phone].   THE PATIENT IS ENCOURAGED TO PRACTICE SOCIAL DISTANCING DUE TO THE COVID-19 PANDEMIC.

## 2020-11-22 NOTE — Progress Notes (Signed)
Pt states she does have diverticulitis. Pt state she wants to know how to eat having CKD, heart disease, diverticulosis, and diabetes. Pt states for one month she took beef and pork out of her diet but then had a higher A1C.   Pt states she checks fasting: 90-120 and 2-3 hours after eating or just before lunch and before dinner and bed: 185-195 Pt states she has had diabetes for about 20 years. Pt states she needs to eat protein with her night time snack such as graham cracker and peanut butter.  Pt states she has been having diarrhea lately having started about 6-8 months ago last bought being Sunday stating she has it about once a week stating she took everything out of her diet including supplements and added things back in not finding any patterns having tried going off the Trulicity Pt states she does water aerobics 9:30-10:30 5 days a week.  Pt states she will drink the glucose control Boost sometimes.  Pt states she uses a lot of herbs rather than salt. Pt states she eats out on the weekends. Pt states she has been having a lot of UTI's. Pt state she feels the probiotic helps with her diarrhea.  Furosemide Vitamin D Probiotic Carvedilol  Pravastatin  Omeprazole  Humalog: sliding scale and every night 60 units Amlodipine Trulicity Fish oil  Labs:  Creatinine 1.6, GFR 34, BUN 25, A1C 8  Goals: Try Kefir when not taking the other probiotic Try metamucil in the morning  Get a general multivitamin with iron Limit animal meats including chicken and seafood and increase plant based protein such as beans, lentils, tofu Limit fruit to 3 servings per day Choose complex carbohydrates  Keep up your activity   Medications:Diabetes Self-Management Education  Visit Type: First/Initial   11/22/2020  Ms. Jamison Oka, identified by name and date of birth, is a 70 y.o. female with a diagnosis of Diabetes: Type 2.   ASSESSMENT  There were no vitals taken for this visit. There is no  height or weight on file to calculate BMI.   Diabetes Self-Management Education - 11/22/20 0851       Visit Information   Visit Type First/Initial      Initial Visit   Diabetes Type Type 2    Are you currently following a meal plan? No    Are you taking your medications as prescribed? Yes      Health Coping   How would you rate your overall health? Fair      Psychosocial Assessment   Patient Belief/Attitude about Diabetes Motivated to manage diabetes    Self-care barriers None    Self-management support Friends;Family    Patient Concerns Nutrition/Meal planning;Weight Control    Special Needs None    Learning Readiness Contemplating    How often do you need to have someone help you when you read instructions, pamphlets, or other written materials from your doctor or pharmacy? 1 - Never      Pre-Education Assessment   Patient understands the diabetes disease and treatment process. Needs Instruction    Patient understands incorporating nutritional management into lifestyle. Needs Instruction    Patient undertands incorporating physical activity into lifestyle. Needs Instruction    Patient understands using medications safely. Needs Instruction    Patient understands monitoring blood glucose, interpreting and using results Needs Instruction    Patient understands prevention, detection, and treatment of acute complications. Needs Instruction    Patient understands prevention, detection, and treatment of chronic complications.  Needs Instruction    Patient understands how to develop strategies to address psychosocial issues. Needs Instruction    Patient understands how to develop strategies to promote health/change behavior. Needs Instruction      Complications   Last HgB A1C per patient/outside source 8 %    How often do you check your blood sugar? 3-4 times/day    Fasting Blood glucose range (mg/dL) 70-129    Postprandial Blood glucose range (mg/dL) 180-200    Number of  hypoglycemic episodes per month 0    Number of hyperglycemic episodes per week 0    Have you had a dilated eye exam in the past 12 months? Yes    Have you had a dental exam in the past 12 months? Yes    Are you checking your feet? Yes    How many days per week are you checking your feet? 7      Dietary Intake   Breakfast 2 boiled eggs + 2 [place] bacon    Lunch grilled cheese sandwich    Snack (afternoon) fruit or peanut butter crackers or pretzels or pork rinds    Dinner cabbage + corn bread + asparagus    Snack (evening) cheese + 2 saltines    Beverage(s) coffee sometimes with no sugar flavored creamer, 3-4 bottles water, gingerale      Exercise   Exercise Type Light (walking / raking leaves);Moderate (swimming / aerobic walking)    How many days per week to you exercise? 5    How many minutes per day do you exercise? 45    Total minutes per week of exercise 225      Patient Education   Previous Diabetes Education Yes (please comment)      Individualized Goals (developed by patient)   Nutrition Follow meal plan discussed;General guidelines for healthy choices and portions discussed    Physical Activity Exercise 5-7 days per week;45 minutes per day;60 minutes per day    Medications take my medication as prescribed    Monitoring  test blood glucose pre and post meals as discussed      Post-Education Assessment   Patient understands the diabetes disease and treatment process. Demonstrates understanding / competency    Patient understands incorporating nutritional management into lifestyle. Demonstrates understanding / competency    Patient undertands incorporating physical activity into lifestyle. Demonstrates understanding / competency    Patient understands using medications safely. Demonstrates understanding / competency    Patient understands monitoring blood glucose, interpreting and using results Demonstrates understanding / competency    Patient understands prevention,  detection, and treatment of acute complications. Demonstrates understanding / competency    Patient understands prevention, detection, and treatment of chronic complications. Demonstrates understanding / competency    Patient understands how to develop strategies to address psychosocial issues. Demonstrates understanding / competency    Patient understands how to develop strategies to promote health/change behavior. Demonstrates understanding / competency      Outcomes   Expected Outcomes Demonstrated interest in learning. Expect positive outcomes    Future DMSE PRN    Program Status Completed             Individualized Plan for Diabetes Self-Management Training:   Learning Objective:  Patient will have a greater understanding of diabetes self-management. Patient education plan is to attend individual and/or group sessions per assessed needs and concerns.   Expected Outcomes:  Demonstrated interest in learning. Expect positive outcomes  Education material provided: ADA - How to Thrive: A  Guide for Your Journey with Diabetes and Snack sheet  If problems or questions, patient to contact team via:  Phone and Email  Future DSME appointment: 6 weeks

## 2020-11-24 MED ORDER — NITROFURANTOIN MONOHYD MACRO 100 MG PO CAPS: 100.0000 mg | ORAL_CAPSULE | Freq: Two times a day (BID) | ORAL | 0 refills | Status: AC

## 2020-11-25 LAB — URINE CULTURE

## 2020-11-29 ENCOUNTER — Other Ambulatory Visit: Payer: Self-pay

## 2020-11-29 ENCOUNTER — Ambulatory Visit (INDEPENDENT_AMBULATORY_CARE_PROVIDER_SITE_OTHER): Payer: Medicare Other

## 2020-11-29 ENCOUNTER — Telehealth: Payer: Medicare Other

## 2020-11-29 DIAGNOSIS — I10 Essential (primary) hypertension: Secondary | ICD-10-CM

## 2020-11-29 DIAGNOSIS — I5032 Chronic diastolic (congestive) heart failure: Secondary | ICD-10-CM

## 2020-11-29 DIAGNOSIS — Z794 Long term (current) use of insulin: Secondary | ICD-10-CM

## 2020-11-29 DIAGNOSIS — E785 Hyperlipidemia, unspecified: Secondary | ICD-10-CM

## 2020-11-29 DIAGNOSIS — N1832 Chronic kidney disease, stage 3b: Secondary | ICD-10-CM

## 2020-11-29 DIAGNOSIS — E1122 Type 2 diabetes mellitus with diabetic chronic kidney disease: Secondary | ICD-10-CM | POA: Diagnosis not present

## 2020-12-04 DIAGNOSIS — H401131 Primary open-angle glaucoma, bilateral, mild stage: Secondary | ICD-10-CM | POA: Diagnosis not present

## 2020-12-04 DIAGNOSIS — E119 Type 2 diabetes mellitus without complications: Secondary | ICD-10-CM | POA: Diagnosis not present

## 2020-12-04 LAB — HM DIABETES EYE EXAM

## 2020-12-05 NOTE — Chronic Care Management (AMB) (Signed)
Chronic Care Management   CCM RN Visit Note  11/29/2020 Name: Yvonne Miller MRN: [id number] DOB: Jun 14, 1950  Subjective: Yvonne Miller is a 70 y.o. year old female who is a primary care patient of [name], MD. The care management team was consulted for assistance with disease management and care coordination needs.    Engaged with patient by telephone for follow up visit in response to provider referral for case management and/or care coordination services.   Consent to Services:  The patient was given information about Chronic Care Management services, agreed to services, and gave verbal consent prior to initiation of services.  Please see initial visit note for detailed documentation.   Patient agreed to services and verbal consent obtained.   Assessment: Review of patient past medical history, allergies, medications, health status, including review of consultants reports, laboratory and other test data, was performed as part of comprehensive evaluation and provision of chronic care management services.   SDOH (Social Determinants of Health) assessments and interventions performed: Yes, no acute challenges   CCM Care Plan  Allergies  Allergen Reactions   Other Other (See Comments)    NO BLOOD   . Jehovah witness    Outpatient Encounter Medications as of 11/29/2020  Medication Sig   ACCU-CHEK AVIVA PLUS test strip CHECK 4 TIMES BY INTRADERMAL ROUTE EVERY DAY   albuterol (PROVENTIL HFA;VENTOLIN HFA) 108 (90 Base) MCG/ACT inhaler Inhale 2 puffs into the lungs every 4 (four) hours as needed for wheezing or shortness of breath.    amLODipine (NORVASC) 5 MG tablet TAKE 1 TABLET BY MOUTH  DAILY   aspirin EC 81 MG tablet Take 81 mg by mouth at bedtime.    B Complex Vitamins (B COMPLEX-B12) TABS Take 1 tablet by mouth daily.   carvedilol (COREG) 25 MG tablet Take 1.5 tablets (37.5 mg total) by mouth 2 (two) times daily.   Cholecalciferol (VITAMIN D) 50 MCG (2000 UT) tablet Take  2,000 Units by mouth daily.   Chromium 1000 MCG TABS Take 1,000 mcg by mouth daily.    Cinnamon 500 MG capsule Take 500 mg by mouth daily.   furosemide (LASIX) 40 MG tablet Take 1 tablet (40 mg total) by mouth every morning. One tab twice daily for 1 week   hydrALAZINE (APRESOLINE) 50 MG tablet TAKE 1 TABLET BY MOUTH  TWICE DAILY   Insulin Lispro Prot & Lispro (HUMALOG MIX 75/25 KWIKPEN) (75-25) 100 UNIT/ML Kwikpen Inject 45 Units into the skin at bedtime.   methocarbamol (ROBAXIN) 500 MG tablet Take 1 tablet (500 mg total) by mouth 2 (two) times daily.   [EXPIRED] nitrofurantoin, macrocrystal-monohydrate, (MACROBID) 100 MG capsule Take 1 capsule (100 mg total) by mouth 2 (two) times daily for 7 days.   nystatin (NYSTATIN) powder Apply topically 3 (three) times daily as needed. As needed   Omega-3 Fatty Acids (FISH OIL) 500 MG CAPS Take 500 mg by mouth daily.   omeprazole (PRILOSEC) 40 MG capsule Take 40 mg by mouth daily.   pravastatin (PRAVACHOL) 40 MG tablet TAKE 1 TABLET BY MOUTH  DAILY   Probiotic Product (PROBIOTIC PO) Take by mouth.   ROCKLATAN 0.02-0.005 % SOLN SMARTSIG:1 Drop(s) In Eye(s) Every Evening   spironolactone (ALDACTONE) 25 MG tablet TAKE 1 TABLET BY MOUTH IN  THE MORNING   TRULICITY 1.5 TM/1.9QQ SOPN INJECT 1.5 MG INTO THE SKIN ONCE A WEEK. (Patient taking differently: Inject 1.5 mg as directed every Wednesday.)   valsartan-hydrochlorothiazide (DIOVAN-HCT) 160-25 MG tablet TAKE 1  TABLET BY MOUTH  DAILY   No facility-administered encounter medications on file as of 11/29/2020.    Patient Active Problem List   Diagnosis Date Noted   Recurrent UTI 10/10/2020   Class 3 severe obesity due to excess calories with serious comorbidity and body mass index (BMI) of 45.0 to 49.9 in adult [facility]) 10/10/2020   Non-ischemic cardiomyopathy ([place]) 02/18/2019   Encounter for assessment of implantable cardioverter-defibrillator (ICD)    Type 2 diabetes mellitus with stage 3 chronic kidney  disease, with long-term current use of insulin ([place]) 03/04/2018   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   Chronic combined systolic and diastolic congestive heart failure ([place]) 03/04/2018   Chronic renal disease, stage II 03/04/2018   Status post revision of total replacement of left knee 01/26/2018   Unilateral primary osteoarthritis, left knee 08/18/2017   Biventricular automatic implantable cardioverter defibrillator in situ 12/13/2013   ICD -  Biventricular  [employer] Dynagen X4 CRT-D Model G158 ICD in place 10/28/13 10/28/2013   HTN (hypertension) 10/03/2013   Hyperlipemia 10/03/2013   LBBB (left bundle branch block) 10/03/2013   Cardiomyopathy ([place]) 10/03/2013   DM (diabetes mellitus) ([employer]) 10/03/2013    Conditions to be addressed/monitored: DM type 2, HTN, HLD, CKD, CHF     Care Plan : Impaired Urinary Elimination  Updates made by Lynne Logan, RN since 11/29/2020 12:00 AM     Problem: Impaired Urinary Elimination (reoccurring UTI's)   Priority: High     Long-Range Goal: Impaired Urinary Elimination   Start Date: 09/03/2020  Expected End Date: 09/03/2021  Recent Progress: On track  Priority: High  Note:   Current Barriers:  Ineffective Self Health Maintenance  Clinical Goal(s):  Collaboration with [name], MD regarding development and update of comprehensive plan of care as evidenced by provider attestation and co-signature Inter-disciplinary care team collaboration (see longitudinal plan of care) patient will work with care management team to address care coordination and chronic disease management needs related to Disease Management Educational Needs Care Coordination Medication Management and Education Psychosocial Support   Interventions:  11/29/20 completed successful outbound call with patient  Evaluation of current treatment plan related to  Impaired Urinary Elimination , self-management and patient's adherence to plan as established by  provider. Collaboration with [name], MD regarding development and update of comprehensive plan of care as evidenced by provider attestation       and co-signature Inter-disciplinary care team collaboration (see longitudinal plan of care) Provided education to patient about basic disease process related to reoccurring UTI's Review of patient status, including review of consultants reports, relevant laboratory and other test results, and medications completed. Reviewed medications with patient and discussed importance of medication adherence Educated patient on perineal hygiene; Educated patient on the importance of increasing water intake to 64 oz daily unless otherwise daily  Instructed patient to report early symptoms of UTI promptly for early diagnosis/treatment  Instructed patient on prescribed treatment, reviewed indication, dosage and frequency Determined patient continues to have GI symptoms that may be contributing to her recurrent UTI, PCP is aware Discussed plans with patient for ongoing care management follow up and provided patient with direct contact information for care management team Self Care Activities:  Continue to keep all scheduled follow up appointments Take medications as directed  Let your healthcare team know if you are unable to take your medications Call your pharmacy for refills at least 7 days prior to running out of medication Increase water intake to 64 oz  daily unless otherwise directed Use perineal hygiene as discussed  Complete all oral antibiotics as prescribed for treatment of UTI Report reoccurring symptoms of UTI promptly for early diagnosis/treatment Patient Goals: - resolve reoccurring UTI's   Follow Up Plan: Telephone follow up appointment with care management team member scheduled for: 02/14/21     Care Plan : Altered Bowel Elimination  Updates made by Riley Churches, RN since 11/29/2020 12:00 AM     Problem: Altered Bowel Elimination    Priority: High     Long-Range Goal: Altered Bowel Elimination complications prevented or minimized   Start Date: 11/29/2020  Expected End Date: 11/29/2021  This Visit's Progress: On track  Priority: High  Note:   Current Barriers:  Ineffective Self Health Maintenance in a patient with DM type 2, HTN, HLD, CKD, CHF Clinical Goal(s):  Collaboration with [name], MD regarding development and update of comprehensive plan of care as evidenced by provider attestation and co-signature Inter-disciplinary care team collaboration (see longitudinal plan of care) patient will work with care management team to address care coordination and chronic disease management needs related to Disease Management Educational Needs Care Coordination Medication Management and Education Psychosocial Support   Interventions:  11/29/20 completed successful outbound call with patient Evaluation of current treatment plan related to  Altered Bowel Elimination , self-management and patient's adherence to plan as established by provider. Collaboration with [name], MD regarding development and update of comprehensive plan of care as evidenced by provider attestation       and co-signature Inter-disciplinary care team collaboration (see longitudinal plan of care) Determined patient continues to have altered bowel elimination with recurrent moderate to severe diarrhea Educated on the importance to stay well hydrated, recommended supplementing with Gatorade to replace electrolytes and or to drink water with electrolytes; Educated on perineal hygiene Determined patient has experienced an 8-10 lb weight loss due to having severe diarrhea, patient also reports nausea and loss of appetite Determined patient was evaluated by Dr. Loreta Ave, Gastroenterologist for which she would like a second opinion  Collaborated with Dr. Allyne Gee concerning patients GI symptoms and her request for a new GI referral for second opinion, Dr.  Allyne Gee will send a new referral Instructed patient per Dr. Allyne Gee the importance of avoiding foods/drinks with sucrose and to use ginger tea as desired, avoid caffeine  Recommended patient keep a food journal to include all oral intake including all foods/drinks and medications, instructed to record all bowel movements to include the time of each bowel movement and the consistency of the stool  Instructed patient to provide the food journal to GI and PCP at next visit to help determine any contributing factors  Discussed plans with patient for ongoing care management follow up and provided patient with direct contact information for care management team Self Care Activities:  Self administers medications as prescribed Attends all scheduled provider appointments Calls pharmacy for medication refills Calls provider office for new concerns or questions Patient Goals: - follow up with GI for evaluation of Altered bowel elimination  - keep a food journal to include all oral intake including all foods/drinks and medications, include/record all bowel movements to include time of the bowel movement and the consistency of the stool  - provide food journal to GI and PCP at next visit to help determine any contributing factors   Follow Up Plan: Telephone follow up appointment with care management team member scheduled for: 02/14/21     Care Plan : Diabetes Type 2 (Adult)  Updates made by Lynne Logan, RN since 11/29/2020 12:00 AM     Problem: Glycemic Management (Diabetes, Type 2)   Priority: High     Long-Range Goal: Glycemic Management Optimized   Start Date: 11/29/2020  Expected End Date: 11/29/2021  This Visit's Progress: On track  Priority: High  Note:   Objective:  Lab Results  Component Value Date   HGBA1C 8.0 (H) 10/10/2020   Lab Results  Component Value Date   CREATININE 1.60 (H) 11/14/2020   CREATININE 1.97 (H) 09/14/2020   CREATININE 1.57 (H) 08/31/2020   Lab Results   Component Value Date   EGFR 27 (L) 09/14/2020   Current Barriers:  Knowledge Deficits related to basic Diabetes pathophysiology and self care/management Knowledge Deficits related to medications used for management of diabetes Case Manager Clinical Goal(s):  patient will demonstrate improved adherence to prescribed treatment plan for diabetes self care/management as evidenced by: daily monitoring and recording of CBG  adherence to ADA/ carb modified diet exercise 5 days/week adherence to prescribed medication regimen contacting provider for new or worsened symptoms or questions Interventions:  11/29/20 completed successful outbound call with patient  Collaboration with [name], MD regarding development and update of comprehensive plan of care as evidenced by provider attestation and co-signature Inter-disciplinary care team collaboration (see longitudinal plan of care) Provided education to patient about basic DM disease process Review of patient status, including review of consultant's reports, relevant laboratory and other test results, and medications completed. Reviewed medications with patient and discussed importance of medication adherence Educated patient on dietary and exercise recommendations; daily glycemic control FBS 80-130, <180 after meals;15'15' rule Advised patient, providing education and rationale, to check cbg daily before meals and at bedtime and record, calling the CCM team and or PCP for findings outside established parameters Mailed printed educational materials related to Diabetes Management  Reviewed scheduled/upcoming provider appointments including: next PCP follow up appointment scheduled for 02/12/21 $[id number]'@[id number]$ :30 AM  Discussed plans with patient for ongoing care management follow up and provided patient with direct contact information for care management team Self-Care Activities Self administers oral medications as prescribed Self administers insulin as  prescribed Self administers injectable DM medication (Trulicity) as prescribed Attends all scheduled provider appointments Checks blood sugars as prescribed and utilize hyper and hypoglycemia protocol as needed Adheres to prescribed ADA/carb modified Patient Goals: - check blood sugar at prescribed times - check blood sugar before and after exercise - check blood sugar if I feel it is too high or too low - enter blood sugar readings and medication or insulin into daily log - take the blood sugar log to all doctor visits - take the blood sugar meter to all doctor visits - drink 6 to 8 glasses of water each day - manage portion size - keep appointment with eye doctor - schedule appointment with eye doctor - check feet daily for cuts, sores or redness - do heel pump exercise 2 to 3 times each day - keep feet up while sitting - trim toenails straight across - wash and dry feet carefully every day - wear comfortable, cotton socks - wear comfortable, well-fitting shoes  Follow Up Plan: Telephone follow up appointment with care management team member scheduled for: 02/14/21     Plan:Telephone follow up appointment with care management team member scheduled for:  02/14/21  Barb Merino, RN, BSN, CCM Care Management Coordinator [facility]  Direct Phone: [phone]

## 2020-12-05 NOTE — Patient Instructions (Addendum)
Goals Addressed      Altered Bowel Elimination complications prevented or minized   On track    Timeframe:  Long-Range Goal Priority:  High Start Date: 11/29/20                            Expected End Date: 11/29/21          Next Scheduled follow up: 02/14/21    Self Care Activities:  Self administers medications as prescribed Attends all scheduled provider appointments Calls pharmacy for medication refills Calls provider office for new concerns or questions Patient Goals: - follow up with GI for evaluation of Altered bowel elimination  - keep a food journal to include all oral intake including all foods/drinks and medications, include/record all bowel movements to include time of the bowel movement and the consistency of the stool  - provide food journal to GI and PCP at next visit to help determine any contributing factors                 Glycemic Management treatment optimized   On track    Timeframe:  Long-Range Goal Priority:  High Start Date:  11/29/20                           Expected End Date: 11/29/21          Next Scheduled Follow up: 02/14/21        -check blood sugar at prescribed times - check blood sugar before and after exercise - check blood sugar if I feel it is too high or too low - enter blood sugar readings and medication or insulin into daily log - take the blood sugar log to all doctor visits - take the blood sugar meter to all doctor visits - drink 6 to 8 glasses of water each day - manage portion size            Impaired Urinary Elimination - Reoccurring UTI's   On track    Timeframe:  Short-Term Goal Priority:  High Start Date:  09/03/20                           Expected End Date:  09/03/21   Next Follow up date:  02/14/21  Self Care Activities:  Continue to keep all scheduled follow up appointments Take medications as directed  Let your healthcare team know if you are unable to take your medications Call your pharmacy for refills at least 7 days  prior to running out of medication Increase water intake to 64 oz daily unless otherwise directed Use perineal hygiene as discussed  Complete all oral antibiotics as prescribed for treatment of UTI Report reoccurring symptoms of UTI promptly for early diagnosis/treatment Patient Goals: - resolve reoccurring UTI's                      Symptom Exacerbation Prevented or Minimized - Heart Failure       Timeframe:  Long-Range Goal Priority:  High Start Date:  09/03/20                           Expected End Date:  09/03/21   Next Follow up date:  02/14/21  Self-Care Activities:  Takes Heart Failure Medications as prescribed Weighs daily and record (notifying MD of 3 lb weight  gain over night or 5 lb in a week) Verbalizes understanding of and follows CHF Action Plan Adheres to low sodium diet  Patient Goals:  - Take Heart Failure Medications as prescribed - Weigh daily and record (notify MD with 3 lb weight gain over night or 5 lb in a week) - Follow CHF Action Plan - Adhere to low sodium diet - develop a rescue plan - follow rescue plan if symptoms flare-up - know when to call the doctor - track symptoms and what helps feel better or worse                      Track and Manage My Blood Pressure-Hypertension       Timeframe:  Long-Range Goal Priority:  High Start Date:  09/03/20                          Expected End Date: 09/03/21                     Follow Up Date: 02/14/21    - check blood pressure 3 times per week - check blood pressure daily - check blood pressure weekly - choose a place to take my blood pressure (home, clinic or office, retail store) - write blood pressure results in a log or diary    Why is this important?   You won't feel high blood pressure, but it can still hurt your blood vessels.  High blood pressure can cause heart or kidney problems. It can also cause a stroke.  Making lifestyle changes like losing a Joyce Heitman weight or eating less salt will help.   Checking your blood pressure at home and at different times of the day can help to control blood pressure.  If the doctor prescribes medicine remember to take it the way the doctor ordered.  Call the office if you cannot afford the medicine or if there are questions about it.     Notes:

## 2020-12-06 DIAGNOSIS — N390 Urinary tract infection, site not specified: Secondary | ICD-10-CM | POA: Diagnosis not present

## 2020-12-10 ENCOUNTER — Encounter: Payer: Self-pay | Admitting: Internal Medicine

## 2020-12-25 ENCOUNTER — Encounter: Payer: Self-pay | Admitting: Internal Medicine

## 2020-12-25 DIAGNOSIS — R921 Mammographic calcification found on diagnostic imaging of breast: Secondary | ICD-10-CM | POA: Diagnosis not present

## 2020-12-25 LAB — HM MAMMOGRAPHY

## 2020-12-28 ENCOUNTER — Other Ambulatory Visit: Payer: Self-pay

## 2020-12-28 ENCOUNTER — Encounter: Payer: Self-pay | Admitting: Cardiology

## 2020-12-28 ENCOUNTER — Ambulatory Visit: Payer: Medicare Other | Admitting: Cardiology

## 2020-12-28 VITALS — BP 132/68 | HR 69 | Temp 97.8°F | Ht 64.0 in | Wt 273.0 lb

## 2020-12-28 DIAGNOSIS — Z95810 Presence of automatic (implantable) cardiac defibrillator: Secondary | ICD-10-CM | POA: Diagnosis not present

## 2020-12-28 DIAGNOSIS — I5042 Chronic combined systolic (congestive) and diastolic (congestive) heart failure: Secondary | ICD-10-CM

## 2020-12-28 DIAGNOSIS — I428 Other cardiomyopathies: Secondary | ICD-10-CM | POA: Diagnosis not present

## 2020-12-28 DIAGNOSIS — N1831 Chronic kidney disease, stage 3a: Secondary | ICD-10-CM | POA: Diagnosis not present

## 2020-12-28 NOTE — Progress Notes (Addendum)
Primary Physician/Referring:  [name], MD  Patient ID: Yvonne Miller, female    DOB: [birth year], 70 y.o.   MRN: [id number]  Chief Complaint  Patient presents with   CHF   Follow-up    HPI:    Yvonne Miller  is a 71 y.o. 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  She also has morbid obesity, OSA not on CPAP recently, hypertension, DM with stage 3b CKD, hyperlipidemia, degenerative joint disease and recent diagnosis of diabetic gastroparesis.  She presents to the office for 6-week follow-up of shortness of breath, not feeling well.  She was diagnosed with UTI and vaginitis and is being treated for the same, since then all her symptoms of fatigue, dyspnea has resolved.  She states that she is back to baseline and has started to exercise and has returned back to work.  Past Medical History:  Diagnosis Date   AICD (automatic cardioverter/defibrillator) present    Arthritis    Asthma    Carpal tunnel syndrome, bilateral    CKD (chronic kidney disease)    Diabetes mellitus    Encounter for assessment of implantable cardioverter-defibrillator (ICD)    GERD (gastroesophageal reflux disease)    Hyperlipemia    Hypertension    Obesity    morbid   Sleep apnea    wears CPAP set at 12   Wears glasses    Past Surgical History:  Procedure Laterality Date   ABDOMINAL HYSTERECTOMY     BIOPSY  03/01/2019   Procedure: BIOPSY;  Surgeon: Juanita Craver, MD;  Location: [facility];  Service: Endoscopy;;   BREAST SURGERY  1968   breast mass excision   CARPAL TUNNEL RELEASE Left 07/10/2017   Procedure: CARPAL TUNNEL RELEASE;  Surgeon: Ashok Pall, MD;  Location: [place];  Service: Neurosurgery;  Laterality: Left;  left   CARPAL TUNNEL RELEASE Right 03/19/2018   Procedure: RIGHT CARPAL TUNNEL RELEASE;  Surgeon: Ashok Pall, MD;  Location: [place];  Service: Neurosurgery;  Laterality: Right;  right   CERVICAL SPINE SURGERY  2006   CHOLECYSTECTOMY     COLONOSCOPY WITH PROPOFOL N/A 03/01/2019   Procedure: COLONOSCOPY WITH PROPOFOL;  Surgeon: Juanita Craver, MD;  Location: [facility];  Service: Endoscopy;  Laterality: N/A;   DILATION AND CURETTAGE OF UTERUS     ICD IMPLANT     JOINT REPLACEMENT     TONSILLECTOMY  1961   TOTAL KNEE ARTHROPLASTY Left 01/26/2018   Procedure: LEFT TOTAL KNEE ARTHROPLASTY;  Surgeon: Mcarthur Rossetti, MD;  Location: [place];  Service: Orthopedics;  Laterality: Left;   Family History  Problem Relation Age of Onset   Hypertension Mother    Stroke Mother    Heart disease Father    Heart attack Sister    Heart attack Brother     Social History   Tobacco Use   Smoking status: Never   Smokeless tobacco: Never  Substance Use Topics   Alcohol use: No   Marital Status: Married  ROS  Review of Systems  Constitutional: Negative for malaise/fatigue.  Cardiovascular:  Positive for dyspnea on exertion and leg swelling (lymphedema left leg and wears support stocking). Negative for syncope.  Respiratory:  Positive for snoring (sleep apnea not using CPAP).   Musculoskeletal:  Positive for arthritis (bilateral knee).  Gastrointestinal:  Positive for change in bowel habit. Negative for melena.  Genitourinary:  Negative for dysuria.  Neurological:  Negative for headaches and light-headedness.  Objective  Blood pressure 132/68, pulse 69, temperature 97.8 F (36.6 C), height 5\' 4"  (1.626 m), weight 273 lb (123.8 kg), SpO2 95 %.  Vitals with BMI 12/28/2020 11/22/2020 11/22/2020  Height 5\' 4"  5\' 4"  -  Weight 273 lbs 278 lbs (No Data)  BMI 09.32 67.1 -  Systolic 245 809 -  Diastolic 68 76 -  Pulse 69 69 -     Physical Exam Constitutional:      Appearance: She is well-developed. She is obese.     Comments: Morbidly Obese   Neck:     Thyroid: No thyromegaly.     Vascular: No JVD.     Comments: Short  neck and difficult to evaluate JVP  Cardiovascular:     Rate and Rhythm: Normal rate and regular rhythm.     Pulses: Normal pulses and intact distal pulses.          Carotid pulses are 2+ on the right side and 2+ on the left side.    Heart sounds: No murmur heard.   No gallop.     Comments: Femoral and popliteal pulse difficult to feel due to patient's body habitus.  Pulmonary:     Effort: Pulmonary effort is normal. No accessory muscle usage or respiratory distress.     Breath sounds: Normal breath sounds.  Abdominal:     General: Bowel sounds are normal.     Palpations: Abdomen is soft.     Comments: Obese. Pannus present   Musculoskeletal:        General: Swelling (Edema: Non pitting left leg below knee present. Trace right leg edema.) present. Normal range of motion.     Cervical back: Neck supple.  Skin:    General: Skin is warm and dry.     Capillary Refill: Capillary refill takes less than 2 seconds.  Neurological:     Mental Status: She is alert.   Laboratory examination:   Recent Labs    02/07/20 1050 06/21/20 1137 08/31/20 1036 09/14/20 1330 09/25/20 1244 11/14/20 1004  NA 137 138 142 137  --  135  K 4.8 4.7 5.5* 4.3  --  4.1  CL 100 103 104 98  --  100  CO2 22 19* 24 23  --  27  GLUCOSE 74 129* 136* 179*  --  116*  BUN 27 22 27  42*  --  25*  CREATININE 1.38* 1.37* 1.57* 1.97*  --  1.60*  CALCIUM 9.5 9.3 9.6 9.1 9.7 8.9  GFRNONAA 39* 39*  --   --   --  34*  GFRAA 45* 45*  --   --   --   --    CrCl cannot be calculated (Patient's most recent lab result is older than the maximum 21 days allowed.).  CMP Latest Ref Rng & Units 11/14/2020 10/10/2020 09/25/2020  Glucose 70 - 99 mg/dL 116(H) - -  BUN 8 - 23 mg/dL 25(H) - -  Creatinine 0.44 - 1.00 mg/dL 1.60(H) - -  Sodium 135 - 145 mmol/L 135 - -  Potassium 3.5 - 5.1 mmol/L 4.1 - -  Chloride 98 - 111 mmol/L 100 - -  CO2 22 - 32 mmol/L 27 - -  Calcium 8.9 - 10.3 mg/dL 8.9 - 9.7  Total Protein 6.0 - 8.5 g/dL - 7.6  7.7  Total Bilirubin 0.0 - 1.2 mg/dL - 0.2 -  Alkaline Phos 44 - 121 IU/L - 105 -  AST 0 - 40 IU/L - 19 -  ALT 0 - 32 IU/L - 15 -   CBC Latest Ref Rng & Units 11/14/2020 06/21/2020 10/05/2019  WBC 4.0 - 10.5 K/uL 10.1 10.5 10.6  Hemoglobin 12.0 - 15.0 g/dL 12.0 11.6 11.5  Hematocrit 36.0 - 46.0 % 37.7 36.8 36.1  Platelets 150 - 400 K/uL 300 333 328   Lipid Panel     Component Value Date/Time   CHOL 124 06/21/2020 1137   TRIG 117 06/21/2020 1137   HDL 42 06/21/2020 1137   CHOLHDL 3.0 06/21/2020 1137   CHOLHDL 3.8 04/22/2009 0603   VLDL 45 (H) 04/22/2009 0603   LDLCALC 61 06/21/2020 1137   HEMOGLOBIN A1C Lab Results  Component Value Date   HGBA1C 8.0 (H) 10/10/2020   MPG 165.68 01/15/2018   TSH Recent Labs    06/21/20 1137  TSH 1.110   BNP (last 3 results) Recent Labs    08/31/20 1037 09/14/20 1331  BNP 44.3 28.6   Medications and allergies   Allergies  Allergen Reactions   Other Other (See Comments)    NO BLOOD   . Jehovah witness     Current Outpatient Medications  Medication Instructions   ACCU-CHEK AVIVA PLUS test strip CHECK 4 TIMES BY INTRADERMAL ROUTE EVERY DAY   albuterol (PROVENTIL HFA;VENTOLIN HFA) 108 (90 Base) MCG/ACT inhaler 2 puffs, Inhalation, Every 4 hours PRN   amLODipine (NORVASC) 5 MG tablet TAKE 1 TABLET BY MOUTH  DAILY   aspirin EC 81 mg, Oral, Daily at bedtime   B Complex Vitamins (B COMPLEX-B12) TABS 1 tablet, Oral, Daily   carvedilol (COREG) 37.5 mg, Oral, 2 times daily   cephALEXin (KEFLEX) 250 mg, Oral, Daily   Chromium 1,000 mcg, Oral, Daily   Cinnamon 500 mg, Oral, Daily   estradiol (ESTRACE) 0.1 MG/GM vaginal cream Vaginal   Fish Oil 500 mg, Oral, Daily   fluticasone (FLONASE) 50 MCG/ACT nasal spray 1 spray, Each Nare, Daily   furosemide (LASIX) 40 mg, Oral, BH-each morning, One tab twice daily for 1 week   hydrALAZINE (APRESOLINE) 50 MG tablet TAKE 1 TABLET BY MOUTH  TWICE DAILY   Insulin Lispro Prot & Lispro (HUMALOG MIX 75/25  KWIKPEN) (75-25) 100 UNIT/ML Kwikpen 45 Units, Subcutaneous, Nightly   nystatin (NYSTATIN) powder Topical, 3 times daily PRN, As needed   omeprazole (PRILOSEC) 40 mg, Oral, Daily   pravastatin (PRAVACHOL) 40 MG tablet TAKE 1 TABLET BY MOUTH  DAILY   Probiotic Product (PROBIOTIC PO) Oral   ROCKLATAN 0.02-0.005 % SOLN SMARTSIG:1 Drop(s) In Eye(s) Every Evening   spironolactone (ALDACTONE) 25 MG tablet TAKE 1 TABLET BY MOUTH IN  THE MORNING   TRULICITY 1.5 QQ/7.6PP SOPN INJECT 1.5 MG INTO THE SKIN ONCE A WEEK.   valsartan-hydrochlorothiazide (DIOVAN-HCT) 160-25 MG tablet 1 tablet, Oral, Daily   Vitamin D 2,000 Units, Oral, Daily   Radiology:   No results found.  Cardiac Studies:   Sleep study 2010 (sleep apnea-has a CPAP but doesn't use it every night).  Coronary Angiography  R+ L 03/04/11: and in 2010 Normal coronary arteries. Moderate pulmonary hypertension.  PCV ECHOCARDIOGRAM COMPLETE 10/30/2020 Left ventricle cavity is normal in size and wall thickness. Normal global wall motion. Normal LV systolic function with visual EF 50-55%. Doppler evidence of grade I (impaired) diastolic dysfunction, normal LAP. Left atrial cavity is mildly dilated. Trileaflet aortic valve.  Trace aortic stenosis. Trace aortic regurgitation. Mild (Grade I) mitral regurgitation. Mild to moderate tricuspid regurgitation. Estimated pulmonary  artery systolic pressure 31 mmHg. No significant change compared to previous study in 2016.   Scheduled  In office ICD 08/31/20:  Single (S)/Dual (D)/BV (M) B Presenting ASBP Pacer dependant: No. Underlying NSR @ 70/min. AP 0%, BP 100.% AMS Episodes 0. HVR 0.  Longevity 3.5 Years/Voltage.  Lead measurements: Stable Histogram: Low (L)/normal (N)/high (H)  Normal Patient activity Normal. Thoracic impedance: NA  Observations: Normal ICD function.  Changes: None  Schedule remote biventricular ICD transmission 10/18/2020: Longevity >3 years. Lead impedance and  thresholds within normal limits. AP <1%, BP 100%. There were no high atrial rate or ventricular rate episodes.   EKG   EKG 08/31/2020: Atrially sensed and biventricular paced rhythm.  No further analysis.  No significant change from 4/29/202  Assessment     ICD-10-CM   1. Non-ischemic cardiomyopathy (HCC)  I42.8     2. Chronic combined systolic and diastolic heart failure (HCC)  I50.42     3. ICD: Biventricular  [employer] Dynagen X4 CRT-D Model G158 ICD in place 10/28/13  Z95.810     4. Stage 3a chronic kidney disease (HCC)  N18.31       No orders of the defined types were placed in this encounter.   No orders of the defined types were placed in this encounter.   Medications Discontinued During This Encounter  Medication Reason   methocarbamol (ROBAXIN) 500 MG tablet Error    Recommendations:   Yvonne Miller  is a 70 y.o.  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  She also has morbid obesity, OSA not on CPAP recently, hypertension, DM with stage 3b CKD, hyperlipidemia, degenerative joint disease and recent diagnosis of diabetic gastroparesis.  Patient was last seen by me 6 weeks ago, she was doing very poorly with worsening fatigue, dyspnea and also leg edema.  She is eventually diagnosed with recurrent UTI, has not been adequately treated and is presently on antibiotic low-dose chronically.  Symptoms of heart failure has completely resolved.  She is back to her baseline.  ICD is functioning normally.  I reviewed the echocardiogram, fortunately there is no change from echocardiogram done in 2016.  Although she has multiple medical comorbidity that includes diabetes, hypertension, obesity and heart failure, she does extremely well and has remained stable for the many years.  No changes in the medications were done today.   She does have underlying stage IIIa to stage IIIb chronic kidney disease, in view of this I would recommend repeating her BMP, she is seeing Dr. [name] next month and this can be done then.  Otherwise she is stable from cardiac standpoint, I will see her back in 6 months for follow-up.     Adrian Prows, MD, [facility] 12/28/2020, 4:47 PM Office: [phone] Pager: [phone]

## 2021-01-04 ENCOUNTER — Telehealth: Payer: Self-pay

## 2021-01-04 NOTE — Chronic Care Management (AMB) (Signed)
Chronic Care Management Pharmacy Assistant   Name: Yvonne Miller  MRN: [id number] DOB: 09/02/1950  01/04/2021-  APPOINTMENT REMINDER   Called Judie Bonus Weatherman, No answer, left message of appointment on 01/08/2021 at 8:15 am via telephone visit with [name], Pharm D. Notified to have all medications, supplements, blood pressure and/or blood sugar logs available during appointment and to return call if need to reschedule.  Care Gaps: Zoster Vaccines- Shingrix- Overdue - never done COVID-19 Vaccine (4 - Booster for [employer] series)- Last completed: Feb 21, 2020  INFLUENZA VACCINE (Every 8 Months, August to March)- Last completed: Feb 07, 2020  Annual Wellness Visit completed on 06/21/2020, next scheduled 07/03/2021.   Hemglobin A1C- 8.0 % 10/10/2020  RAF score: 1.768%  Star Rating Drug: Pravastatin 40 mg- Last filled 12/14/2020 for 90 Day supply Trulicity- PAP Valsartan-HCTZ 160/25 mg- Last filled 12/14/2020 for 90 day supply.  Any gaps in medications fill history? Spironolactone 25 mg- last filled 08/12/2020 for 90 day supply   Medications: Outpatient Encounter Medications as of 01/04/2021  Medication Sig   ACCU-CHEK AVIVA PLUS test strip CHECK 4 TIMES BY INTRADERMAL ROUTE EVERY DAY   albuterol (PROVENTIL HFA;VENTOLIN HFA) 108 (90 Base) MCG/ACT inhaler Inhale 2 puffs into the lungs every 4 (four) hours as needed for wheezing or shortness of breath.    amLODipine (NORVASC) 5 MG tablet TAKE 1 TABLET BY MOUTH  DAILY   aspirin EC 81 MG tablet Take 81 mg by mouth at bedtime.    B Complex Vitamins (B COMPLEX-B12) TABS Take 1 tablet by mouth daily.   carvedilol (COREG) 25 MG tablet Take 1.5 tablets (37.5 mg total) by mouth 2 (two) times daily.   cephALEXin (KEFLEX) 250 MG capsule Take 250 mg by mouth daily.   Cholecalciferol (VITAMIN D) 50 MCG (2000 UT) tablet Take 2,000 Units by mouth daily.   Chromium 1000 MCG TABS Take 1,000 mcg by mouth daily.    Cinnamon 500 MG capsule Take 500 mg by  mouth daily.   estradiol (ESTRACE) 0.1 MG/GM vaginal cream Place vaginally.   fluticasone (FLONASE) 50 MCG/ACT nasal spray Place 1 spray into both nostrils daily. (Patient taking differently: Place 1 spray into both nostrils as needed for allergies.)   furosemide (LASIX) 40 MG tablet Take 1 tablet (40 mg total) by mouth every morning. One tab twice daily for 1 week (Patient taking differently: Take 40 mg by mouth every morning.)   hydrALAZINE (APRESOLINE) 50 MG tablet TAKE 1 TABLET BY MOUTH  TWICE DAILY   Insulin Lispro Prot & Lispro (HUMALOG MIX 75/25 KWIKPEN) (75-25) 100 UNIT/ML Kwikpen Inject 45 Units into the skin at bedtime. (Patient taking differently: Inject 60 Units into the skin at bedtime.)   nystatin (NYSTATIN) powder Apply topically 3 (three) times daily as needed. As needed   Omega-3 Fatty Acids (FISH OIL) 500 MG CAPS Take 500 mg by mouth daily.   omeprazole (PRILOSEC) 40 MG capsule Take 40 mg by mouth daily.   pravastatin (PRAVACHOL) 40 MG tablet TAKE 1 TABLET BY MOUTH  DAILY   Probiotic Product (PROBIOTIC PO) Take by mouth.   ROCKLATAN 0.02-0.005 % SOLN SMARTSIG:1 Drop(s) In Eye(s) Every Evening   spironolactone (ALDACTONE) 25 MG tablet TAKE 1 TABLET BY MOUTH IN  THE MORNING   TRULICITY 1.5 XL/2.4MW SOPN INJECT 1.5 MG INTO THE SKIN ONCE A WEEK. (Patient taking differently: Inject 1.5 mg as directed every Wednesday.)   valsartan-hydrochlorothiazide (DIOVAN-HCT) 160-25 MG tablet TAKE 1 TABLET BY MOUTH  DAILY   No facility-administered encounter medications on file as of 01/04/2021.    [name], [facility] Pharmacist Assistant [phone]

## 2021-01-08 ENCOUNTER — Ambulatory Visit (INDEPENDENT_AMBULATORY_CARE_PROVIDER_SITE_OTHER): Payer: Medicare Other

## 2021-01-08 DIAGNOSIS — N1832 Chronic kidney disease, stage 3b: Secondary | ICD-10-CM

## 2021-01-08 DIAGNOSIS — E1122 Type 2 diabetes mellitus with diabetic chronic kidney disease: Secondary | ICD-10-CM

## 2021-01-08 DIAGNOSIS — I10 Essential (primary) hypertension: Secondary | ICD-10-CM

## 2021-01-08 NOTE — Progress Notes (Signed)
Chronic Care Management Pharmacy Note  01/10/2021 Name:  Yvonne Miller MRN:  [id number] DOB:  1950-08-30  Summary: Patient reports that she is doing well   Recommendations/Changes made from today's visit: Recommend patient continue taking her medication everyday.  Recommend patient receive influenza vaccine.   Plan: Patient is going to receive influenza vaccine.  Continue to exercise on a routine basis.    Subjective: Yvonne Miller is an 70 y.o. year old female who is a primary patient of [name], MD.  The CCM team was consulted for assistance with disease management and care coordination needs.    Engaged with patient by telephone for follow up visit in response to provider referral for pharmacy case management and/or care coordination services.   Consent to Services:  The patient was given information about Chronic Care Management services, agreed to services, and gave verbal consent prior to initiation of services.  Please see initial visit note for detailed documentation.   Patient Care Team: [name], MD as PCP - General (Internal Medicine) Riley Churches, RN as Case Manager Harlan Stains, [facility] (Pharmacist)  Recent office visits: 10/10/2020 PCP OV  Recent consult visits: 12/28/2020 Cardiology OV  12/06/2020 Gynecology OV 11/22/2020 Nutrition Visit  ED visits: 11/14/2020 - Headache    Objective:  Lab Results  Component Value Date   CREATININE 1.60 (H) 11/14/2020   BUN 25 (H) 11/14/2020   GFRNONAA 34 (L) 11/14/2020   GFRAA 45 (L) 06/21/2020   NA 135 11/14/2020   K 4.1 11/14/2020   CALCIUM 8.9 11/14/2020   CO2 27 11/14/2020   GLUCOSE 116 (H) 11/14/2020    Lab Results  Component Value Date/Time   HGBA1C 8.0 (H) 10/10/2020 10:41 AM   HGBA1C 7.7 (H) 06/21/2020 11:37 AM   MICROALBUR 30 10/10/2020 12:38 PM   MICROALBUR 30 10/05/2019 12:07 PM    Last diabetic Eye exam:  Lab Results  Component Value Date/Time   HMDIABEYEEXA No Retinopathy  12/04/2020 12:00 AM    Last diabetic Foot exam: No results found for: HMDIABFOOTEX   Lab Results  Component Value Date   CHOL 124 06/21/2020   HDL 42 06/21/2020   LDLCALC 61 06/21/2020   TRIG 117 06/21/2020   CHOLHDL 3.0 06/21/2020    Hepatic Function Latest Ref Rng & Units 10/10/2020 09/25/2020 06/21/2020  Total Protein 6.0 - 8.5 g/dL 7.6 7.7 7.3  Albumin 3.7 - 4.7 g/dL 4.5 - 4.0  AST 0 - 40 IU/L 19 - 14  ALT 0 - 32 IU/L 15 - 15  Alk Phosphatase 44 - 121 IU/L 105 - 107  Total Bilirubin 0.0 - 1.2 mg/dL 0.2 - 0.2  Bilirubin, Direct 0.00 - 0.40 mg/dL <2.21 - -    Lab Results  Component Value Date/Time   TSH 1.110 06/21/2020 11:37 AM   TSH 1.375 04/21/2009 06:57 PM    CBC Latest Ref Rng & Units 11/14/2020 06/21/2020 10/05/2019  WBC 4.0 - 10.5 K/uL 10.1 10.5 10.6  Hemoglobin 12.0 - 15.0 g/dL 41.9 28.6 80.6  Hematocrit 36.0 - 46.0 % 37.7 36.8 36.1  Platelets 150 - 400 K/uL 300 333 328    No results found for: VD25OH  Clinical ASCVD: No  The ASCVD Risk score (Arnett DK, et al., 2019) failed to calculate for the following reasons:   The valid total cholesterol range is 130 to 320 mg/dL    Depression screen [facility] 2/9 11/22/2020 06/21/2020 10/05/2019  Decreased Interest 0 0 0  Down, Depressed, Hopeless  0 0 0  PHQ - 2 Score 0 0 0  Altered sleeping - - -  Tired, decreased energy - - -  Change in appetite - - -  Feeling bad or failure about yourself  - - -  Trouble concentrating - - -  Moving slowly or fidgety/restless - - -  Suicidal thoughts - - -  PHQ-9 Score - - -  Difficult doing work/chores - - -      Social History   Tobacco Use  Smoking Status Never  Smokeless Tobacco Never   BP Readings from Last 3 Encounters:  12/28/20 132/68  11/22/20 124/76  11/14/20 124/64   Pulse Readings from Last 3 Encounters:  12/28/20 69  11/22/20 69  11/14/20 65   Wt Readings from Last 3 Encounters:  12/28/20 273 lb (123.8 kg)  11/22/20 278 lb (126.1 kg)  11/14/20 275 lb (124.7  kg)   BMI Readings from Last 3 Encounters:  12/28/20 46.86 kg/m  11/22/20 47.72 kg/m  11/14/20 47.20 kg/m    Assessment/Interventions: Review of patient past medical history, allergies, medications, health status, including review of consultants reports, laboratory and other test data, was performed as part of comprehensive evaluation and provision of chronic care management services.   SDOH:  (Social Determinants of Health) assessments and interventions performed: No  SDOH Screenings   Alcohol Screen: Not on file  Depression (PHQ2-9): Low Risk    PHQ-2 Score: 0  Financial Resource Strain: Low Risk    Difficulty of Paying Living Expenses: Not hard at all  Food Insecurity: No Food Insecurity   Worried About Charity fundraiser in the Last Year: Never true   Ran Out of Food in the Last Year: Never true  Housing: Not on file  Physical Activity: Insufficiently Active   Days of Exercise per Week: 3 days   Minutes of Exercise per Session: 30 min  Social Connections: Not on file  Stress: No Stress Concern Present   Feeling of Stress : Not at all  Tobacco Use: Low Risk    Smoking Tobacco Use: Never   Smokeless Tobacco Use: Never  Transportation Needs: No Transportation Needs   Lack of Transportation (Medical): No   Lack of Transportation (Non-Medical): No    CCM Care Plan  Allergies  Allergen Reactions   Other Other (See Comments)    NO BLOOD   . Jehovah witness    Medications Reviewed Today     Reviewed by Adrian Prows, MD (Physician) on 12/28/20 at [id number]  Med List Status: <None>   Medication Order Taking? Sig Documenting Provider Last Dose Status Informant  ACCU-CHEK AVIVA PLUS test strip [id number] Yes CHECK 4 TIMES BY INTRADERMAL ROUTE EVERY DAY [name], MD Taking Active   albuterol (PROVENTIL HFA;VENTOLIN HFA) 108 (90 Base) MCG/ACT inhaler [id number] Yes Inhale 2 puffs into the lungs every 4 (four) hours as needed for wheezing or shortness of breath.  [provider] Taking Active Self  amLODipine (NORVASC) 5 MG tablet [id number] Yes TAKE 1 TABLET BY MOUTH  DAILY Adrian Prows, MD Taking Active   aspirin EC 81 MG tablet [id number] Yes Take 81 mg by mouth at bedtime.  [provider] Taking Active Self           Med Note Michelle Nasuti   Thu Jun 16, 2019  8:38 AM)    B Complex Vitamins (B COMPLEX-B12) TABS [id number] Yes Take 1 tablet by mouth daily. [provider] Taking Active Self  Med Note Jannifer Rodney, APRIL   Thu Oct 21, 2018  8:17 AM)    carvedilol (COREG) 25 MG tablet [id number] Yes Take 1.5 tablets (37.5 mg total) by mouth 2 (two) times daily. Adrian Prows, MD Taking Active   cephALEXin (KEFLEX) 250 MG capsule [id number]  Take 250 mg by mouth daily. [provider]  Active   Cholecalciferol (VITAMIN D) 50 MCG (2000 UT) tablet [id number] Yes Take 2,000 Units by mouth daily. [provider] Taking Active   Chromium 1000 MCG TABS [id number] Yes Take 1,000 mcg by mouth daily.  [provider] Taking Active   Cinnamon 500 MG capsule [id number] Yes Take 500 mg by mouth daily. [provider] Taking Active Self           Med Note Jannifer Rodney, APRIL   Thu Oct 21, 2018  8:17 AM)    estradiol (ESTRACE) 0.1 MG/GM vaginal cream [id number] Yes Place vaginally. [provider]  Active   fluticasone (FLONASE) 50 MCG/ACT nasal spray [id number]  Place 1 spray into both nostrils daily.  Patient taking differently: Place 1 spray into both nostrils as needed for allergies.   [name], MD  Expired 11/22/20 2359   furosemide (LASIX) 40 MG tablet [id number] Yes Take 1 tablet (40 mg total) by mouth every morning. One tab twice daily for 1 week  Patient taking differently: Take 40 mg by mouth every morning.   Adrian Prows, MD Taking Active   hydrALAZINE (APRESOLINE) 50 MG tablet [id number] Yes TAKE 1 TABLET BY MOUTH  TWICE DAILY Adrian Prows, MD Taking Active   Insulin Lispro Prot & Lispro (HUMALOG  MIX 75/25 KWIKPEN) (75-25) 100 UNIT/ML Claiborne Rigg [id number] Yes Inject 45 Units into the skin at bedtime.  Patient taking differently: Inject 60 Units into the skin at bedtime.   [name], MD Taking Active   nystatin (NYSTATIN) powder [id number] Yes Apply topically 3 (three) times daily as needed. As needed [name], MD Taking Active   Omega-3 Fatty Acids (FISH OIL) 500 MG CAPS [id number] Yes Take 500 mg by mouth daily. [provider] Taking Active   omeprazole (PRILOSEC) 40 MG capsule [id number] Yes Take 40 mg by mouth daily. [provider] Taking Active   pravastatin (PRAVACHOL) 40 MG tablet [id number] Yes TAKE 1 TABLET BY MOUTH  DAILY [name], MD Taking Active   Probiotic Product (PROBIOTIC PO) [id number] Yes Take by mouth. [provider] Taking Active   ROCKLATAN 0.02-0.005 % SOLN [id number] Yes SMARTSIG:1 Drop(s) In Eye(s) Every Evening [provider] Taking Active   spironolactone (ALDACTONE) 25 MG tablet [id number] Yes TAKE 1 TABLET BY MOUTH IN  THE Alfonso Patten, MD Taking Active   TRULICITY 1.5 AY/0.4HT SOPN [id number] Yes INJECT 1.5 MG INTO THE SKIN ONCE A WEEK.  Patient taking differently: Inject 1.5 mg as directed every Wednesday.   [name], MD Taking Active   valsartan-hydrochlorothiazide (DIOVAN-HCT) 160-25 MG tablet [id number] Yes TAKE 1 TABLET BY MOUTH  DAILY [name], MD Taking Active             Patient Active Problem List   Diagnosis Date Noted   Recurrent UTI 10/10/2020   Class 3 severe obesity due to excess calories with serious comorbidity and body mass index (BMI) of 45.0 to 49.9 in adult [facility]) 10/10/2020   Non-ischemic cardiomyopathy ([place]) 02/18/2019   Encounter for assessment of implantable cardioverter-defibrillator (ICD)    Type 2 diabetes mellitus with stage 3 chronic kidney disease, with long-term current use of  insulin ([place]) 03/04/2018   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   Chronic  combined systolic and diastolic congestive heart failure ([place]) 03/04/2018   Chronic renal disease, stage II 03/04/2018   Status post revision of total replacement of left knee 01/26/2018   Unilateral primary osteoarthritis, left knee 08/18/2017   Biventricular automatic implantable cardioverter defibrillator in situ 12/13/2013   ICD -  Biventricular  [employer] Dynagen X4 CRT-D Model G158 ICD in place 10/28/13 10/28/2013   HTN (hypertension) 10/03/2013   Hyperlipemia 10/03/2013   LBBB (left bundle branch block) 10/03/2013   Cardiomyopathy ([place]) 10/03/2013   DM (diabetes mellitus) ([place]) 10/03/2013    Immunization History  Administered Date(s) Administered   Fluad Quad(high Dose 65+) 02/07/2020   Hepatitis A 01/19/2012, 02/16/2012   Hepatitis B 01/19/2012, 02/16/2012   IPV 02/16/2012   Influenza, High Dose Seasonal PF 02/17/2016, 03/04/2018, 02/10/2019   Influenza-Unspecified 02/02/2014, 02/16/2014   MMR 04/04/1994   PFIZER(Purple Top)SARS-COV-2 Vaccination 06/11/2019, 07/02/2019, 02/21/2020   Pneumococcal Conjugate-13 05/16/2020   Td 09/08/2006   Tdap 01/19/2012, 06/21/2020   Zoster, Live 12/15/2007    Conditions to be addressed/monitored:  Hypertension and Diabetes  Care Plan : [place]  Updates made by [name], [place] since 01/10/2021 12:00 AM     Problem: HTN, DM II   Priority: High     Long-Range Goal: Disease Management   Start Date: 07/12/2020  Recent Progress: On track  Priority: High  Note:   Current Barriers:  Unable to independently monitor therapeutic efficacy  Pharmacist Clinical Goal(s):  Patient will achieve adherence to monitoring guidelines and medication adherence to achieve therapeutic efficacy through collaboration with PharmD and provider.   Interventions: 1:1 collaboration with [name], MD regarding development and update of comprehensive plan of care as evidenced by provider attestation and  co-signature Inter-disciplinary care team collaboration (see longitudinal plan of care) Comprehensive medication review performed; medication list updated in electronic medical record  Hypertension (BP goal <130/80) -Controlled -Current treatment: Carvedilol 37.5 mg taking 1 tablet twice per week.  Valsartan-Hydrochlorothiazide 160-25 mg taking 1 tablet by mouth daily -Medications previously tried: Hydralazine 50 mg tablet, Olmesartan - Hydrochlorothiazide 40-25 mg,   -Current home readings: will ask during next office visit  -Current dietary habits: limiting the amount of salt she uses on her food.  -Current exercise habits: -Current exercise: exercising at least three days per week, Monday, Tuesday and Thursday and Friday for at least 30 minutes per day.  -Denies hypotensive/hypertensive symptoms -Educated on Daily salt intake goal < 2300 mg; Exercise goal of 150 minutes per week; -Counseled to monitor BP at home at least once per week, document, and provide log at future appointments -Recommended to continue current medication  Diabetes (A1c goal <8%) -Not ideally controlled -Current medications: Humalog Mix 75/25 - inject 60 units nightly  Trulicity 1.5 mg - inject once a week on Wednesday  -Current home glucose readings fasting glucose: 60, 70 in the past two weeks  post prandial glucose: 140 -Denies hypoglycemic/hyperglycemic symptoms -Current meal patterns: eating more plant based noodles and decreased the amount of carbohydrates and adding more plant based meals. She kept a food log for two weeks and reported  drinks: plenty of water  -Current exercise: exercising at least three days per week, Monday, Tuesday and Thursday and Friday for at least 30 minutes per day.  -Educated on A1c and blood sugar goals; Prevention and management of hypoglycemic episodes; -Counseled to check feet daily and get yearly  eye exams -Recommended to continue current medication -Collaborate with  PCP to determine changes that might need to be made with low blood sugar readings.   Patient Goals/Self-Care Activities Patient will:  - take medications as prescribed  Follow Up Plan: The patient has been provided with contact information for the care management team and has been advised to call with any health related questions or concerns.       Medication Assistance:  Trulicity obtained through [employer] medication assistance program.  Enrollment ends 04/2021  Compliance/Adherence/Medication fill history: Care Gaps: Shingrix Vaccine COVID-19 Booster Influenza Vaccine   Star-Rating Drugs: Pravastatin 40 mg tablet Trulicity 1.5 mg  Valsartan-Hydrochlorothiazide 160-25 mg tablet  Patient's preferred pharmacy is:  [facility]) [place] [place] [address] Phone: [phone] Fax: [phone]  [id number] - [place], [place] - [address] [place] [address] Phone: [phone] Fax: [phone]  Uses pill box? Yes Pt endorses 95% compliance  We discussed: Benefits of medication synchronization, packaging and delivery as well as enhanced pharmacist oversight with [employer]. Patient decided to: Continue current medication management strategy  Care Plan and Follow Up Patient Decision:  Patient agrees to Care Plan and Follow-up.  Plan: The patient has been provided with contact information for the care management team and has been advised to call with any health related questions or concerns.   [name], PharmD Clinical Pharmacist [facility] [phone]

## 2021-01-10 NOTE — Patient Instructions (Signed)
Visit Information It was great speaking with you today!  Please let me know if you have any questions about our visit.   Goals Addressed             This Visit's Progress    Monitor and Manage My Blood Sugar-Diabetes Type 2       Timeframe:  Long-Range Goal Priority:  High Start Date:                             Expected End Date:                       Follow Up Date 02/14/2021  In Progress:   - check blood sugar at prescribed times - take the blood sugar log to all doctor visits - take the blood sugar meter to all doctor visits    Why is this important?   Checking your blood sugar at home helps to keep it from getting very high or very low.  Writing the results in a diary or log helps the doctor know how to care for you.  Your blood sugar log should have the time, date and the results.  Also, write down the amount of insulin or other medicine that you take.  Other information, like what you ate, exercise done and how you were feeling, will also be helpful.             Patient Care Plan: CCM Pharmacy Care Plan     Problem Identified: HTN, DM II   Priority: High     Long-Range Goal: Disease Management   Start Date: 07/12/2020  Recent Progress: On track  Priority: High  Note:   Current Barriers:  Unable to independently monitor therapeutic efficacy  Pharmacist Clinical Goal(s):  Patient will achieve adherence to monitoring guidelines and medication adherence to achieve therapeutic efficacy through collaboration with PharmD and provider.   Interventions: 1:1 collaboration with [name], MD regarding development and update of comprehensive plan of care as evidenced by provider attestation and co-signature Inter-disciplinary care team collaboration (see longitudinal plan of care) Comprehensive medication review performed; medication list updated in electronic medical record  Hypertension (BP goal <130/80) -Controlled -Current treatment: Carvedilol 37.5  mg taking 1 tablet twice per week.  Valsartan-Hydrochlorothiazide 160-25 mg taking 1 tablet by mouth daily -Medications previously tried: Hydralazine 50 mg tablet, Olmesartan - Hydrochlorothiazide 40-25 mg,   -Current home readings: will ask during next office visit  -Current dietary habits: limiting the amount of salt she uses on her food.  -Current exercise habits: -Current exercise: exercising at least three days per week, Monday, Tuesday and Thursday and Friday for at least 30 minutes per day.  -Denies hypotensive/hypertensive symptoms -Educated on Daily salt intake goal < 2300 mg; Exercise goal of 150 minutes per week; -Counseled to monitor BP at home at least once per week, document, and provide log at future appointments -Recommended to continue current medication  Diabetes (A1c goal <8%) -Not ideally controlled -Current medications: Humalog Mix 75/25 - inject 60 units nightly  Trulicity 1.5 mg - inject once a week on Wednesday  -Current home glucose readings fasting glucose: 60, 70 in the past two weeks  post prandial glucose: 140 -Denies hypoglycemic/hyperglycemic symptoms -Current meal patterns: eating more plant based noodles and decreased the amount of carbohydrates and adding more plant based meals. She kept a food log for two weeks and reported  drinks: plenty of  water  -Current exercise: exercising at least three days per week, Monday, Tuesday and Thursday and Friday for at least 30 minutes per day.  -Educated on A1c and blood sugar goals; Prevention and management of hypoglycemic episodes; -Counseled to check feet daily and get yearly eye exams -Recommended to continue current medication -Collaborate with PCP to determine changes that might need to be made with low blood sugar readings.   Patient Goals/Self-Care Activities Patient will:  - take medications as prescribed  Follow Up Plan: The patient has been provided with contact information for the care management  team and has been advised to call with any health related questions or concerns.        Patient agreed to services and verbal consent obtained.   The patient verbalized understanding of instructions, educational materials, and care plan provided today and agreed to receive a mailed copy of patient instructions, educational materials, and care plan.   [name], PharmD Clinical Pharmacist [facility] [phone]

## 2021-01-14 ENCOUNTER — Telehealth (INDEPENDENT_AMBULATORY_CARE_PROVIDER_SITE_OTHER): Payer: Medicare Other | Admitting: Nurse Practitioner

## 2021-01-14 ENCOUNTER — Encounter: Payer: Self-pay | Admitting: Nurse Practitioner

## 2021-01-14 VITALS — BP 117/78 | Temp 97.0°F

## 2021-01-14 DIAGNOSIS — R059 Cough, unspecified: Secondary | ICD-10-CM | POA: Diagnosis not present

## 2021-01-14 DIAGNOSIS — U071 COVID-19: Secondary | ICD-10-CM | POA: Diagnosis not present

## 2021-01-14 MED ORDER — HYDROCODONE BIT-HOMATROP MBR 5-1.5 MG/5ML PO SOLN: 5.0000 mL | Freq: Four times a day (QID) | ORAL | 0 refills | Status: DC | PRN

## 2021-01-14 MED ORDER — AZITHROMYCIN 250 MG PO TABS: ORAL_TABLET | ORAL | 0 refills | Status: AC

## 2021-01-14 NOTE — Progress Notes (Addendum)
Virtual Visit via MyChart   This visit type was conducted due to national recommendations for restrictions regarding the COVID-19 Pandemic (e.g. social distancing) in an effort to limit this patient's exposure and mitigate transmission in our community.  Due to her co-morbid illnesses, this patient is at least at moderate risk for complications without adequate follow up.  This format is felt to be most appropriate for this patient at this time.  All issues noted in this document were discussed and addressed.  A limited physical exam was performed with this format.    This visit type was conducted due to national recommendations for restrictions regarding the COVID-19 Pandemic (e.g. social distancing) in an effort to limit this patient's exposure and mitigate transmission in our community.  Patients identity confirmed using two different identifiers.  This format is felt to be most appropriate for this patient at this time.  All issues noted in this document were discussed and addressed.  No physical exam was performed (except for noted visual exam findings with Video Visits).    Date:  01/14/2021   ID:  Yvonne Miller, DOB [birth year], MRN [id number]  Patient Location:  [place]  Provider location:   Office    Chief Complaint:  positive for covid  History of Present Illness:    Yvonne Miller is a 70 y.o. female who presents via video conferencing for a telehealth visit today.    The patient does have symptoms concerning for COVID-19 infection (fever, chills, cough, or new shortness of breath).   Patient tested postive on 01/14/21, started with chills, coughing, fatigue and headache, took Tylenol and Nyquil on Saturday night.  Today has had bad cough without fever. Yellow secretions.  Blood sugars was 89 She has been eating chicken noodle soup, drinking hot tea and water.     Past Medical History:  Diagnosis Date   AICD (automatic cardioverter/defibrillator) present     Arthritis    Asthma    Carpal tunnel syndrome, bilateral    CKD (chronic kidney disease)    Diabetes mellitus    Encounter for assessment of implantable cardioverter-defibrillator (ICD)    GERD (gastroesophageal reflux disease)    Hyperlipemia    Hypertension    Obesity    morbid   Sleep apnea    wears CPAP set at 12   Wears glasses    Past Surgical History:  Procedure Laterality Date   ABDOMINAL HYSTERECTOMY     BIOPSY  03/01/2019   Procedure: BIOPSY;  Surgeon: Juanita Craver, MD;  Location: [facility];  Service: Endoscopy;;   BREAST SURGERY  1968   breast mass excision   CARPAL TUNNEL RELEASE Left 07/10/2017   Procedure: CARPAL TUNNEL RELEASE;  Surgeon: Ashok Pall, MD;  Location: [place];  Service: Neurosurgery;  Laterality: Left;  left   CARPAL TUNNEL RELEASE Right 03/19/2018   Procedure: RIGHT CARPAL TUNNEL RELEASE;  Surgeon: Ashok Pall, MD;  Location: [place];  Service: Neurosurgery;  Laterality: Right;  right   CERVICAL SPINE SURGERY  2006   CHOLECYSTECTOMY     COLONOSCOPY WITH PROPOFOL N/A 03/01/2019   Procedure: COLONOSCOPY WITH PROPOFOL;  Surgeon: Juanita Craver, MD;  Location: [facility];  Service: Endoscopy;  Laterality: N/A;   DILATION AND CURETTAGE OF UTERUS     ICD IMPLANT     JOINT REPLACEMENT     TONSILLECTOMY  1961   TOTAL KNEE ARTHROPLASTY Left 01/26/2018   Procedure: LEFT TOTAL KNEE ARTHROPLASTY;  Surgeon: Mcarthur Rossetti,  MD;  Location: [place];  Service: Orthopedics;  Laterality: Left;     Current Meds  Medication Sig   azithromycin (ZITHROMAX) 250 MG tablet Take 2 tablets (500 mg) on  Day 1,  followed by 1 tablet (250 mg) once daily on Days 2 through 5.   HYDROcodone bit-homatropine (HYDROMET) 5-1.5 MG/5ML syrup Take 5 mLs by mouth every 6 (six) hours as needed for cough.     Allergies:   Other   Social History   Tobacco Use   Smoking status: Never   Smokeless tobacco: Never  Vaping Use   Vaping Use: Never used  Substance Use Topics    Alcohol use: No   Drug use: No     Family Hx: The patient's family history includes Heart attack in her brother and sister; Heart disease in her father; Hypertension in her mother; Stroke in her mother.  ROS:   Please see the history of present illness.    Review of Systems  Constitutional: Negative.   HENT:  Positive for congestion.   Respiratory:  Positive for cough.   Cardiovascular: Negative.    All other systems reviewed and are negative.   Labs/Other Tests and Data Reviewed:    Recent Labs: 06/21/2020: TSH 1.110 09/14/2020: BNP 28.6; Magnesium 2.3 10/10/2020: ALT 15 11/14/2020: BUN 25; Creatinine, Ser 1.60; Hemoglobin 12.0; Platelets 300; Potassium 4.1; Sodium 135   Recent Lipid Panel Lab Results  Component Value Date/Time   CHOL 124 06/21/2020 11:37 AM   TRIG 117 06/21/2020 11:37 AM   HDL 42 06/21/2020 11:37 AM   CHOLHDL 3.0 06/21/2020 11:37 AM   CHOLHDL 3.8 04/22/2009 06:03 AM   LDLCALC 61 06/21/2020 11:37 AM    Wt Readings from Last 3 Encounters:  12/28/20 273 lb (123.8 kg)  11/22/20 278 lb (126.1 kg)  11/14/20 275 lb (124.7 kg)     Exam:    Vital Signs:  BP 117/78   Temp (!) 97 F (36.1 C) (Oral)     Physical Exam Vitals reviewed.  Constitutional:      General: She is not in acute distress.    Appearance: Normal appearance. She is obese.  Pulmonary:     Effort: Pulmonary effort is normal. No respiratory distress.     Breath sounds: No wheezing.  Neurological:     General: No focal deficit present.     Mental Status: She is alert and oriented to person, place, and time.     Cranial Nerves: No cranial nerve deficit.     Motor: No weakness.    ASSESSMENT & PLAN:    1. COVID-19 She is positive for covid this afternoon, will send Rx for MAB infusion Advised patient to take Vitamin C, D, Zinc.  Keep yourself hydrated with a lot of water and rest. Take Delsym for cough and Mucinex as need. Take Tylenol or pain reliever every 4-6 hours as needed for  pain/fever/body ache. If you have elevated blood pressure, you can take OTC Corcidin. Educated patient if symptoms get worse or if she experiences any SOB, chest pain or pain in her legs to seek immediate emergency care. Continue to monitor your oxygen levels. Call us if you have any questions. Quarantine for 5 days if tested positive and no symptoms or 10 days if tested positive and have symptoms. Wear a mask around other people.  - bebtelovimab EUA injection SOLN 175 mg - 0.9 %  sodium chloride infusion - Hypersensitivity GRADE 1: Transient flushing or rash, or drug fever <  100.4 F - Hypersensitivity GRADE 2: Rash, flushing, urticaria, dyspnea, or drug fever = or > 100.4 F - Hypersensitivity GRADE 3: symptomatic bronchospasm, with or without urticaria, parenteral medication management indicated, allergy-related edema/angioedema, or hypotension - Hypersensitivity GRADE 4: Anaphylaxis - diphenhydrAMINE (BENADRYL) injection 50 mg - famotidine (PEPCID) 20 mg in sodium chloride 0.9 % 50 mL IVPB - methylPREDNISolone sodium succinate (SOLU-MEDROL) 130 mg in sodium chloride 0.9 % 50 mL IVPB - albuterol (VENTOLIN HFA) 108 (90 Base) MCG/ACT inhaler 2 puff - EPINEPHrine (EPI-PEN) injection 0.3 mg  2. Cough Will treat with antibiotic and given cough syrup as needed  - azithromycin (ZITHROMAX) 250 MG tablet; Take 2 tablets (500 mg) on  Day 1,  followed by 1 tablet (250 mg) once daily on Days 2 through 5.  Dispense: 6 each; Refill: 0 - HYDROcodone bit-homatropine (HYDROMET) 5-1.5 MG/5ML syrup; Take 5 mLs by mouth every 6 (six) hours as needed for cough.  Dispense: 120 mL; Refill: 0   COVID-19 Education: The signs and symptoms of COVID-19 were discussed with the patient and how to seek care for testing (follow up with PCP or arrange E-visit).  The importance of social distancing was discussed today.  Patient Risk:   After full review of this patients clinical status, I feel that they are at least moderate  risk at this time.  Time:   Today, I have spent 10.75 minutes/ seconds with the patient with telehealth technology discussing above diagnoses.     Medication Adjustments/Labs and Tests Ordered: Current medicines are reviewed at length with the patient today.  Concerns regarding medicines are outlined above.   Tests Ordered: No orders of the defined types were placed in this encounter.   Medication Changes: Meds ordered this encounter  Medications   azithromycin (ZITHROMAX) 250 MG tablet    Sig: Take 2 tablets (500 mg) on  Day 1,  followed by 1 tablet (250 mg) once daily on Days 2 through 5.    Dispense:  6 each    Refill:  0   HYDROcodone bit-homatropine (HYDROMET) 5-1.5 MG/5ML syrup    Sig: Take 5 mLs by mouth every 6 (six) hours as needed for cough.    Dispense:  120 mL    Refill:  0    Disposition:  Follow up prn  Signed, Minette Brine, FNP

## 2021-01-14 NOTE — Patient Instructions (Signed)

## 2021-01-15 ENCOUNTER — Other Ambulatory Visit: Payer: Self-pay | Admitting: Nurse Practitioner

## 2021-01-15 ENCOUNTER — Ambulatory Visit: Payer: Medicare Other

## 2021-01-15 MED ORDER — PROMETHAZINE-DM 6.25-15 MG/5ML PO SYRP: 5.0000 mL | ORAL_SOLUTION | Freq: Four times a day (QID) | ORAL | 0 refills | Status: DC | PRN

## 2021-01-16 ENCOUNTER — Ambulatory Visit (INDEPENDENT_AMBULATORY_CARE_PROVIDER_SITE_OTHER): Payer: Medicare Other

## 2021-01-16 DIAGNOSIS — U071 COVID-19: Secondary | ICD-10-CM | POA: Diagnosis not present

## 2021-01-16 MED ORDER — DIPHENHYDRAMINE HCL 50 MG/ML IJ SOLN: 50.0000 mg | Freq: Once | INTRAMUSCULAR | Status: AC | PRN

## 2021-01-16 MED ORDER — ALBUTEROL SULFATE HFA 108 (90 BASE) MCG/ACT IN AERS: 2.0000 | INHALATION_SPRAY | Freq: Once | RESPIRATORY_TRACT | Status: AC | PRN

## 2021-01-16 MED ORDER — SODIUM CHLORIDE 0.9 % IV SOLN: INTRAVENOUS | Status: DC | PRN

## 2021-01-16 MED ORDER — METHYLPREDNISOLONE SODIUM SUCC 125 MG IJ SOLR
125.0000 mg | Freq: Once | INTRAMUSCULAR | Status: AC | PRN
Start: 2021-01-16 — End: 2021-01-16

## 2021-01-16 MED ORDER — EPINEPHRINE 0.3 MG/0.3ML IJ SOAJ: 0.3000 mg | Freq: Once | INTRAMUSCULAR | Status: AC | PRN

## 2021-01-16 MED ORDER — FAMOTIDINE IN NACL 20-0.9 MG/50ML-% IV SOLN
20.0000 mg | Freq: Once | INTRAVENOUS | Status: AC | PRN
Start: 2021-01-16 — End: 2021-01-16

## 2021-01-16 MED ORDER — BEBTELOVIMAB 175 MG/2 ML IV (EUA)
175.0000 mg | Freq: Once | INTRAMUSCULAR | Status: AC
Start: 2021-01-16 — End: 2021-01-16
  Administered 2021-01-16: 175 mg via INTRAVENOUS

## 2021-01-16 NOTE — Progress Notes (Signed)
Diagnosis: COVID  Provider:  Marshell Garfinkel, MD  Procedure: Infusion  IV Type: Peripheral, IV Location: L Antecubital  Bebtelovimab, Dose: 175 mg  Infusion Start Time: 15.38 01/16/2021  Infusion Stop Time: 15.39 01/16/2021  Post Infusion IV Care: 30 minutes Observation period completed and Peripheral IV Discontinued.  Discharge: Condition: Good, Destination: Home . AVS provided to patient.   Performed by:  Arnoldo Morale, RN

## 2021-02-01 DIAGNOSIS — E1122 Type 2 diabetes mellitus with diabetic chronic kidney disease: Secondary | ICD-10-CM

## 2021-02-01 DIAGNOSIS — Z794 Long term (current) use of insulin: Secondary | ICD-10-CM

## 2021-02-01 DIAGNOSIS — I10 Essential (primary) hypertension: Secondary | ICD-10-CM

## 2021-02-01 DIAGNOSIS — N1832 Chronic kidney disease, stage 3b: Secondary | ICD-10-CM

## 2021-02-12 ENCOUNTER — Ambulatory Visit (INDEPENDENT_AMBULATORY_CARE_PROVIDER_SITE_OTHER): Payer: Medicare Other | Admitting: Internal Medicine

## 2021-02-12 ENCOUNTER — Other Ambulatory Visit: Payer: Self-pay

## 2021-02-12 ENCOUNTER — Encounter: Payer: Self-pay | Admitting: Internal Medicine

## 2021-02-12 VITALS — BP 134/72 | HR 80 | Temp 98.0°F | Ht 62.8 in | Wt 271.6 lb

## 2021-02-12 DIAGNOSIS — E559 Vitamin D deficiency, unspecified: Secondary | ICD-10-CM | POA: Diagnosis not present

## 2021-02-12 DIAGNOSIS — Z794 Long term (current) use of insulin: Secondary | ICD-10-CM

## 2021-02-12 DIAGNOSIS — E1122 Type 2 diabetes mellitus with diabetic chronic kidney disease: Secondary | ICD-10-CM

## 2021-02-12 DIAGNOSIS — N1832 Chronic kidney disease, stage 3b: Secondary | ICD-10-CM

## 2021-02-12 DIAGNOSIS — I130 Hypertensive heart and chronic kidney disease with heart failure and stage 1 through stage 4 chronic kidney disease, or unspecified chronic kidney disease: Secondary | ICD-10-CM | POA: Diagnosis not present

## 2021-02-12 DIAGNOSIS — Z23 Encounter for immunization: Secondary | ICD-10-CM

## 2021-02-12 DIAGNOSIS — E278 Other specified disorders of adrenal gland: Secondary | ICD-10-CM | POA: Insufficient documentation

## 2021-02-12 DIAGNOSIS — Z8616 Personal history of COVID-19: Secondary | ICD-10-CM | POA: Diagnosis not present

## 2021-02-12 DIAGNOSIS — Z6841 Body Mass Index (BMI) 40.0 and over, adult: Secondary | ICD-10-CM

## 2021-02-12 DIAGNOSIS — R911 Solitary pulmonary nodule: Secondary | ICD-10-CM

## 2021-02-12 DIAGNOSIS — I5032 Chronic diastolic (congestive) heart failure: Secondary | ICD-10-CM

## 2021-02-12 LAB — POCT URINALYSIS DIPSTICK
Bilirubin, UA: NEGATIVE
Blood, UA: NEGATIVE
Glucose, UA: NEGATIVE
Ketones, UA: NEGATIVE
Leukocytes, UA: NEGATIVE
Nitrite, UA: NEGATIVE
Protein, UA: NEGATIVE
Spec Grav, UA: 1.02 (ref 1.010–1.025)
Urobilinogen, UA: 0.2 E.U./dL
pH, UA: 7.5 (ref 5.0–8.0)

## 2021-02-12 MED ORDER — ZOSTER VAC RECOMB ADJUVANTED 50 MCG/0.5ML IM SUSR: 0.5000 mL | Freq: Once | INTRAMUSCULAR | 1 refills | Status: AC

## 2021-02-12 NOTE — Progress Notes (Signed)
I,Tianna Badgett,acting as a Education administrator for Maximino Greenland, MD.,have documented all relevant documentation on the behalf of Maximino Greenland, MD,as directed by  Maximino Greenland, MD while in the presence of Maximino Greenland, MD.  This visit occurred during the SARS-CoV-2 public health emergency.  Safety protocols were in place, including screening questions prior to the visit, additional usage of staff PPE, and extensive cleaning of exam room while observing appropriate contact time as indicated for disinfecting solutions.  Subjective:     Patient ID: Yvonne Miller , female    DOB: 1950/12/01 , 70 y.o.   MRN: [id number]   Chief Complaint  Patient presents with   Diabetes   Hypertension    HPI  She is here today for a HTN and DM follow up.  She reports compliance with meds. States she feels much better, she has made some dietary changes. She is no longer having issues with diarrhea. She states her sugars have improved as well. No new concerns today.   Diabetes She presents for her follow-up diabetic visit. She has type 2 diabetes mellitus. Her disease course has been stable. There are no hypoglycemic associated symptoms. Pertinent negatives for diabetes include no blurred vision and no chest pain. There are no hypoglycemic complications. Diabetic complications include nephropathy. Risk factors for coronary artery disease include diabetes mellitus, dyslipidemia, hypertension, obesity and sedentary lifestyle. She is compliant with treatment some of the time. She is following a generally healthy diet. She participates in exercise intermittently. Her breakfast blood glucose is taken between 8-9 am. Her breakfast blood glucose range is generally 110-130 mg/dl. An ACE inhibitor/angiotensin II receptor blocker is being taken.  Hypertension This is a chronic problem. The current episode started more than 1 year ago. The problem has been gradually improving since onset. The problem is controlled. Pertinent  negatives include no blurred vision, chest pain, palpitations or shortness of breath. Past treatments include ACE inhibitors, angiotensin blockers and diuretics. Identifiable causes of hypertension include sleep apnea.    Past Medical History:  Diagnosis Date   AICD (automatic cardioverter/defibrillator) present    Arthritis    Asthma    Carpal tunnel syndrome, bilateral    CKD (chronic kidney disease)    Diabetes mellitus    Encounter for assessment of implantable cardioverter-defibrillator (ICD)    GERD (gastroesophageal reflux disease)    Hyperlipemia    Hypertension    Obesity    morbid   Sleep apnea    wears CPAP set at 12   Wears glasses      Family History  Problem Relation Age of Onset   Hypertension Mother    Stroke Mother    Heart disease Father    Heart attack Sister    Heart attack Brother      Current Outpatient Medications:    Zoster Vaccine Adjuvanted [facility]) injection, Inject 0.5 mLs into the muscle once for 1 dose. Administer 2nd dose in 2-6 months Please fax when each dose administered [phone], Disp: 0.5 mL, Rfl: 1   ACCU-CHEK AVIVA PLUS test strip, CHECK 4 TIMES BY INTRADERMAL ROUTE EVERY DAY, Disp: 200 strip, Rfl: 11   albuterol (PROVENTIL HFA;VENTOLIN HFA) 108 (90 Base) MCG/ACT inhaler, Inhale 2 puffs into the lungs every 4 (four) hours as needed for wheezing or shortness of breath. , Disp: , Rfl:    amLODipine (NORVASC) 5 MG tablet, TAKE 1 TABLET BY MOUTH  DAILY, Disp: 90 tablet, Rfl: 3   aspirin EC 81 MG tablet, Take  81 mg by mouth at bedtime. , Disp: , Rfl:    B Complex Vitamins (B COMPLEX-B12) TABS, Take 1 tablet by mouth daily., Disp: , Rfl:    carvedilol (COREG) 25 MG tablet, Take 1.5 tablets (37.5 mg total) by mouth 2 (two) times daily., Disp: 270 tablet, Rfl: 3   cephALEXin (KEFLEX) 250 MG capsule, Take 250 mg by mouth daily., Disp: , Rfl:    Cholecalciferol (VITAMIN D) 50 MCG (2000 UT) tablet, Take 2,000 Units by mouth daily., Disp: , Rfl:     Chromium 1000 MCG TABS, Take 1,000 mcg by mouth daily. , Disp: , Rfl:    Cinnamon 500 MG capsule, Take 500 mg by mouth daily., Disp: , Rfl:    estradiol (ESTRACE) 0.1 MG/GM vaginal cream, Place vaginally., Disp: , Rfl:    fluticasone (FLONASE) 50 MCG/ACT nasal spray, Place 1 spray into both nostrils daily. (Patient taking differently: Place 1 spray into both nostrils as needed for allergies.), Disp: 16 g, Rfl: 2   furosemide (LASIX) 40 MG tablet, Take 1 tablet (40 mg total) by mouth every morning. One tab twice daily for 1 week (Patient taking differently: Take 40 mg by mouth every morning.), Disp: 60 tablet, Rfl: 2   hydrALAZINE (APRESOLINE) 50 MG tablet, TAKE 1 TABLET BY MOUTH  TWICE DAILY, Disp: 180 tablet, Rfl: 3   Insulin Lispro Prot & Lispro (HUMALOG MIX 75/25 KWIKPEN) (75-25) 100 UNIT/ML Kwikpen, Inject 45 Units into the skin at bedtime. (Patient taking differently: Inject 60 Units into the skin at bedtime.), Disp: 45 mL, Rfl: 3   nystatin (NYSTATIN) powder, Apply topically 3 (three) times daily as needed. As needed, Disp: 60 g, Rfl: 2   Omega-3 Fatty Acids (FISH OIL) 500 MG CAPS, Take 500 mg by mouth daily., Disp: , Rfl:    omeprazole (PRILOSEC) 40 MG capsule, Take 40 mg by mouth daily., Disp: , Rfl:    pravastatin (PRAVACHOL) 40 MG tablet, TAKE 1 TABLET BY MOUTH  DAILY, Disp: 90 tablet, Rfl: 3   Probiotic Product (PROBIOTIC PO), Take by mouth., Disp: , Rfl:    ROCKLATAN 0.02-0.005 % SOLN, SMARTSIG:1 Drop(s) In Eye(s) Every Evening, Disp: , Rfl:    spironolactone (ALDACTONE) 25 MG tablet, TAKE 1 TABLET BY MOUTH IN  THE MORNING, Disp: 90 tablet, Rfl: 3   TRULICITY 1.5 BH/4.1PF SOPN, INJECT 1.5 MG INTO THE SKIN ONCE A WEEK. (Patient taking differently: Inject 2 mg as directed every Wednesday.), Disp: 2 pen, Rfl: 2   valsartan-hydrochlorothiazide (DIOVAN-HCT) 160-25 MG tablet, TAKE 1 TABLET BY MOUTH  DAILY, Disp: 90 tablet, Rfl: 3  Current Facility-Administered Medications:    0.9 %   sodium chloride infusion, , Intravenous, PRN, Minette Brine, FNP   Allergies  Allergen Reactions   Other Other (See Comments)    NO BLOOD   . Jehovah witness     Review of Systems  Constitutional: Negative.   Eyes:  Negative for blurred vision.  Respiratory: Negative.  Negative for shortness of breath.   Cardiovascular: Negative.  Negative for chest pain and palpitations.  Gastrointestinal: Negative.   Neurological: Negative.     Today's Vitals   02/12/21 0842  BP: 134/72  Pulse: 80  Temp: 98 F (36.7 C)  TempSrc: Oral  Weight: 271 lb 9.6 oz (123.2 kg)  Height: 5' 2.8" (1.595 m)   Body mass index is 48.42 kg/m.  Wt Readings from Last 3 Encounters:  02/12/21 271 lb 9.6 oz (123.2 kg)  12/28/20 273 lb (123.8 kg)  11/22/20 278 lb (126.1 kg)    Objective:  Physical Exam Vitals and nursing note reviewed.  Constitutional:      Appearance: Normal appearance. She is obese.  HENT:     Head: Normocephalic and atraumatic.     Nose:     Comments: Masked     Mouth/Throat:     Comments: Masked  Eyes:     Extraocular Movements: Extraocular movements intact.  Cardiovascular:     Rate and Rhythm: Normal rate and regular rhythm.     Heart sounds: Normal heart sounds.  Pulmonary:     Effort: Pulmonary effort is normal.     Breath sounds: Normal breath sounds.  Musculoskeletal:     Cervical back: Normal range of motion.  Skin:    General: Skin is warm.  Neurological:     General: No focal deficit present.     Mental Status: She is alert.  Psychiatric:        Mood and Affect: Mood normal.        Behavior: Behavior normal.        Assessment And Plan:     1. Type 2 diabetes mellitus with stage 3b chronic kidney disease, with long-term current use of insulin (HCC) Comments: Chronic, I will check labs as listed below. I will adjust meds as needed.  She will rto in 4 months for re-evaluation. Congratulated on lifestyle changes. - Hemoglobin A1c - CMP14+EGFR - Lipid  panel  2. Hypertensive heart and renal disease with renal failure, stage 1 through stage 4 or unspecified chronic kidney disease, with heart failure (HCC) Comments: Chronic, fair control. Goal BP is less than 130/80. She is encouraged to follow low sodium diet.  - POCT Urinalysis Dipstick (81002)  3. Chronic diastolic heart failure (HCC) Comments: Chronic, importance of dietary compliance was d/w pt. Importance of living heart healthy lifestyle was d/w patient.   4. Vitamin D deficiency Comments: I will check vitamin D level and supplement as needed.  - Vitamin D (25 hydroxy)  5. Lung nodule Comments: It is less than 71mm. Pt is considered low risk. No need for f/u CT scan.   6. Adrenal nodule (Protivin) Comments: Seen on CT in 2021, defined as a benign nodule.   7. Class 3 severe obesity due to excess calories with serious comorbidity and body mass index (BMI) of 45.0 to 49.9 in adult [facility]) Comments: BMI 48. She is encouraged to strive for BMI less than 40 to decrease cardiac risk. Advised to aim for at least 150 minutes of exercise per week.   8. Need for influenza vaccination Comments: She was given high dose flu vaccine.  I will also send rx Shingrix to her local pharmacy.  - Flu Vaccine QUAD High Dose(Fluad)  9. Personal history of COVID-19 Comments: She is fully vaccinated and boosted x 1.  Advised to get booster 90 days after antibody infusion to get 2nd COVID booster.   Patient was given opportunity to ask questions. Patient verbalized understanding of the plan and was able to repeat key elements of the plan. All questions were answered to their satisfaction.   I, Maximino Greenland, MD, have reviewed all documentation for this visit. The documentation on 02/12/21 for the exam, diagnosis, procedures, and orders are all accurate and complete.   IF YOU HAVE BEEN REFERRED TO A SPECIALIST, IT MAY TAKE 1-2 WEEKS TO SCHEDULE/PROCESS THE REFERRAL. IF YOU HAVE NOT HEARD FROM US/SPECIALIST IN  TWO WEEKS, PLEASE GIVE [place] A CALL AT  [phone] X [phone].   THE PATIENT IS ENCOURAGED TO PRACTICE SOCIAL DISTANCING DUE TO THE COVID-19 PANDEMIC.    

## 2021-02-12 NOTE — Patient Instructions (Signed)
Vitamin D Deficiency Vitamin D deficiency is when your body does not have enough vitamin D. Vitamin D is important to your body because: It helps your body use other minerals. It helps to keep your bones strong and healthy. It may help to prevent some diseases. It helps your heart and other muscles work well. Not getting enough vitamin D can make your bones soft. It can also cause other health problems. What are the causes? This condition may be caused by: Not eating enough foods that contain vitamin D. Not getting enough sun. Having diseases that make it hard for your body to absorb vitamin D. Having a surgery in which a part of the stomach or a part of the small intestine is removed. Having kidney disease or liver disease. What increases the risk? You are more likely to get this condition if: You are older. You do not spend much time outdoors. You live in a nursing home. You have had broken bones. You have weak or thin bones (osteoporosis). You have a disease or condition that changes how your body absorbs vitamin D. You have dark skin. You take certain medicines. You are overweight or obese. What are the signs or symptoms? In mild cases, there may not be any symptoms. If the condition is very bad, symptoms may include: Bone pain. Muscle pain. Falling often. Broken bones caused by a minor injury. How is this treated? Treatment may include taking supplements as told by your doctor. Your doctor will tell you what dose is best for you. Supplements may include: Vitamin D. Calcium. Follow these instructions at home: Eating and drinking  Eat foods that contain vitamin D, such as: Dairy products, cereals, or juices with added vitamin D. Check the label. Fish, such as salmon or trout. Eggs. Oysters. Mushrooms. The items listed above may not be a complete list of what you can eat and drink. Contact a dietitian for more options. General instructions Take medicines and  supplements only as told by your doctor. Get regular, safe exposure to natural sunlight. Do not use a tanning bed. Maintain a healthy weight. Lose weight if needed. Keep all follow-up visits as told by your doctor. This is important. How is this prevented? You can get vitamin D by: Eating foods that naturally contain vitamin D. Eating or drinking products that have vitamin D added to them, such as cereals, juices, and milk. Taking vitamin D or a multivitamin that contains vitamin D. Being in the sun. Your body makes vitamin D when your skin is exposed to sunlight. Your body changes the sunlight into a form of the vitamin that it can use. Contact a doctor if: Your symptoms do not go away. You feel sick to your stomach (nauseous). You throw up (vomit). You poop less often than normal, or you have trouble pooping (constipation). Summary Vitamin D deficiency is when your body does not have enough vitamin D. Vitamin D helps to keep your bones strong and healthy. This condition is often treated by taking a supplement. Your doctor will tell you what dose is best for you. This information is not intended to replace advice given to you by your health care provider. Make sure you discuss any questions you have with your health care provider. Document Revised: 12/28/2017 Document Reviewed: 12/28/2017 Elsevier Patient Education  2022 [employer]  

## 2021-02-13 ENCOUNTER — Telehealth: Payer: Self-pay

## 2021-02-13 LAB — CMP14+EGFR
ALT: 10 IU/L (ref 0–32)
AST: 13 IU/L (ref 0–40)
Albumin/Globulin Ratio: 1.5 (ref 1.2–2.2)
Albumin: 4.2 g/dL (ref 3.7–4.7)
Alkaline Phosphatase: 109 IU/L (ref 44–121)
BUN/Creatinine Ratio: 18 (ref 12–28)
BUN: 27 mg/dL (ref 8–27)
Bilirubin Total: 0.2 mg/dL (ref 0.0–1.2)
CO2: 28 mmol/L (ref 20–29)
Calcium: 9.5 mg/dL (ref 8.7–10.3)
Chloride: 97 mmol/L (ref 96–106)
Creatinine, Ser: 1.49 mg/dL — ABNORMAL HIGH (ref 0.57–1.00)
Globulin, Total: 2.8 g/dL (ref 1.5–4.5)
Glucose: 161 mg/dL — ABNORMAL HIGH (ref 70–99)
Potassium: 4.9 mmol/L (ref 3.5–5.2)
Sodium: 138 mmol/L (ref 134–144)
Total Protein: 7 g/dL (ref 6.0–8.5)
eGFR: 37 mL/min/{1.73_m2} — ABNORMAL LOW (ref >59)

## 2021-02-13 LAB — LIPID PANEL
Chol/HDL Ratio: 3.5 ratio (ref 0.0–4.4)
Cholesterol, Total: 139 mg/dL (ref 100–199)
HDL: 40 mg/dL (ref >39)
LDL Chol Calc (NIH): 67 mg/dL (ref 0–99)
Triglycerides: 194 mg/dL — ABNORMAL HIGH (ref 0–149)
VLDL Cholesterol Cal: 32 mg/dL (ref 5–40)

## 2021-02-13 LAB — HEMOGLOBIN A1C
Est. average glucose Bld gHb Est-mCnc: 177 mg/dL
Hgb A1c MFr Bld: 7.8 % — ABNORMAL HIGH (ref 4.8–5.6)

## 2021-02-13 LAB — VITAMIN D 25 HYDROXY (VIT D DEFICIENCY, FRACTURES): Vit D, 25-Hydroxy: 39.8 ng/mL (ref 30.0–100.0)

## 2021-02-13 IMAGING — CT CT ABDOMEN W/O CM
2 of 4 series · 14 of 46 positions shown, 16 images · non-contrast
Comparison: CT 08/23/2019 and 10/24/2009

CLINICAL DATA: Evaluate left adrenal nodule.

EXAM:
CT ABDOMEN WITHOUT CONTRAST
TECHNIQUE: Multidetector CT imaging of the abdomen was performed following the
standard protocol without IV contrast.

[Series 2: axial st · axial · 0.92mm/px · z∈[-392,-161]mm · 11 of 85 slices shown, 13 images]
[im 4/85  soft-tissue]
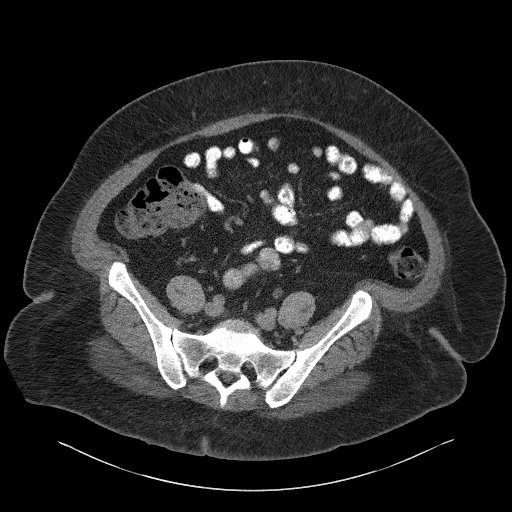
[im 4/85  bone]
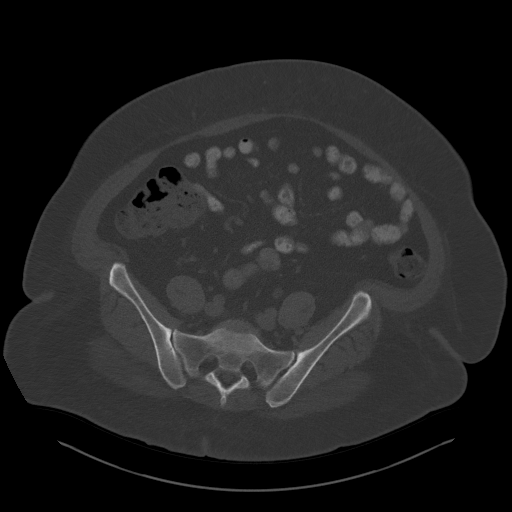
[im 12/85  soft-tissue]
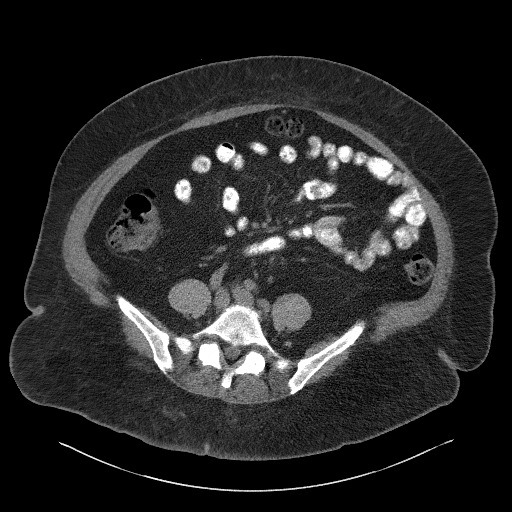
[im 20/85  soft-tissue]
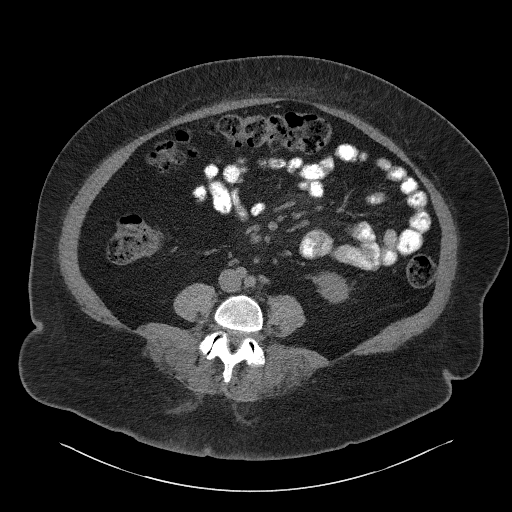
[im 27/85  soft-tissue]
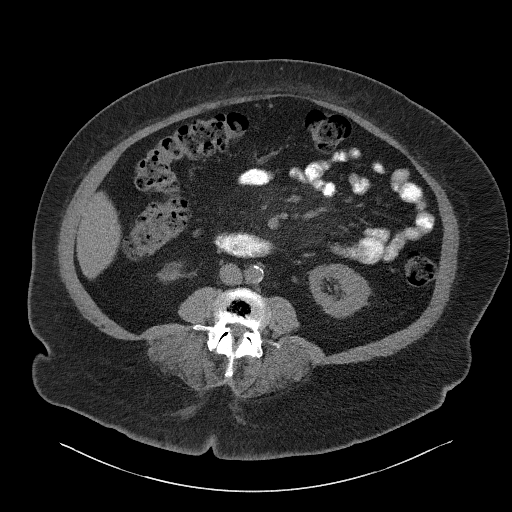
[im 35/85  soft-tissue]
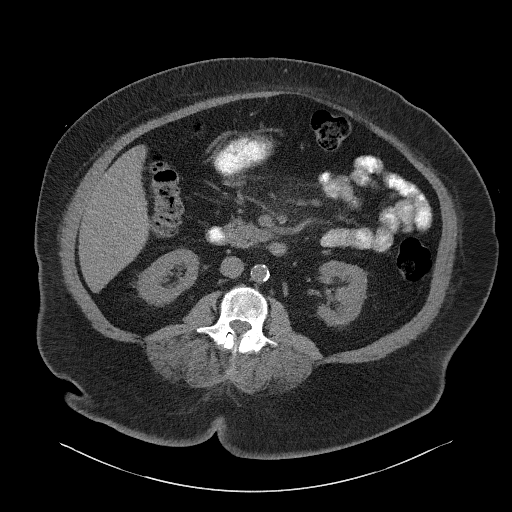
[im 43/85  soft-tissue]
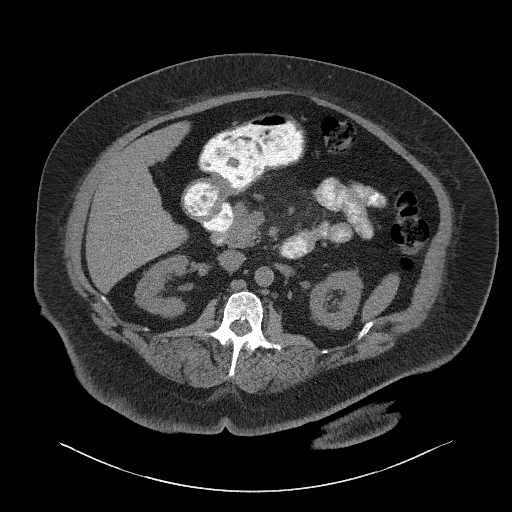
[im 50/85  soft-tissue]
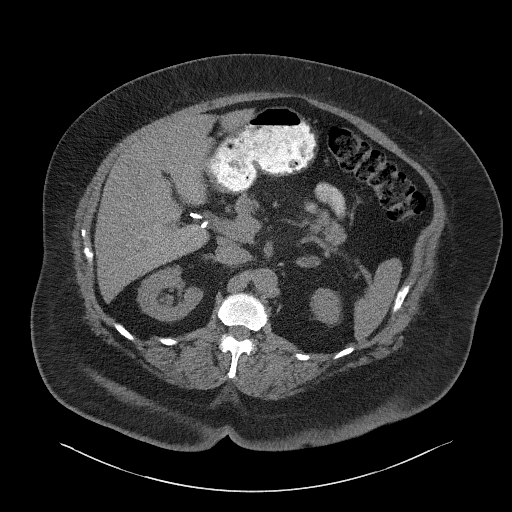
[im 58/85  soft-tissue]
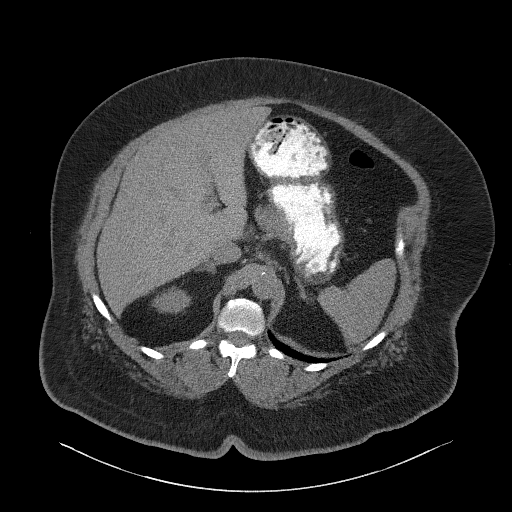
[im 65/85  soft-tissue]
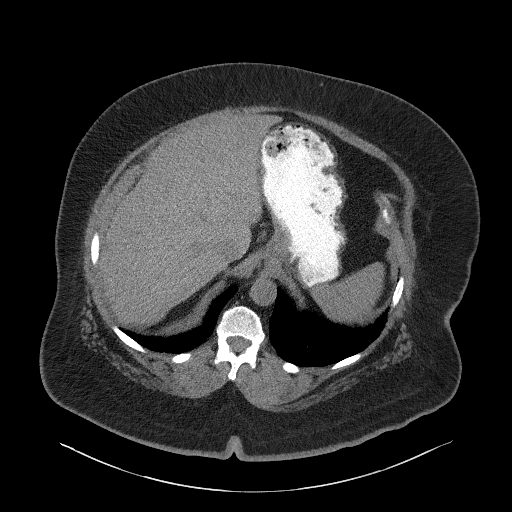
[im 65/85  bone]
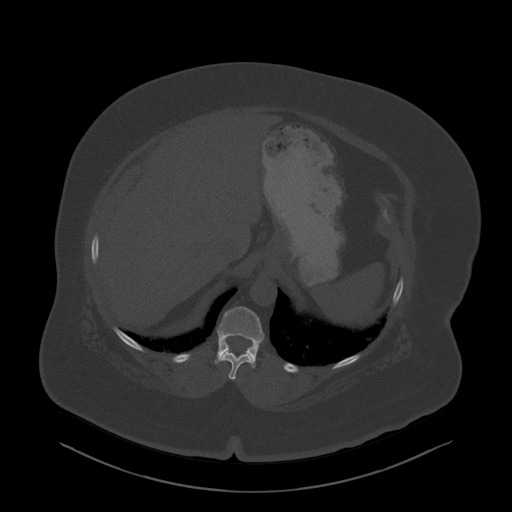
[im 73/85  soft-tissue]
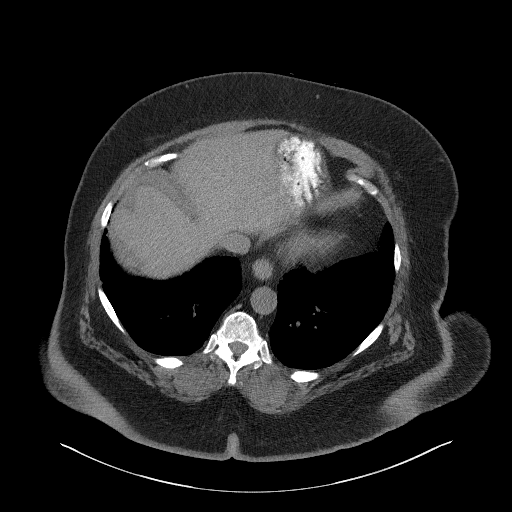
[im 81/85  soft-tissue]
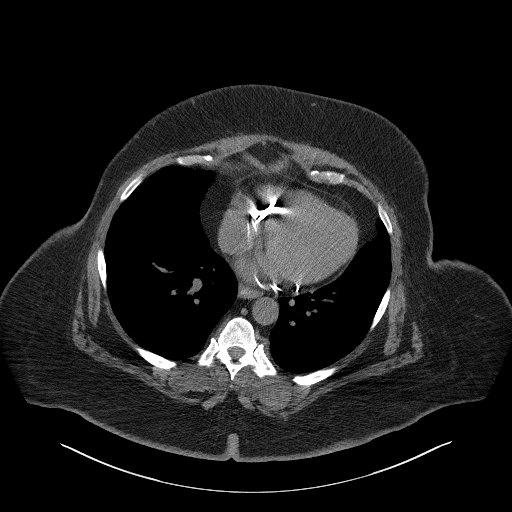

[Series 4: coronal wo · coronal · 0.49mm/px · 3 of 187 slices shown]
[im 63/187  soft-tissue]
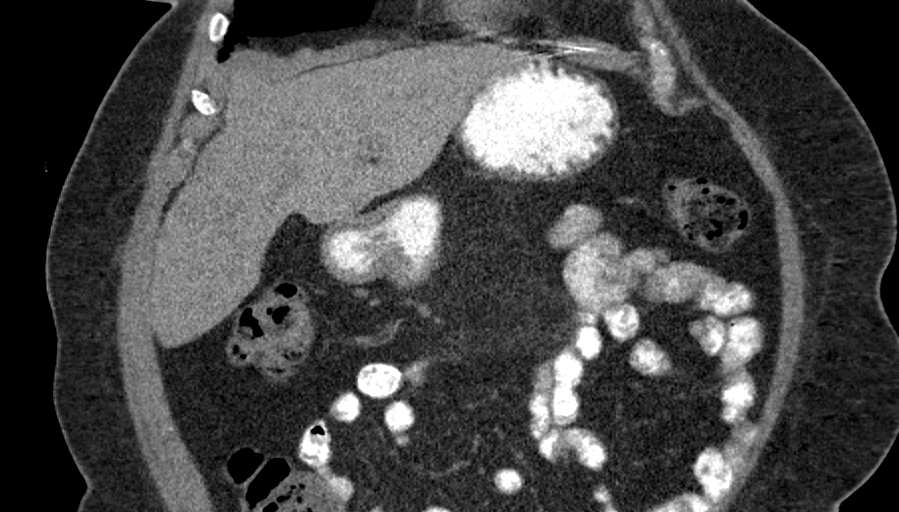
[im 83/187  soft-tissue]
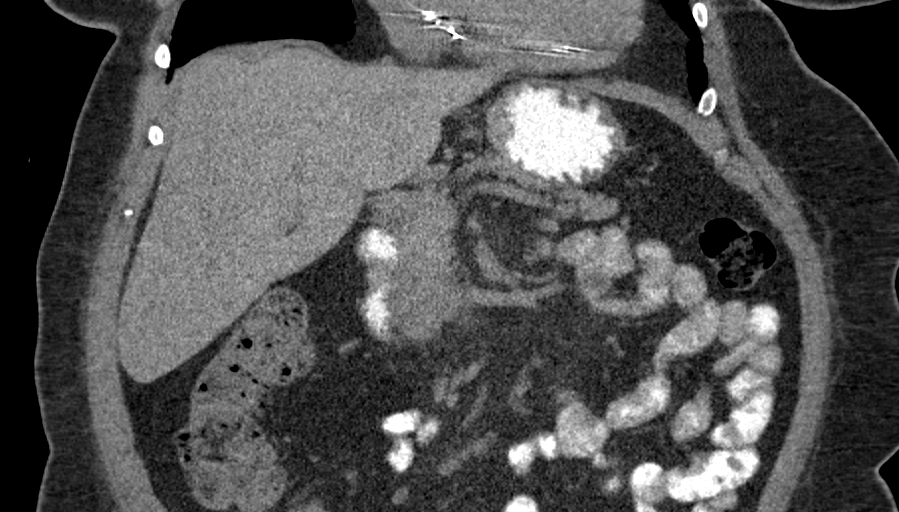
[im 104/187  soft-tissue]
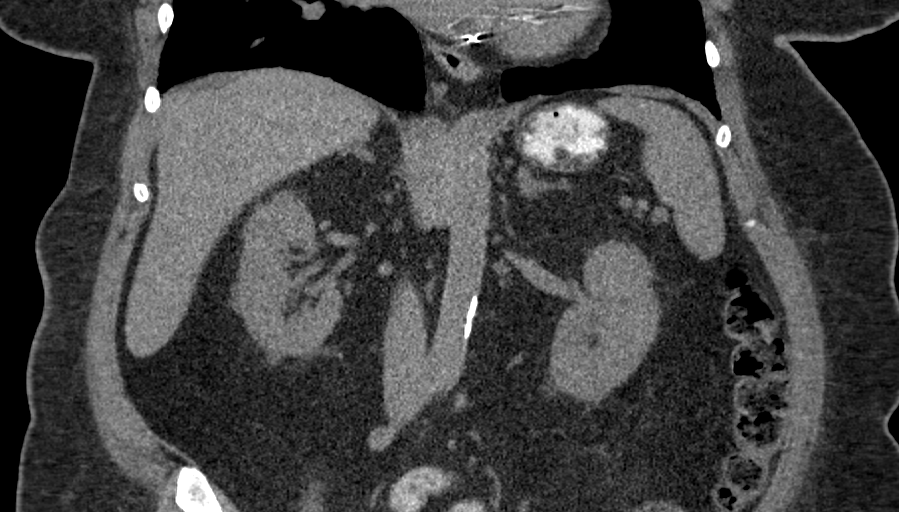

[14 of 46 positions shown; findings below may reference images not displayed]

FINDINGS: Lower chest: Scattered pleural based reticular densities in both
lungs are suggestive for chronic changes and suspect at least mild
scarring. No significant pleural fluid. Evidence for a biventricular
ICD. No significant pericardial fluid. 4 mm nodular density in left
lower lobe on sequence 6, image 8.

Hepatobiliary: Normal appearance of the liver. Gallbladder is been
surgically removed.

Pancreas: Unremarkable. No pancreatic ductal dilatation or
surrounding inflammatory changes.

Spleen: Normal in size without focal abnormality.

Adrenals/Urinary Tract: Again noted is a low-density nodule
involving the left adrenal gland that measures 2.3 x 1.2 cm. The
Hounsfield units on this noncontrast examination are less than 10
and this is compatible with a benign adrenal adenoma. This nodule
measured roughly 1.7 cm in 0933. Fullness in the right adrenal gland
on sequence 2, image [DATE] represent a small nodule measuring up to
1.5 cm and the Hounsfield units are roughly 4. This is also
suggestive for small adenoma or possibly hyperplasia. Negative for
kidney stones or hydronephrosis. No suspicious renal lesion.

Stomach/Bowel: Normal appearance of the stomach and visualized bowel
structures.

Vascular/Lymphatic: Atherosclerotic calcifications involving the
abdominal aorta without aneurysm. Slightly prominent mesenteric and
retroperitoneal lymph nodes that are stable. Retroperitoneal lymph
node near the aortic bifurcation on sequence 2, image 68 measures
1.1 cm in the short axis and minimally changed since 0933.Central
mesenteric lymph node on sequence 2, image 61 measures 1.1 cm in the
short axis and stable since 0933. Again noted is mild stranding in
the central mesentery which appears to be a chronic finding.

Other: No ascites.

Musculoskeletal: Vacuum disc at L3-L4, L4-L5 and L5-S1. Vacuum
phenomenon involving bilateral facet joints in lower lumbar spine.
IMPRESSION: 1. Left adrenal nodule is compatible with a benign adenoma.
2. Right adrenal nodule is 4compatible with a benign adenoma.
3. No acute abnormality in the abdomen.
4. Indeterminate 4 mm nodule in the left lower lobe. No follow-up
needed if patient is low-risk. Non-contrast chest CT can be
considered in 12 months if patient is high-risk. This recommendation
follows the consensus statement: Guidelines for Management of
Incidental Pulmonary Nodules Detected on CT Images: From the

## 2021-02-13 NOTE — Chronic Care Management (AMB) (Signed)
    Called [name], No answer, left message of appointment on 02-14-2021 at 4:00 via telephone visit with [name], Pharm D. Notified to have all medications, supplements, blood pressure and/or blood sugar logs available during appointment and to return call if need to reschedule.   Care Gaps: Zoster Vaccines- Shingrix- Overdue - never done COVID-19 Vaccine (4 - Booster for [employer] series)- Last completed: Feb 21, 2020  Star Rating Drug: Pravastatin 40 mg- Last filled 12/14/2020 for 90 Day supply Trulicity- PAP Valsartan-HCTZ 160/25 mg- Last filled 12/14/2020 for 90 day supply.  Any gaps in medications fill history? No  [place] Pharmacist Assistant [phone]

## 2021-02-14 ENCOUNTER — Telehealth: Payer: Medicare Other

## 2021-02-14 ENCOUNTER — Ambulatory Visit (INDEPENDENT_AMBULATORY_CARE_PROVIDER_SITE_OTHER): Payer: Medicare Other

## 2021-02-14 DIAGNOSIS — E1122 Type 2 diabetes mellitus with diabetic chronic kidney disease: Secondary | ICD-10-CM | POA: Diagnosis not present

## 2021-02-14 DIAGNOSIS — E119 Type 2 diabetes mellitus without complications: Secondary | ICD-10-CM

## 2021-02-14 DIAGNOSIS — E7800 Pure hypercholesterolemia, unspecified: Secondary | ICD-10-CM

## 2021-02-14 DIAGNOSIS — R197 Diarrhea, unspecified: Secondary | ICD-10-CM | POA: Diagnosis not present

## 2021-02-14 DIAGNOSIS — I509 Heart failure, unspecified: Secondary | ICD-10-CM | POA: Diagnosis not present

## 2021-02-14 DIAGNOSIS — N1832 Chronic kidney disease, stage 3b: Secondary | ICD-10-CM | POA: Diagnosis not present

## 2021-02-14 DIAGNOSIS — N390 Urinary tract infection, site not specified: Secondary | ICD-10-CM | POA: Diagnosis not present

## 2021-02-14 DIAGNOSIS — I129 Hypertensive chronic kidney disease with stage 1 through stage 4 chronic kidney disease, or unspecified chronic kidney disease: Secondary | ICD-10-CM | POA: Diagnosis not present

## 2021-02-14 NOTE — Progress Notes (Signed)
Chronic Care Management Pharmacy Note  02/27/2021 Name:  Yvonne Miller MRN:  [id number] DOB:  May 12, 1950  Summary: Patient reports that she is doing much better. She is still eating healthy and taking her medication on a routine basis.   Recommendations/Changes made from today's visit: Recommend patient continue current medication regimen   Plan: Patient to continue current medication regimen, and continue to keep in contact with PCP team with any questions she might have.    Subjective: Yvonne Miller is an 70 y.o. year old female who is a primary patient of [name], MD.  The CCM team was consulted for assistance with disease management and care coordination needs.    Engaged with patient by telephone for follow up visit in response to provider referral for pharmacy case management and/or care coordination services.   Consent to Services:  The patient was given information about Chronic Care Management services, agreed to services, and gave verbal consent prior to initiation of services.  Please see initial visit note for detailed documentation.   Patient Care Team: [name], MD as PCP - General (Internal Medicine) Lynne Logan, RN as Case Manager [name], [facility] (Pharmacist)  Recent office visits: 02/12/2021 PCP OV  Recent consult visits: 12/28/2020 Cardiology [facility] visits: None in previous 6 months   Objective:  Lab Results  Component Value Date   CREATININE 1.49 (H) 02/12/2021   BUN 27 02/12/2021   GFRNONAA 34 (L) 11/14/2020   GFRAA 45 (L) 06/21/2020   NA 138 02/12/2021   K 4.9 02/12/2021   CALCIUM 9.5 02/12/2021   CO2 28 02/12/2021   GLUCOSE 161 (H) 02/12/2021    Lab Results  Component Value Date/Time   HGBA1C 7.8 (H) 02/12/2021 09:10 AM   HGBA1C 8.0 (H) 10/10/2020 10:41 AM   MICROALBUR 30 10/10/2020 12:38 PM   MICROALBUR 30 10/05/2019 12:07 PM    Last diabetic Eye exam:  Lab Results  Component Value Date/Time    HMDIABEYEEXA No Retinopathy 12/04/2020 12:00 AM    Last diabetic Foot exam: No results found for: HMDIABFOOTEX   Lab Results  Component Value Date   CHOL 139 02/12/2021   HDL 40 02/12/2021   LDLCALC 67 02/12/2021   TRIG 194 (H) 02/12/2021   CHOLHDL 3.5 02/12/2021    Hepatic Function Latest Ref Rng & Units 02/12/2021 10/10/2020 09/25/2020  Total Protein 6.0 - 8.5 g/dL 7.0 7.6 7.7  Albumin 3.7 - 4.7 g/dL 4.2 4.5 -  AST 0 - 40 IU/L 13 19 -  ALT 0 - 32 IU/L 10 15 -  Alk Phosphatase 44 - 121 IU/L 109 105 -  Total Bilirubin 0.0 - 1.2 mg/dL 0.2 0.2 -  Bilirubin, Direct 0.00 - 0.40 mg/dL - <0.10 -    Lab Results  Component Value Date/Time   TSH 1.110 06/21/2020 11:37 AM   TSH 1.375  04/21/2009 06:57 PM    CBC Latest Ref Rng & Units 11/14/2020 06/21/2020 10/05/2019  WBC 4.0 - 10.5 K/uL 10.1 10.5 10.6  Hemoglobin 12.0 - 15.0 g/dL 12.0 11.6 11.5  Hematocrit 36.0 - 46.0 % 37.7 36.8 36.1  Platelets 150 - 400 K/uL 300 333 328    Lab Results  Component Value Date/Time   VD25OH 39.8 02/12/2021 09:10 AM    Clinical ASCVD: Yes  The 10-year ASCVD risk score (Arnett DK, et al., 2019) is: 21.2%   Values used to calculate the score:     Age: 70 years  Sex: Female     Is Non-Hispanic African American: Yes     Diabetic: Yes     Tobacco smoker: No     Systolic Blood Pressure: 976 mmHg     Is BP treated: Yes     HDL Cholesterol: 40 mg/dL     Total Cholesterol: 139 mg/dL    Depression screen [facility] 2/9 11/22/2020 06/21/2020 10/05/2019  Decreased Interest 0 0 0  Down, Depressed, Hopeless 0 0 0  PHQ - 2 Score 0 0 0  Altered sleeping - - -  Tired, decreased energy - - -  Change in appetite - - -  Feeling bad or failure about yourself  - - -  Trouble concentrating - - -  Moving slowly or fidgety/restless - - -  Suicidal thoughts - - -  PHQ-9 Score - - -  Difficult doing work/chores - - -     Social History   Tobacco Use  Smoking Status Never  Smokeless Tobacco Never   BP Readings  from Last 3 Encounters:  02/12/21 134/72  01/16/21 140/71  01/14/21 117/78   Pulse Readings from Last 3 Encounters:  02/12/21 80  01/16/21 69  12/28/20 69   Wt Readings from Last 3 Encounters:  02/12/21 271 lb 9.6 oz (123.2 kg)  12/28/20 273 lb (123.8 kg)  11/22/20 278 lb (126.1 kg)   BMI Readings from Last 3 Encounters:  02/12/21 48.42 kg/m  12/28/20 46.86 kg/m  11/22/20 47.72 kg/m    Assessment/Interventions: Review of patient past medical history, allergies, medications, health status, including review of consultants reports, laboratory and other test data, was performed as part of comprehensive evaluation and provision of chronic care management services.   SDOH:  (Social Determinants of Health) assessments and interventions performed: No  SDOH Screenings   Alcohol Screen: Not on file  Depression (PHQ2-9): Low Risk    PHQ-2 Score: 0  Financial Resource Strain: Low Risk    Difficulty of Paying Living Expenses: Not hard at all  Food Insecurity: No Food Insecurity   Worried About Charity fundraiser in the Last Year: Never true   Ran Out of Food in the Last Year: Never true  Housing: Not on file  Physical Activity: Insufficiently Active   Days of Exercise per Week: 3 days   Minutes of Exercise per Session: 30 min  Social Connections: Not on file  Stress: No Stress Concern Present   Feeling of Stress : Not at all  Tobacco Use: Low Risk    Smoking Tobacco Use: Never   Smokeless Tobacco Use: Never   Passive Exposure: Not on file  Transportation Needs: No Transportation Needs   Lack of Transportation (Medical): No   Lack of Transportation (Non-Medical): No    CCM Care Plan  Allergies  Allergen Reactions   Other Other (See Comments)    NO BLOOD   . Jehovah witness    Medications Reviewed Today     Reviewed by Lynne Logan, RN (Registered Nurse) on 02/18/21 at 1356  Med List Status: <None>   Medication Order Taking? Sig Documenting Provider Last Dose  Status Informant  0.9 %  sodium chloride infusion [id number]   Minette Brine, FNP  Active   ACCU-CHEK AVIVA PLUS test strip [id number] No CHECK 4 TIMES BY INTRADERMAL ROUTE EVERY DAY [name], MD Taking Active   albuterol (PROVENTIL HFA;VENTOLIN HFA) 108 (90 Base) MCG/ACT inhaler [id number] No Inhale 2 puffs into the lungs every 4 (four) hours as needed for wheezing  or shortness of breath.  [provider] Taking Active Self  amLODipine (NORVASC) 5 MG tablet [id number] No TAKE 1 TABLET BY MOUTH  DAILY Adrian Prows, MD Taking Active   aspirin EC 81 MG tablet [id number] No Take 81 mg by mouth at bedtime.  [provider] Taking Active Self           Med Note Michelle Nasuti   Thu Jun 16, 2019  8:38 AM)    B Complex Vitamins (B COMPLEX-B12) TABS [id number] No Take 1 tablet by mouth daily. [provider] Taking Active Self           Med Note Jannifer Rodney, APRIL   Thu Oct 21, 2018  8:17 AM)    carvedilol (COREG) 25 MG tablet [id number] No Take 1.5 tablets (37.5 mg total) by mouth 2 (two) times daily. Adrian Prows, MD Taking Active   cephALEXin (KEFLEX) 250 MG capsule [id number]  Take 250 mg by mouth daily. [provider]  Active   Cholecalciferol (VITAMIN D) 50 MCG (2000 UT) tablet [id number] No Take 2,000 Units by mouth daily. [provider] Taking Active   Chromium 1000 MCG TABS [id number] No Take 1,000 mcg by mouth daily.  [provider] Taking Active   Cinnamon 500 MG capsule [id number] No Take 500 mg by mouth daily. [provider] Taking Active Self           Med Note Jannifer Rodney, APRIL   Thu Oct 21, 2018  8:17 AM)    estradiol (ESTRACE) 0.1 MG/GM vaginal cream [id number]  Place vaginally. [provider]  Active   fluticasone (FLONASE) 50 MCG/ACT nasal spray [id number] No Place 1 spray into both nostrils daily.  Patient taking differently: Place 1 spray into both nostrils as needed for allergies.   [name], MD Taking  Expired 11/22/20 2359   furosemide (LASIX) 40 MG tablet [id number] No Take 1 tablet (40 mg total) by mouth every morning. One tab twice daily for 1 week  Patient taking differently: Take 40 mg by mouth every morning.   Adrian Prows, MD Taking Active   hydrALAZINE (APRESOLINE) 50 MG tablet [id number] No TAKE 1 TABLET BY MOUTH  TWICE DAILY Adrian Prows, MD Taking Active   Insulin Lispro Prot & Lispro (HUMALOG MIX 75/25 KWIKPEN) (75-25) 100 UNIT/ML Claiborne Rigg [id number] No Inject 45 Units into the skin at bedtime.  Patient taking differently: Inject 60 Units into the skin at bedtime.   [name], MD Taking Active   nystatin (NYSTATIN) powder [id number] No Apply topically 3 (three) times daily as needed. As needed [name], MD Taking Active   Omega-3 Fatty Acids (FISH OIL) 500 MG CAPS [id number] No Take 500 mg by mouth daily. [provider] Taking Active   omeprazole (PRILOSEC) 40 MG capsule [id number] No Take 40 mg by mouth daily. [provider] Taking Active   pravastatin (PRAVACHOL) 40 MG tablet [id number] No TAKE 1 TABLET BY MOUTH  DAILY [name], MD Taking Active   Probiotic Product (PROBIOTIC PO) [id number] No Take by mouth. [provider] Taking Active   ROCKLATAN 0.02-0.005 % SOLN [id number] No SMARTSIG:1 Drop(s) In Eye(s) Every Evening [provider] Taking Active   spironolactone (ALDACTONE) 25 MG tablet [id number] No TAKE 1 TABLET BY MOUTH IN  THE Alfonso Patten, MD Taking Active   TRULICITY 1.5 QI/2.9NL SOPN [id number] No INJECT 1.5 MG INTO THE SKIN ONCE A WEEK.  Patient taking differently: Inject 2 mg as directed every Wednesday.   Baird Cancer,  Bailey Mech, MD Taking Active   valsartan-hydrochlorothiazide (DIOVAN-HCT) 160-25 MG tablet [id number] No TAKE 1 TABLET BY MOUTH  DAILY [name], MD Taking Active             Patient Active Problem List   Diagnosis Date Noted   Adrenal nodule ([place]) 02/12/2021   Recurrent UTI 10/10/2020   Class 3  severe obesity due to excess calories with serious comorbidity and body mass index (BMI) of 45.0 to 49.9 in adult [facility]) 10/10/2020   Non-ischemic cardiomyopathy ([place]) 02/18/2019   Encounter for assessment of implantable cardioverter-defibrillator (ICD)    Type 2 diabetes mellitus with stage 3 chronic kidney disease, with long-term current use of insulin ([place]) 03/04/2018   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   Chronic combined systolic and diastolic congestive heart failure ([place]) 03/04/2018   Chronic renal disease, stage II 03/04/2018   Status post revision of total replacement of left knee 01/26/2018   Unilateral primary osteoarthritis, left knee 08/18/2017   Biventricular automatic implantable cardioverter defibrillator in situ 12/13/2013   ICD -  Biventricular  [employer] Dynagen X4 CRT-D Model G158 ICD in place 10/28/13 10/28/2013   HTN (hypertension) 10/03/2013   Hyperlipemia 10/03/2013   LBBB (left bundle branch block) 10/03/2013   Cardiomyopathy ([place]) 10/03/2013   DM (diabetes mellitus) ([place]) 10/03/2013    Immunization History  Administered Date(s) Administered   Fluad Quad(high Dose 65+) 02/07/2020, 02/12/2021   Hepatitis A 01/19/2012, 02/16/2012   Hepatitis B 01/19/2012, 02/16/2012   IPV 02/16/2012   Influenza, High Dose Seasonal PF 02/17/2016, 03/04/2018, 02/10/2019   Influenza-Unspecified 02/02/2014, 02/16/2014   MMR 04/04/1994   PFIZER(Purple Top)SARS-COV-2 Vaccination 06/11/2019, 07/02/2019, 02/21/2020   Pneumococcal Conjugate-13 05/16/2020   Td 09/08/2006   Tdap 01/19/2012, 06/21/2020   Zoster, Live 12/15/2007    Conditions to be addressed/monitored:  Hyperlipidemia and Diabetes  Care Plan : [place]  Updates made by [name], [place] since 02/27/2021 12:00 AM     Problem: HLD, DM II   Priority: High     Long-Range Goal: Disease Management   Start Date: 07/12/2020  Recent Progress: On track  Priority: High  Note:    Current Barriers:  Unable to independently monitor therapeutic efficacy  Pharmacist Clinical Goal(s):  Patient will achieve adherence to monitoring guidelines and medication adherence to achieve therapeutic efficacy through collaboration with PharmD and provider.   Interventions: 1:1 collaboration with [name], MD regarding development and update of comprehensive plan of care as evidenced by provider attestation and co-signature Inter-disciplinary care team collaboration (see longitudinal plan of care) Comprehensive medication review performed; medication list updated in electronic medical record  Hyperlipidemia: (LDL goal < 70) -Controlled -Current treatment: Pravastatin 40 mg tablet once per day -Current dietary patterns: she reports that she is no longer eating beef and pork, but she is eating more processed foods, she is going to cut back -Current exercise habits: she is still exercising three times per week  -Educated on Cholesterol goals;  Benefits of statin for ASCVD risk reduction; -Recommended to continue current medication  Diabetes (A1c goal <8%) -Controlled -Current medications: Trulicity 1.5 mg/ 0.5 ml - inject 1.5 mg into the skin once a week Insulin Lispro & Lispro - inject 45 units at bedtime She is doing a sliding scale of 45-60 units, greater than 180-200: 45-60 units, if it  -Current home glucose readings fasting glucose: 90,92,100,107 post prandial glucose: 180-130 -Denies hypoglycemic/hyperglycemic symptoms  In the past 2-3 weeks she had 1 low blood  sugar readings.  - One night she woke up and her BS was 70, and then it went up after       having candy     -Current meal patterns: she is eating plenty of vegetables an drinks: 3 bottles of water- 60 ounces  -Current exercise: three days per week with water aerobics an hour class  -Educated on Complications of diabetes including kidney damage, retinal damage, and cardiovascular disease; Prevention and  management of hypoglycemic episodes; -Counseled to check feet daily and get yearly eye exams -Recommended to continue current medication  Patient Goals/Self-Care Activities Patient will:  - take medications as prescribed  Follow Up Plan: The patient has been provided with contact information for the care management team and has been advised to call with any health related questions or concerns.       Medication Assistance: None required.  Patient affirms current coverage meets needs.  Compliance/Adherence/Medication fill history: Care Gaps: Shingrix Vaccine  COVID-19 Vaccine  Star-Rating Drugs: Valsartan- Hydrochlorothiazide 924-26 mg  tablet Trulicity 1.5 ST/4.1 ml  Patient's preferred pharmacy is:  [employer]  ([place], [place] [address] [place] [address] Phone: [phone] Fax: [phone]  [id number] [place], [place] [address] Phone: [phone] Fax: [phone]  Uses pill box? Yes Pt endorses 90% compliance  We discussed: Benefits of medication synchronization, packaging and delivery as well as enhanced pharmacist oversight with [employer]. Patient decided to: Continue current medication management strategy  Care Plan and Follow Up Patient Decision:  Patient agrees to Care Plan and Follow-up.  Plan: The patient has been provided with contact information for the care management team and has been advised to call with any health related questions or concerns.   [name], PharmD Clinical Pharmacist [facility] [phone]

## 2021-02-18 ENCOUNTER — Ambulatory Visit: Payer: Medicare Other

## 2021-02-18 DIAGNOSIS — I5032 Chronic diastolic (congestive) heart failure: Secondary | ICD-10-CM

## 2021-02-18 DIAGNOSIS — N1832 Chronic kidney disease, stage 3b: Secondary | ICD-10-CM

## 2021-02-18 DIAGNOSIS — E559 Vitamin D deficiency, unspecified: Secondary | ICD-10-CM

## 2021-02-18 DIAGNOSIS — I130 Hypertensive heart and chronic kidney disease with heart failure and stage 1 through stage 4 chronic kidney disease, or unspecified chronic kidney disease: Secondary | ICD-10-CM

## 2021-02-18 DIAGNOSIS — E1122 Type 2 diabetes mellitus with diabetic chronic kidney disease: Secondary | ICD-10-CM

## 2021-02-18 DIAGNOSIS — E785 Hyperlipidemia, unspecified: Secondary | ICD-10-CM

## 2021-02-18 NOTE — Chronic Care Management (AMB) (Signed)
Chronic Care Management   CCM RN Visit Note  02/18/2021 Name: Yvonne Miller MRN: [id number] DOB: [birth year]  Subjective: Yvonne Miller is a 70 y.o. year old female who is a primary care patient of [name], MD. The care management team was consulted for assistance with disease management and care coordination needs.    Engaged with patient by telephone for follow up visit in response to provider referral for case management and/or care coordination services.   Consent to Services:  The patient was given information about Chronic Care Management services, agreed to services, and gave verbal consent prior to initiation of services.  Please see initial visit note for detailed documentation.   Patient agreed to services and verbal consent obtained.   Assessment: Review of patient past medical history, allergies, medications, health status, including review of consultants reports, laboratory and other test data, was performed as part of comprehensive evaluation and provision of chronic care management services.   SDOH (Social Determinants of Health) assessments and interventions performed:  Yes, no acute needs   CCM Care Plan  Allergies  Allergen Reactions   Other Other (See Comments)    NO BLOOD   . Jehovah witness    Outpatient Encounter Medications as of 02/18/2021  Medication Sig   ACCU-CHEK AVIVA PLUS test strip CHECK 4 TIMES BY INTRADERMAL ROUTE EVERY DAY   albuterol (PROVENTIL HFA;VENTOLIN HFA) 108 (90 Base) MCG/ACT inhaler Inhale 2 puffs into the lungs every 4 (four) hours as needed for wheezing or shortness of breath.    amLODipine (NORVASC) 5 MG tablet TAKE 1 TABLET BY MOUTH  DAILY   aspirin EC 81 MG tablet Take 81 mg by mouth at bedtime.    B Complex Vitamins (B COMPLEX-B12) TABS Take 1 tablet by mouth daily.   carvedilol (COREG) 25 MG tablet Take 1.5 tablets (37.5 mg total) by mouth 2 (two) times daily.   cephALEXin (KEFLEX) 250 MG capsule Take 250 mg by mouth  daily.   Cholecalciferol (VITAMIN D) 50 MCG (2000 UT) tablet Take 2,000 Units by mouth daily.   Chromium 1000 MCG TABS Take 1,000 mcg by mouth daily.    Cinnamon 500 MG capsule Take 500 mg by mouth daily.   estradiol (ESTRACE) 0.1 MG/GM vaginal cream Place vaginally.   fluticasone (FLONASE) 50 MCG/ACT nasal spray Place 1 spray into both nostrils daily. (Patient taking differently: Place 1 spray into both nostrils as needed for allergies.)   furosemide (LASIX) 40 MG tablet Take 1 tablet (40 mg total) by mouth every morning. One tab twice daily for 1 week (Patient taking differently: Take 40 mg by mouth every morning.)   hydrALAZINE (APRESOLINE) 50 MG tablet TAKE 1 TABLET BY MOUTH  TWICE DAILY   Insulin Lispro Prot & Lispro (HUMALOG MIX 75/25 KWIKPEN) (75-25) 100 UNIT/ML Kwikpen Inject 45 Units into the skin at bedtime. (Patient taking differently: Inject 60 Units into the skin at bedtime.)   nystatin (NYSTATIN) powder Apply topically 3 (three) times daily as needed. As needed   Omega-3 Fatty Acids (FISH OIL) 500 MG CAPS Take 500 mg by mouth daily.   omeprazole (PRILOSEC) 40 MG capsule Take 40 mg by mouth daily.   pravastatin (PRAVACHOL) 40 MG tablet TAKE 1 TABLET BY MOUTH  DAILY   Probiotic Product (PROBIOTIC PO) Take by mouth.   ROCKLATAN 0.02-0.005 % SOLN SMARTSIG:1 Drop(s) In Eye(s) Every Evening   spironolactone (ALDACTONE) 25 MG tablet TAKE 1 TABLET BY MOUTH IN  THE MORNING   TRULICITY 1.5  MG/0.5ML SOPN INJECT 1.5 MG INTO THE SKIN ONCE A WEEK. (Patient taking differently: Inject 2 mg as directed every Wednesday.)   valsartan-hydrochlorothiazide (DIOVAN-HCT) 160-25 MG tablet TAKE 1 TABLET BY MOUTH  DAILY   Facility-Administered Encounter Medications as of 02/18/2021  Medication   0.9 %  sodium chloride infusion    Patient Active Problem List   Diagnosis Date Noted   Adrenal nodule ([place]) 02/12/2021   Recurrent UTI 10/10/2020   Class 3 severe obesity due to excess calories with serious  comorbidity and body mass index (BMI) of 45.0 to 49.9 in adult [facility]) 10/10/2020   Non-ischemic cardiomyopathy ([place]) 02/18/2019   Encounter for assessment of implantable cardioverter-defibrillator (ICD)    Type 2 diabetes mellitus with stage 3 chronic kidney disease, with long-term current use of insulin ([place]) 03/04/2018   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   Chronic combined systolic and diastolic congestive heart failure ([place]) 03/04/2018   Chronic renal disease, stage II 03/04/2018   Status post revision of total replacement of left knee 01/26/2018   Unilateral primary osteoarthritis, left knee 08/18/2017   Biventricular automatic implantable cardioverter defibrillator in situ 12/13/2013   ICD -  Biventricular  [employer] Dynagen X4 CRT-D Model G158 ICD in place 10/28/13 10/28/2013   HTN (hypertension) 10/03/2013   Hyperlipemia 10/03/2013   LBBB (left bundle branch block) 10/03/2013   Cardiomyopathy ([place]) 10/03/2013   DM (diabetes mellitus) ([place]) 10/03/2013    Conditions to be addressed/monitored: DM type 2, HTN, HLD, CKD, CHF  Care Plan : Chronic Kidney (Adult)  Updates made by Lynne Logan, RN since 02/18/2021 12:00 AM     Problem: Disease Progression   Priority: Medium     Long-Range Goal: Disease Progression Prevented or Minimized   Start Date: 09/03/2020  Expected End Date: 09/03/2021  Recent Progress: On track  Priority: Medium  Note:   Objective:   Lab Results  Component Value Date   CREATININE 1.49 (H) 02/12/2021   CREATININE 1.60 (H) 11/14/2020   CREATININE 1.97 (H) 09/14/2020   Lab Results  Component Value Date   EGFR 37 (L) 02/12/2021  Current Barriers:  Ineffective Self Health Maintenance  Clinical Goal(s):  Collaboration with [name], MD regarding development and update of comprehensive plan of care as evidenced by provider attestation and co-signature Inter-disciplinary care team collaboration (see longitudinal plan of  care) patient will work with care management team to address care coordination and chronic disease management needs related to Disease Management Educational Needs Care Coordination Medication Management and Education Psychosocial Support   Interventions:  02/18/21 completed successful outbound call with patient  Evaluation of current treatment plan related to CKD Stage III , self-management and patient's adherence to plan as established by provider. Collaboration with [name], MD regarding development and update of comprehensive plan of care as evidenced by provider attestation       and co-signature Inter-disciplinary care team collaboration (see longitudinal plan of care) Provided education to patient about basic disease process related to CKD  Review of patient status, including review of consultants reports, relevant laboratory and other test results, and medications completed. Reviewed medications with patient and discussed importance of medication adherence Educated on the importance of drinking at least 64 oz of water daily unless otherwise directed, patient reports increasing water to 64-80 oz daily Discussed recent visit with Urology, patient feels having fewer UTI's has improved her renal function   Discussed plans with patient for ongoing care management follow up and provided patient with  direct contact information for care management team Self Care Activities:  Continue to adhere to MD recommendations for CKD  Continue to keep all scheduled follow up appointments Take medications as directed  Let your healthcare team know if you are unable to take your medications Call your pharmacy for refills at least 7 days prior to running out of medication Patient Goals: -increase water to 64 oz daily   Follow Up Plan: Telephone follow up appointment with care management team member scheduled for: 04/22/21     Care Plan : Heart Failure (Adult)  Updates made by Lynne Logan, RN since 02/18/2021 12:00 AM     Problem: Symptom Exacerbation (Heart Failure)   Priority: High     Long-Range Goal: Symptom Exacerbation Prevented or Minimized   Start Date: 09/03/2020  Expected End Date: 09/03/2021  Recent Progress: On track  Priority: High  Note:   Current Barriers:  Knowledge deficits related to basic heart failure pathophysiology and self care management Lack of scale in home Financial strain Nurse Case Manager Clinical Goal(s):  patient will weigh self daily and record patient will verbalize understanding of Heart Failure Action Plan and when to call doctor patient will take all Heart Failure mediations as prescribed Interventions:  02/18/21 completed successful outbound call with patient  Collaboration with [name], MD regarding development and update of comprehensive plan of care as evidenced by provider attestation and co-signature Inter-disciplinary care team collaboration (see longitudinal plan of care) Reviewed Cardiology f/u completed with Dr. Rockwell [place] on 12/28/20 with the following Assessment/Plan noted: No changes in the medications were done today.  She does have underlying stage IIIa to stage IIIb chronic kidney disease, in view of this I would recommend repeating her BMP, she is seeing Dr. [name] next month and this can be done then.  Otherwise she is stable from cardiac standpoint, I will see her back in 6 months for follow-up.   Reinforced importance to adhere to dietary and exercise recommendations  Reinforced importance of daily weight Discussed plans with patient for ongoing care management follow up and provided patient with direct contact information for care management team Self-Care Activities:  Takes Heart Failure Medications as prescribed Weighs daily and record (notifying MD of 3 lb weight gain over night or 5 lb in a week) Verbalizes understanding of and follows CHF Action Plan Adheres to low sodium diet  Patient Goals:  -  Take Heart Failure Medications as prescribed - Weigh daily and record (notify MD with 3 lb weight gain over night or 5 lb in a week) - Follow CHF Action Plan - Adhere to low sodium diet - develop a rescue plan - follow rescue plan if symptoms flare-up - know when to call the doctor - track symptoms and what helps feel better or worse  Follow Up Plan: Telephone follow up appointment with care management team member scheduled for: 04/22/21    Care Plan : Hypertension (Adult)  Updates made by Lynne Logan, RN since 02/18/2021 12:00 AM     Problem: Hypertension (Hypertension)   Priority: Medium     Long-Range Goal: Hypertension Monitored   Start Date: 09/03/2020  Expected End Date: 09/03/2021  Recent Progress: On track  Priority: Medium  Note:   BP Readings from Last 3 Encounters:  02/12/21 134/72  01/16/21 140/71  01/14/21 117/78   Objective:   Lab Results  Component Value Date   CREATININE 1.49 (H) 02/12/2021   CREATININE 1.60 (H) 11/14/2020   CREATININE 1.97 (  H) 09/14/2020   Lab Results  Component Value Date   EGFR 37 (L) 02/12/2021  Current Barriers:  Knowledge Deficits related to basic understanding of hypertension pathophysiology and self care management Knowledge Deficits related to understanding of medications prescribed for management of hypertension Case Manager Clinical Goal(s):  patient will demonstrate improved adherence to prescribed treatment plan for hypertension as evidenced by taking all medications as prescribed, monitoring and recording blood pressure as directed, adhering to low sodium/DASH diet Interventions:  02/18/21 completed successful outbound call with patient  Collaboration with [name], MD regarding development and update of comprehensive plan of care as evidenced by provider attestation and co-signature Inter-disciplinary care team collaboration (see longitudinal plan of care) Advised patient, providing education and rationale, to  monitor blood pressure daily and record, calling PCP for findings outside established parameters. Evaluation of current treatment plan related to hypertension self management and patient's adherence to plan as established by provider. Reviewed medications with patient and discussed importance of compliance Mailed printed educational materials related to How to Accurately Monitor Blood Pressure at Home Discussed plans with patient for ongoing care management follow up and provided patient with direct contact information for care management team Self-Care Activities: Self administers medications as prescribed Attends all scheduled provider appointments Calls provider office for new concerns, questions, or BP outside discussed parameters Patient Goals: - check BP and record as directed - Follow a low Sodium diet   Follow Up Plan: Telephone follow up appointment with care management team member scheduled for: 04/22/21    Care Plan : Impaired Urinary Elimination  Updates made by Lynne Logan, RN since 02/18/2021 12:00 AM     Problem: Impaired Urinary Elimination (reoccurring UTI's)   Priority: High     Long-Range Goal: Impaired Urinary Elimination   Start Date: 09/03/2020  Expected End Date: 09/03/2021  Recent Progress: On track  Priority: High  Note:   Current Barriers:  Ineffective Self Health Maintenance  Clinical Goal(s):  Collaboration with [name], MD regarding development and update of comprehensive plan of care as evidenced by provider attestation and co-signature Inter-disciplinary care team collaboration (see longitudinal plan of care) patient will work with care management team to address care coordination and chronic disease management needs related to Disease Management Educational Needs Care Coordination Medication Management and Education Psychosocial Support   Interventions:  02/18/21 completed successful outbound call with patient  Evaluation of current  treatment plan related to  Impaired Urinary Elimination , self-management and patient's adherence to plan as established by provider. Collaboration with [name], MD regarding development and update of comprehensive plan of care as evidenced by provider attestation       and co-signature Inter-disciplinary care team collaboration (see longitudinal plan of care) Provided education to patient about basic disease process related to reoccurring UTI's Review of patient status, including review of consultants reports, relevant laboratory and other test results, and medications completed. Reviewed medications with patient and discussed importance of medication adherence Determined patient is being followed by Urology for reoccurring UTI's, she is prescribed to take a low dose antibiotic x 1 year Educated patient while providing rationale the importance to take this medication exactly as prescribed and to report noted SE promptly Educated on dietary recommendations to help lower yeast production  Encouraged patient to report reoccurring symptoms promptly for early detection/treatment  Reinforced the importance to continue drinking 64-80 oz of water daily  Discussed plans with patient for ongoing care management follow up and provided patient with direct contact  information for care management team Self Care Activities:  Continue to keep all scheduled follow up appointments Take medications as directed  Let your healthcare team know if you are unable to take your medications Call your pharmacy for refills at least 7 days prior to running out of medication Patient Goals: Increase water intake to 64 oz daily unless otherwise directed Use perineal hygiene as discussed  Take low dose oral antibiotic as prescribed for treatment of UTI Report reoccurring symptoms of UTI promptly for early diagnosis/treatment  Follow Up Plan: Telephone follow up appointment with care management team member scheduled  for: 03/23/21     Care Plan : Altered Bowel Elimination  Updates made by Lynne Logan, RN since 02/18/2021 12:00 AM  Completed 02/18/2021   Problem: Altered Bowel Elimination Resolved 02/18/2021  Priority: High     Long-Range Goal: Altered Bowel Elimination complications prevented or minimized Completed 02/18/2021  Start Date: 11/29/2020  Expected End Date: 11/29/2021  Recent Progress: On track  Priority: High  Note:   Current Barriers:  Ineffective Self Health Maintenance in a patient with DM type 2, HTN, HLD, CKD, CHF Clinical Goal(s):  Collaboration with [name], MD regarding development and update of comprehensive plan of care as evidenced by provider attestation and co-signature Inter-disciplinary care team collaboration (see longitudinal plan of care) patient will work with care management team to address care coordination and chronic disease management needs related to Disease Management Educational Needs Care Coordination Medication Management and Education Psychosocial Support   Interventions:  02/18/21 completed successful outbound call with patient Evaluation of current treatment plan related to  Altered Bowel Elimination , self-management and patient's adherence to plan as established by provider. Collaboration with [name], MD regarding development and update of comprehensive plan of care as evidenced by provider attestation       and co-signature Inter-disciplinary care team collaboration (see longitudinal plan of care) Determined patient's loose stools have resolved since adding a Probiotic due to having developed a sensitivity to fiber Determined patient is having no further symptoms since starting the Probiotic and making some dietary changes  Discussed plans with patient for ongoing care management follow up and provided patient with direct contact information for care management team Self Care Activities:  Self administers medications as  prescribed Attends all scheduled provider appointments Calls pharmacy for medication refills Calls provider office for new concerns or questions Patient Goals: - follow up with GI for evaluation of Altered bowel elimination  - keep a food journal to include all oral intake including all foods/drinks and medications, include/record all bowel movements to include time of the bowel movement and the consistency of the stool  - provide food journal to GI and PCP at next visit to help determine any contributing factors   Follow Up Plan: No further follow up required:         Care Plan : Diabetes Type 2 (Adult)  Updates made by Lynne Logan, RN since 02/18/2021 12:00 AM     Problem: Glycemic Management (Diabetes, Type 2)   Priority: High     Long-Range Goal: Glycemic Management Optimized   Start Date: 11/29/2020  Expected End Date: 11/29/2021  Recent Progress: On track  Priority: High  Note:   Objective:  Lab Results  Component Value Date   HGBA1C 7.8 (H) 02/12/2021   Lab Results  Component Value Date   CREATININE 1.49 (H) 02/12/2021   CREATININE 1.60 (H) 11/14/2020   CREATININE 1.97 (H) 09/14/2020   Lab  Results  Component Value Date   EGFR 37 (L) 02/12/2021  Current Barriers:  Knowledge Deficits related to basic Diabetes pathophysiology and self care/management Knowledge Deficits related to medications used for management of diabetes Case Manager Clinical Goal(s):  patient will demonstrate improved adherence to prescribed treatment plan for diabetes self care/management as evidenced by: daily monitoring and recording of CBG  adherence to ADA/ carb modified diet exercise 5 days/week adherence to prescribed medication regimen contacting provider for new or worsened symptoms or questions Interventions:  02/18/21 completed successful outbound call with patient  Collaboration with [name], MD regarding development and update of comprehensive plan of care as evidenced by  provider attestation and co-signature Inter-disciplinary care team collaboration (see longitudinal plan of care) Provided education to patient about basic DM disease process Review of patient status, including review of consultant's reports, relevant laboratory and other test results, and medications completed. Reviewed medications with patient and discussed importance of medication adherence Educated patient on dietary and exercise recommendations; daily glycemic control FBS 80-130, <180 after meals;15'15' rule Advised patient, providing education and rationale, to check cbg daily before meals and at bedtime and record, calling the CCM team and or PCP for findings outside established parameters Discussed plans with patient for ongoing care management follow up and provided patient with direct contact information for care management team Self-Care Activities Self administers oral medications as prescribed Self administers insulin as prescribed Self administers injectable DM medication (Trulicity) as prescribed Attends all scheduled provider appointments Checks blood sugars as prescribed and utilize hyper and hypoglycemia protocol as needed Adheres to prescribed ADA/carb modified Patient Goals: - check blood sugar at prescribed times - check blood sugar before and after exercise - check blood sugar if I feel it is too high or too low - enter blood sugar readings and medication or insulin into daily log - take the blood sugar log to all doctor visits - take the blood sugar meter to all doctor visits - drink 6 to 8 glasses of water each day - manage portion size - keep appointment with eye doctor - schedule appointment with eye doctor - check feet daily for cuts, sores or redness - do heel pump exercise 2 to 3 times each day - keep feet up while sitting - trim toenails straight across - wash and dry feet carefully every day - wear comfortable, cotton socks - wear comfortable,  well-fitting shoes  Follow Up Plan: Telephone follow up appointment with care management team member scheduled for: 04/22/21      Plan:Telephone follow up appointment with care management team member scheduled for:  04/22/21  Barb Merino, RN, BSN, CCM Care Management Coordinator [place] [facility]  Direct Phone: [phone]

## 2021-02-18 NOTE — Progress Notes (Signed)
This encounter was created in error - please disregard.

## 2021-02-18 NOTE — Patient Instructions (Signed)
Visit Information  PATIENT GOALS:  Goals Addressed      COMPLETED: Altered Bowel Elimination complications prevented or minized       Timeframe:  Long-Range Goal Priority:  High Start Date: 11/29/20                            Expected End Date: 11/29/21          Next Scheduled follow up: 02/14/21    Self Care Activities:  Self administers medications as prescribed Attends all scheduled provider appointments Calls pharmacy for medication refills Calls provider office for new concerns or questions Patient Goals: - follow up with GI for evaluation of Altered bowel elimination  - keep a food journal to include all oral intake including all foods/drinks and medications, include/record all bowel movements to include time of the bowel movement and the consistency of the stool  - provide food journal to GI and PCP at next visit to help determine any contributing factors                 Chronic Kidney disease progression prevented or minimized   On track    Timeframe:  Long-Range Goal Priority:  High Start Date: 09/03/20                            Expected End Date: 09/03/21   Follow up date: 04/22/21   Patient Goals:  -increase water to 64 oz daily                         Glycemic Management treatment optimized   On track    Timeframe:  Long-Range Goal Priority:  High Start Date:  11/29/20                           Expected End Date: 11/29/21          Follow up: 04/22/21        -check blood sugar at prescribed times - check blood sugar before and after exercise - check blood sugar if I feel it is too high or too low - enter blood sugar readings and medication or insulin into daily log - take the blood sugar log to all doctor visits - take the blood sugar meter to all doctor visits - drink 6 to 8 glasses of water each day - manage portion size            Impaired Urinary Elimination - Reoccurring UTI's   On track    Timeframe:  Short-Term Goal Priority:  High Start Date:  09/03/20                            Expected End Date:  09/03/21   Next Follow up date:  04/22/21  Patient Goals: Increase water intake to 64 oz daily unless otherwise directed Use perineal hygiene as discussed  Take oral antibiotics as prescribed for treatment of UTI Report reoccurring symptoms of UTI promptly for early diagnosis/treatment                Symptom Exacerbation Prevented or Minimized - Heart Failure   On track    Timeframe:  Long-Range Goal Priority:  High Start Date:  09/03/20  Expected End Date:  09/03/21   Next Follow up date:  04/22/21  Self-Care Activities:  Takes Heart Failure Medications as prescribed Weighs daily and record (notifying MD of 3 lb weight gain over night or 5 lb in a week) Verbalizes understanding of and follows CHF Action Plan Adheres to low sodium diet  Patient Goals:  - Take Heart Failure Medications as prescribed - Weigh daily and record (notify MD with 3 lb weight gain over night or 5 lb in a week) - Follow CHF Action Plan - Adhere to low sodium diet - develop a rescue plan - follow rescue plan if symptoms flare-up - know when to call the doctor - track symptoms and what helps feel better or worse                      Track and Manage My Blood Pressure-Hypertension   On track    Timeframe:  Long-Range Goal Priority:  High Start Date:  09/03/20                          Expected End Date: 09/03/21                     Follow Up Date: 04/22/21    Patient Goals: - check BP and record as directed - Follow a low Sodium diet    Why is this important?   You won't feel high blood pressure, but it can still hurt your blood vessels.  High blood pressure can cause heart or kidney problems. It can also cause a stroke.  Making lifestyle changes like losing a Kemia Wendel weight or eating less salt will help.  Checking your blood pressure at home and at different times of the day can help to control blood pressure.  If the doctor prescribes  medicine remember to take it the way the doctor ordered.  Call the office if you cannot afford the medicine or if there are questions about it.     Notes:      The patient verbalized understanding of instructions, educational materials, and care plan provided today and declined offer to receive copy of patient instructions, educational materials, and care plan.   Telephone follow up appointment with care management team member scheduled for: 04/22/21  Barb Merino, RN, BSN, CCM Care Management Coordinator [place] [facility]  Direct Phone: [phone]

## 2021-02-27 NOTE — Patient Instructions (Addendum)
Visit Information It was great speaking with you today!  Please let me know if you have any questions about our visit.   Goals Addressed             This Visit's Progress    Monitor and Manage My Blood Sugar-Diabetes Type 2       Timeframe:  Long-Range Goal Priority:  High Start Date:                             Expected End Date:                       Follow Up Date 06/05/2021  In Progress:   - check blood sugar at prescribed times - take the blood sugar log to all doctor visits - take the blood sugar meter to all doctor visits    Why is this important?   Checking your blood sugar at home helps to keep it from getting very high or very low.  Writing the results in a diary or log helps the doctor know how to care for you.  Your blood sugar log should have the time, date and the results.  Also, write down the amount of insulin or other medicine that you take.  Other information, like what you ate, exercise done and how you were feeling, will also be helpful.           Patient Care Plan: CCM Pharmacy Care Plan     Problem Identified: HLD, DM II   Priority: High     Long-Range Goal: Disease Management   Start Date: 07/12/2020  Recent Progress: On track  Priority: High  Note:   Current Barriers:  Unable to independently monitor therapeutic efficacy  Pharmacist Clinical Goal(s):  Patient will achieve adherence to monitoring guidelines and medication adherence to achieve therapeutic efficacy through collaboration with PharmD and provider.   Interventions: 1:1 collaboration with [name], MD regarding development and update of comprehensive plan of care as evidenced by provider attestation and co-signature Inter-disciplinary care team collaboration (see longitudinal plan of care) Comprehensive medication review performed; medication list updated in electronic medical record  Hyperlipidemia: (LDL goal < 70) -Controlled -Current treatment: Pravastatin 40 mg  tablet once per day -Current dietary patterns: she reports that she is no longer eating beef and pork, but she is eating more processed foods, she is going to cut back -Current exercise habits: she is still exercising three times per week  -Educated on Cholesterol goals;  Benefits of statin for ASCVD risk reduction; -Recommended to continue current medication  Diabetes (A1c goal <8%) -Controlled -Current medications: Trulicity 1.5 mg/ 0.5 ml - inject 1.5 mg into the skin once a week Insulin Lispro & Lispro - inject 45 units at bedtime She is doing a sliding scale of 45-60 units, greater than 180-200: 45-60 units, if it  -Current home glucose readings fasting glucose: 90,92,100,107 post prandial glucose: 180-130 -Denies hypoglycemic/hyperglycemic symptoms  In the past 2-3 weeks she had 1 low blood sugar readings.  - One night she woke up and her BS was 70, and then it went up after       having candy     -Current meal patterns: she is eating plenty of vegetables an drinks: 3 bottles of water- 60 ounces  -Current exercise: three days per week with water aerobics an hour class  -Educated on Complications of diabetes including kidney damage, retinal damage,  and cardiovascular disease; Prevention and management of hypoglycemic episodes; -Counseled to check feet daily and get yearly eye exams -Recommended to continue current medication  Patient Goals/Self-Care Activities Patient will:  - take medications as prescribed  Follow Up Plan: The patient has been provided with contact information for the care management team and has been advised to call with any health related questions or concerns.      Patient agreed to services and verbal consent obtained.   The patient verbalized understanding of instructions, educational materials, and care plan provided today and agreed to receive a mailed copy of patient instructions, educational materials, and care plan.   [name],  PharmD Clinical Pharmacist [facility] [phone]

## 2021-03-04 DIAGNOSIS — N1832 Chronic kidney disease, stage 3b: Secondary | ICD-10-CM

## 2021-03-04 DIAGNOSIS — E1122 Type 2 diabetes mellitus with diabetic chronic kidney disease: Secondary | ICD-10-CM | POA: Diagnosis not present

## 2021-03-04 DIAGNOSIS — H11432 Conjunctival hyperemia, left eye: Secondary | ICD-10-CM | POA: Diagnosis not present

## 2021-03-04 DIAGNOSIS — I5032 Chronic diastolic (congestive) heart failure: Secondary | ICD-10-CM | POA: Diagnosis not present

## 2021-03-04 DIAGNOSIS — E7800 Pure hypercholesterolemia, unspecified: Secondary | ICD-10-CM

## 2021-03-04 DIAGNOSIS — E119 Type 2 diabetes mellitus without complications: Secondary | ICD-10-CM

## 2021-03-04 DIAGNOSIS — E785 Hyperlipidemia, unspecified: Secondary | ICD-10-CM | POA: Diagnosis not present

## 2021-03-04 DIAGNOSIS — Z794 Long term (current) use of insulin: Secondary | ICD-10-CM | POA: Diagnosis not present

## 2021-03-04 DIAGNOSIS — I130 Hypertensive heart and chronic kidney disease with heart failure and stage 1 through stage 4 chronic kidney disease, or unspecified chronic kidney disease: Secondary | ICD-10-CM

## 2021-03-12 ENCOUNTER — Telehealth: Payer: Self-pay

## 2021-03-12 NOTE — Chronic Care Management (AMB) (Signed)
    Chronic Care Management Pharmacy Assistant   Name: Yvonne Miller  MRN: [id number] DOB: 31-Mar-1951   Reason for Encounter: 2023 PAP re-enrollment    Medications: Outpatient Encounter Medications as of 03/12/2021  Medication Sig   ACCU-CHEK AVIVA PLUS test strip CHECK 4 TIMES BY INTRADERMAL ROUTE EVERY DAY   albuterol (PROVENTIL HFA;VENTOLIN HFA) 108 (90 Base) MCG/ACT inhaler Inhale 2 puffs into the lungs every 4 (four) hours as needed for wheezing or shortness of breath.    amLODipine (NORVASC) 5 MG tablet TAKE 1 TABLET BY MOUTH  DAILY   aspirin EC 81 MG tablet Take 81 mg by mouth at bedtime.    B Complex Vitamins (B COMPLEX-B12) TABS Take 1 tablet by mouth daily.   carvedilol (COREG) 25 MG tablet Take 1.5 tablets (37.5 mg total) by mouth 2 (two) times daily.   cephALEXin (KEFLEX) 250 MG capsule Take 250 mg by mouth daily.   Cholecalciferol (VITAMIN D) 50 MCG (2000 UT) tablet Take 2,000 Units by mouth daily.   Chromium 1000 MCG TABS Take 1,000 mcg by mouth daily.    Cinnamon 500 MG capsule Take 500 mg by mouth daily.   estradiol (ESTRACE) 0.1 MG/GM vaginal cream Place vaginally.   fluticasone (FLONASE) 50 MCG/ACT nasal spray Place 1 spray into both nostrils daily. (Patient taking differently: Place 1 spray into both nostrils as needed for allergies.)   furosemide (LASIX) 40 MG tablet Take 1 tablet (40 mg total) by mouth every morning. One tab twice daily for 1 week (Patient taking differently: Take 40 mg by mouth every morning.)   hydrALAZINE (APRESOLINE) 50 MG tablet TAKE 1 TABLET BY MOUTH  TWICE DAILY   Insulin Lispro Prot & Lispro (HUMALOG MIX 75/25 KWIKPEN) (75-25) 100 UNIT/ML Kwikpen Inject 45 Units into the skin at bedtime. (Patient taking differently: Inject 60 Units into the skin at bedtime.)   nystatin (NYSTATIN) powder Apply topically 3 (three) times daily as needed. As needed   Omega-3 Fatty Acids (FISH OIL) 500 MG CAPS Take 500 mg by mouth daily.   omeprazole (PRILOSEC) 40  MG capsule Take 40 mg by mouth daily.   pravastatin (PRAVACHOL) 40 MG tablet TAKE 1 TABLET BY MOUTH  DAILY   Probiotic Product (PROBIOTIC PO) Take by mouth.   ROCKLATAN 0.02-0.005 % SOLN SMARTSIG:1 Drop(s) In Eye(s) Every Evening   spironolactone (ALDACTONE) 25 MG tablet TAKE 1 TABLET BY MOUTH IN  THE MORNING   TRULICITY 1.5 ZW/2.5EN SOPN INJECT 1.5 MG INTO THE SKIN ONCE A WEEK. (Patient taking differently: Inject 2 mg as directed every Wednesday.)   valsartan-hydrochlorothiazide (DIOVAN-HCT) 160-25 MG tablet TAKE 1 TABLET BY MOUTH  DAILY   Facility-Administered Encounter Medications as of 03/12/2021  Medication   0.9 %  sodium chloride infusion   03-12-2021: Initiated 2023 patient assistance applications for humalog and trulicity. Left patient voicemail stating applications were mailed to be signed/ returned to office with income verification.  [place] Pharmacist Assistant [phone]

## 2021-03-21 DIAGNOSIS — N390 Urinary tract infection, site not specified: Secondary | ICD-10-CM | POA: Diagnosis not present

## 2021-03-27 ENCOUNTER — Telehealth: Payer: Self-pay

## 2021-03-27 NOTE — Chronic Care Management (AMB) (Addendum)
Chronic Care Management Pharmacy Assistant   Name: Yvonne Miller  MRN: [id number] DOB: 20-Jan-1951   Reason for Encounter: Disease State/ Diabetes    Recent office visits:  02-18-2021 Lynne Logan, RN (CCM)  Recent consult visits:  None  Hospital visits:  Medication Reconciliation was completed by comparing discharge summary, patient's EMR and Pharmacy list, and upon discussion with patient.  Admitted to the hospital on 11-14-2020 due to Acute nonintractable headache, unspecified headache. Discharge date was 11-14-2020. [facility] Started at [facility] Discharge:?? Methocarbamol 500 mg twice daily  Medication Changes at Hospital Discharge: None  Medications Discontinued at Hospital Discharge: None  Medications that remain the same after Hospital Discharge:??  -All other medications will remain the same.    Medications: Outpatient Encounter Medications as of 03/27/2021  Medication Sig   ACCU-CHEK AVIVA PLUS test strip CHECK 4 TIMES BY INTRADERMAL ROUTE EVERY DAY   albuterol (PROVENTIL HFA;VENTOLIN HFA) 108 (90 Base) MCG/ACT inhaler Inhale 2 puffs into the lungs every 4 (four) hours as needed for wheezing or shortness of breath.    amLODipine (NORVASC) 5 MG tablet TAKE 1 TABLET BY MOUTH  DAILY   aspirin EC 81 MG tablet Take 81 mg by mouth at bedtime.    B Complex Vitamins (B COMPLEX-B12) TABS Take 1 tablet by mouth daily.   carvedilol (COREG) 25 MG tablet Take 1.5 tablets (37.5 mg total) by mouth 2 (two) times daily.   cephALEXin (KEFLEX) 250 MG capsule Take 250 mg by mouth daily.   Cholecalciferol (VITAMIN D) 50 MCG (2000 UT) tablet Take 2,000 Units by mouth daily.   Chromium 1000 MCG TABS Take 1,000 mcg by mouth daily.    Cinnamon 500 MG capsule Take 500 mg by mouth daily.   estradiol (ESTRACE) 0.1 MG/GM vaginal cream Place vaginally.   fluticasone (FLONASE) 50 MCG/ACT nasal spray Place 1 spray into both nostrils  daily. (Patient taking differently: Place 1 spray into both nostrils as needed for allergies.)   furosemide (LASIX) 40 MG tablet Take 1 tablet (40 mg total) by mouth every morning. One tab twice daily for 1 week (Patient taking differently: Take 40 mg by mouth every morning.)   hydrALAZINE (APRESOLINE) 50 MG tablet TAKE 1 TABLET BY MOUTH  TWICE DAILY   Insulin Lispro Prot & Lispro (HUMALOG MIX 75/25 KWIKPEN) (75-25) 100 UNIT/ML Kwikpen Inject 45 Units into the skin at bedtime. (Patient taking differently: Inject 60 Units into the skin at bedtime.)   nystatin (NYSTATIN) powder Apply topically 3 (three) times daily as needed. As needed   Omega-3 Fatty Acids (FISH OIL) 500 MG CAPS Take 500 mg by mouth daily.   omeprazole (PRILOSEC) 40 MG capsule Take 40 mg by mouth daily.   pravastatin (PRAVACHOL) 40 MG tablet TAKE 1 TABLET BY MOUTH  DAILY   Probiotic Product (PROBIOTIC PO) Take by mouth.   ROCKLATAN 0.02-0.005 % SOLN SMARTSIG:1 Drop(s) In Eye(s) Every Evening   spironolactone (ALDACTONE) 25 MG tablet TAKE 1 TABLET BY MOUTH IN  THE MORNING   TRULICITY 1.5 IH/0.3UU SOPN INJECT 1.5 MG INTO THE SKIN ONCE A WEEK. (Patient taking differently: Inject 2 mg as directed every Wednesday.)   valsartan-hydrochlorothiazide (DIOVAN-HCT) 160-25 MG tablet TAKE 1 TABLET BY MOUTH  DAILY   Facility-Administered Encounter Medications as of 03/27/2021  Medication   0.9 %  sodium chloride infusion   Recent Relevant Labs: Lab Results  Component Value Date/Time   HGBA1C 7.8 (H) 02/12/2021  09:10 AM   HGBA1C 8.0 (H) 10/10/2020 10:41 AM   MICROALBUR 30 10/10/2020 12:38 PM   MICROALBUR 30 10/05/2019 12:07 PM    Kidney Function Lab Results  Component Value Date/Time   CREATININE 1.49 (H) 02/12/2021 09:10 AM   CREATININE 1.60 (H) 11/14/2020 10:04 AM   GFRNONAA 34 (L) 11/14/2020 10:04 AM   GFRAA 45 (L) 06/21/2020 11:37 AM    Recent Relevant Labs: Lab Results  Component Value Date/Time   HGBA1C 7.8 (H) 02/12/2021  09:10 AM   HGBA1C 8.0 (H) 10/10/2020 10:41 AM   MICROALBUR 30 10/10/2020 12:38 PM   MICROALBUR 30 10/05/2019 12:07 PM    Kidney Function Lab Results  Component Value Date/Time   CREATININE 1.49 (H) 02/12/2021 09:10 AM   CREATININE 1.60 (H) 11/14/2020 10:04 AM   GFRNONAA 34 (L) 11/14/2020 10:04 AM   GFRAA 45 (L) 06/21/2020 11:37 AM    Current antihyperglycemic regimen:  Trulicity 1.5 mg/ 0.5 ml - inject 1.5 mg into the skin once a week Insulin Lispro & Lispro - inject 45 units at bedtime She is doing a sliding scale of 45-60 units, greater than 180-200: 45-60 units  What recent interventions/DTPs have been made to improve glycemic control:  Educated on Complications of diabetes including kidney damage, retinal damage, and cardiovascular disease; Prevention and management of hypoglycemic episodes; -Counseled to check feet daily and get yearly eye exams -Recommended to continue current medication  Have there been any recent hospitalizations or ED visits since last visit with CPP? No  Patient denies hypoglycemic symptoms  Patient denies hyperglycemic symptoms  How often are you checking your blood sugar? once daily, twice daily, and 3-4 times daily  What are your blood sugars ranging?  Fasting: 97, 127 Before meals: None After meals: 157 Bedtime: 154  During the week, how often does your blood glucose drop below 70? Never  Are you checking your feet daily/regularly? Daily  Adherence Review: Is the patient currently on a STATIN medication? No Is the patient currently on ACE/ARB medication? Yes Does the patient have >5 day gap between last estimated fill dates? No  NOTES: Patient dropped off 2023 patient assistance application to the office today.  Care Gaps: Shingrix Covid booster overdue AWV 07-03-2021  Star Rating Drugs: Valsartan- Hydrochlorothiazide 160-25 mg- Last filled [id number] DS optum Trulicity 1.5 VO/3.6 ml- Patient assistance  [place] Pharmacist Assistant [phone]

## 2021-04-15 ENCOUNTER — Other Ambulatory Visit: Payer: Self-pay

## 2021-04-15 DIAGNOSIS — I5042 Chronic combined systolic (congestive) and diastolic (congestive) heart failure: Secondary | ICD-10-CM

## 2021-04-15 MED ORDER — FUROSEMIDE 40 MG PO TABS: 40.0000 mg | ORAL_TABLET | ORAL | 0 refills | Status: DC

## 2021-04-22 ENCOUNTER — Telehealth: Payer: Self-pay

## 2021-04-22 ENCOUNTER — Telehealth: Payer: Medicare Other

## 2021-04-22 NOTE — Telephone Encounter (Signed)
°  Care Management   Follow Up Note   04/22/2021 Name: Yvonne Miller MRN: [id number] DOB: 23-Feb-1951   Referred by: [name], MD Reason for referral : Chronic Care Management (RN CM Follow up call )   An unsuccessful telephone outreach was attempted today. The patient was referred to the case management team for assistance with care management and care coordination.   Follow Up Plan: A HIPPA compliant phone message was left for the patient providing contact information and requesting a return call.   Barb Merino, RN, BSN, CCM Care Management Coordinator [facility]  Direct Phone: [phone]

## 2021-05-01 ENCOUNTER — Ambulatory Visit (INDEPENDENT_AMBULATORY_CARE_PROVIDER_SITE_OTHER): Payer: Medicare Other

## 2021-05-01 ENCOUNTER — Telehealth: Payer: Medicare Other

## 2021-05-01 DIAGNOSIS — E559 Vitamin D deficiency, unspecified: Secondary | ICD-10-CM

## 2021-05-01 DIAGNOSIS — N1832 Chronic kidney disease, stage 3b: Secondary | ICD-10-CM

## 2021-05-01 DIAGNOSIS — I130 Hypertensive heart and chronic kidney disease with heart failure and stage 1 through stage 4 chronic kidney disease, or unspecified chronic kidney disease: Secondary | ICD-10-CM

## 2021-05-01 DIAGNOSIS — E785 Hyperlipidemia, unspecified: Secondary | ICD-10-CM

## 2021-05-01 DIAGNOSIS — I5032 Chronic diastolic (congestive) heart failure: Secondary | ICD-10-CM

## 2021-05-01 DIAGNOSIS — E1122 Type 2 diabetes mellitus with diabetic chronic kidney disease: Secondary | ICD-10-CM

## 2021-05-01 NOTE — Chronic Care Management (AMB) (Signed)
Chronic Care Management   CCM RN Visit Note  05/01/2021 Name: Yvonne Miller MRN: [id number] DOB: Jul 13, 1950  Subjective: Yvonne Miller is a 70 y.o. year old female who is a primary care patient of [name], MD. The care management team was consulted for assistance with disease management and care coordination needs.    Engaged with patient by telephone for follow up visit in response to provider referral for case management and/or care coordination services.   Consent to Services:  The patient was given information about Chronic Care Management services, agreed to services, and gave verbal consent prior to initiation of services.  Please see initial visit note for detailed documentation.   Patient agreed to services and verbal consent obtained.   Assessment: Review of patient past medical history, allergies, medications, health status, including review of consultants reports, laboratory and other test data, was performed as part of comprehensive evaluation and provision of chronic care management services.   SDOH (Social Determinants of Health) assessments and interventions performed:  Yes, no acute challenges  CCM Care Plan  Allergies  Allergen Reactions   Other Other (See Comments)    NO BLOOD   . Jehovah witness    Outpatient Encounter Medications as of 05/01/2021  Medication Sig   ACCU-CHEK AVIVA PLUS test strip CHECK 4 TIMES BY INTRADERMAL ROUTE EVERY DAY   albuterol (PROVENTIL HFA;VENTOLIN HFA) 108 (90 Base) MCG/ACT inhaler Inhale 2 puffs into the lungs every 4 (four) hours as needed for wheezing or shortness of breath.    amLODipine (NORVASC) 5 MG tablet TAKE 1 TABLET BY MOUTH  DAILY   aspirin EC 81 MG tablet Take 81 mg by mouth at bedtime.    B Complex Vitamins (B COMPLEX-B12) TABS Take 1 tablet by mouth daily.   carvedilol (COREG) 25 MG tablet Take 1.5 tablets (37.5 mg total) by mouth 2 (two) times daily.   cephALEXin (KEFLEX) 250 MG capsule Take 250 mg by mouth  daily.   Cholecalciferol (VITAMIN D) 50 MCG (2000 UT) tablet Take 2,000 Units by mouth daily.   Chromium 1000 MCG TABS Take 1,000 mcg by mouth daily.    Cinnamon 500 MG capsule Take 500 mg by mouth daily.   estradiol (ESTRACE) 0.1 MG/GM vaginal cream Place vaginally.   fluticasone (FLONASE) 50 MCG/ACT nasal spray Place 1 spray into both nostrils daily. (Patient taking differently: Place 1 spray into both nostrils as needed for allergies.)   furosemide (LASIX) 40 MG tablet Take 1 tablet (40 mg total) by mouth every morning.   hydrALAZINE (APRESOLINE) 50 MG tablet TAKE 1 TABLET BY MOUTH  TWICE DAILY   Insulin Lispro Prot & Lispro (HUMALOG MIX 75/25 KWIKPEN) (75-25) 100 UNIT/ML Kwikpen Inject 45 Units into the skin at bedtime. (Patient taking differently: Inject 60 Units into the skin at bedtime.)   nystatin (NYSTATIN) powder Apply topically 3 (three) times daily as needed. As needed   Omega-3 Fatty Acids (FISH OIL) 500 MG CAPS Take 500 mg by mouth daily.   omeprazole (PRILOSEC) 40 MG capsule Take 40 mg by mouth daily.   pravastatin (PRAVACHOL) 40 MG tablet TAKE 1 TABLET BY MOUTH  DAILY   Probiotic Product (PROBIOTIC PO) Take by mouth.   ROCKLATAN 0.02-0.005 % SOLN SMARTSIG:1 Drop(s) In Eye(s) Every Evening   spironolactone (ALDACTONE) 25 MG tablet TAKE 1 TABLET BY MOUTH IN  THE MORNING   TRULICITY 1.5 QG/9.2EF SOPN INJECT 1.5 MG INTO THE SKIN ONCE A WEEK. (Patient taking differently: Inject 2 mg as  directed every Wednesday.)   valsartan-hydrochlorothiazide (DIOVAN-HCT) 160-25 MG tablet TAKE 1 TABLET BY MOUTH  DAILY   Facility-Administered Encounter Medications as of 05/01/2021  Medication   0.9 %  sodium chloride infusion    Patient Active Problem List   Diagnosis Date Noted   Adrenal nodule ([place]) 02/12/2021   Recurrent UTI 10/10/2020   Class 3 severe obesity due to excess calories with serious comorbidity and body mass index (BMI) of 45.0 to 49.9 in adult [facility]) 10/10/2020   Non-ischemic  cardiomyopathy ([place]) 02/18/2019   Encounter for assessment of implantable cardioverter-defibrillator (ICD)    Type 2 diabetes mellitus with stage 3 chronic kidney disease, with long-term current use of insulin ([place]) 03/04/2018   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   Chronic combined systolic and diastolic congestive heart failure ([place]) 03/04/2018   Chronic renal disease, stage II 03/04/2018   Status post revision of total replacement of left knee 01/26/2018   Unilateral primary osteoarthritis, left knee 08/18/2017   Biventricular automatic implantable cardioverter defibrillator in situ 12/13/2013   ICD -  Biventricular  [employer] Dynagen X4 CRT-D Model G158 ICD in place 10/28/13 10/28/2013   HTN (hypertension) 10/03/2013   Hyperlipemia 10/03/2013   LBBB (left bundle branch block) 10/03/2013   Cardiomyopathy ([place]) 10/03/2013   DM (diabetes mellitus) ([place]) 10/03/2013    Conditions to be addressed/monitored: DM type 2, HTN, HLD, CKD, CHF, Vitamin D deficiency  Care Plan : [place] of Care  Updates made by Lynne Logan, RN since 05/01/2021 12:00 AM     Problem: No plan established for management of chronic disease states (DM type 2, HTN, HLD, CKD, CHF, Vitamin D deficiency)   Priority: High     Long-Range Goal: Establishment of plan of care for management of chronic disease conditions DM type 2, HTN, HLD, CKD, CHF, Vitamin D deficiency   Start Date: 05/01/2021  Expected End Date: 05/01/2022  This Visit's Progress: On track  Priority: High  Note:   Current Barriers:  Knowledge Deficits related to plan of care for management of DM type 2, HTN, HLD, CKD, CHF, Vitamin D deficiency  Chronic Disease Management support and education needs related to DM type 2, HTN, HLD, CKD, CHF, Vitamin D deficiency   RNCM Clinical Goal(s):  Patient will verbalize basic understanding of  DM type 2, HTN, HLD, CKD, CHF, Vitamin D deficiency disease process and self health  management plan as evidenced by patient will report having no disease exacerbations related to her chronic disease states as listed above take all medications exactly as prescribed and will call provider for medication related questions as evidenced by patient will report having no missed doses of her prescribed medications demonstrate Improved health management independence as evidenced by patient will report 100% adherence to her prescribed treatment plan  continue to work with RN Care Manager to address care management and care coordination needs related to  DM type 2, HTN, HLD, CKD, CHF, Vitamin D deficiency as evidenced by adherence to CM Team Scheduled appointments demonstrate ongoing self health care management ability   as evidenced by    through collaboration with RN Care manager, provider, and care team.   Interventions: 1:1 collaboration with primary care provider regarding development and update of comprehensive plan of care as evidenced by provider attestation and co-signature Inter-disciplinary care team collaboration (see longitudinal plan of care) Evaluation of current treatment plan related to  self management and patient's adherence to plan as established by provider  Heart Failure Interventions:  (Status:  Condition stable.  Not addressed this visit.) Long Term Goal Reviewed Cardiology f/u completed with Dr. Rockwell [place] on 12/28/20 with the following Assessment/Plan noted: No changes in the medications were done today.  She does have underlying stage IIIa to stage IIIb chronic kidney disease, in view of this I would recommend repeating her BMP, she is seeing Dr. [name] next month and this can be done then.  Otherwise she is stable from cardiac standpoint, I will see her back in 6 months for follow-up.   Reinforced importance to adhere to dietary and exercise recommendations  Reinforced importance of daily weight Discussed plans with patient for ongoing care management follow up  and provided patient with direct contact information for care management team  Diabetes Interventions:  (Status:  Goal on track:  Yes.) Long Term Goal Assessed patient's understanding of A1c goal: <6.5% Provided education to patient about basic DM disease process Reviewed medications with patient and discussed importance of medication adherence Counseled on importance of regular laboratory monitoring as prescribed Advised patient, providing education and rationale, to check cbg daily before meals and at bedtime and record, calling PCP and or RN CM for findings outside established parameters Review of patient status, including review of consultants reports, relevant laboratory and other test results, and medications completed Discussed plans with patient for ongoing care management follow up and provided patient with direct contact information for care management team Lab Results  Component Value Date   HGBA1C 7.8 (H) 02/12/2021   Impaired Urinary Elimination:  (Status:  Goal on track:  Yes.)  Long Term Goal Evaluation of current treatment plan related to  Impaired Urinary Elimination , self-management and patient's adherence to plan as established by provider Review of patient status, including review of consultant's reports, relevant laboratory and other test results, and medications completed Reviewed medications with patient and discussed importance of medication adherence Reviewed and discussed signs/symptoms of UTI and when to seek medical attention if symptoms reoccur Reinforced the importance of increasing daily water intake to 64 oz daily unless otherwise directed Discussed plans with patient for ongoing care management follow up and provided patient with direct contact information for care management team   Hypertension Interventions:  (Status:  Goal on track:  Yes.) Long Term Goal Last practice recorded BP readings:  BP Readings from Last 3 Encounters:  02/12/21 134/72  01/16/21  140/71  01/14/21 117/78  Most recent eGFR/CrCl:  Lab Results  Component Value Date   EGFR 37 (L) 02/12/2021    No components found for: CRCL  Evaluation of current treatment plan related to hypertension self management and patient's adherence to plan as established by provider Reviewed medications with patient and discussed importance of compliance Counseled on the importance of exercise goals with target of 150 minutes per week Advised patient, providing education and rationale, to monitor blood pressure daily and record, calling PCP for findings outside established parameters Provided education on prescribed diet low Sodium Discussed plans with patient for ongoing care management follow up and provided patient with direct contact information for care management team    Chronic Kidney Disease Interventions:  (Status:  Goal on track:  Yes.) Long Term Goal Assessed the Patient understanding of chronic kidney disease    Reviewed prescribed diet increase daily water intake to 64 oz daily unless otherwise directed by Nephrology Provided education on kidney disease progression    Discussed plans with patient for ongoing care management follow up and provided patient with direct  contact information for care management team Last practice recorded BP readings:  BP Readings from Last 3 Encounters:  02/12/21 134/72  01/16/21 140/71  01/14/21 117/78  Most recent eGFR/CrCl:  Lab Results  Component Value Date   EGFR 37 (L) 02/12/2021    No components found for: CRCL   Patient Goals/Self-Care Activities: Take all medications as prescribed Attend all scheduled provider appointments Call pharmacy for medication refills 3-7 days in advance of running out of medications Perform all self care activities independently  Perform IADL's (shopping, preparing meals, housekeeping, managing finances) independently Call provider office for new concerns or questions  drink 6 to 8 glasses of water each  day manage portion size call doctor for signs and symptoms of high blood pressure take medications for blood pressure exactly as prescribed report new symptoms to your doctor adhere to prescribed diet: low fat  Follow Up Plan:  Telephone follow up appointment with care management team member scheduled for:  06/24/21      Plan:Telephone follow up appointment with care management team member scheduled for:  06/24/21  Barb Merino, RN, BSN, CCM Care Management Coordinator [place] [facility]  Direct Phone: [phone]

## 2021-05-01 NOTE — Patient Instructions (Signed)
Visit Information  Thank you for taking time to visit with me today. Please don't hesitate to contact me if I can be of assistance to you before our next scheduled telephone appointment.  Following are the goals we discussed today:  (Copy and paste patient goals from clinical care plan here)  Our next appointment is by telephone on 06/24/21 at 11:50 AM  Please call the care guide team at [phone] if you need to cancel or reschedule your appointment.   If you are experiencing a Mental Health or [place] or need someone to talk to, please call [phone]-TALK (toll free, 24 hour hotline)   Patient verbalizes understanding of instructions provided today and agrees to view in [place].   Barb Merino, RN, BSN, CCM Care Management Coordinator [place] [facility]  Direct Phone: [phone]

## 2021-05-04 DIAGNOSIS — I130 Hypertensive heart and chronic kidney disease with heart failure and stage 1 through stage 4 chronic kidney disease, or unspecified chronic kidney disease: Secondary | ICD-10-CM

## 2021-05-04 DIAGNOSIS — E1122 Type 2 diabetes mellitus with diabetic chronic kidney disease: Secondary | ICD-10-CM | POA: Diagnosis not present

## 2021-05-04 DIAGNOSIS — N1832 Chronic kidney disease, stage 3b: Secondary | ICD-10-CM

## 2021-05-04 DIAGNOSIS — E785 Hyperlipidemia, unspecified: Secondary | ICD-10-CM | POA: Diagnosis not present

## 2021-05-04 DIAGNOSIS — I5032 Chronic diastolic (congestive) heart failure: Secondary | ICD-10-CM

## 2021-05-04 DIAGNOSIS — Z794 Long term (current) use of insulin: Secondary | ICD-10-CM

## 2021-05-09 ENCOUNTER — Encounter: Payer: Self-pay | Admitting: Internal Medicine

## 2021-05-28 ENCOUNTER — Telehealth: Payer: Self-pay

## 2021-05-28 NOTE — Chronic Care Management (AMB) (Signed)
Chronic Care Management Pharmacy Assistant   Name: Yvonne Miller  MRN: [id number] DOB: [birth year]  Reason for Encounter: Disease State/ Diabetes  Recent office visits:  05-01-2021 Lynne Logan, RN (CCM)  02-18-2021 Little, Claudette Stapler, RN (CCM)  Recent consult visits:  03-21-2021 Rosalio Macadamia, MD (Urogynecology)  Hospital visits:  None in previous 6 months  Medications: Outpatient Encounter Medications as of 05/28/2021  Medication Sig   ACCU-CHEK AVIVA PLUS test strip CHECK 4 TIMES BY INTRADERMAL ROUTE EVERY DAY   albuterol (PROVENTIL HFA;VENTOLIN HFA) 108 (90 Base) MCG/ACT inhaler Inhale 2 puffs into the lungs every 4 (four) hours as needed for wheezing or shortness of breath.    amLODipine (NORVASC) 5 MG tablet TAKE 1 TABLET BY MOUTH  DAILY   aspirin EC 81 MG tablet Take 81 mg by mouth at bedtime.    B Complex Vitamins (B COMPLEX-B12) TABS Take 1 tablet by mouth daily.   carvedilol (COREG) 25 MG tablet Take 1.5 tablets (37.5 mg total) by mouth 2 (two) times daily.   cephALEXin (KEFLEX) 250 MG capsule Take 250 mg by mouth daily.   Cholecalciferol (VITAMIN D) 50 MCG (2000 UT) tablet Take 2,000 Units by mouth daily.   Chromium 1000 MCG TABS Take 1,000 mcg by mouth daily.    Cinnamon 500 MG capsule Take 500 mg by mouth daily.   estradiol (ESTRACE) 0.1 MG/GM vaginal cream Place vaginally.   fluticasone (FLONASE) 50 MCG/ACT nasal spray Place 1 spray into both nostrils daily. (Patient taking differently: Place 1 spray into both nostrils as needed for allergies.)   furosemide (LASIX) 40 MG tablet Take 1 tablet (40 mg total) by mouth every morning.   hydrALAZINE (APRESOLINE) 50 MG tablet TAKE 1 TABLET BY MOUTH  TWICE DAILY   Insulin Lispro Prot & Lispro (HUMALOG MIX 75/25 KWIKPEN) (75-25) 100 UNIT/ML Kwikpen Inject 45 Units into the skin at bedtime. (Patient taking differently: Inject 60 Units into the skin at bedtime.)   nystatin (NYSTATIN) powder Apply topically 3 (three)  times daily as needed. As needed   Omega-3 Fatty Acids (FISH OIL) 500 MG CAPS Take 500 mg by mouth daily.   omeprazole (PRILOSEC) 40 MG capsule Take 40 mg by mouth daily.   pravastatin (PRAVACHOL) 40 MG tablet TAKE 1 TABLET BY MOUTH  DAILY   Probiotic Product (PROBIOTIC PO) Take by mouth.   ROCKLATAN 0.02-0.005 % SOLN SMARTSIG:1 Drop(s) In Eye(s) Every Evening   spironolactone (ALDACTONE) 25 MG tablet TAKE 1 TABLET BY MOUTH IN  THE MORNING   TRULICITY 1.5 KX/3.8HW SOPN INJECT 1.5 MG INTO THE SKIN ONCE A WEEK. (Patient taking differently: Inject 2 mg as directed every Wednesday.)   valsartan-hydrochlorothiazide (DIOVAN-HCT) 160-25 MG tablet TAKE 1 TABLET BY MOUTH  DAILY   Facility-Administered Encounter Medications as of 05/28/2021  Medication   0.9 %  sodium chloride infusion  Recent Relevant Labs: Lab Results  Component Value Date/Time   HGBA1C 7.8 (H) 02/12/2021 09:10 AM   HGBA1C 8.0 (H) 10/10/2020 10:41 AM   MICROALBUR 30 10/10/2020 12:38 PM   MICROALBUR 30 10/05/2019 12:07 PM    Kidney Function Lab Results  Component Value Date/Time   CREATININE 1.49 (H) 02/12/2021 09:10 AM   CREATININE 1.60 (H) 11/14/2020 10:04 AM   GFRNONAA 34 (L) 11/14/2020 10:04 AM   GFRAA 45 (L) 06/21/2020 11:37 AM    05-28-2021: 1st attempt left VM 01-25--2023: 2nd attempt left VM 05-30-2021: 3rd attempt left VM  Care Gaps: Shingrix overdue Covid booster  overdue PNA Vac overdue  Star Rating Drugs: Valsartan/HCTZ 160-25 mg- Last filled [id number] 90 DS [facility] Trulicity 2 mg- Patient assistance Pravastatin 40 mg- Last filled 03-09-2021 90 DS [place] Clinical Pharmacist Assistant [phone]

## 2021-05-30 ENCOUNTER — Other Ambulatory Visit: Payer: Self-pay | Admitting: Internal Medicine

## 2021-06-04 ENCOUNTER — Telehealth: Payer: Self-pay

## 2021-06-04 NOTE — Chronic Care Management (AMB) (Signed)
° ° °  Called Yvonne Miller, No answer, left message of appointment on 06-05-2021 at 12:00 via telephone visit with [name], Pharm D. Notified to have all medications, supplements, blood pressure and/or blood sugar logs available during appointment and to return call if need to reschedule.  Care Gaps: Shingrix overdue Covid booster overdue PNA Vac overdue AWV 07-03-2021  Star Rating Drug: Valsartan/HCTZ 160-25 mg- Last filled [id number] 90 DS [facility] Trulicity 2 mg- Patient assistance Pravastatin 40 mg- Last filled 03-09-2021 90 DS [facility]  Any gaps in medications fill history? No  [place] Pharmacist Assistant [phone]

## 2021-06-05 ENCOUNTER — Ambulatory Visit (INDEPENDENT_AMBULATORY_CARE_PROVIDER_SITE_OTHER): Payer: Medicare Other

## 2021-06-05 DIAGNOSIS — I130 Hypertensive heart and chronic kidney disease with heart failure and stage 1 through stage 4 chronic kidney disease, or unspecified chronic kidney disease: Secondary | ICD-10-CM

## 2021-06-05 DIAGNOSIS — N1832 Chronic kidney disease, stage 3b: Secondary | ICD-10-CM

## 2021-06-05 DIAGNOSIS — Z794 Long term (current) use of insulin: Secondary | ICD-10-CM

## 2021-06-05 NOTE — Progress Notes (Signed)
Chronic Care Management Pharmacy Note  06/12/2021 Name:  Yvonne Miller MRN:  [id number] DOB:  10-Feb-1951  Summary: Patient reports that she has been doing better and taking her medication everyday.   Recommendations/Changes made from today's visit: Recommend patient be prescribed the GVOKE pen. Recommend patient receive the pneumonia vaccine during next office visit.   Plan: Patient to receive pneumonia vaccine during next office visit.    Subjective: Yvonne Miller is an 71 y.o. year old female who is a primary patient of [name], MD.  The CCM team was consulted for assistance with disease management and care coordination needs.    Engaged with patient by telephone for follow up visit in response to provider referral for pharmacy case management and/or care coordination services.   Consent to Services:  The patient was given information about Chronic Care Management services, agreed to services, and gave verbal consent prior to initiation of services.  Please see initial visit note for detailed documentation.   Patient Care Team: [name], MD as PCP - General (Internal Medicine) Riley Churches, RN as Case Manager Harlan Stains, [facility] (Pharmacist)  Recent office visits: 02/12/2021 PCP OV 02/01/2021 PCP OV   Recent consult visits: 12/28/2020 Cardiology OV 11/22/2020 Nutrition OV  Hospital visits: None in previous 6 months   Objective:  Lab Results  Component Value Date   CREATININE 1.49 (H) 02/12/2021   BUN 27 02/12/2021   EGFR 37 (L) 02/12/2021   GFRNONAA 34 (L) 11/14/2020   GFRAA 45 (L) 06/21/2020   NA 138 02/12/2021   K 4.9 02/12/2021   CALCIUM 9.5 02/12/2021   CO2 28 02/12/2021   GLUCOSE 161 (H) 02/12/2021    Lab Results  Component Value Date/Time   HGBA1C 7.8 (H) 02/12/2021 09:10 AM   HGBA1C 8.0 (H) 10/10/2020 10:41 AM   MICROALBUR 30 10/10/2020 12:38 PM   MICROALBUR 30 10/05/2019 12:07 PM    Last diabetic Eye exam:  Lab Results   Component Value Date/Time   HMDIABEYEEXA No Retinopathy 12/04/2020 12:00 AM    Last diabetic Foot exam: No results found for: HMDIABFOOTEX   Lab Results  Component Value Date   CHOL 139 02/12/2021   HDL 40 02/12/2021   LDLCALC 67 02/12/2021   TRIG 194 (H) 02/12/2021   CHOLHDL 3.5 02/12/2021    Hepatic Function Latest Ref Rng & Units 02/12/2021 10/10/2020 09/25/2020  Total Protein 6.0 - 8.5 g/dL 7.0 7.6 7.7  Albumin 3.7 - 4.7 g/dL 4.2 4.5 -  AST 0 - 40 IU/L 13 19 -  ALT 0 - 32 IU/L 10 15 -  Alk Phosphatase 44 - 121 IU/L 109 105 -  Total Bilirubin 0.0 - 1.2 mg/dL 0.2 0.2 -  Bilirubin, Direct 0.00 - 0.40 mg/dL - <0.95 -    Lab Results  Component Value Date/Time   TSH 1.110 06/21/2020 11:37 AM   TSH 1.375  04/21/2009 06:57 PM    CBC Latest Ref Rng & Units 11/14/2020 06/21/2020 10/05/2019  WBC 4.0 - 10.5 K/uL 10.1 10.5 10.6  Hemoglobin 12.0 - 15.0 g/dL 73.6 11.6 08.8  Hematocrit 36.0 - 46.0 % 37.7 36.8 36.1  Platelets 150 - 400 K/uL 300 333 328    Lab Results  Component Value Date/Time   VD25OH 39.8 02/12/2021 09:10 AM    Clinical ASCVD: No  The 10-year ASCVD risk score (Arnett DK, et al., 2019) is: 37.8%   Values used to calculate the score:     Age: 22 years  Sex: Female     Is Non-Hispanic African American: Yes     Diabetic: Yes     Tobacco smoker: Yes     Systolic Blood Pressure: 161 mmHg     Is BP treated: Yes     HDL Cholesterol: 40 mg/dL     Total Cholesterol: 139 mg/dL    Depression screen [facility] 2/9 11/22/2020 06/21/2020 10/05/2019  Decreased Interest 0 0 0  Down, Depressed, Hopeless 0 0 0  PHQ - 2 Score 0 0 0  Altered sleeping - - -  Tired, decreased energy - - -  Change in appetite - - -  Feeling bad or failure about yourself  - - -  Trouble concentrating - - -  Moving slowly or fidgety/restless - - -  Suicidal thoughts - - -  PHQ-9 Score - - -  Difficult doing work/chores - - -  Some recent data might be hidden     Social History   Tobacco Use   Smoking Status Never  Smokeless Tobacco Never   BP Readings from Last 3 Encounters:  02/12/21 134/72  01/16/21 140/71  01/14/21 117/78   Pulse Readings from Last 3 Encounters:  02/12/21 80  01/16/21 69  12/28/20 69   Wt Readings from Last 3 Encounters:  02/12/21 271 lb 9.6 oz (123.2 kg)  12/28/20 273 lb (123.8 kg)  11/22/20 278 lb (126.1 kg)   BMI Readings from Last 3 Encounters:  02/12/21 48.42 kg/m  12/28/20 46.86 kg/m  11/22/20 47.72 kg/m    Assessment/Interventions: Review of patient past medical history, allergies, medications, health status, including review of consultants reports, laboratory and other test data, was performed as part of comprehensive evaluation and provision of chronic care management services.   SDOH:  (Social Determinants of Health) assessments and interventions performed: No  SDOH Screenings   Alcohol Screen: Not on file  Depression (PHQ2-9): Low Risk    PHQ-2 Score: 0  Financial Resource Strain: Low Risk    Difficulty of Paying Living Expenses: Not hard at all  Food Insecurity: No Food Insecurity   Worried About Charity fundraiser in the Last Year: Never true   Ran Out of Food in the Last Year: Never true  Housing: Not on file  Physical Activity: Insufficiently Active   Days of Exercise per Week: 3 days   Minutes of Exercise per Session: 30 min  Social Connections: Not on file  Stress: No Stress Concern Present   Feeling of Stress : Not at all  Tobacco Use: Low Risk    Smoking Tobacco Use: Never   Smokeless Tobacco Use: Never   Passive Exposure: Not on file  Transportation Needs: No Transportation Needs   Lack of Transportation (Medical): No   Lack of Transportation (Non-Medical): No    CCM Care Plan  Allergies  Allergen Reactions   Other Other (See Comments)    NO BLOOD   . Jehovah witness    Medications Reviewed Today     Reviewed by Lynne Logan, RN (Registered Nurse) on 05/01/21 at 1134  Med List Status: <None>    Medication Order Taking? Sig Documenting Provider Last Dose Status Informant  0.9 %  sodium chloride infusion [id number]   Minette Brine, FNP  Active   ACCU-CHEK AVIVA PLUS test strip [id number] No CHECK 4 TIMES BY INTRADERMAL ROUTE EVERY DAY [name], MD Taking Active   albuterol (PROVENTIL HFA;VENTOLIN HFA) 108 (90 Base) MCG/ACT inhaler [id number] No Inhale 2 puffs into the lungs every  4 (four) hours as needed for wheezing or shortness of breath.  [provider] Taking Active Self  amLODipine (NORVASC) 5 MG tablet [id number] No TAKE 1 TABLET BY MOUTH  DAILY Adrian Prows, MD Taking Active   aspirin EC 81 MG tablet [id number] No Take 81 mg by mouth at bedtime.  [provider] Taking Active Self           Med Note Michelle Nasuti   Thu Jun 16, 2019  8:38 AM)    B Complex Vitamins (B COMPLEX-B12) TABS [id number] No Take 1 tablet by mouth daily. [provider] Taking Active Self           Med Note Jannifer Rodney, APRIL   Thu Oct 21, 2018  8:17 AM)    carvedilol (COREG) 25 MG tablet [id number] No Take 1.5 tablets (37.5 mg total) by mouth 2 (two) times daily. Adrian Prows, MD Taking Active   cephALEXin (KEFLEX) 250 MG capsule [id number]  Take 250 mg by mouth daily. [provider]  Active   Cholecalciferol (VITAMIN D) 50 MCG (2000 UT) tablet [id number] No Take 2,000 Units by mouth daily. [provider] Taking Active   Chromium 1000 MCG TABS [id number] No Take 1,000 mcg by mouth daily.  [provider] Taking Active   Cinnamon 500 MG capsule [id number] No Take 500 mg by mouth daily. [provider] Taking Active Self           Med Note Jannifer Rodney, APRIL   Thu Oct 21, 2018  8:17 AM)    estradiol (ESTRACE) 0.1 MG/GM vaginal cream [id number]  Place vaginally. [provider]  Active   fluticasone (FLONASE) 50 MCG/ACT nasal spray [id number] No Place 1 spray into both nostrils daily.  Patient taking differently: Place 1 spray into both  nostrils as needed for allergies.   [name], MD Taking Expired 11/22/20 2359   furosemide (LASIX) 40 MG tablet [id number]  Take 1 tablet (40 mg total) by mouth every morning. Adrian Prows, MD  Active   hydrALAZINE (APRESOLINE) 50 MG tablet [id number] No TAKE 1 TABLET BY MOUTH  TWICE DAILY Adrian Prows, MD Taking Active   Insulin Lispro Prot & Lispro (HUMALOG MIX 75/25 KWIKPEN) (75-25) 100 UNIT/ML Claiborne Rigg [id number] No Inject 45 Units into the skin at bedtime.  Patient taking differently: Inject 60 Units into the skin at bedtime.   [name], MD Taking Active   nystatin (NYSTATIN) powder [id number] No Apply topically 3 (three) times daily as needed. As needed [name], MD Taking Active   Omega-3 Fatty Acids (FISH OIL) 500 MG CAPS [id number] No Take 500 mg by mouth daily. [provider] Taking Active   omeprazole (PRILOSEC) 40 MG capsule [id number] No Take 40 mg by mouth daily. [provider] Taking Active   pravastatin (PRAVACHOL) 40 MG tablet [id number] No TAKE 1 TABLET BY MOUTH  DAILY [name], MD Taking Active   Probiotic Product (PROBIOTIC PO) [id number] No Take by mouth. [provider] Taking Active   ROCKLATAN 0.02-0.005 % SOLN [id number] No SMARTSIG:1 Drop(s) In Eye(s) Every Evening [provider] Taking Active   spironolactone (ALDACTONE) 25 MG tablet [id number] No TAKE 1 TABLET BY MOUTH IN  THE Alfonso Patten, MD Taking Active   TRULICITY 1.5 TO/6.7TI SOPN [id number] No INJECT 1.5 MG INTO THE SKIN ONCE A WEEK.  Patient taking differently: Inject 2 mg as directed every Wednesday.   [name], MD Taking Active   valsartan-hydrochlorothiazide (DIOVAN-HCT) 160-25 MG tablet [id number] No  TAKE 1 TABLET BY MOUTH  DAILY [name], MD Taking Active             Patient Active Problem List   Diagnosis Date Noted   Adrenal nodule ([place]) 02/12/2021   Recurrent UTI 10/10/2020   Class 3 severe obesity due to excess calories with  serious comorbidity and body mass index (BMI) of 45.0 to 49.9 in adult [facility]) 10/10/2020   Non-ischemic cardiomyopathy ([place]) 02/18/2019   Encounter for assessment of implantable cardioverter-defibrillator (ICD)    Type 2 diabetes mellitus with stage 3 chronic kidney disease, with long-term current use of insulin ([place]) 03/04/2018   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   Chronic combined systolic and diastolic congestive heart failure ([place]) 03/04/2018   Chronic renal disease, stage II 03/04/2018   Status post revision of total replacement of left knee 01/26/2018   Unilateral primary osteoarthritis, left knee 08/18/2017   Biventricular automatic implantable cardioverter defibrillator in situ 12/13/2013   ICD -  Biventricular  [employer] Dynagen X4 CRT-D Model G158 ICD in place 10/28/13 10/28/2013   HTN (hypertension) 10/03/2013   Hyperlipemia 10/03/2013   LBBB (left bundle branch block) 10/03/2013   Cardiomyopathy ([place]) 10/03/2013   DM (diabetes mellitus) ([place]) 10/03/2013    Immunization History  Administered Date(s) Administered   Fluad Quad(high Dose 65+) 02/07/2020, 02/12/2021   Hepatitis A 01/19/2012, 02/16/2012   Hepatitis B 01/19/2012, 02/16/2012   IPV 02/16/2012   Influenza, High Dose Seasonal PF 02/17/2016, 03/04/2018, 02/10/2019   Influenza-Unspecified 02/02/2014, 02/16/2014   MMR 04/04/1994   PFIZER(Purple Top)SARS-COV-2 Vaccination 06/11/2019, 07/02/2019, 02/21/2020   Pneumococcal Conjugate-13 05/16/2020   Td 09/08/2006   Tdap 01/19/2012, 06/21/2020   Zoster, Live 12/15/2007    Conditions to be addressed/monitored:  Hypertension and Diabetes  Care Plan : [place]  Updates made by [name], [place] since 06/12/2021 12:00 AM     Problem: HLD, DM II   Priority: High     Long-Range Goal: Disease Management   Start Date: 07/12/2020  Recent Progress: On track  Priority: High  Note:   Current Barriers:  Unable to independently afford  treatment regimen  Pharmacist Clinical Goal(s):  Patient will achieve control of Diabetes as evidenced by A1c<7 through collaboration with PharmD and provider.   Interventions: 1:1 collaboration with [name], MD regarding development and update of comprehensive plan of care as evidenced by provider attestation and co-signature Inter-disciplinary care team collaboration (see longitudinal plan of care) Comprehensive medication review performed; medication list updated in electronic medical record  Hypertension (BP goal <130/80) -Controlled -Current treatment: Valsartan-Hydrochlorothiazide 160-25 mg tablet once per day Appropriate, Effective, Safe, Accessible Hydralazine 50 mg tablet taking it twice per day  Appropriate, Effective, Safe, Accessible Carvedilol 25 mg tablet - taking 1.5 tablet by mouth twice  Appropriate, Effective, Safe, Accessible -Current home readings: she is checking it three times per week 120/78,  -Current dietary habits: she is not using any salt at this time -Current exercise habits: please see diabetes -Denies hypotensive/hypertensive symptoms -Educated on Exercise goal of 150 minutes per week; Importance of home blood pressure monitoring; Proper BP monitoring technique; -Counseled to monitor BP at home monitoring three times per week, document, and provide log at future appointments -Recommended to continue current medication  Diabetes (A1c goal <7%) -Not ideally controlled -Current medications: Trulicity 1.5 MO/2.9 ml - inject 1.5 mg once weekly   Appropriate, Effective, Safe, Accessible Humalog mix-inject 45 units into the skin at bedtime  Appropriate, Effective, Safe,  Accessible -Current home glucose readings : she has had some low BS readings  fasting glucose: Last week she had one low BS reading last Thursday, 60 - she reports that she was off schedule with her eating.   In the last 4 weeks her readings are between 97-100-127  post prandial  glucose:  Night varies usually <180, last night 157  -Reports hypoglycemic/hyperglycemic symptoms  -She woke up from her sleep and was shaking, she is certain that this was due to her not eating on her normal routine.  -Current meal patterns:  drinks:drinking plenty of water, at least three bottles per day  -Current exercise: she is exercising three days per week, Tuesday, Thursday and Friday for an hour. She reports that her insurance pays her back $10 dollars a month if she exercises.  -Educated on A1c and blood sugar goals; Prevention and management of hypoglycemic episodes; Benefits of routine self-monitoring of blood sugar; -Ms. Bonebrake is going to carry hard candy, nabs or a snack when you have a long day  -Counseled to check feet daily and get yearly eye exams -Recommended to continue current medication Collaborated with PCP team and patient to prescribe a GVOKE pen in the case of low BS.  Health Maintenance -Vaccine gaps: Shingrix Vaccine received first dose (06/04/2021) , COVID-19 Vaccine, Pneumonia Vaccine 62+ Years old ([place] 35)  -Collaborated with patient and she is going to complete the Shingrix shot in 2-6 months   Patient Goals/Self-Care Activities Patient will:  - take medications as prescribed as evidenced by patient report and record review  Follow Up Plan: The patient has been provided with contact information for the care management team and has been advised to call with any health related questions or concerns.       Medication Assistance: None required.  Patient affirms current coverage meets needs.  Compliance/Adherence/Medication fill history: Care Gaps: Shingrix Vaccine Covid-19 Vaccine  Pneumonia Vaccine   Star-Rating Drugs: Pravastatin 40 mg tablet  Valsartan 160 mg tablet   Patient's preferred pharmacy is:  [facility] ([place], [address] [place] [address] Phone:  [phone] Fax: [phone]  [id number] [place], [place] - [address] [place] [address] Phone: [phone] Fax: [phone]  Uses pill box? Yes Pt endorses 90% compliance  We discussed: Current pharmacy is preferred with insurance plan and patient is satisfied with pharmacy services Patient decided to: Continue current medication management strategy  Care Plan and Follow Up Patient Decision:  Patient agrees to Care Plan and Follow-up.  Plan: The patient has been provided with contact information for the care management team and has been advised to call with any health related questions or concerns.   [name], CPP, PharmD Clinical Pharmacist Practitioner [facility] [phone]

## 2021-06-12 NOTE — Patient Instructions (Signed)
Visit Information It was great speaking with you today!  Please let me know if you have any questions about our visit.   Goals Addressed             This Visit's Progress    Monitor and Manage My Blood Sugar-Diabetes Type 2       Timeframe:  Long-Range Goal Priority:  High Start Date:                             Expected End Date:                       Follow Up Date 10/04/2021  In Progress:   - check blood sugar at prescribed times - take the blood sugar log to all doctor visits - take the blood sugar meter to all doctor visits    Why is this important?   Checking your blood sugar at home helps to keep it from getting very high or very low.  Writing the results in a diary or log helps the doctor know how to care for you.  Your blood sugar log should have the time, date and the results.  Also, write down the amount of insulin or other medicine that you take.  Other information, like what you ate, exercise done and how you were feeling, will also be helpful.          Patient Care Plan: CCM Pharmacy Care Plan     Problem Identified: HTN, DM II   Priority: High     Long-Range Goal: Disease Management   Start Date: 07/12/2020  Recent Progress: On track  Priority: High  Note:   Current Barriers:  Unable to independently afford treatment regimen  Pharmacist Clinical Goal(s):  Patient will achieve control of Diabetes as evidenced by A1c<7 through collaboration with PharmD and provider.   Interventions: 1:1 collaboration with [name], MD regarding development and update of comprehensive plan of care as evidenced by provider attestation and co-signature Inter-disciplinary care team collaboration (see longitudinal plan of care) Comprehensive medication review performed; medication list updated in electronic medical record  Hypertension (BP goal <130/80) -Controlled -Current treatment: Valsartan-Hydrochlorothiazide 160-25 mg tablet once per day Appropriate,  Effective, Safe, Accessible Hydralazine 50 mg tablet taking it twice per day  Appropriate, Effective, Safe, Accessible Carvedilol 25 mg tablet - taking 1.5 tablet by mouth twice  Appropriate, Effective, Safe, Accessible -Current home readings: she is checking it three times per week 120/78,  -Current dietary habits: she is not using any salt at this time -Current exercise habits: please see diabetes -Denies hypotensive/hypertensive symptoms -Educated on Exercise goal of 150 minutes per week; Importance of home blood pressure monitoring; Proper BP monitoring technique; -Counseled to monitor BP at home monitoring three times per week, document, and provide log at future appointments -Recommended to continue current medication  Diabetes (A1c goal <7%) -Not ideally controlled -Current medications: Trulicity 1.5 BZ/1.6 ml - inject 1.5 mg once weekly   Appropriate, Effective, Safe, Accessible Humalog mix-inject 45 units into the skin at bedtime  Appropriate, Effective, Safe, Accessible -Current home glucose readings : she has had some low BS readings  fasting glucose: Last week she had one low BS reading last Thursday, 60 - she reports that she was off schedule with her eating.   In the last 4 weeks her readings are between 97-100-127  post prandial glucose:  Night varies usually <180, last night  157  -Reports hypoglycemic/hyperglycemic symptoms  -She woke up from her sleep and was shaking, she is certain that this was due to her not eating on her normal routine.  -Current meal patterns:  drinks:drinking plenty of water, at least three bottles per day  -Current exercise: she is exercising three days per week, Tuesday, Thursday and Friday for an hour. She reports that her insurance pays her back $10 dollars a month if she exercises.  -Educated on A1c and blood sugar goals; Prevention and management of hypoglycemic episodes; Benefits of routine self-monitoring of blood sugar; -Ms. Berryman is  going to carry hard candy, nabs or a snack when you have a long day  -Counseled to check feet daily and get yearly eye exams -Recommended to continue current medication Collaborated with PCP team and patient to prescribe a GVOKE pen in the case of low BS.  Health Maintenance -Vaccine gaps: Shingrix Vaccine received first dose (06/04/2021) , COVID-19 Vaccine, Pneumonia Vaccine 36+ Years old ([place] 68)  -Collaborated with patient and she is going to complete the Shingrix shot in 2-6 months   Patient Goals/Self-Care Activities Patient will:  - take medications as prescribed as evidenced by patient report and record review  Follow Up Plan: The patient has been provided with contact information for the care management team and has been advised to call with any health related questions or concerns.       Patient agreed to services and verbal consent obtained.   The patient verbalized understanding of instructions, educational materials, and care plan provided today and agreed to receive a mailed copy of patient instructions, educational materials, and care plan.   [name], PharmD Clinical Pharmacist [facility] [phone]

## 2021-06-18 ENCOUNTER — Other Ambulatory Visit: Payer: Self-pay

## 2021-06-18 ENCOUNTER — Ambulatory Visit (INDEPENDENT_AMBULATORY_CARE_PROVIDER_SITE_OTHER): Payer: Medicare Other | Admitting: Internal Medicine

## 2021-06-18 ENCOUNTER — Encounter: Payer: Self-pay | Admitting: Internal Medicine

## 2021-06-18 VITALS — BP 114/70 | HR 72 | Temp 98.2°F | Ht 62.8 in | Wt 265.4 lb

## 2021-06-18 DIAGNOSIS — I130 Hypertensive heart and chronic kidney disease with heart failure and stage 1 through stage 4 chronic kidney disease, or unspecified chronic kidney disease: Secondary | ICD-10-CM | POA: Diagnosis not present

## 2021-06-18 DIAGNOSIS — L918 Other hypertrophic disorders of the skin: Secondary | ICD-10-CM | POA: Diagnosis not present

## 2021-06-18 DIAGNOSIS — M25511 Pain in right shoulder: Secondary | ICD-10-CM | POA: Diagnosis not present

## 2021-06-18 DIAGNOSIS — R911 Solitary pulmonary nodule: Secondary | ICD-10-CM

## 2021-06-18 DIAGNOSIS — E1122 Type 2 diabetes mellitus with diabetic chronic kidney disease: Secondary | ICD-10-CM

## 2021-06-18 DIAGNOSIS — I5032 Chronic diastolic (congestive) heart failure: Secondary | ICD-10-CM

## 2021-06-18 DIAGNOSIS — E278 Other specified disorders of adrenal gland: Secondary | ICD-10-CM | POA: Diagnosis not present

## 2021-06-18 DIAGNOSIS — N1832 Chronic kidney disease, stage 3b: Secondary | ICD-10-CM | POA: Diagnosis not present

## 2021-06-18 DIAGNOSIS — Z6841 Body Mass Index (BMI) 40.0 and over, adult: Secondary | ICD-10-CM

## 2021-06-18 DIAGNOSIS — Z794 Long term (current) use of insulin: Secondary | ICD-10-CM

## 2021-06-18 DIAGNOSIS — N184 Chronic kidney disease, stage 4 (severe): Secondary | ICD-10-CM | POA: Insufficient documentation

## 2021-06-18 DIAGNOSIS — Z23 Encounter for immunization: Secondary | ICD-10-CM

## 2021-06-18 NOTE — Progress Notes (Signed)
I,Katawbba Wiggins,acting as a Education administrator for Maximino Greenland, MD.,have documented all relevant documentation on the behalf of Maximino Greenland, MD,as directed by  Maximino Greenland, MD while in the presence of Maximino Greenland, MD.  This visit occurred during the SARS-CoV-2 public health emergency.  Safety protocols were in place, including screening questions prior to the visit, additional usage of staff PPE, and extensive cleaning of exam room while observing appropriate contact time as indicated for disinfecting solutions.  Subjective:     Patient ID: Yvonne Miller , female    DOB: [birth year] , 71 y.o.   MRN: [id number]   Chief Complaint  Patient presents with   Diabetes   Hypertension    HPI  She is here today for a diabetes and HTN f/u. She reports compliance with meds. She denies having any headaches, chest pain, and palpitations.   Diabetes She presents for her follow-up diabetic visit. She has type 2 diabetes mellitus. Her disease course has been stable. There are no hypoglycemic associated symptoms. Pertinent negatives for diabetes include no blurred vision and no chest pain. There are no hypoglycemic complications. Diabetic complications include nephropathy. Risk factors for coronary artery disease include diabetes mellitus, dyslipidemia, hypertension, obesity and sedentary lifestyle. Current diabetic treatment includes insulin injections. She is compliant with treatment some of the time. She is following a generally healthy diet. She participates in exercise intermittently. Her breakfast blood glucose is taken between 8-9 am. Her breakfast blood glucose range is generally 110-130 mg/dl. An ACE inhibitor/angiotensin II receptor blocker is being taken.  Hypertension This is a chronic problem. The current episode started more than 1 year ago. The problem has been gradually improving since onset. The problem is controlled. Pertinent negatives include no blurred vision, chest pain, palpitations  or shortness of breath. The current treatment provides moderate improvement. Compliance problems include exercise.  Hypertensive end-organ damage includes kidney disease and heart failure.    Past Medical History:  Diagnosis Date   AICD (automatic cardioverter/defibrillator) present    Arthritis    Asthma    Carpal tunnel syndrome, bilateral    CKD (chronic kidney disease)    Diabetes mellitus    Encounter for assessment of implantable cardioverter-defibrillator (ICD)    GERD (gastroesophageal reflux disease)    Hyperlipemia    Hypertension    Obesity    morbid   Sleep apnea    wears CPAP set at 12   Wears glasses      Family History  Problem Relation Age of Onset   Hypertension Mother    Stroke Mother    Heart disease Father    Heart attack Sister    Heart attack Brother      Current Outpatient Medications:    ACCU-CHEK AVIVA PLUS test strip, USE TO CHECK BLOOD SUGAR 4 TIMES A DAY, Disp: 200 strip, Rfl: 11   albuterol (PROVENTIL HFA;VENTOLIN HFA) 108 (90 Base) MCG/ACT inhaler, Inhale 2 puffs into the lungs every 4 (four) hours as needed for wheezing or shortness of breath. , Disp: , Rfl:    amLODipine (NORVASC) 5 MG tablet, TAKE 1 TABLET BY MOUTH  DAILY, Disp: 90 tablet, Rfl: 3   aspirin EC 81 MG tablet, Take 81 mg by mouth at bedtime. , Disp: , Rfl:    B Complex Vitamins (B COMPLEX-B12) TABS, Take 1 tablet by mouth daily., Disp: , Rfl:    carvedilol (COREG) 25 MG tablet, Take 1.5 tablets (37.5 mg total) by mouth 2 (two) times daily.,  Disp: 270 tablet, Rfl: 3   cephALEXin (KEFLEX) 250 MG capsule, Take 250 mg by mouth daily., Disp: , Rfl:    Cholecalciferol (VITAMIN D) 50 MCG (2000 UT) tablet, Take 2,000 Units by mouth daily., Disp: , Rfl:    Chromium 1000 MCG TABS, Take 1,000 mcg by mouth daily. , Disp: , Rfl:    Cinnamon 500 MG capsule, Take 500 mg by mouth daily., Disp: , Rfl:    estradiol (ESTRACE) 0.1 MG/GM vaginal cream, Place vaginally., Disp: , Rfl:    furosemide  (LASIX) 40 MG tablet, Take 1 tablet (40 mg total) by mouth every morning., Disp: 90 tablet, Rfl: 0   hydrALAZINE (APRESOLINE) 50 MG tablet, TAKE 1 TABLET BY MOUTH  TWICE DAILY, Disp: 180 tablet, Rfl: 3   Insulin Lispro Prot & Lispro (HUMALOG MIX 75/25 KWIKPEN) (75-25) 100 UNIT/ML Kwikpen, Inject 45 Units into the skin at bedtime. (Patient taking differently: Inject 60 Units into the skin at bedtime.), Disp: 45 mL, Rfl: 3   nystatin (NYSTATIN) powder, Apply topically 3 (three) times daily as needed. As needed, Disp: 60 g, Rfl: 2   Omega-3 Fatty Acids (FISH OIL) 500 MG CAPS, Take 500 mg by mouth daily., Disp: , Rfl:    omeprazole (PRILOSEC) 40 MG capsule, Take 40 mg by mouth daily., Disp: , Rfl:    pravastatin (PRAVACHOL) 40 MG tablet, TAKE 1 TABLET BY MOUTH  DAILY, Disp: 90 tablet, Rfl: 3   Probiotic Product (PROBIOTIC PO), Take by mouth., Disp: , Rfl:    ROCKLATAN 0.02-0.005 % SOLN, SMARTSIG:1 Drop(s) In Eye(s) Every Evening, Disp: , Rfl:    spironolactone (ALDACTONE) 25 MG tablet, TAKE 1 TABLET BY MOUTH IN  THE MORNING, Disp: 90 tablet, Rfl: 3   TRULICITY 1.5 JK/9.3OI SOPN, INJECT 1.5 MG INTO THE SKIN ONCE A WEEK. (Patient taking differently: Inject 2 mg as directed every Wednesday.), Disp: 2 pen, Rfl: 2   valsartan-hydrochlorothiazide (DIOVAN-HCT) 160-25 MG tablet, TAKE 1 TABLET BY MOUTH  DAILY, Disp: 90 tablet, Rfl: 3   fluticasone (FLONASE) 50 MCG/ACT nasal spray, Place 1 spray into both nostrils daily. (Patient taking differently: Place 1 spray into both nostrils as needed for allergies.), Disp: 16 g, Rfl: 2   Allergies  Allergen Reactions   Other Other (See Comments)    NO BLOOD   . Jehovah witness     Review of Systems  Constitutional: Negative.   Eyes:  Negative for blurred vision.  Respiratory: Negative.  Negative for shortness of breath.   Cardiovascular: Negative.  Negative for chest pain and palpitations.  Gastrointestinal: Negative.   Musculoskeletal:  Positive for arthralgias.        Upon my leaving the room, she c/o r shoulder pain. Denies fall/trauma. Pain is worse at night, sx started over a month ago, but has worsened over the past month. Described as a dull, ache.   Skin:        She wants referral to Derm for removal of facial skin tags. States her glasses irritate these lesions.   Neurological: Negative.   Hematological:  Does not bruise/bleed easily.  Psychiatric/Behavioral:  Positive for self-injury.     Today's Vitals   06/18/21 0854  BP: 114/70  Pulse: 72  Temp: 98.2 F (36.8 C)  Weight: 265 lb 6.4 oz (120.4 kg)  Height: 5' 2.8" (1.595 m)  PainSc: 0-No pain   Body mass index is 47.31 kg/m.  Wt Readings from Last 3 Encounters:  06/18/21 265 lb 6.4 oz (120.4 kg)  02/12/21 271 lb 9.6 oz (123.2 kg)  12/28/20 273 lb (123.8 kg)    BP Readings from Last 3 Encounters:  06/18/21 114/70  02/12/21 134/72  01/16/21 140/71    Objective:  Physical Exam Vitals and nursing note reviewed.  Constitutional:      Appearance: Normal appearance.  HENT:     Head: Normocephalic and atraumatic.     Nose:     Comments: Masked     Mouth/Throat:     Comments: Masked  Eyes:     Extraocular Movements: Extraocular movements intact.  Cardiovascular:     Rate and Rhythm: Normal rate and regular rhythm.     Heart sounds: Normal heart sounds.  Pulmonary:     Effort: Pulmonary effort is normal.     Breath sounds: Normal breath sounds.  Skin:    General: Skin is warm.  Neurological:     General: No focal deficit present.     Mental Status: She is alert.  Psychiatric:        Mood and Affect: Mood normal.        Behavior: Behavior normal.        Assessment And Plan:     1. Type 2 diabetes mellitus with stage 3b chronic kidney disease, with long-term current use of insulin (HCC) Comments: Chronic, I will check labs as listed below. She is encouraged to keep BS/BP controlled, stay well hydrated and avoid NSAIDs  to prevent CKD progression.  - Hemoglobin  A1c - CMP14+EGFR - PTH, intact and calcium - Phosphorus - Protein electrophoresis, serum  2. Hypertensive heart and renal disease with renal failure, stage 1 through stage 4 or unspecified chronic kidney disease, with heart failure ([place]) Comments: Chronic, controlled. No med changes. Importance of dietary/medication compliance was d/w patient.   3. Chronic diastolic heart failure ([place]) Comments: Chronic, please see above #2.   4. Acute pain of right shoulder Comments: She agrees to Ortho referral for further evaluation and radiographic studies. Sx are suggestive of rotator cuff issues.  - Ambulatory referral to Orthopedic Surgery  5. Adrenal nodule (HCC) Comments: Chronic, CT abd/pelvis reviewed in full detail. No need for further evaluation per Radiology.   6. Lung nodule seen on imaging study Comments: Initially seen on CT adrenals June 2021. She is a non-smoker; therefore, not considered high risk. Further imaging is not recommended at this time.   7. Cutaneous skin tags Comments: I will refer her to Derm as requested. - Ambulatory referral to Dermatology  8. Class 3 severe obesity due to excess calories with serious comorbidity and body mass index (BMI) of 45.0 to 49.9 in adult [facility]) Comments: BMI 47. She was congratulated on her 6 lb weight loss since her last visit. She is encouraged to incorporate more exercise into her daily routine.  9. Need for vaccination Comments: She was given pneumovax-23 IM  x1.    Patient was given opportunity to ask questions. Patient verbalized understanding of the plan and was able to repeat key elements of the plan. All questions were answered to their satisfaction.   I, Maximino Greenland, MD, have reviewed all documentation for this visit. The documentation on 06/18/21 for the exam, diagnosis, procedures, and orders are all accurate and complete.   IF YOU HAVE BEEN REFERRED TO A SPECIALIST, IT MAY TAKE 1-2 WEEKS TO SCHEDULE/PROCESS THE REFERRAL.  IF YOU HAVE NOT HEARD FROM US/SPECIALIST IN TWO WEEKS, PLEASE GIVE [place] A CALL AT [phone].   THE PATIENT IS  ENCOURAGED TO PRACTICE SOCIAL DISTANCING DUE TO THE COVID-19 PANDEMIC.

## 2021-06-18 NOTE — Patient Instructions (Signed)

## 2021-06-20 LAB — PROTEIN ELECTROPHORESIS, SERUM
A/G Ratio: 0.9 (ref 0.7–1.7)
Albumin ELP: 3.5 g/dL (ref 2.9–4.4)
Alpha 1: 0.3 g/dL (ref 0.0–0.4)
Alpha 2: 0.8 g/dL (ref 0.4–1.0)
Beta: 1.2 g/dL (ref 0.7–1.3)
Gamma Globulin: 1.5 g/dL (ref 0.4–1.8)
Globulin, Total: 3.7 g/dL (ref 2.2–3.9)

## 2021-06-20 LAB — CMP14+EGFR
ALT: 12 IU/L (ref 0–32)
AST: 13 IU/L (ref 0–40)
Albumin/Globulin Ratio: 1.4 (ref 1.2–2.2)
Albumin: 4.2 g/dL (ref 3.7–4.7)
Alkaline Phosphatase: 103 IU/L (ref 44–121)
BUN/Creatinine Ratio: 16 (ref 12–28)
BUN: 22 mg/dL (ref 8–27)
Bilirubin Total: 0.2 mg/dL (ref 0.0–1.2)
CO2: 27 mmol/L (ref 20–29)
Calcium: 9.2 mg/dL (ref 8.7–10.3)
Chloride: 100 mmol/L (ref 96–106)
Creatinine, Ser: 1.34 mg/dL — ABNORMAL HIGH (ref 0.57–1.00)
Globulin, Total: 3 g/dL (ref 1.5–4.5)
Glucose: 80 mg/dL (ref 70–99)
Potassium: 4.4 mmol/L (ref 3.5–5.2)
Sodium: 139 mmol/L (ref 134–144)
Total Protein: 7.2 g/dL (ref 6.0–8.5)
eGFR: 42 mL/min/{1.73_m2} — ABNORMAL LOW (ref >59)

## 2021-06-20 LAB — PHOSPHORUS: Phosphorus: 3.6 mg/dL (ref 3.0–4.3)

## 2021-06-20 LAB — PTH, INTACT AND CALCIUM: PTH: 37 pg/mL (ref 15–65)

## 2021-06-20 LAB — HEMOGLOBIN A1C
Est. average glucose Bld gHb Est-mCnc: 186 mg/dL
Hgb A1c MFr Bld: 8.1 % — ABNORMAL HIGH (ref 4.8–5.6)

## 2021-06-21 ENCOUNTER — Ambulatory Visit: Payer: Medicare Other | Admitting: Cardiology

## 2021-06-21 ENCOUNTER — Encounter: Payer: Self-pay | Admitting: Cardiology

## 2021-06-21 ENCOUNTER — Other Ambulatory Visit: Payer: Self-pay

## 2021-06-21 VITALS — BP 123/62 | HR 69 | Temp 98.2°F | Resp 16 | Ht 62.0 in | Wt 268.2 lb

## 2021-06-21 DIAGNOSIS — I428 Other cardiomyopathies: Secondary | ICD-10-CM | POA: Diagnosis not present

## 2021-06-21 DIAGNOSIS — I10 Essential (primary) hypertension: Secondary | ICD-10-CM

## 2021-06-21 DIAGNOSIS — I5042 Chronic combined systolic (congestive) and diastolic (congestive) heart failure: Secondary | ICD-10-CM | POA: Diagnosis not present

## 2021-06-21 NOTE — Progress Notes (Signed)
Primary Physician/Referring:  [name], MD  Patient ID: Yvonne Miller, female    DOB: [birth year], 71 y.o.   MRN: [id number]  Chief Complaint  Patient presents with   Non-ischemic cardiomyopathy    Congestive Heart Failure   Follow-up    6 months    HPI:    Yvonne Miller  is a 71 y.o. 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  She also has morbid obesity, OSA not on CPAP recently, hypertension, DM with stage 3b CKD, hyperlipidemia, degenerative joint disease and recent diagnosis of diabetic gastroparesis.  She is here today for 72-month follow-up, states that she is doing well, she has not had any shortness of breath, PND or orthopnea or leg edema.  Past Medical History:  Diagnosis Date   AICD (automatic cardioverter/defibrillator) present    Arthritis    Asthma    Carpal tunnel syndrome, bilateral    CKD (chronic kidney disease)    Diabetes mellitus    Encounter for assessment of implantable cardioverter-defibrillator (ICD)    GERD (gastroesophageal reflux disease)    Hyperlipemia    Hypertension    Obesity    morbid   Sleep apnea    wears CPAP set at 12   Wears glasses    Past Surgical History:  Procedure Laterality Date   ABDOMINAL HYSTERECTOMY     BIOPSY  03/01/2019   Procedure: BIOPSY;  Surgeon: Juanita Craver, MD;  Location: [facility];  Service: Endoscopy;;   BREAST SURGERY  1968   breast mass excision   CARPAL TUNNEL RELEASE Left 07/10/2017   Procedure: CARPAL TUNNEL RELEASE;  Surgeon: Ashok Pall, MD;  Location: [facility];  Service: Neurosurgery;  Laterality: Left;  left   CARPAL TUNNEL RELEASE Right 03/19/2018   Procedure: RIGHT CARPAL TUNNEL RELEASE;  Surgeon: Ashok Pall, MD;  Location: [place];  Service: Neurosurgery;  Laterality: Right;  right   CERVICAL SPINE SURGERY  2006   CHOLECYSTECTOMY      COLONOSCOPY WITH PROPOFOL N/A 03/01/2019   Procedure: COLONOSCOPY WITH PROPOFOL;  Surgeon: Juanita Craver, MD;  Location: [facility];  Service: Endoscopy;  Laterality: N/A;   DILATION AND CURETTAGE OF UTERUS     ICD IMPLANT     JOINT REPLACEMENT     TONSILLECTOMY  1961   TOTAL KNEE ARTHROPLASTY Left 01/26/2018   Procedure: LEFT TOTAL KNEE ARTHROPLASTY;  Surgeon: Mcarthur Rossetti, MD;  Location: [place];  Service: Orthopedics;  Laterality: Left;   Family History  Problem Relation Age of Onset   Hypertension Mother    Stroke Mother    Heart disease Father    Heart attack Sister    Heart attack Brother     Social History   Tobacco Use   Smoking status: Never   Smokeless tobacco: Never  Substance Use Topics   Alcohol use: No   Marital Status: Married  ROS  Review of Systems  Cardiovascular:  Negative for chest pain, dyspnea on exertion, leg swelling and syncope.  Respiratory:  Positive for snoring (sleep apnea not using CPAP).   Gastrointestinal:  Positive for change in bowel habit. Negative for melena.  Genitourinary:  Negative for dysuria.  Neurological:  Negative for headaches and light-headedness.  Objective  Blood pressure 123/62, pulse 69, temperature 98.2 F (36.8 C), temperature source Temporal, resp. rate 16, height 5\' 2"  (1.575 m), weight 268 lb 3.2 oz (121.7  kg), SpO2 96 %.  Vitals with BMI 06/21/2021 06/18/2021 02/12/2021  Height 5\' 2"  5' 2.8" 5' 2.8"  Weight 268 lbs 3 oz 265 lbs 6 oz 271 lbs 10 oz  BMI 49.04 32.67 12.45  Systolic 809 983 382  Diastolic 62 70 72  Pulse 69 72 80     Physical Exam Constitutional:      Appearance: She is well-developed.     Comments: Morbidly Obese   Neck:     Thyroid: No thyromegaly.     Vascular: No JVD.     Comments: Short neck and difficult to evaluate JVP  Cardiovascular:     Rate and Rhythm: Normal rate and regular rhythm.     Pulses: Normal pulses and intact distal pulses.          Carotid pulses are 2+ on the  right side and 2+ on the left side.    Heart sounds: No murmur heard.   No gallop.     Comments: Femoral and popliteal pulse difficult to feel due to patient's body habitus.  Pulmonary:     Effort: Pulmonary effort is normal. No accessory muscle usage or respiratory distress.     Breath sounds: Normal breath sounds.  Abdominal:     General: Bowel sounds are normal.     Palpations: Abdomen is soft.     Comments: Obese. Pannus present   Musculoskeletal:     Right lower leg: No edema.     Left lower leg: No edema.  Skin:    Capillary Refill: Capillary refill takes less than 2 seconds.   Laboratory examination:   Recent Labs    06/21/20 1137 08/31/20 1036 11/14/20 1004 02/12/21 0910 06/18/21 0936  NA 138   < > 135 138 139  K 4.7   < > 4.1 4.9 4.4  CL 103   < > 100 97 100  CO2 19*   < > 27 28 27   GLUCOSE 129*   < > 116* 161* 80  BUN 22   < > 25* 27 22  CREATININE 1.37*   < > 1.60* 1.49* 1.34*  CALCIUM 9.3   < > 8.9 9.5 9.2  GFRNONAA 39*  --  34*  --   --   GFRAA 45*  --   --   --   --    < > = values in this interval not displayed.   estimated creatinine clearance is 47.8 mL/min (A) (by C-G formula based on SCr of 1.34 mg/dL (H)).  CMP Latest Ref Rng & Units 06/18/2021 02/12/2021 11/14/2020  Glucose 70 - 99 mg/dL 80 161(H) 116(H)  BUN 8 - 27 mg/dL 22 27 25(H)  Creatinine 0.57 - 1.00 mg/dL 1.34(H) 1.49(H) 1.60(H)  Sodium 134 - 144 mmol/L 139 138 135  Potassium 3.5 - 5.2 mmol/L 4.4 4.9 4.1  Chloride 96 - 106 mmol/L 100 97 100  CO2 20 - 29 mmol/L 27 28 27   Calcium 8.7 - 10.3 mg/dL 9.2 9.5 8.9  Total Protein 6.0 - 8.5 g/dL 7.2 7.0 -  Total Bilirubin 0.0 - 1.2 mg/dL 0.2 0.2 -  Alkaline Phos 44 - 121 IU/L 103 109 -  AST 0 - 40 IU/L 13 13 -  ALT 0 - 32 IU/L 12 10 -   CBC Latest Ref Rng & Units 11/14/2020 06/21/2020 10/05/2019  WBC 4.0 - 10.5 K/uL 10.1 10.5 10.6  Hemoglobin 12.0 - 15.0 g/dL 12.0 11.6 11.5  Hematocrit 36.0 - 46.0 % 37.7 36.8 36.1  Platelets 150 - 400 K/uL 300  333 328   Lipid Panel     Component Value Date/Time   CHOL 139 02/12/2021 0910   TRIG 194 (H) 02/12/2021 0910   HDL 40 02/12/2021 0910   CHOLHDL 3.5 02/12/2021 0910   CHOLHDL 3.8 04/22/2009 0603   VLDL 45 (H) 04/22/2009 0603   LDLCALC 67 02/12/2021 0910   HEMOGLOBIN A1C Lab Results  Component Value Date   HGBA1C 8.1 (H) 06/18/2021   MPG 165.68 01/15/2018   TSH Recent Labs    06/21/20 1137  TSH 1.110   BNP (last 3 results) Recent Labs    08/31/20 1037 09/14/20 1331  BNP 44.3 28.6   Medications and allergies   Allergies  Allergen Reactions   Other Other (See Comments)    NO BLOOD   . Jehovah witness      Current Outpatient Medications:    ACCU-CHEK AVIVA PLUS test strip, USE TO CHECK BLOOD SUGAR 4 TIMES A DAY, Disp: 200 strip, Rfl: 11   albuterol (PROVENTIL HFA;VENTOLIN HFA) 108 (90 Base) MCG/ACT inhaler, Inhale 2 puffs into the lungs every 4 (four) hours as needed for wheezing or shortness of breath. , Disp: , Rfl:    amLODipine (NORVASC) 5 MG tablet, TAKE 1 TABLET BY MOUTH  DAILY, Disp: 90 tablet, Rfl: 3   aspirin EC 81 MG tablet, Take 81 mg by mouth at bedtime. , Disp: , Rfl:    B Complex Vitamins (B COMPLEX-B12) TABS, Take 1 tablet by mouth daily., Disp: , Rfl:    carvedilol (COREG) 25 MG tablet, Take 1.5 tablets (37.5 mg total) by mouth 2 (two) times daily., Disp: 270 tablet, Rfl: 3   cephALEXin (KEFLEX) 250 MG capsule, Take 250 mg by mouth daily., Disp: , Rfl:    Cholecalciferol (VITAMIN D) 50 MCG (2000 UT) tablet, Take 2,000 Units by mouth daily., Disp: , Rfl:    Chromium 1000 MCG TABS, Take 1,000 mcg by mouth daily. , Disp: , Rfl:    Cinnamon 500 MG capsule, Take 500 mg by mouth daily., Disp: , Rfl:    estradiol (ESTRACE) 0.1 MG/GM vaginal cream, Place vaginally., Disp: , Rfl:    fluticasone (FLONASE) 50 MCG/ACT nasal spray, Place 1 spray into both nostrils daily. (Patient taking differently: Place 1 spray into both nostrils as needed for allergies.), Disp:  16 g, Rfl: 2   furosemide (LASIX) 40 MG tablet, Take 1 tablet (40 mg total) by mouth every morning., Disp: 90 tablet, Rfl: 0   hydrALAZINE (APRESOLINE) 50 MG tablet, TAKE 1 TABLET BY MOUTH  TWICE DAILY, Disp: 180 tablet, Rfl: 3   Insulin Lispro Prot & Lispro (HUMALOG MIX 75/25 KWIKPEN) (75-25) 100 UNIT/ML Kwikpen, Inject 45 Units into the skin at bedtime. (Patient taking differently: Inject 60 Units into the skin at bedtime.), Disp: 45 mL, Rfl: 3   nystatin (NYSTATIN) powder, Apply topically 3 (three) times daily as needed. As needed, Disp: 60 g, Rfl: 2   Omega-3 Fatty Acids (FISH OIL) 500 MG CAPS, Take 500 mg by mouth daily., Disp: , Rfl:    omeprazole (PRILOSEC) 40 MG capsule, Take 40 mg by mouth daily., Disp: , Rfl:    pravastatin (PRAVACHOL) 40 MG tablet, TAKE 1 TABLET BY MOUTH  DAILY, Disp: 90 tablet, Rfl: 3   Probiotic Product (PROBIOTIC PO), Take by mouth., Disp: , Rfl:    ROCKLATAN 0.02-0.005 % SOLN, SMARTSIG:1 Drop(s) In Eye(s) Every Evening, Disp: , Rfl:    TRULICITY 1.5 YK/9.9IP SOPN, INJECT 1.5  MG INTO THE SKIN ONCE A WEEK. (Patient taking differently: Inject 2 mg as directed every Wednesday.), Disp: 2 pen, Rfl: 2   valsartan-hydrochlorothiazide (DIOVAN-HCT) 160-25 MG tablet, TAKE 1 TABLET BY MOUTH  DAILY, Disp: 90 tablet, Rfl: 3   Radiology:   No results found.  Cardiac Studies:   Sleep study 2010 (sleep apnea-has a CPAP but doesn't use it every night).  Coronary Angiography  R+ L 03/04/11: and in 2010 Normal coronary arteries. Moderate pulmonary hypertension.  PCV ECHOCARDIOGRAM COMPLETE 10/30/2020 Left ventricle cavity is normal in size and wall thickness. Normal global wall motion. Normal LV systolic function with visual EF 50-55%. Doppler evidence of grade I (impaired) diastolic dysfunction, normal LAP. Left atrial cavity is mildly dilated. Trileaflet aortic valve.  Trace aortic stenosis. Trace aortic regurgitation. Mild (Grade I) mitral regurgitation. Mild to moderate  tricuspid regurgitation. Estimated pulmonary artery systolic pressure 31 mmHg. No significant change compared to previous study in 2016.   EKG EKG 06/21/2021: Atrial sensed and biventricular paced rhythm.  No further analysis.  No significant change from 08/31/2020.   Assessment     ICD-10-CM   1. Non-ischemic cardiomyopathy (HCC)  I42.8 EKG 12-Lead    2. Chronic combined systolic and diastolic congestive heart failure (HCC)  I50.42     3. Essential hypertension  I10       Orders Placed This Encounter  Procedures   EKG 12-Lead     No orders of the defined types were placed in this encounter.    Medications Discontinued During This Encounter  Medication Reason   spironolactone (ALDACTONE) 25 MG tablet Discontinued by provider    Recommendations:   Yvonne Miller  is a 71 y.o.  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  She also has morbid obesity, OSA not on CPAP recently, hypertension, DM with stage 3a-b CKD, hyperlipidemia, degenerative joint disease and recent diagnosis of diabetic gastroparesis.  Patient had an episode of acute decompensated heart failure and frequent UTI 6 months ago, all of this has resolved.  She is currently doing well.  External labs reviewed, renal function has remained stable, elevated triglycerides related to uncontrolled diabetes mellitus over the holidays during Christmas.  No clinical evidence of heart failure.  She has not had ICD transmission recently and I advised her that she should keep up with ICD transmission as she is 100% BiV paced.  She still has about 2.5 to 3 years of battery life.  Blood pressure is well controlled, no leg edema, otherwise stable from cardiac standpoint and she is on appropriate medical therapy.  I will see her back in 6 months.      Adrian Prows, MD, [facility] 06/21/2021, 9:33  AM Office: [phone] Pager: [phone]

## 2021-06-24 ENCOUNTER — Telehealth: Payer: Medicare Other

## 2021-06-24 ENCOUNTER — Ambulatory Visit: Payer: Self-pay

## 2021-06-24 DIAGNOSIS — I5032 Chronic diastolic (congestive) heart failure: Secondary | ICD-10-CM

## 2021-06-24 DIAGNOSIS — E1122 Type 2 diabetes mellitus with diabetic chronic kidney disease: Secondary | ICD-10-CM

## 2021-06-24 DIAGNOSIS — Z794 Long term (current) use of insulin: Secondary | ICD-10-CM

## 2021-06-24 DIAGNOSIS — E559 Vitamin D deficiency, unspecified: Secondary | ICD-10-CM

## 2021-06-24 DIAGNOSIS — I130 Hypertensive heart and chronic kidney disease with heart failure and stage 1 through stage 4 chronic kidney disease, or unspecified chronic kidney disease: Secondary | ICD-10-CM

## 2021-06-24 DIAGNOSIS — E785 Hyperlipidemia, unspecified: Secondary | ICD-10-CM

## 2021-06-24 DIAGNOSIS — N1832 Chronic kidney disease, stage 3b: Secondary | ICD-10-CM

## 2021-06-25 NOTE — Chronic Care Management (AMB) (Signed)
Chronic Care Management   CCM RN Visit Note  06/24/2021 Name: Yvonne Miller MRN: [id number] DOB: 25-Aug-1950  Subjective: Yvonne Miller is a 71 y.o. year old female who is a primary care patient of Yvonne Peng, MD. The care management team was consulted for assistance with disease management and care coordination needs.    Engaged with patient by telephone for follow up visit in response to provider referral for case management and/or care coordination services.   Consent to Services:  The patient was given information about Chronic Care Management services, agreed to services, and gave verbal consent prior to initiation of services.  Please see initial visit note for detailed documentation.   Patient agreed to services and verbal consent obtained.   Assessment: Review of patient past medical history, allergies, medications, health status, including review of consultants reports, laboratory and other test data, was performed as part of comprehensive evaluation and provision of chronic care management services.   SDOH (Social Determinants of Health) assessments and interventions performed:  Yes, no acute changes   CCM Care Plan  Allergies  Allergen Reactions   Other Other (See Comments)    NO BLOOD   . Jehovah witness    Outpatient Encounter Medications as of 06/24/2021  Medication Sig   acetaminophen (TYLENOL) 500 MG tablet Take 500 mg by mouth every 6 (six) hours as needed for moderate pain.   Insulin Lispro Prot & Lispro (HUMALOG MIX 75/25 KWIKPEN) (75-25) 100 UNIT/ML Kwikpen Inject 45 Units into the skin at bedtime. (Patient taking differently: Inject 60 Units into the skin at bedtime.)   TRULICITY 1.5 MG/0.5ML SOPN INJECT 1.5 MG INTO THE SKIN ONCE A WEEK. (Patient taking differently: Inject 2 mg as directed every Wednesday.)   ACCU-CHEK AVIVA PLUS test strip USE TO CHECK BLOOD SUGAR 4 TIMES A DAY   albuterol (PROVENTIL HFA;VENTOLIN HFA) 108 (90 Base) MCG/ACT inhaler Inhale 2  puffs into the lungs every 4 (four) hours as needed for wheezing or shortness of breath.    amLODipine (NORVASC) 5 MG tablet TAKE 1 TABLET BY MOUTH  DAILY   aspirin EC 81 MG tablet Take 81 mg by mouth at bedtime.    B Complex Vitamins (B COMPLEX-B12) TABS Take 1 tablet by mouth daily.   carvedilol (COREG) 25 MG tablet Take 1.5 tablets (37.5 mg total) by mouth 2 (two) times daily.   cephALEXin (KEFLEX) 250 MG capsule Take 250 mg by mouth daily.   Cholecalciferol (VITAMIN D) 50 MCG (2000 UT) tablet Take 2,000 Units by mouth daily.   Chromium 1000 MCG TABS Take 1,000 mcg by mouth daily.    Cinnamon 500 MG capsule Take 500 mg by mouth daily.   estradiol (ESTRACE) 0.1 MG/GM vaginal cream Place vaginally.   fluticasone (FLONASE) 50 MCG/ACT nasal spray Place 1 spray into both nostrils daily. (Patient taking differently: Place 1 spray into both nostrils as needed for allergies.)   furosemide (LASIX) 40 MG tablet Take 1 tablet (40 mg total) by mouth every morning.   hydrALAZINE (APRESOLINE) 50 MG tablet TAKE 1 TABLET BY MOUTH  TWICE DAILY   nystatin (NYSTATIN) powder Apply topically 3 (three) times daily as needed. As needed   Omega-3 Fatty Acids (FISH OIL) 500 MG CAPS Take 500 mg by mouth daily.   omeprazole (PRILOSEC) 40 MG capsule Take 40 mg by mouth daily.   pravastatin (PRAVACHOL) 40 MG tablet TAKE 1 TABLET BY MOUTH  DAILY   Probiotic Product (PROBIOTIC PO) Take by mouth.  ROCKLATAN 0.02-0.005 % SOLN SMARTSIG:1 Drop(s) In Eye(s) Every Evening   valsartan-hydrochlorothiazide (DIOVAN-HCT) 160-25 MG tablet TAKE 1 TABLET BY MOUTH  DAILY   No facility-administered encounter medications on file as of 06/24/2021.    Patient Active Problem List   Diagnosis Date Noted   CKD (chronic kidney disease) stage 4, GFR 15-29 ml/min (HCC) 06/18/2021   Adrenal nodule ([place]) 02/12/2021   Recurrent UTI 10/10/2020   Class 3 severe obesity due to excess calories with serious comorbidity and body mass index (BMI) of  45.0 to 49.9 in adult [facility]) 10/10/2020   Non-ischemic cardiomyopathy ([place]) 02/18/2019   Encounter for assessment of implantable cardioverter-defibrillator (ICD)    Type 2 diabetes mellitus with stage 3 chronic kidney disease, with long-term current use of insulin ([place]) 03/04/2018   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   Chronic combined systolic and diastolic congestive heart failure ([place]) 03/04/2018   Chronic renal disease, stage II 03/04/2018   Status post revision of total replacement of left knee 01/26/2018   Unilateral primary osteoarthritis, left knee 08/18/2017   Biventricular automatic implantable cardioverter defibrillator in situ 12/13/2013   ICD -  Biventricular  [employer] Dynagen X4 CRT-D Model G158 ICD in place 10/28/13 10/28/2013   HTN (hypertension) 10/03/2013   Hyperlipemia 10/03/2013   LBBB (left bundle branch block) 10/03/2013   Cardiomyopathy ([place]) 10/03/2013   DM (diabetes mellitus) ([place]) 10/03/2013    Conditions to be addressed/monitored: DM type 2, HTN, HLD, CKD, CHF, Vitamin D deficiency  Care Plan : [place] of Care  Updates made by Lynne Logan, RN since 06/24/2021 12:00 AM     Problem: No plan established for management of chronic disease states (DM type 2, HTN, HLD, CKD, CHF, Vitamin D deficiency)   Priority: High     Long-Range Goal: Establishment of plan of care for management of chronic disease conditions DM type 2, HTN, HLD, CKD, CHF, Vitamin D deficiency   Start Date: 05/01/2021  Expected End Date: 05/01/2022  Recent Progress: On track  Priority: High  Note:   Current Barriers:  Knowledge Deficits related to plan of care for management of DM type 2, HTN, HLD, CKD, CHF, Vitamin D deficiency  Chronic Disease Management support and education needs related to DM type 2, HTN, HLD, CKD, CHF, Vitamin D deficiency   RNCM Clinical Goal(s):  Patient will verbalize basic understanding of  DM type 2, HTN, HLD, CKD, CHF,  Vitamin D deficiency disease process and self health management plan as evidenced by patient will report having no disease exacerbations related to her chronic disease states as listed above take all medications exactly as prescribed and will call provider for medication related questions as evidenced by patient will report having no missed doses of her prescribed medications  demonstrate Improved health management independence as evidenced by patient will report 100% adherence to her prescribed treatment plan  continue to work with RN Care Manager to address care management and care coordination needs related to  DM type 2, HTN, HLD, CKD, CHF, Vitamin D deficiency as evidenced by adherence to CM Team Scheduled appointments demonstrate ongoing self health care management ability   as evidenced by    through collaboration with RN Care manager, provider, and care team.   Interventions: 1:1 collaboration with primary care provider regarding development and update of comprehensive plan of care as evidenced by provider attestation and co-signature Inter-disciplinary care team collaboration (see longitudinal plan of care) Evaluation of current treatment plan related to  self management and patient's adherence to plan as established by provider  Heart Failure Interventions:  (Status:  Goal on track:  Yes.) Long Term Goal Reviewed Cardiology f/u completed with Dr. Rockwell [place] on 06/21/21 with the following Assessment/Plan noted: Assessment    1. Non-ischemic cardiomyopathy (HCC)  I42.8 EKG 12-Lead  2. Chronic combined systolic and diastolic congestive heart failure (HCC)  I50.42    3. Essential hypertension  I10     Medications Discontinued During This Encounter  Medication Reason   spironolactone (ALDACTONE) 25 MG tablet Discontinued by provider    Recommendations:   Yvonne Miller  is a 71 y.o.  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  She also has morbid obesity, OSA not on CPAP recently, hypertension, DM with stage 3a-b CKD, hyperlipidemia, degenerative joint disease and recent diagnosis of diabetic gastroparesis.  Patient had an episode of acute decompensated heart failure and frequent UTI 6 months ago, all of this has resolved.  She is currently doing well.  External labs reviewed, renal function has remained stable, elevated triglycerides related to uncontrolled diabetes mellitus over the holidays during Christmas.  No clinical evidence of heart failure.  She has not had ICD transmission recently and I advised her that she should keep up with ICD transmission as she is 100% BiV paced.  She still has about 2.5 to 3 years of battery life.  Blood pressure is well controlled, no leg edema, otherwise stable from cardiac standpoint and she is on appropriate medical therapy.  I will see her back in 6 months.       Instructions Return in about 6 months (around 12/19/2021) for CHF, Hypertension  Reinforced importance to adhere to dietary and exercise recommendations  Reinforced importance of daily weight Mailed printed CHF Home Action Plan  Discussed plans with patient for ongoing care management follow up and provided patient with direct contact information for care management team  Diabetes Interventions:  (Status:  Goal on track:  Yes.) Long Term Goal Assessed patient's understanding of A1c goal: <7% Provided education to patient about basic DM disease process Reviewed medications with patient and discussed importance of medication adherence Provided patient with written educational materials related to hypo and hyperglycemia and importance of correct treatment Advised patient, providing education and rationale, to check cbg daily before meals and at bedtime and record, calling PCP for findings outside established parameters Review of patient  status, including review of consultants reports, relevant laboratory and other test results, and medications completed Mailed printed educational materials related to Diabetes Management  Discussed plans with patient for ongoing care management follow up and provided patient with direct contact information for care management team Lab Results  Component Value Date   HGBA1C 8.1 (H) 06/18/2021   Impaired Urinary Elimination:  (Status:  Goal Met.)  Long Term Goal Evaluation of current treatment plan related to  Impaired Urinary Elimination , self-management and patient's adherence to plan as established by provider Review of patient status, including review of consultant's reports, relevant laboratory and other test results, and medications completed Reviewed medications with patient and discussed importance of medication adherence Reviewed and discussed signs/symptoms of UTI and when to seek medical attention if symptoms reoccur Reinforced the importance of increasing daily water intake to 64 oz daily unless otherwise directed Discussed plans with patient for ongoing care management follow up and provided patient with direct contact information  for care management team  Hypertension Interventions:  (Status:  Goal on track:  Yes.) Long Term Goal Last practice recorded BP readings:  BP Readings from Last 3 Encounters:  06/21/21 123/62  06/18/21 114/70  02/12/21 134/72  Most recent eGFR/CrCl:  Lab Results  Component Value Date   EGFR 42 (L) 06/18/2021    No components found for: CRCL Evaluation of current treatment plan related to hypertension self management and patient's adherence to plan as established by provider Reviewed medications with patient and discussed importance of compliance Counseled on the importance of exercise goals with target of 150 minutes per week Advised patient, providing education and rationale, to monitor blood pressure daily and record, calling PCP for findings  outside established parameters Discussed complications of poorly controlled blood pressure such as heart disease, stroke, circulatory complications, vision complications, kidney impairment, sexual dysfunction Mailed printed educational materials related to Chair Exercises  Discussed plans with patient for ongoing care management follow up and provided patient with direct contact information for care management team   Chronic Kidney Disease Interventions:  (Status:  Goal on track:  Yes.) Long Term Goal Assessed the Patient understanding of chronic kidney disease    Evaluation of current treatment plan related to chronic kidney disease self management and patient's adherence to plan as established by provider      Reviewed prescribed diet increase water to 48-64 oz daily unless otherwise directed  Provided education on kidney disease progression    Discussed plans with patient for ongoing care management follow up and provided patient with direct contact information for care management team Last practice recorded BP readings:  BP Readings from Last 3 Encounters:  06/21/21 123/62  06/18/21 114/70  02/12/21 134/72  Most recent eGFR/CrCl:  Lab Results  Component Value Date   EGFR 42 (L) 06/18/2021    No components found for: CRCL   Patient Goals/Self-Care Activities: Take all medications as prescribed Attend all scheduled provider appointments Call pharmacy for medication refills 3-7 days in advance of running out of medications Perform all self care activities independently  Perform IADL's (shopping, preparing meals, housekeeping, managing finances) independently Call provider office for new concerns or questions  drink 6 to 8 glasses of water each day manage portion size call doctor for signs and symptoms of high blood pressure take medications for blood pressure exactly as prescribed report new symptoms to your doctor adhere to prescribed diet: low fat  Follow Up Plan:  Telephone  follow up appointment with care management team member scheduled for:  10/23/21    Barb Merino, RN, BSN, CCM Care Management Coordinator [place] [facility]  Direct Phone: [phone]

## 2021-06-25 NOTE — Patient Instructions (Signed)
Visit Information  Thank you for taking time to visit with me today. Please don't hesitate to contact me if I can be of assistance to you before our next scheduled telephone appointment.  Following are the goals we discussed today:  (Copy and paste patient goals from clinical care plan here)  Our next appointment is by telephone on 10/23/21 at 10:30 AM  Please call the care guide team at [phone] if you need to cancel or reschedule your appointment.   If you are experiencing a Mental Health or [place] or need someone to talk to, please call 1-800-273-TALK (toll free, 24 hour hotline)   The patient verbalized understanding of instructions, educational materials, and care plan provided today and agreed to receive a mailed copy of patient instructions, educational materials, and care plan.   Barb Merino, RN, BSN, CCM Care Management Coordinator [place] [facility]  Direct Phone: [phone]

## 2021-06-26 ENCOUNTER — Ambulatory Visit: Payer: Self-pay

## 2021-06-26 ENCOUNTER — Ambulatory Visit: Payer: Medicare Other | Admitting: Orthopaedic Surgery

## 2021-06-26 ENCOUNTER — Encounter: Payer: Self-pay | Admitting: Orthopaedic Surgery

## 2021-06-26 ENCOUNTER — Other Ambulatory Visit: Payer: Self-pay

## 2021-06-26 ENCOUNTER — Ambulatory Visit: Payer: Medicare Other | Admitting: Cardiology

## 2021-06-26 DIAGNOSIS — M542 Cervicalgia: Secondary | ICD-10-CM

## 2021-06-26 DIAGNOSIS — M25511 Pain in right shoulder: Secondary | ICD-10-CM | POA: Diagnosis not present

## 2021-06-26 MED ORDER — METHYLPREDNISOLONE ACETATE 40 MG/ML IJ SUSP
40.0000 mg | INTRAMUSCULAR | Status: AC | PRN
  Administered 2021-06-26: 40 mg via INTRA_ARTICULAR

## 2021-06-26 MED ORDER — ACETAMINOPHEN-CODEINE #3 300-30 MG PO TABS: 1.0000 | ORAL_TABLET | Freq: Three times a day (TID) | ORAL | 0 refills | Status: DC | PRN

## 2021-06-26 MED ORDER — LIDOCAINE HCL 1 % IJ SOLN
3.0000 mL | INTRAMUSCULAR | Status: AC | PRN
  Administered 2021-06-26: 3 mL

## 2021-06-26 NOTE — Progress Notes (Signed)
Office Visit Note   Patient: Yvonne Miller           Date of Birth: 1950-07-08           MRN: [id number] Visit Date: 06/26/2021              Requested by: [name], [address] [place],  [place] [address] PCP: [name], MD   Assessment & Plan: Visit Diagnoses:  1. Acute pain of right shoulder   2. Neck pain     Plan: I did recommend a steroid injection in her right shoulder subacromial outlet and explained the risk and benefits of steroid injections.  She will watch her blood glucose closely.  She did tolerate the injection well.  We will try some Tylenol 3 that she can try at night for pain.  Follow-up can be as needed.  I would not repeat an injection for at least 3 months.  We could always send her to physical therapy if this is not getting better and she will call us to let us know.  She can see Dr. Christella Noa if she has worsening neck issues.  Follow-Up Instructions: Return if symptoms worsen or fail to improve.   Orders:  Orders Placed This Encounter  Procedures   Large Joint Inj   XR Cervical Spine 2 or 3 views   XR Shoulder Right   Meds ordered this encounter  Medications   acetaminophen-codeine (TYLENOL #3) 300-30 MG tablet    Sig: Take 1-2 tablets by mouth every 8 (eight) hours as needed.    Dispense:  30 tablet    Refill:  0      Procedures: Large Joint Inj: R subacromial bursa on 06/26/2021 9:38 AM Indications: pain and diagnostic evaluation Details: 22 G 1.5 in needle  Arthrogram: No  Medications: 3 mL lidocaine 1 %; 40 mg methylPREDNISolone acetate 40 MG/ML Outcome: tolerated well, no immediate complications Procedure, treatment alternatives, risks and benefits explained, specific risks discussed. Consent was given by the patient. Immediately prior to procedure a time out was called to verify the correct patient, procedure, equipment, support staff and site/side marked as required. Patient was prepped and draped in the usual  sterile fashion.      Clinical Data: No additional findings.   Subjective: Chief Complaint  Patient presents with   Neck - Pain   Right Shoulder - Pain  The patient is a 71 year old female that we have seen before but has been sometime.  She comes in with right shoulder and right-sided neck pain that started about 2 months ago.  She cannot take anti-inflammatories due to chronic renal insufficiency.  She is also a diabetic.  She has had a history of a cervical fusion done by Dr. Christella Noa years ago.  She says she has more pain at night than in the day.  She has pain in the shoulder and her neck.  She does report occasional numbness and tingling down the right arm.  She has had a history of bilateral carpal tunnel releases by Dr. Christella Noa as well.  HPI  Review of Systems There is currently listed no headache, chest pain, shortness of breath, fever, chills, nausea, vomiting  Objective: Vital Signs: There were no vitals taken for this visit.  Physical Exam She is alert and orient x3 and in no acute distress Ortho Exam Examination of her right shoulder shows a move smoothly and fluidly but she does have a lot of pain in the subacromial outlet  and pain in the shoulder in general.  She does have neck pain with lateral bending and flexion but good strength in her upper extremities other than just some slight shoulder weakness bilaterally. Specialty Comments:  No specialty comments available.  Imaging: XR Cervical Spine 2 or 3 views  Result Date: 06/26/2021 2 views of the cervical spine show no acute findings.  There is a plate of the mid cervical spine fusing 3 levels.  There is arthritic changes above and below the fusion.  XR Shoulder Right  Result Date: 06/26/2021 3 views of the right shoulder show no acute findings.  The shoulder is well located and there is good space of the subacromial outlet.  There is also good space at the [facility] joint.    PMFS History: Patient Active Problem  List   Diagnosis Date Noted   CKD (chronic kidney disease) stage 4, GFR 15-29 ml/min (HCC) 06/18/2021   Adrenal nodule ([place]) 02/12/2021   Recurrent UTI 10/10/2020   Class 3 severe obesity due to excess calories with serious comorbidity and body mass index (BMI) of 45.0 to 49.9 in adult [facility]) 10/10/2020   Non-ischemic cardiomyopathy ([place]) 02/18/2019   Encounter for assessment of implantable cardioverter-defibrillator (ICD)    Type 2 diabetes mellitus with stage 3 chronic kidney disease, with long-term current use of insulin ([place]) 03/04/2018   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   Chronic combined systolic and diastolic congestive heart failure ([place]) 03/04/2018   Chronic renal disease, stage II 03/04/2018   Status post revision of total replacement of left knee 01/26/2018   Unilateral primary osteoarthritis, left knee 08/18/2017   Biventricular automatic implantable cardioverter defibrillator in situ 12/13/2013   ICD -  Biventricular  [employer] Dynagen X4 CRT-D Model G158 ICD in place 10/28/13 10/28/2013   HTN (hypertension) 10/03/2013   Hyperlipemia 10/03/2013   LBBB (left bundle branch block) 10/03/2013   Cardiomyopathy ([place]) 10/03/2013   DM (diabetes mellitus) ([place]) 10/03/2013   Past Medical History:  Diagnosis Date   AICD (automatic cardioverter/defibrillator) present    Arthritis    Asthma    Carpal tunnel syndrome, bilateral    CKD (chronic kidney disease)    Diabetes mellitus    Encounter for assessment of implantable cardioverter-defibrillator (ICD)    GERD (gastroesophageal reflux disease)    Hyperlipemia    Hypertension    Obesity    morbid   Sleep apnea    wears CPAP set at 12   Wears glasses     Family History  Problem Relation Age of Onset   Hypertension Mother    Stroke Mother    Heart disease Father    Heart attack Sister    Heart attack Brother     Past Surgical History:  Procedure Laterality Date   ABDOMINAL HYSTERECTOMY     BIOPSY   03/01/2019   Procedure: BIOPSY;  Surgeon: Juanita Craver, MD;  Location: [facility];  Service: Endoscopy;;   BREAST SURGERY  1968   breast mass excision   CARPAL TUNNEL RELEASE Left 07/10/2017   Procedure: CARPAL TUNNEL RELEASE;  Surgeon: Ashok Pall, MD;  Location: [place];  Service: Neurosurgery;  Laterality: Left;  left   CARPAL TUNNEL RELEASE Right 03/19/2018   Procedure: RIGHT CARPAL TUNNEL RELEASE;  Surgeon: Ashok Pall, MD;  Location: [place];  Service: Neurosurgery;  Laterality: Right;  right   CERVICAL SPINE SURGERY  2006   CHOLECYSTECTOMY     COLONOSCOPY WITH PROPOFOL N/A 03/01/2019   Procedure: COLONOSCOPY WITH  PROPOFOL;  Surgeon: Juanita Craver, MD;  Location: [facility];  Service: Endoscopy;  Laterality: N/A;   DILATION AND CURETTAGE OF UTERUS     ICD IMPLANT     JOINT REPLACEMENT     TONSILLECTOMY  1961   TOTAL KNEE ARTHROPLASTY Left 01/26/2018   Procedure: LEFT TOTAL KNEE ARTHROPLASTY;  Surgeon: Mcarthur Rossetti, MD;  Location: [place];  Service: Orthopedics;  Laterality: Left;   Social History   Occupational History   Occupation: retired  Tobacco Use   Smoking status: Never   Smokeless tobacco: Never  Vaping Use   Vaping Use: Never used  Substance and Sexual Activity   Alcohol use: No   Drug use: No   Sexual activity: Yes

## 2021-06-28 DIAGNOSIS — H401131 Primary open-angle glaucoma, bilateral, mild stage: Secondary | ICD-10-CM | POA: Diagnosis not present

## 2021-07-02 DIAGNOSIS — E785 Hyperlipidemia, unspecified: Secondary | ICD-10-CM | POA: Diagnosis not present

## 2021-07-02 DIAGNOSIS — I5032 Chronic diastolic (congestive) heart failure: Secondary | ICD-10-CM | POA: Diagnosis not present

## 2021-07-02 DIAGNOSIS — E1122 Type 2 diabetes mellitus with diabetic chronic kidney disease: Secondary | ICD-10-CM

## 2021-07-02 DIAGNOSIS — I130 Hypertensive heart and chronic kidney disease with heart failure and stage 1 through stage 4 chronic kidney disease, or unspecified chronic kidney disease: Secondary | ICD-10-CM | POA: Diagnosis not present

## 2021-07-02 DIAGNOSIS — N1832 Chronic kidney disease, stage 3b: Secondary | ICD-10-CM | POA: Diagnosis not present

## 2021-07-02 DIAGNOSIS — Z794 Long term (current) use of insulin: Secondary | ICD-10-CM

## 2021-07-03 ENCOUNTER — Ambulatory Visit: Payer: Medicare Other | Admitting: Internal Medicine

## 2021-07-03 ENCOUNTER — Ambulatory Visit (INDEPENDENT_AMBULATORY_CARE_PROVIDER_SITE_OTHER): Payer: Medicare Other

## 2021-07-03 VITALS — Ht 63.0 in | Wt 262.0 lb

## 2021-07-03 DIAGNOSIS — Z0000 Encounter for general adult medical examination without abnormal findings: Secondary | ICD-10-CM

## 2021-07-03 NOTE — Progress Notes (Signed)
I connected with Yvonne Miller today by telephone and verified that I am speaking with the correct person using two identifiers. Location patient: home Location provider: work Persons participating in the virtual visit: Jerolene, Kupfer LPN.   I discussed the limitations, risks, security and privacy concerns of performing an evaluation and management service by telephone and the availability of in person appointments. I also discussed with the patient that there may be a patient responsible charge related to this service. The patient expressed understanding and verbally consented to this telephonic visit.    Interactive audio and video telecommunications were attempted between this provider and patient, however failed, due to patient having technical difficulties OR patient did not have access to video capability.  We continued and completed visit with audio only.     Vital signs may be patient reported or missing.  Subjective:   Yvonne Miller is a 71 y.o. female who presents for Medicare Annual (Subsequent) preventive examination.  Review of Systems     Cardiac Risk Factors include: advanced age (>53men, >32 women);diabetes mellitus;dyslipidemia;hypertension;obesity (BMI >30kg/m2)     Objective:    Today's Vitals   07/03/21 0909  Weight: 262 lb (118.8 kg)  Height: $Remove'5\' 3"'ifEIZAl$  (1.6 m)   Body mass index is 46.41 kg/m.  Advanced Directives 07/03/2021 11/14/2020 06/21/2020 09/24/2019 06/16/2019 03/01/2019 09/30/2018  Does Patient Have a Medical Advance Directive? Yes Yes Yes Yes Yes - Yes  Type of Advance Directive [place];Living will - [place];Living will [place];Living will [place];Living will [place];Living will [place];Living will  Does patient want to make changes to medical advance directive? - - - - - - -  Copy of [place] in Chart?  Yes - validated most recent copy scanned in chart (See row information) - Yes - validated most recent copy scanned in chart (See row information) - Yes - validated most recent copy scanned in chart (See row information) Yes - validated most recent copy scanned in chart (See row information) Yes - validated most recent copy scanned in chart (See row information)  Would patient like information on creating a medical advance directive? - - - - - - -    Current Medications (verified) Outpatient Encounter Medications as of 07/03/2021  Medication Sig   ACCU-CHEK AVIVA PLUS test strip USE TO CHECK BLOOD SUGAR 4 TIMES A DAY   acetaminophen (TYLENOL) 500 MG tablet Take 500 mg by mouth every 6 (six) hours as needed for moderate pain.   acetaminophen-codeine (TYLENOL #3) 300-30 MG tablet Take 1-2 tablets by mouth every 8 (eight) hours as needed.   albuterol (PROVENTIL HFA;VENTOLIN HFA) 108 (90 Base) MCG/ACT inhaler Inhale 2 puffs into the lungs every 4 (four) hours as needed for wheezing or shortness of breath.    amLODipine (NORVASC) 5 MG tablet TAKE 1 TABLET BY MOUTH  DAILY   aspirin EC 81 MG tablet Take 81 mg by mouth at bedtime.    B Complex Vitamins (B COMPLEX-B12) TABS Take 1 tablet by mouth daily.   carvedilol (COREG) 25 MG tablet Take 1.5 tablets (37.5 mg total) by mouth 2 (two) times daily.   Cholecalciferol (VITAMIN D) 50 MCG (2000 UT) tablet Take 2,000 Units by mouth daily.   Chromium 1000 MCG TABS Take 1,000 mcg by mouth daily.    Cinnamon 500 MG capsule Take 500 mg by mouth daily.   estradiol (ESTRACE) 0.1 MG/GM  vaginal cream Place vaginally.   furosemide (LASIX) 40 MG tablet Take 1 tablet (40 mg total) by mouth every morning.   hydrALAZINE (APRESOLINE) 50 MG tablet TAKE 1 TABLET BY MOUTH  TWICE DAILY   Insulin Lispro Prot & Lispro (HUMALOG MIX 75/25 KWIKPEN) (75-25) 100 UNIT/ML Kwikpen Inject 45 Units into the skin at bedtime. (Patient taking differently: Inject 60 Units into the skin at  bedtime.)   nystatin (NYSTATIN) powder Apply topically 3 (three) times daily as needed. As needed   Omega-3 Fatty Acids (FISH OIL) 500 MG CAPS Take 500 mg by mouth daily.   omeprazole (PRILOSEC) 40 MG capsule Take 40 mg by mouth daily.   pravastatin (PRAVACHOL) 40 MG tablet TAKE 1 TABLET BY MOUTH  DAILY   Probiotic Product (PROBIOTIC PO) Take by mouth.   ROCKLATAN 0.02-0.005 % SOLN SMARTSIG:1 Drop(s) In Eye(s) Every Evening   TRULICITY 1.5 IW/8.0HO SOPN INJECT 1.5 MG INTO THE SKIN ONCE A WEEK. (Patient taking differently: Inject 2 mg as directed every Wednesday.)   valsartan-hydrochlorothiazide (DIOVAN-HCT) 160-25 MG tablet TAKE 1 TABLET BY MOUTH  DAILY   cephALEXin (KEFLEX) 250 MG capsule Take 250 mg by mouth daily. (Patient not taking: Reported on 07/03/2021)   fluticasone (FLONASE) 50 MCG/ACT nasal spray Place 1 spray into both nostrils daily. (Patient taking differently: Place 1 spray into both nostrils as needed for allergies.)   No facility-administered encounter medications on file as of 07/03/2021.    Allergies (verified) Other   History: Past Medical History:  Diagnosis Date   AICD (automatic cardioverter/defibrillator) present    Arthritis    Asthma    Carpal tunnel syndrome, bilateral    CKD (chronic kidney disease)    Diabetes mellitus    Encounter for assessment of implantable cardioverter-defibrillator (ICD)    GERD (gastroesophageal reflux disease)    Hyperlipemia    Hypertension    Obesity    morbid   Sleep apnea    wears CPAP set at 12   Wears glasses    Past Surgical History:  Procedure Laterality Date   ABDOMINAL HYSTERECTOMY     BIOPSY  03/01/2019   Procedure: BIOPSY;  Surgeon: Juanita Craver, MD;  Location: [facility];  Service: Endoscopy;;   BREAST SURGERY  1968   breast mass excision   CARPAL TUNNEL RELEASE Left 07/10/2017   Procedure: CARPAL TUNNEL RELEASE;  Surgeon: Ashok Pall, MD;  Location: [facility];  Service: Neurosurgery;  Laterality: Left;  left    CARPAL TUNNEL RELEASE Right 03/19/2018   Procedure: RIGHT CARPAL TUNNEL RELEASE;  Surgeon: Ashok Pall, MD;  Location: [facility];  Service: Neurosurgery;  Laterality: Right;  right   CERVICAL SPINE SURGERY  2006   CHOLECYSTECTOMY     COLONOSCOPY WITH PROPOFOL N/A 03/01/2019   Procedure: COLONOSCOPY WITH PROPOFOL;  Surgeon: Juanita Craver, MD;  Location: [facility];  Service: Endoscopy;  Laterality: N/A;   DILATION AND CURETTAGE OF UTERUS     ICD IMPLANT     JOINT REPLACEMENT     TONSILLECTOMY  1961   TOTAL KNEE ARTHROPLASTY Left 01/26/2018   Procedure: LEFT TOTAL KNEE ARTHROPLASTY;  Surgeon: Mcarthur Rossetti, MD;  Location: [facility];  Service: Orthopedics;  Laterality: Left;   Family History  Problem Relation Age of Onset   Hypertension Mother    Stroke Mother    Heart disease Father    Heart attack Sister    Heart attack Brother    Social History   Socioeconomic History   Marital status:  Married    Spouse name: Not on file   Number of children: 2   Years of education: Not on file   Highest education level: Not on file  Occupational History   Occupation: retired  Tobacco Use   Smoking status: Never   Smokeless tobacco: Never  Vaping Use   Vaping Use: Never used  Substance and Sexual Activity   Alcohol use: No   Drug use: No   Sexual activity: Yes  Other Topics Concern   Not on file  Social History Narrative   Not on file   Social Determinants of Health   Financial Resource Strain: Low Risk    Difficulty of Paying Living Expenses: Not hard at all  Food Insecurity: No Food Insecurity   Worried About Charity fundraiser in the Last Year: Never true   [place] in the Last Year: Never true  Transportation Needs: No Transportation Needs   Lack of Transportation (Medical): No   Lack of Transportation (Non-Medical): No  Physical Activity: Inactive   Days of Exercise per Week: 0 days   Minutes of Exercise per Session: 0 min  Stress: No Stress Concern Present    Feeling of Stress : Not at all  Social Connections: Not on file    Tobacco Counseling Counseling given: Not Answered   Clinical Intake:  Pre-visit preparation completed: Yes  Pain : No/denies pain     Nutritional Status: BMI > 30  Obese Nutritional Risks: None Diabetes: Yes  How often do you need to have someone help you when you read instructions, pamphlets, or other written materials from your doctor or pharmacy?: 1 - Never What is the last grade level you completed in school?: BA degree  Diabetic? Yes Nutrition Risk Assessment:  Has the patient had any N/V/D within the last 2 months?  No  Does the patient have any non-healing wounds?  No  Has the patient had any unintentional weight loss or weight gain?  No   Diabetes:  Is the patient diabetic?  Yes  If diabetic, was a CBG obtained today?  No  Did the patient bring in their glucometer from home?  No  How often do you monitor your CBG's? 3-4 daily.   Financial Strains and Diabetes Management:  Are you having any financial strains with the device, your supplies or your medication? No .  Does the patient want to be seen by Chronic Care Management for management of their diabetes?  No  Would the patient like to be referred to a Nutritionist or for Diabetic Management?  No   Diabetic Exams:  Diabetic Eye Exam: Completed 12/04/2020 Diabetic Foot Exam: Completed 10/10/2020   Interpreter Needed?: No  Information entered by :: NAllen LPN   Activities of Daily Living In your present state of health, do you have any difficulty performing the following activities: 07/03/2021  Hearing? N  Vision? N  Difficulty concentrating or making decisions? N  Walking or climbing stairs? N  Dressing or bathing? N  Doing errands, shopping? N  Preparing Food and eating ? N  Using the Toilet? N  In the past six months, have you accidently leaked urine? N  Do you have problems with loss of bowel control? N  Managing your  Medications? N  Managing your Finances? N  Housekeeping or managing your Housekeeping? N  Some recent data might be hidden    Patient Care Team: [name], MD as PCP - General (Internal Medicine) Little, Claudette Stapler,  RN as Case Science writer, Pershing Cox, RPH (Pharmacist)  Indicate any recent Medical Services you may have received from other than [facility] providers in the past year (date may be approximate).     Assessment:   This is a routine wellness examination for Yvonne Miller.  Hearing/Vision screen Vision Screening - Comments:: Regular eye exams, Dr. Shea Evans  Dietary issues and exercise activities discussed: Current Exercise Habits: The patient does not participate in regular exercise at present   Goals Addressed             This Visit's Progress    Patient Stated       07/03/2021, start back water aerobics       Depression Screen PHQ 2/9 Scores 07/03/2021 11/22/2020 06/21/2020 10/05/2019 06/16/2019 02/10/2019 09/30/2018  PHQ - 2 Score 0 0 0 0 0 0 0  PHQ- 9 Score - - - - 0 - 0    Fall Risk Fall Risk  07/03/2021 11/22/2020 06/21/2020 10/05/2019 06/16/2019  Falls in the past year? 0 0 0 1 0  Number falls in past yr: - - - 0 -  Injury with Fall? - - - 1 -  Comment - - - hurt right arm -  Risk for fall due to : Medication side effect - Medication side effect - Medication side effect  Follow up Falls evaluation completed;Education provided;Falls prevention discussed - Falls evaluation completed;Education provided;Falls prevention discussed - -    FALL RISK PREVENTION PERTAINING TO THE HOME:  Any stairs in or around the home? Yes  If so, are there any without handrails? No  Home free of loose throw rugs in walkways, pet beds, electrical cords, etc? Yes  Adequate lighting in your home to reduce risk of falls? Yes   ASSISTIVE DEVICES UTILIZED TO PREVENT FALLS:  Life alert? No  Use of a cane, walker or w/c? No  Grab bars in the bathroom? No  Shower chair or bench in shower? No  Elevated  toilet seat or a handicapped toilet? No   TIMED UP AND GO:  Was the test performed? No .      Cognitive Function:     6CIT Screen 07/03/2021 06/21/2020 06/16/2019 09/30/2018  What Year? 0 points 0 points 0 points 0 points  What month? 0 points 0 points 0 points 0 points  What time? 0 points 0 points 0 points 0 points  Count back from 20 0 points 0 points 0 points 0 points  Months in reverse 0 points 0 points 0 points 0 points  Repeat phrase 0 points 4 points 0 points 0 points  Total Score 0 4 0 0    Immunizations Immunization History  Administered Date(s) Administered   Fluad Quad(high Dose 65+) 02/07/2020, 02/12/2021   Hepatitis A 01/19/2012, 02/16/2012   Hepatitis B 01/19/2012, 02/16/2012   IPV 02/16/2012   Influenza, High Dose Seasonal PF 02/17/2016, 03/04/2018, 02/10/2019   Influenza-Unspecified 02/02/2014, 02/16/2014   MMR 04/04/1994   PFIZER(Purple Top)SARS-COV-2 Vaccination 06/11/2019, 07/02/2019, 02/21/2020   Pfizer Covid-19 Vaccine Bivalent Booster 68yrs & up 03/20/2021   Pneumococcal Conjugate-13 05/16/2020   Pneumococcal Polysaccharide-23 06/18/2021   Td 09/08/2006   Tdap 01/19/2012, 06/21/2020   Zoster Recombinat (Shingrix) 06/04/2021   Zoster, Live 12/15/2007    TDAP status: Up to date  Flu Vaccine status: Up to date  Pneumococcal vaccine status: Up to date  Covid-19 vaccine status: Completed vaccines  Qualifies for Shingles Vaccine? Yes   Zostavax completed Yes   Shingrix Completed?: needs second dose  Screening Tests Health Maintenance  Topic Date Due   Zoster Vaccines- Shingrix (2 of 2) 07/30/2021   FOOT EXAM  10/10/2021   OPHTHALMOLOGY EXAM  12/04/2021   HEMOGLOBIN A1C  12/16/2021   MAMMOGRAM  12/25/2021   COLONOSCOPY (Pts 45-72yrs Insurance coverage will need to be confirmed)  02/28/2029   TETANUS/TDAP  06/21/2030   Pneumonia Vaccine 41+ Years old  Completed   INFLUENZA VACCINE  Completed   DEXA SCAN  Completed   COVID-19 Vaccine   Completed   Hepatitis C Screening  Completed   HPV VACCINES  Aged Out    Health Maintenance  There are no preventive care reminders to display for this patient.  Colorectal cancer screening: No longer required.   Mammogram status: Completed 12/25/2020. Repeat every year  Bone Density status: Completed 09/17/2017.  Lung Cancer Screening: (Low Dose CT Chest recommended if Age 66-80 years, 30 pack-year currently smoking OR have quit w/in 15years.) does not qualify.   Lung Cancer Screening Referral: no  Additional Screening:  Hepatitis C Screening: does qualify; Completed 05/28/2012  Vision Screening: Recommended annual ophthalmology exams for early detection of glaucoma and other disorders of the eye. Is the patient up to date with their annual eye exam?  Yes  Who is the provider or what is the name of the office in which the patient attends annual eye exams? Dr. Idolina Primer If pt is not established with a provider, would they like to be referred to a provider to establish care? No .   Dental Screening: Recommended annual dental exams for proper oral hygiene  Community Resource Referral / Chronic Care Management: CRR required this visit?  No   CCM required this visit?  No      Plan:     I have personally reviewed and noted the following in the patients chart:   Medical and social history Use of alcohol, tobacco or illicit drugs  Current medications and supplements including opioid prescriptions.  Functional ability and status Nutritional status Physical activity Advanced directives List of other physicians Hospitalizations, surgeries, and ER visits in previous 12 months Vitals Screenings to include cognitive, depression, and falls Referrals and appointments  In addition, I have reviewed and discussed with patient certain preventive protocols, quality metrics, and best practice recommendations. A written personalized care plan for preventive services as well as general  preventive health recommendations were provided to patient.     Kellie Simmering, LPN   05/10/1094   Nurse Notes: none  Due to this being a virtual visit, the after visit summary with patients personalized plan was offered to patient via mail or my-chart.  patient was mailed a copy of AVS.

## 2021-07-03 NOTE — Patient Instructions (Signed)
Yvonne Miller , Thank you for taking time to come for your Medicare Wellness Visit. I appreciate your ongoing commitment to your health goals. Please review the following plan we discussed and let me know if I can assist you in the future.   Screening recommendations/referrals: Colonoscopy: not required Mammogram: completed 12/25/2020, due 12/26/2021 Bone Density: completed 09/17/2017 Recommended yearly ophthalmology/optometry visit for glaucoma screening and checkup Recommended yearly dental visit for hygiene and checkup  Vaccinations: Influenza vaccine: completed 02/12/2021, due next flu season Pneumococcal vaccine: completed 06/18/2021 Tdap vaccine: completed 06/21/2020, due 06/21/2030 Shingles vaccine: needs second dose   Covid-19: 03/20/2021, 02/21/2020, 07/02/2019, 06/11/2019  Advanced directives: copy in chart  Conditions/risks identified:  none  Next appointment: Follow up in one year for your annual wellness visit    Preventive Care 65 Years and Older, Female Preventive care refers to lifestyle choices and visits with your health care provider that can promote health and wellness. What does preventive care include? A yearly physical exam. This is also called an annual well check. Dental exams once or twice a year. Routine eye exams. Ask your health care provider how often you should have your eyes checked. Personal lifestyle choices, including: Daily care of your teeth and gums. Regular physical activity. Eating a healthy diet. Avoiding tobacco and drug use. Limiting alcohol use. Practicing safe sex. Taking low-dose aspirin every day. Taking vitamin and mineral supplements as recommended by your health care provider. What happens during an annual well check? The services and screenings done by your health care provider during your annual well check will depend on your age, overall health, lifestyle risk factors, and family history of disease. Counseling  Your health care  provider may ask you questions about your: Alcohol use. Tobacco use. Drug use. Emotional well-being. Home and relationship well-being. Sexual activity. Eating habits. History of falls. Memory and ability to understand (cognition). Work and work Statistician. Reproductive health. Screening  You may have the following tests or measurements: Height, weight, and BMI. Blood pressure. Lipid and cholesterol levels. These may be checked every 5 years, or more frequently if you are over 81 years old. Skin check. Lung cancer screening. You may have this screening every year starting at age 14 if you have a 30-pack-year history of smoking and currently smoke or have quit within the past 15 years. Fecal occult blood test (FOBT) of the stool. You may have this test every year starting at age 67. Flexible sigmoidoscopy or colonoscopy. You may have a sigmoidoscopy every 5 years or a colonoscopy every 10 years starting at age 58. Hepatitis C blood test. Hepatitis B blood test. Sexually transmitted disease (STD) testing. Diabetes screening. This is done by checking your blood sugar (glucose) after you have not eaten for a while (fasting). You may have this done every 1-3 years. Bone density scan. This is done to screen for osteoporosis. You may have this done starting at age 38. Mammogram. This may be done every 1-2 years. Talk to your health care provider about how often you should have regular mammograms. Talk with your health care provider about your test results, treatment options, and if necessary, the need for more tests. Vaccines  Your health care provider may recommend certain vaccines, such as: Influenza vaccine. This is recommended every year. Tetanus, diphtheria, and acellular pertussis (Tdap, Td) vaccine. You may need a Td booster every 10 years. Zoster vaccine. You may need this after age 57. Pneumococcal 13-valent conjugate (PCV13) vaccine. One dose is recommended after age  65. Pneumococcal polysaccharide (PPSV23) vaccine. One dose is recommended after age 77. Talk to your health care provider about which screenings and vaccines you need and how often you need them. This information is not intended to replace advice given to you by your health care provider. Make sure you discuss any questions you have with your health care provider. Document Released: 05/18/2015 Document Revised: 01/09/2016 Document Reviewed: 02/20/2015 Elsevier Interactive Patient Education  2017 [place] Prevention in the Home Falls can cause injuries. They can happen to people of all ages. There are many things you can do to make your home safe and to help prevent falls. What can I do on the outside of my home? Regularly fix the edges of walkways and driveways and fix any cracks. Remove anything that might make you trip as you walk through a door, such as a raised step or threshold. Trim any bushes or trees on the path to your home. Use bright outdoor lighting. Clear any walking paths of anything that might make someone trip, such as rocks or tools. Regularly check to see if handrails are loose or broken. Make sure that both sides of any steps have handrails. Any raised decks and porches should have guardrails on the edges. Have any leaves, snow, or ice cleared regularly. Use sand or salt on walking paths during winter. Clean up any spills in your garage right away. This includes oil or grease spills. What can I do in the bathroom? Use night lights. Install grab bars by the toilet and in the tub and shower. Do not use towel bars as grab bars. Use non-skid mats or decals in the tub or shower. If you need to sit down in the shower, use a plastic, non-slip stool. Keep the floor dry. Clean up any water that spills on the floor as soon as it happens. Remove soap buildup in the tub or shower regularly. Attach bath mats securely with double-sided non-slip rug tape. Do not have throw  rugs and other things on the floor that can make you trip. What can I do in the bedroom? Use night lights. Make sure that you have a light by your bed that is easy to reach. Do not use any sheets or blankets that are too big for your bed. They should not hang down onto the floor. Have a firm chair that has side arms. You can use this for support while you get dressed. Do not have throw rugs and other things on the floor that can make you trip. What can I do in the kitchen? Clean up any spills right away. Avoid walking on wet floors. Keep items that you use a lot in easy-to-reach places. If you need to reach something above you, use a strong step stool that has a grab bar. Keep electrical cords out of the way. Do not use floor polish or wax that makes floors slippery. If you must use wax, use non-skid floor wax. Do not have throw rugs and other things on the floor that can make you trip. What can I do with my stairs? Do not leave any items on the stairs. Make sure that there are handrails on both sides of the stairs and use them. Fix handrails that are broken or loose. Make sure that handrails are as long as the stairways. Check any carpeting to make sure that it is firmly attached to the stairs. Fix any carpet that is loose or worn. Avoid having throw rugs at the  top or bottom of the stairs. If you do have throw rugs, attach them to the floor with carpet tape. Make sure that you have a light switch at the top of the stairs and the bottom of the stairs. If you do not have them, ask someone to add them for you. What else can I do to help prevent falls? Wear shoes that: Do not have high heels. Have rubber bottoms. Are comfortable and fit you well. Are closed at the toe. Do not wear sandals. If you use a stepladder: Make sure that it is fully opened. Do not climb a closed stepladder. Make sure that both sides of the stepladder are locked into place. Ask someone to hold it for you, if  possible. Clearly mark and make sure that you can see: Any grab bars or handrails. First and last steps. Where the edge of each step is. Use tools that help you move around (mobility aids) if they are needed. These include: Canes. Walkers. Scooters. Crutches. Turn on the lights when you go into a dark area. Replace any light bulbs as soon as they burn out. Set up your furniture so you have a clear path. Avoid moving your furniture around. If any of your floors are uneven, fix them. If there are any pets around you, be aware of where they are. Review your medicines with your doctor. Some medicines can make you feel dizzy. This can increase your chance of falling. Ask your doctor what other things that you can do to help prevent falls. This information is not intended to replace advice given to you by your health care provider. Make sure you discuss any questions you have with your health care provider. Document Released: 02/15/2009 Document Revised: 09/27/2015 Document Reviewed: 05/26/2014 Elsevier Interactive Patient Education  2017 [employer].

## 2021-07-12 DIAGNOSIS — H401131 Primary open-angle glaucoma, bilateral, mild stage: Secondary | ICD-10-CM | POA: Diagnosis not present

## 2021-07-15 ENCOUNTER — Other Ambulatory Visit: Payer: Self-pay | Admitting: Internal Medicine

## 2021-07-15 DIAGNOSIS — I5042 Chronic combined systolic (congestive) and diastolic (congestive) heart failure: Secondary | ICD-10-CM

## 2021-07-17 DIAGNOSIS — I5022 Chronic systolic (congestive) heart failure: Secondary | ICD-10-CM | POA: Diagnosis not present

## 2021-07-17 DIAGNOSIS — Z95810 Presence of automatic (implantable) cardiac defibrillator: Secondary | ICD-10-CM | POA: Diagnosis not present

## 2021-07-17 DIAGNOSIS — Z4502 Encounter for adjustment and management of automatic implantable cardiac defibrillator: Secondary | ICD-10-CM | POA: Diagnosis not present

## 2021-07-21 ENCOUNTER — Encounter: Payer: Self-pay | Admitting: Cardiology

## 2021-07-27 ENCOUNTER — Other Ambulatory Visit: Payer: Self-pay | Admitting: Cardiology

## 2021-08-05 ENCOUNTER — Encounter: Payer: Self-pay | Admitting: Cardiology

## 2021-08-08 DIAGNOSIS — E1129 Type 2 diabetes mellitus with other diabetic kidney complication: Secondary | ICD-10-CM | POA: Diagnosis not present

## 2021-08-08 DIAGNOSIS — E1122 Type 2 diabetes mellitus with diabetic chronic kidney disease: Secondary | ICD-10-CM | POA: Diagnosis not present

## 2021-08-08 DIAGNOSIS — I129 Hypertensive chronic kidney disease with stage 1 through stage 4 chronic kidney disease, or unspecified chronic kidney disease: Secondary | ICD-10-CM | POA: Diagnosis not present

## 2021-08-08 DIAGNOSIS — I509 Heart failure, unspecified: Secondary | ICD-10-CM | POA: Diagnosis not present

## 2021-08-08 DIAGNOSIS — N390 Urinary tract infection, site not specified: Secondary | ICD-10-CM | POA: Diagnosis not present

## 2021-08-08 DIAGNOSIS — N1832 Chronic kidney disease, stage 3b: Secondary | ICD-10-CM | POA: Diagnosis not present

## 2021-08-08 DIAGNOSIS — R197 Diarrhea, unspecified: Secondary | ICD-10-CM | POA: Diagnosis not present

## 2021-08-08 LAB — MICROALBUMIN, URINE: Microalb, Ur: 8.5

## 2021-08-08 LAB — MICROALBUMIN / CREATININE URINE RATIO: Microalb Creat Ratio: 8

## 2021-08-09 ENCOUNTER — Other Ambulatory Visit: Payer: Self-pay | Admitting: Cardiology

## 2021-08-09 DIAGNOSIS — I5042 Chronic combined systolic (congestive) and diastolic (congestive) heart failure: Secondary | ICD-10-CM

## 2021-09-05 ENCOUNTER — Other Ambulatory Visit: Payer: Self-pay | Admitting: Cardiology

## 2021-09-10 ENCOUNTER — Encounter: Payer: Self-pay | Admitting: Internal Medicine

## 2021-09-21 ENCOUNTER — Other Ambulatory Visit: Payer: Self-pay | Admitting: Cardiology

## 2021-09-21 DIAGNOSIS — I5042 Chronic combined systolic (congestive) and diastolic (congestive) heart failure: Secondary | ICD-10-CM

## 2021-10-03 ENCOUNTER — Telehealth: Payer: Self-pay

## 2021-10-03 NOTE — Progress Notes (Signed)
    Called [name], No answer, left message of appointment on 10-04-2021 at 12:00 via telephone visit with [name], Pharm D. Notified to have all medications, supplements, blood pressure and/or blood sugar logs available during appointment and to return call if need to reschedule.   [place] Pharmacist Assistant [phone]

## 2021-10-04 ENCOUNTER — Telehealth: Payer: Medicare Other

## 2021-10-04 NOTE — Progress Notes (Deleted)
Current Barriers:  {pharmacybarriers:24917}  Pharmacist Clinical Goal(s):  Patient will {PHARMACYGOALCHOICES:24921} through collaboration with PharmD and provider.   Interventions: 1:1 collaboration with [name], MD regarding development and update of comprehensive plan of care as evidenced by provider attestation and co-signature Inter-disciplinary care team collaboration (see longitudinal plan of care) Comprehensive medication review performed; medication list updated in electronic medical record  {CCM PHARMD DISEASE STATES:25130}  Patient Goals/Self-Care Activities Patient will:  - {pharmacypatientgoals:24919}  Follow Up Plan: {CM FOLLOW UP AQTM:22633}

## 2021-10-16 DIAGNOSIS — I5022 Chronic systolic (congestive) heart failure: Secondary | ICD-10-CM | POA: Diagnosis not present

## 2021-10-16 DIAGNOSIS — Z4502 Encounter for adjustment and management of automatic implantable cardiac defibrillator: Secondary | ICD-10-CM | POA: Diagnosis not present

## 2021-10-16 DIAGNOSIS — Z95810 Presence of automatic (implantable) cardiac defibrillator: Secondary | ICD-10-CM | POA: Diagnosis not present

## 2021-10-17 ENCOUNTER — Encounter: Payer: Self-pay | Admitting: Cardiology

## 2021-10-21 ENCOUNTER — Ambulatory Visit (INDEPENDENT_AMBULATORY_CARE_PROVIDER_SITE_OTHER): Payer: Medicare Other | Admitting: Internal Medicine

## 2021-10-21 ENCOUNTER — Other Ambulatory Visit: Payer: Self-pay | Admitting: Internal Medicine

## 2021-10-21 ENCOUNTER — Other Ambulatory Visit: Payer: Self-pay | Admitting: Cardiology

## 2021-10-21 ENCOUNTER — Encounter: Payer: Self-pay | Admitting: Internal Medicine

## 2021-10-21 VITALS — BP 128/70 | HR 64 | Temp 97.8°F | Ht 63.0 in | Wt 261.0 lb

## 2021-10-21 DIAGNOSIS — Z0000 Encounter for general adult medical examination without abnormal findings: Secondary | ICD-10-CM

## 2021-10-21 DIAGNOSIS — H6121 Impacted cerumen, right ear: Secondary | ICD-10-CM | POA: Diagnosis not present

## 2021-10-21 DIAGNOSIS — R911 Solitary pulmonary nodule: Secondary | ICD-10-CM | POA: Diagnosis not present

## 2021-10-21 DIAGNOSIS — R202 Paresthesia of skin: Secondary | ICD-10-CM | POA: Diagnosis not present

## 2021-10-21 DIAGNOSIS — E1122 Type 2 diabetes mellitus with diabetic chronic kidney disease: Secondary | ICD-10-CM

## 2021-10-21 DIAGNOSIS — Z23 Encounter for immunization: Secondary | ICD-10-CM | POA: Diagnosis not present

## 2021-10-21 DIAGNOSIS — Z79899 Other long term (current) drug therapy: Secondary | ICD-10-CM | POA: Diagnosis not present

## 2021-10-21 DIAGNOSIS — Z6841 Body Mass Index (BMI) 40.0 and over, adult: Secondary | ICD-10-CM

## 2021-10-21 DIAGNOSIS — N1832 Chronic kidney disease, stage 3b: Secondary | ICD-10-CM

## 2021-10-21 DIAGNOSIS — I130 Hypertensive heart and chronic kidney disease with heart failure and stage 1 through stage 4 chronic kidney disease, or unspecified chronic kidney disease: Secondary | ICD-10-CM

## 2021-10-21 DIAGNOSIS — Z794 Long term (current) use of insulin: Secondary | ICD-10-CM | POA: Diagnosis not present

## 2021-10-21 DIAGNOSIS — E66813 Obesity, class 3: Secondary | ICD-10-CM

## 2021-10-21 DIAGNOSIS — E7800 Pure hypercholesterolemia, unspecified: Secondary | ICD-10-CM

## 2021-10-21 DIAGNOSIS — E559 Vitamin D deficiency, unspecified: Secondary | ICD-10-CM | POA: Insufficient documentation

## 2021-10-21 NOTE — Progress Notes (Signed)
I,Tianna Badgett,acting as a Education administrator for Maximino Greenland, MD.,have documented all relevant documentation on the behalf of Maximino Greenland, MD,as directed by  Maximino Greenland, MD while in the presence of Maximino Greenland, MD.  This visit occurred during the SARS-CoV-2 public health emergency.  Safety protocols were in place, including screening questions prior to the visit, additional usage of staff PPE, and extensive cleaning of exam room while observing appropriate contact time as indicated for disinfecting solutions.  Subjective:     Patient ID: Yvonne Miller , female    DOB: Dec 14, 1950 , 71 y.o.   MRN: [id number]   Chief Complaint  Patient presents with   Annual Exam    HPI  She is here today for a full physical examination. She is no longer followed by GYN. She has no specific concerns or complaints at this time. She feels much better than she did this time last year. She states she has been trying to eat better/move more.     Diabetes She presents for her follow-up diabetic visit. She has type 2 diabetes mellitus. Her disease course has been stable. There are no hypoglycemic associated symptoms. Pertinent negatives for diabetes include no blurred vision and no chest pain. There are no hypoglycemic complications. Diabetic complications include nephropathy. Risk factors for coronary artery disease include diabetes mellitus, dyslipidemia, hypertension, obesity and sedentary lifestyle. Current diabetic treatment includes insulin injections. She is compliant with treatment some of the time. She is following a generally healthy diet. She participates in exercise intermittently. Her breakfast blood glucose is taken between 8-9 am. Her breakfast blood glucose range is generally 110-130 mg/dl. An ACE inhibitor/angiotensin II receptor blocker is being taken.  Hypertension This is a chronic problem. The current episode started more than 1 year ago. The problem has been gradually improving since  onset. The problem is controlled. Pertinent negatives include no blurred vision, chest pain, palpitations or shortness of breath. Past treatments include ACE inhibitors, angiotensin blockers and diuretics. The current treatment provides moderate improvement. Compliance problems include exercise.  Hypertensive end-organ damage includes kidney disease and heart failure. Identifiable causes of hypertension include sleep apnea.     Past Medical History:  Diagnosis Date   AICD (automatic cardioverter/defibrillator) present    Arthritis    Asthma    Carpal tunnel syndrome, bilateral    CKD (chronic kidney disease)    Diabetes mellitus    Encounter for assessment of implantable cardioverter-defibrillator (ICD)    GERD (gastroesophageal reflux disease)    Hyperlipemia    Hypertension    Obesity    morbid   Sleep apnea    wears CPAP set at 12   Wears glasses      Family History  Problem Relation Age of Onset   Hypertension Mother    Stroke Mother    Heart disease Father    Heart attack Sister    Heart attack Brother      Current Outpatient Medications:    ACCU-CHEK AVIVA PLUS test strip, USE TO CHECK BLOOD SUGAR 4 TIMES A DAY, Disp: 200 strip, Rfl: 11   acetaminophen (TYLENOL) 500 MG tablet, Take 500 mg by mouth every 6 (six) hours as needed for moderate pain., Disp: , Rfl:    albuterol (PROVENTIL HFA;VENTOLIN HFA) 108 (90 Base) MCG/ACT inhaler, Inhale 2 puffs into the lungs every 4 (four) hours as needed for wheezing or shortness of breath. , Disp: , Rfl:    aspirin EC 81 MG tablet, Take 81 mg  by mouth at bedtime. , Disp: , Rfl:    B Complex Vitamins (B COMPLEX-B12) TABS, Take 1 tablet by mouth daily., Disp: , Rfl:    carvedilol (COREG) 25 MG tablet, TAKE 1 AND 1/2 TABLETS BY  MOUTH TWICE DAILY, Disp: 270 tablet, Rfl: 3   Cholecalciferol (VITAMIN D) 50 MCG (2000 UT) tablet, Take 2,000 Units by mouth daily., Disp: , Rfl:    Chromium 1000 MCG TABS, Take 1,000 mcg by mouth daily. , Disp:  , Rfl:    Cinnamon 500 MG capsule, Take 500 mg by mouth daily., Disp: , Rfl:    estradiol (ESTRACE) 0.1 MG/GM vaginal cream, Place vaginally., Disp: , Rfl:    fluticasone (FLONASE) 50 MCG/ACT nasal spray, Place 1 spray into both nostrils daily. (Patient taking differently: Place 1 spray into both nostrils as needed for allergies.), Disp: 16 g, Rfl: 2   furosemide (LASIX) 40 MG tablet, TAKE 1 TABLET BY MOUTH EVERY DAY IN THE MORNING, Disp: 90 tablet, Rfl: 0   hydrALAZINE (APRESOLINE) 50 MG tablet, TAKE 1 TABLET BY MOUTH  TWICE DAILY, Disp: 180 tablet, Rfl: 3   Insulin Lispro Prot & Lispro (HUMALOG MIX 75/25 KWIKPEN) (75-25) 100 UNIT/ML Kwikpen, Inject 45 Units into the skin at bedtime. (Patient taking differently: Inject 60 Units into the skin at bedtime.), Disp: 45 mL, Rfl: 3   nystatin (NYSTATIN) powder, Apply topically 3 (three) times daily as needed. As needed, Disp: 60 g, Rfl: 2   Omega-3 Fatty Acids (FISH OIL) 500 MG CAPS, Take 500 mg by mouth daily., Disp: , Rfl:    omeprazole (PRILOSEC) 40 MG capsule, Take 40 mg by mouth daily., Disp: , Rfl:    pravastatin (PRAVACHOL) 40 MG tablet, TAKE 1 TABLET BY MOUTH  DAILY, Disp: 90 tablet, Rfl: 3   Probiotic Product (PROBIOTIC PO), Take by mouth., Disp: , Rfl:    ROCKLATAN 0.02-0.005 % SOLN, SMARTSIG:1 Drop(s) In Eye(s) Every Evening, Disp: , Rfl:    TRULICITY 1.5 YI/5.0YD SOPN, INJECT 1.5 MG INTO THE SKIN ONCE A WEEK. (Patient taking differently: Inject 2 mg as directed every Wednesday.), Disp: 2 pen, Rfl: 2   valsartan-hydrochlorothiazide (DIOVAN-HCT) 160-25 MG tablet, TAKE 1 TABLET BY MOUTH  DAILY, Disp: 90 tablet, Rfl: 3   amLODipine (NORVASC) 5 MG tablet, TAKE 1 TABLET BY MOUTH  DAILY, Disp: 90 tablet, Rfl: 3   Allergies  Allergen Reactions   Other Other (See Comments)    NO BLOOD   . Jehovah witness    The patient states she uses post menopausal status for birth control. Last LMP was No LMP recorded. Patient has had a hysterectomy.. Negative  for Dysmenorrhea. Negative for: breast discharge, breast lump(s), breast pain and breast self exam. Associated symptoms include abnormal vaginal bleeding. Pertinent negatives include abnormal bleeding (hematology), anxiety, decreased libido, depression, difficulty falling sleep, dyspareunia, history of infertility, nocturia, sexual dysfunction, sleep disturbances, urinary incontinence, urinary urgency, vaginal discharge and vaginal itching. Diet regular.The patient states her exercise level is  moderate, 45 minutes 3 days per week.   . The patient's tobacco use is:  Social History   Tobacco Use  Smoking Status Never  Smokeless Tobacco Never  . She has been exposed to passive smoke. The patient's alcohol use is:  Social History   Substance and Sexual Activity  Alcohol Use No   Review of Systems  Constitutional: Negative.   HENT: Negative.    Eyes: Negative.  Negative for blurred vision.  Respiratory: Negative.  Negative for shortness of  breath.   Cardiovascular: Negative.  Negative for chest pain and palpitations.  Gastrointestinal: Negative.   Endocrine: Negative.   Genitourinary: Negative.   Musculoskeletal: Negative.   Skin: Negative.   Allergic/Immunologic: Negative.   Neurological: Negative.   Hematological: Negative.   Psychiatric/Behavioral: Negative.       Today's Vitals   10/21/21 0842  BP: 128/70  Pulse: 64  Temp: 97.8 F (36.6 C)  TempSrc: Oral  Weight: 261 lb (118.4 kg)  Height: $Remove'5\' 3"'UUVsJnH$  (1.6 m)  PainSc: 0-No pain   Body mass index is 46.23 kg/m.  Wt Readings from Last 3 Encounters:  10/21/21 261 lb (118.4 kg)  07/03/21 262 lb (118.8 kg)  06/21/21 268 lb 3.2 oz (121.7 kg)    Objective:  Physical Exam Vitals and nursing note reviewed.  Constitutional:      Appearance: Normal appearance.  HENT:     Head: Normocephalic and atraumatic.     Right Ear: Ear canal and external ear normal. There is impacted cerumen.     Left Ear: Tympanic membrane, ear canal  and external ear normal.     Nose: Nose normal.     Mouth/Throat:     Mouth: Mucous membranes are moist.     Pharynx: Oropharynx is clear.  Eyes:     Extraocular Movements: Extraocular movements intact.     Conjunctiva/sclera: Conjunctivae normal.     Pupils: Pupils are equal, round, and reactive to light.  Cardiovascular:     Rate and Rhythm: Normal rate and regular rhythm.     Pulses: Normal pulses.          Dorsalis pedis pulses are 2+ on the right side and 2+ on the left side.     Heart sounds: Normal heart sounds.  Pulmonary:     Effort: Pulmonary effort is normal.     Breath sounds: Normal breath sounds.  Chest:  Breasts:    Tanner Score is 5.     Right: Normal.     Left: Normal.  Abdominal:     General: Bowel sounds are normal.     Palpations: Abdomen is soft.     Comments: Large pannus, difficult to assess organomegaly due to body habitus  Genitourinary:    Comments: deferred Musculoskeletal:        General: Normal range of motion.     Cervical back: Normal range of motion and neck supple.  Feet:     Right foot:     Protective Sensation: 5 sites tested.  5 sites sensed.     Skin integrity: Dry skin present.     Toenail Condition: Right toenails are normal.     Left foot:     Protective Sensation: 5 sites tested.  5 sites sensed.     Skin integrity: Dry skin present.     Toenail Condition: Left toenails are normal.  Skin:    General: Skin is warm and dry.  Neurological:     General: No focal deficit present.     Mental Status: She is alert and oriented to person, place, and time.  Psychiatric:        Mood and Affect: Mood normal.        Behavior: Behavior normal.       Assessment And Plan:     1. Routine general medical examination at health care facility Comments: A full exam was performed. Importance of monthly self breast exams was discussed with the patient. PATIENT IS ADVISED TO GET 30-45 MINUTES REGULAR EXERCISE  NO LESS THAN FOUR TO FIVE DAYS PER WEEK  - BOTH WEIGHTBEARING EXERCISES AND AEROBIC ARE RECOMMENDED.  PATIENT IS ADVISED TO FOLLOW A HEALTHY DIET WITH AT LEAST SIX FRUITS/VEGGIES PER DAY, DECREASE INTAKE OF RED MEAT, AND TO INCREASE FISH INTAKE TO TWO DAYS PER WEEK.  MEATS/FISH SHOULD NOT BE FRIED, BAKED OR BROILED IS PREFERABLE.  IT IS ALSO IMPORTANT TO CUT BACK ON YOUR SUGAR INTAKE. PLEASE AVOID ANYTHING WITH ADDED SUGAR, CORN SYRUP OR OTHER SWEETENERS. IF YOU MUST USE A SWEETENER, YOU CAN TRY STEVIA. IT IS ALSO IMPORTANT TO AVOID ARTIFICIALLY SWEETENERS AND DIET BEVERAGES. LASTLY, I SUGGEST WEARING SPF 50 SUNSCREEN ON EXPOSED PARTS AND ESPECIALLY WHEN IN THE DIRECT SUNLIGHT FOR AN EXTENDED PERIOD OF TIME.  PLEASE AVOID FAST FOOD RESTAURANTS AND INCREASE YOUR WATER INTAKE.   2. Type 2 diabetes mellitus with stage 3b chronic kidney disease, with long-term current use of insulin ([place]) Comments: Diabetic foot exam was performed. She will f/u in 4 months for re-evaluation. I DISCUSSED WITH THE PATIENT AT LENGTH REGARDING THE GOALS OF GLYCEMIC CONTROL AND POSSIBLE LONG-TERM COMPLICATIONS.  I  ALSO STRESSED THE IMPORTANCE OF COMPLIANCE WITH HOME GLUCOSE MONITORING, DIETARY RESTRICTIONS INCLUDING AVOIDANCE OF SUGARY DRINKS/PROCESSED FOODS,  ALONG WITH REGULAR EXERCISE.  I  ALSO STRESSED THE IMPORTANCE OF ANNUAL EYE EXAMS, SELF FOOT CARE AND COMPLIANCE WITH OFFICE VISITS.  - CBC - Hemoglobin A1c - CMP14+EGFR - Lipid panel - TSH  3. Hypertensive heart and renal disease with renal failure, stage 1 through stage 4 or unspecified chronic kidney disease, with heart failure ([place]) Comments: Chronic, well controlled. She is also followed by Cardiology. She is encouraged to follow low sodium diet.   4. Right ear impacted cerumen Comments: AFTER OBTAINING VERBAL CONSENT, RIGHT EAR WAS FLUSHED BY IRRIGATION. SHE TOLERATED PROCEDURE WELL W/O ANY COMPLICATIONS. NO TM ABNORMALITIES NOTED. - Ear Lavage  5. Nodule of left lung Comments: 10mm lung nodule seen  LL lobe on CT scan June 2021. She is a non-smoker; therefore, f/u CT scan is not needed.   6. Paresthesias Comments: Her sx are possibly due to diabetic neuropathy. I will also check vitamin B12 level today.  - TSH  7. Class 3 severe obesity due to excess calories with serious comorbidity and body mass index (BMI) of 45.0 to 49.9 in adult [facility]) Comments: She is encouraged to strive for BMI less than 30 to decrease cardiac risk. Advised to aim for at least 150 minutes of exercise per week.  8. Need for shingles vaccine Comments: She was given Shingrix IM x 1. This will be billed via [employer].  - Varicella-zoster vaccine IM (Shingrix)  9. Drug therapy - Vitamin B12  Patient was given opportunity to ask questions. Patient verbalized understanding of the plan and was able to repeat key elements of the plan. All questions were answered to their satisfaction.   I, Maximino Greenland, MD, have reviewed all documentation for this visit. The documentation on 10/21/21 for the exam, diagnosis, procedures, and orders are all accurate and complete.   THE PATIENT IS ENCOURAGED TO PRACTICE SOCIAL DISTANCING DUE TO THE COVID-19 PANDEMIC.

## 2021-10-21 NOTE — Patient Instructions (Signed)

## 2021-10-22 ENCOUNTER — Encounter: Payer: Self-pay | Admitting: Internal Medicine

## 2021-10-22 LAB — CMP14+EGFR
ALT: 12 IU/L (ref 0–32)
AST: 14 IU/L (ref 0–40)
Albumin/Globulin Ratio: 1.3 (ref 1.2–2.2)
Albumin: 4.2 g/dL (ref 3.7–4.7)
Alkaline Phosphatase: 109 IU/L (ref 44–121)
BUN/Creatinine Ratio: 18 (ref 12–28)
BUN: 27 mg/dL (ref 8–27)
Bilirubin Total: 0.2 mg/dL (ref 0.0–1.2)
CO2: 25 mmol/L (ref 20–29)
Calcium: 9.3 mg/dL (ref 8.7–10.3)
Chloride: 100 mmol/L (ref 96–106)
Creatinine, Ser: 1.49 mg/dL — ABNORMAL HIGH (ref 0.57–1.00)
Globulin, Total: 3.3 g/dL (ref 1.5–4.5)
Glucose: 84 mg/dL (ref 70–99)
Potassium: 4.2 mmol/L (ref 3.5–5.2)
Sodium: 139 mmol/L (ref 134–144)
Total Protein: 7.5 g/dL (ref 6.0–8.5)
eGFR: 37 mL/min/{1.73_m2} — ABNORMAL LOW (ref >59)

## 2021-10-22 LAB — LIPID PANEL
Chol/HDL Ratio: 3 ratio (ref 0.0–4.4)
Cholesterol, Total: 135 mg/dL (ref 100–199)
HDL: 45 mg/dL (ref >39)
LDL Chol Calc (NIH): 66 mg/dL (ref 0–99)
Triglycerides: 137 mg/dL (ref 0–149)
VLDL Cholesterol Cal: 24 mg/dL (ref 5–40)

## 2021-10-22 LAB — CBC
Hematocrit: 37.9 % (ref 34.0–46.6)
Hemoglobin: 12.1 g/dL (ref 11.1–15.9)
MCH: 25.3 pg — ABNORMAL LOW (ref 26.6–33.0)
MCHC: 31.9 g/dL (ref 31.5–35.7)
MCV: 79 fL (ref 79–97)
Platelets: 369 10*3/uL (ref 150–450)
RBC: 4.78 x10E6/uL (ref 3.77–5.28)
RDW: 14.2 % (ref 11.7–15.4)
WBC: 8.7 10*3/uL (ref 3.4–10.8)

## 2021-10-22 LAB — VITAMIN B12: Vitamin B-12: 787 pg/mL (ref 232–1245)

## 2021-10-22 LAB — TSH: TSH: 1.29 u[IU]/mL (ref 0.450–4.500)

## 2021-10-22 LAB — HEMOGLOBIN A1C
Est. average glucose Bld gHb Est-mCnc: 146 mg/dL
Hgb A1c MFr Bld: 6.7 % — ABNORMAL HIGH (ref 4.8–5.6)

## 2021-10-23 ENCOUNTER — Telehealth: Payer: Medicare Other

## 2021-10-23 ENCOUNTER — Ambulatory Visit: Payer: Self-pay

## 2021-10-23 DIAGNOSIS — E7800 Pure hypercholesterolemia, unspecified: Secondary | ICD-10-CM

## 2021-10-23 DIAGNOSIS — E559 Vitamin D deficiency, unspecified: Secondary | ICD-10-CM

## 2021-10-23 DIAGNOSIS — I130 Hypertensive heart and chronic kidney disease with heart failure and stage 1 through stage 4 chronic kidney disease, or unspecified chronic kidney disease: Secondary | ICD-10-CM

## 2021-10-23 DIAGNOSIS — N1832 Chronic kidney disease, stage 3b: Secondary | ICD-10-CM

## 2021-10-23 NOTE — Telephone Encounter (Signed)
  Care Management   Follow Up Note   10/23/2021 Name: Yvonne Miller MRN: [id number] DOB: 11-Aug-1950   Referred by: [name], MD Reason for referral : Chronic Care Management (RN CM Follow up call ) and Error   An unsuccessful telephone outreach was attempted today. The patient was referred to the case management team for assistance with care management and care coordination.   Follow Up Plan: No further follow up required: patient's chronic conditions are stable at this time  Barb Merino, RN, BSN, [place] [facility]  Direct Phone: [phone]   This encounter was created in error - please disregard.

## 2021-10-23 NOTE — Chronic Care Management (AMB) (Signed)
  Care Management   Follow Up Note   10/23/2021 Name: Yvonne Miller MRN: [id number] DOB: 11-Apr-1951   Referred by: [name], MD Reason for referral : No chief complaint on file.   An unsuccessful telephone outreach was attempted today. The patient was referred to the case management team for assistance with care management and care coordination.   Follow Up Plan: No further follow up required: patient's chronic conditions are stable at this time, no further RN CM follow up needed   Barb Merino, RN, BSN, CCM Care Management Coordinator [place] [facility]  Direct Phone: [phone]

## 2021-11-11 ENCOUNTER — Telehealth: Payer: Self-pay

## 2021-11-11 NOTE — Chronic Care Management (AMB) (Signed)
Chronic Care Management Pharmacy Assistant   Miller: Yvonne Miller  MRN: [id number] DOB: 1950/10/31  Reason for Encounter: Disease State/ Diabetes   Recent office visits:  10-21-2021 Yvonne Chard, MD. MCH= 25.3. Creatinine= 1.49, eGFR= 37. A1C= 6.7. STOP tylenol #3 and keflex.  07-03-2021 Yvonne Simmering, LPN. Medicare wellness visit.  06-24-2021 Little, Claudette Stapler, RN (CCM).  06-18-2021 Yvonne Miller. Creatinine= 1.34, eGFR= 42. A1C= 8.1. Referral placed to orthopedic surgery and dermatology.  Recent consult visits:  07-12-2021 Yvonne Miller. Yvonne Miller (Optometry). Unable to view encounter.  06-28-2021 Yvonne Miller. Yvonne Miller (Optometry). Unable to view encounter.  06-26-2021 Yvonne Rossetti, MD (Orthopedic surgery). XR Cervical Spine 2 or 3 views, XR Shoulder Right and large joint injection completed.  06-21-2021 Yvonne Prows, MD (Cardiology). STOP spironolactone. EKG completed.  Hospital visits:  None in previous 6 months  Medications: Outpatient Encounter Medications as of 11/11/2021  Medication Sig   ACCU-CHEK AVIVA PLUS test strip USE TO CHECK BLOOD SUGAR 4 TIMES A DAY   acetaminophen (TYLENOL) 500 MG tablet Take 500 mg by mouth every 6 (six) hours as needed for moderate pain.   albuterol (PROVENTIL HFA;VENTOLIN HFA) 108 (90 Base) MCG/ACT inhaler Inhale 2 puffs into the lungs every 4 (four) hours as needed for wheezing or shortness of breath.    amLODipine (NORVASC) 5 MG tablet TAKE 1 TABLET BY MOUTH  DAILY   aspirin EC 81 MG tablet Take 81 mg by mouth at bedtime.    B Complex Vitamins (B COMPLEX-B12) TABS Take 1 tablet by mouth daily.   carvedilol (COREG) 25 MG tablet TAKE 1 AND 1/2 TABLETS BY  MOUTH TWICE DAILY   Cholecalciferol (VITAMIN D) 50 MCG (2000 UT) tablet Take 2,000 Units by mouth daily.   Chromium 1000 MCG TABS Take 1,000 mcg by mouth daily.    Cinnamon 500 MG capsule Take 500 mg by mouth daily.   estradiol (ESTRACE) 0.1 MG/GM vaginal cream Place  vaginally.   fluticasone (FLONASE) 50 MCG/ACT nasal spray Place 1 spray into both nostrils daily. (Patient taking differently: Place 1 spray into both nostrils as needed for allergies.)   furosemide (LASIX) 40 MG tablet TAKE 1 TABLET BY MOUTH EVERY DAY IN THE MORNING   hydrALAZINE (APRESOLINE) 50 MG tablet TAKE 1 TABLET BY MOUTH  TWICE DAILY   Insulin Lispro Prot & Lispro (HUMALOG MIX 75/25 KWIKPEN) (75-25) 100 UNIT/ML Kwikpen Inject 45 Units into the skin at bedtime. (Patient taking differently: Inject 60 Units into the skin at bedtime.)   nystatin (NYSTATIN) powder Apply topically 3 (three) times daily as needed. As needed   Omega-3 Fatty Acids (FISH OIL) 500 MG CAPS Take 500 mg by mouth daily.   omeprazole (PRILOSEC) 40 MG capsule Take 40 mg by mouth daily.   pravastatin (PRAVACHOL) 40 MG tablet TAKE 1 TABLET BY MOUTH  DAILY   Probiotic Product (PROBIOTIC PO) Take by mouth.   ROCKLATAN 0.02-0.005 % SOLN SMARTSIG:1 Drop(s) In Eye(s) Every Evening   TRULICITY 1.5 DP/9.4LM SOPN INJECT 1.5 MG INTO THE SKIN ONCE A WEEK. (Patient taking differently: Inject 2 mg as directed every Wednesday.)   valsartan-hydrochlorothiazide (DIOVAN-HCT) 160-25 MG tablet TAKE 1 TABLET BY MOUTH  DAILY   No facility-administered encounter medications on file as of 11/11/2021.   Recent Relevant Labs: Lab Results  Component Value Date/Time   HGBA1C 6.7 (H) 10/21/2021 09:59 AM   HGBA1C 8.1 (H) 06/18/2021 09:36 AM   MICROALBUR 8.5 08/08/2021 12:00 AM  MICROALBUR 30 10/10/2020 12:38 PM   MICROALBUR 30 10/05/2019 12:07 PM    Kidney Function Lab Results  Component Value Date/Time   CREATININE 1.49 (H) 10/21/2021 09:59 AM   CREATININE 1.34 (H) 06/18/2021 09:36 AM   GFRNONAA 34 (L) 11/14/2020 10:04 AM   GFRAA 45 (L) 06/21/2020 11:37 AM    Current antihyperglycemic regimen:  Trulicity 1.5 mg weekly (Patient has plenty supply) Humalog 45 units at bedtime  What recent interventions/DTPs have been made to improve  glycemic control:  Educated on A1c and blood sugar goals; Prevention and management of hypoglycemic episodes; Benefits of routine self-monitoring of blood sugar; -[name] is going to carry hard candy, nabs or a snack when you have a long day  -Counseled to check feet daily and get yearly eye exams -Recommended to continue current medication  Have there been any recent hospitalizations or ED visits since last visit with CPP? No  Patient denies hypoglycemic symptoms  Patient denies hyperglycemic symptoms  How often are you checking your blood sugar? twice daily  What are your blood sugars ranging?  Fasting: 120, 95 Before meals: None After meals: 164 Bedtime: 167  During the week, how often does your blood glucose drop below 70? Never  Are you checking your feet daily/regularly? Daily  Adherence Review: Is the patient currently on a STATIN medication? Yes Is the patient currently on ACE/ARB medication? Yes Does the patient have >5 day gap between last estimated fill dates? No   Care Gaps: Yearly foot exam overdue  Star Rating Drugs: Trulicity 1.5 mg- Patient assistance Pravastatin 40 mg- Last filled 08-24-2021 90 DS [facility] Valsartan/HCTZ 160- 25 mg- Last filled 08-24-2021 90 DS [place] Clinical Pharmacist Assistant [phone]

## 2021-11-18 DIAGNOSIS — R911 Solitary pulmonary nodule: Secondary | ICD-10-CM | POA: Insufficient documentation

## 2021-11-18 DIAGNOSIS — H6121 Impacted cerumen, right ear: Secondary | ICD-10-CM | POA: Insufficient documentation

## 2021-11-18 DIAGNOSIS — R202 Paresthesia of skin: Secondary | ICD-10-CM | POA: Insufficient documentation

## 2021-12-17 DIAGNOSIS — H401131 Primary open-angle glaucoma, bilateral, mild stage: Secondary | ICD-10-CM | POA: Diagnosis not present

## 2021-12-17 DIAGNOSIS — E119 Type 2 diabetes mellitus without complications: Secondary | ICD-10-CM | POA: Diagnosis not present

## 2021-12-17 LAB — HM DIABETES EYE EXAM

## 2021-12-26 DIAGNOSIS — Z1231 Encounter for screening mammogram for malignant neoplasm of breast: Secondary | ICD-10-CM | POA: Diagnosis not present

## 2021-12-26 LAB — HM MAMMOGRAPHY

## 2022-01-03 ENCOUNTER — Encounter: Payer: Self-pay | Admitting: Cardiology

## 2022-01-03 ENCOUNTER — Ambulatory Visit: Payer: Medicare Other | Admitting: Cardiology

## 2022-01-03 VITALS — BP 100/64 | HR 72 | Temp 97.9°F | Resp 14 | Ht 63.0 in | Wt 263.8 lb

## 2022-01-03 DIAGNOSIS — I10 Essential (primary) hypertension: Secondary | ICD-10-CM

## 2022-01-03 DIAGNOSIS — I428 Other cardiomyopathies: Secondary | ICD-10-CM

## 2022-01-03 DIAGNOSIS — Z95810 Presence of automatic (implantable) cardiac defibrillator: Secondary | ICD-10-CM | POA: Diagnosis not present

## 2022-01-03 DIAGNOSIS — N1831 Chronic kidney disease, stage 3a: Secondary | ICD-10-CM | POA: Diagnosis not present

## 2022-01-03 DIAGNOSIS — I129 Hypertensive chronic kidney disease with stage 1 through stage 4 chronic kidney disease, or unspecified chronic kidney disease: Secondary | ICD-10-CM | POA: Diagnosis not present

## 2022-01-03 NOTE — Progress Notes (Signed)
Primary Physician/Referring:  [name], MD  Patient ID: Yvonne Miller, female    DOB: October 23, 1950, 71 y.o.   MRN: [id number]  Chief Complaint  Patient presents with   Congestive Heart Failure   Hypertension   Follow-up    6 months    HPI:    Yvonne Miller  is a 71 y.o. 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  She also has morbid obesity, OSA not on CPAP recently, hypertension, DM with stage 3b CKD, hyperlipidemia, degenerative joint disease and recent diagnosis of diabetic gastroparesis.  She presents for a 72-monthoffice visit, presently doing well and essentially remains asymptomatic without dyspnea, PND or orthopnea or leg edema.  She is developing diabetic peripheral neuropathy and tingling and numbness especially at night that bothers her at night.   Past Medical History:  Diagnosis Date   AICD (automatic cardioverter/defibrillator) present    Arthritis    Asthma    Carpal tunnel syndrome, bilateral    CKD (chronic kidney disease)    Diabetes mellitus    Encounter for assessment of implantable cardioverter-defibrillator (ICD)    GERD (gastroesophageal reflux disease)    Hyperlipemia    Hypertension    Obesity    morbid   Sleep apnea    wears CPAP set at 12   Wears glasses    Past Surgical History:  Procedure Laterality Date   ABDOMINAL HYSTERECTOMY     BIOPSY  03/01/2019   Procedure: BIOPSY;  Surgeon: MJuanita Craver MD;  Location: [facility];  Service: Endoscopy;;   BREAST SURGERY  1968   breast mass excision   CARPAL TUNNEL RELEASE Left 07/10/2017   Procedure: CARPAL TUNNEL RELEASE;  Surgeon: CAshok Pall MD;  [place] [facility] [place]  Service: Neurosurgery;  Laterality: Left;  left   CARPAL TUNNEL RELEASE Right 03/19/2018   Procedure: RIGHT CARPAL TUNNEL RELEASE;  Surgeon: CAshok Pall MD;  [place] [facility]   Service: Neurosurgery;  Laterality: Right;  right   CERVICAL SPINE SURGERY  2006   CHOLECYSTECTOMY     COLONOSCOPY WITH PROPOFOL N/A 03/01/2019   Procedure: COLONOSCOPY WITH PROPOFOL;  Surgeon: MJuanita Craver MD;  Location: [facility];  Service: Endoscopy;  Laterality: N/A;   DILATION AND CURETTAGE OF UTERUS     ICD IMPLANT     JOINT REPLACEMENT     TONSILLECTOMY  1961   TOTAL KNEE ARTHROPLASTY Left 01/26/2018   Procedure: LEFT TOTAL KNEE ARTHROPLASTY;  Surgeon: BMcarthur Rossetti MD;  [place] [facility]  Service: Orthopedics;  Laterality: Left;   Family History  Problem Relation Age of Onset   Hypertension Mother    Stroke Mother    Heart disease Father    Heart attack Sister    Heart attack Brother     Social History   Tobacco Use   Smoking status: Never   Smokeless tobacco: Never  Substance Use Topics   Alcohol use: No   Marital Status: Married  ROS  Review of Systems  Cardiovascular:  Negative for chest pain, dyspnea on exertion and leg swelling.  Respiratory:  Positive for snoring (not on cpap).    Objective  Blood pressure 100/64, pulse 72, temperature 97.9 F (36.6 C), temperature source Temporal, resp. rate 14, height '5\' 3"'$  (1.6 m), weight 263 lb 12.8 oz (119.7 kg), SpO2 96 %.     01/03/2022    8:48 AM 10/21/2021  8:42 AM 07/03/2021    9:09 AM  Vitals with BMI  Height '5\' 3"'$  '5\' 3"'$  '5\' 3"'$   Weight 263 lbs 13 oz 261 lbs 262 lbs  BMI 46.74 76.54 65.03  Systolic 546 568   Diastolic 64 70   Pulse 72 64      Physical Exam Constitutional:      Appearance: She is well-developed.     Comments: Morbidly Obese   Neck:     Thyroid: No thyromegaly.     Vascular: No JVD.     Comments: Short neck and difficult to evaluate JVP  Cardiovascular:     Rate and Rhythm: Normal rate and regular rhythm.     Pulses: Normal pulses and intact distal pulses.          Carotid pulses are 2+ on the right side and 2+ on the left side.    Heart sounds: No murmur heard.    No  gallop.     Comments: Femoral and popliteal pulse difficult to feel due to patient's body habitus.  Pulmonary:     Effort: Pulmonary effort is normal. No accessory muscle usage or respiratory distress.     Breath sounds: Normal breath sounds.  Abdominal:     General: Bowel sounds are normal.     Palpations: Abdomen is soft.     Comments: Obese. Pannus present   Musculoskeletal:     Right lower leg: No edema.     Left lower leg: No edema.  Skin:    Capillary Refill: Capillary refill takes less than 2 seconds.   Laboratory examination:   Recent Labs    02/12/21 0910 06/18/21 0936 10/21/21 0959  NA 138 139 139  K 4.9 4.4 4.2  CL 97 100 100  CO2 '28 27 25  '$ GLUCOSE 161* 80 84  BUN '27 22 27  '$ CREATININE 1.49* 1.34* 1.49*  CALCIUM 9.5 9.2 9.3    CrCl cannot be calculated (Patient's most recent lab result is older than the maximum 21 days allowed.).     Latest Ref Rng & Units 10/21/2021    9:59 AM 06/18/2021    9:36 AM 02/12/2021    9:10 AM  CMP  Glucose 70 - 99 mg/dL 84  80  161   BUN 8 - 27 mg/dL '27  22  27   '$ Creatinine 0.57 - 1.00 mg/dL 1.49  1.34  1.49   Sodium 134 - 144 mmol/L 139  139  138   Potassium 3.5 - 5.2 mmol/L 4.2  4.4  4.9   Chloride 96 - 106 mmol/L 100  100  97   CO2 20 - 29 mmol/L '25  27  28   '$ Calcium 8.7 - 10.3 mg/dL 9.3  9.2  9.5   Total Protein 6.0 - 8.5 g/dL 7.5  7.2  7.0   Total Bilirubin 0.0 - 1.2 mg/dL 0.2  0.2  0.2   Alkaline Phos 44 - 121 IU/L 109  103  109   AST 0 - 40 IU/L '14  13  13   '$ ALT 0 - 32 IU/L '12  12  10       '$ Latest Ref Rng & Units 10/21/2021    9:59 AM 11/14/2020   10:04 AM 06/21/2020   11:37 AM  CBC  WBC 3.4 - 10.8 x10E3/uL 8.7  10.1  10.5   Hemoglobin 11.1 - 15.9 g/dL 12.1  12.0  11.6   Hematocrit 34.0 - 46.6 % 37.9  37.7  36.8  Platelets 150 - 450 x10E3/uL 369  300  333    Lipid Panel     Component Value Date/Time   CHOL 135 10/21/2021 0959   TRIG 137 10/21/2021 0959   HDL 45 10/21/2021 0959   CHOLHDL 3.0 10/21/2021 0959    CHOLHDL 3.8 04/22/2009 0603   VLDL 45 (H) 04/22/2009 0603   LDLCALC 66 10/21/2021 0959   HEMOGLOBIN A1C Lab Results  Component Value Date   HGBA1C 6.7 (H) 10/21/2021   MPG 165.68 01/15/2018   TSH Recent Labs    10/21/21 0959  TSH 1.290    BNP (last 3 results) No results for input(s): "BNP" in the last 8760 hours.  Medications and allergies   Allergies  Allergen Reactions   Other Other (See Comments)    NO BLOOD   . Jehovah witness      Current Outpatient Medications:    ACCU-CHEK AVIVA PLUS test strip, USE TO CHECK BLOOD SUGAR 4 TIMES A DAY, Disp: 200 strip, Rfl: 11   acetaminophen (TYLENOL) 500 MG tablet, Take 500 mg by mouth every 6 (six) hours as needed for moderate pain., Disp: , Rfl:    albuterol (PROVENTIL HFA;VENTOLIN HFA) 108 (90 Base) MCG/ACT inhaler, Inhale 2 puffs into the lungs every 4 (four) hours as needed for wheezing or shortness of breath. , Disp: , Rfl:    amLODipine (NORVASC) 5 MG tablet, TAKE 1 TABLET BY MOUTH  DAILY, Disp: 90 tablet, Rfl: 3   aspirin EC 81 MG tablet, Take 81 mg by mouth at bedtime. , Disp: , Rfl:    B Complex Vitamins (B COMPLEX-B12) TABS, Take 1 tablet by mouth daily., Disp: , Rfl:    carvedilol (COREG) 25 MG tablet, TAKE 1 AND 1/2 TABLETS BY  MOUTH TWICE DAILY, Disp: 270 tablet, Rfl: 3   Cholecalciferol (VITAMIN D) 50 MCG (2000 UT) tablet, Take 2,000 Units by mouth daily., Disp: , Rfl:    Chromium 1000 MCG TABS, Take 1,000 mcg by mouth daily. , Disp: , Rfl:    Cinnamon 500 MG capsule, Take 500 mg by mouth daily., Disp: , Rfl:    estradiol (ESTRACE) 0.1 MG/GM vaginal cream, Place vaginally., Disp: , Rfl:    fluticasone (FLONASE) 50 MCG/ACT nasal spray, Place 1 spray into both nostrils daily. (Patient taking differently: Place 1 spray into both nostrils as needed for allergies.), Disp: 16 g, Rfl: 2   furosemide (LASIX) 40 MG tablet, TAKE 1 TABLET BY MOUTH EVERY DAY IN THE MORNING, Disp: 90 tablet, Rfl: 0   hydrALAZINE (APRESOLINE) 50  MG tablet, TAKE 1 TABLET BY MOUTH  TWICE DAILY, Disp: 180 tablet, Rfl: 3   Insulin Lispro Prot & Lispro (HUMALOG MIX 75/25 KWIKPEN) (75-25) 100 UNIT/ML Kwikpen, Inject 45 Units into the skin at bedtime. (Patient taking differently: Inject 60 Units into the skin at bedtime.), Disp: 45 mL, Rfl: 3   nystatin (NYSTATIN) powder, Apply topically 3 (three) times daily as needed. As needed, Disp: 60 g, Rfl: 2   Omega-3 Fatty Acids (FISH OIL) 500 MG CAPS, Take 500 mg by mouth daily., Disp: , Rfl:    omeprazole (PRILOSEC) 40 MG capsule, Take 40 mg by mouth daily., Disp: , Rfl:    pravastatin (PRAVACHOL) 40 MG tablet, TAKE 1 TABLET BY MOUTH  DAILY, Disp: 90 tablet, Rfl: 3   Probiotic Product (PROBIOTIC PO), Take by mouth., Disp: , Rfl:    TRULICITY 1.5 UX/3.2TF SOPN, INJECT 1.5 MG INTO THE SKIN ONCE A WEEK. (Patient taking differently: Inject  2 mg as directed every Wednesday.), Disp: 2 pen, Rfl: 2   valsartan-hydrochlorothiazide (DIOVAN-HCT) 160-25 MG tablet, TAKE 1 TABLET BY MOUTH  DAILY, Disp: 90 tablet, Rfl: 3   VYZULTA 0.024 % SOLN, Apply 1-2 drops to eye daily., Disp: , Rfl:    Radiology:   No results found.  Cardiac Studies:   Sleep study 2010 (sleep apnea-has a CPAP but doesn't use it every night).  Coronary Angiography  R+ L 03/04/11: and in 2010 Normal coronary arteries. Moderate pulmonary hypertension.  PCV ECHOCARDIOGRAM COMPLETE 10/30/2020 Left ventricle cavity is normal in size and wall thickness. Normal global wall motion. Normal LV systolic function with visual EF 50-55%. Doppler evidence of grade I (impaired) diastolic dysfunction, normal LAP. Left atrial cavity is mildly dilated. Trileaflet aortic valve.  Trace aortic stenosis. Trace aortic regurgitation. Mild (Grade I) mitral regurgitation. Mild to moderate tricuspid regurgitation. Estimated pulmonary artery systolic pressure 31 mmHg. No significant change compared to previous study in 2016.   EKG EKG 01/02/2022: Atrially sensed  and ventricularly paced rhythm at the rate of 71 bpm.  No further analysis.  No change from EKG 06/21/2021.  Assessment     ICD-10-CM   1. Essential hypertension  I10 EKG 12-Lead    2. Non-ischemic cardiomyopathy ([place])  I42.8     3. ICD: Biventricular  [employer] Dynagen X4 CRT-D Model G158 ICD in place 10/28/13  Z95.810     4. Stage 3a chronic kidney disease ([place])  N18.31       Orders Placed This Encounter  Procedures   EKG 12-Lead     No orders of the defined types were placed in this encounter.    Medications Discontinued During This Encounter  Medication Reason   ROCKLATAN 0.02-0.005 % SOLN     Recommendations:   Yvonne Miller  is a 71 y.o.  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  She also has morbid obesity, OSA not on CPAP recently, hypertension, DM with stage 3a-b CKD, hyperlipidemia, degenerative joint disease and recent diagnosis of diabetic gastroparesis.  She presents for a 77-monthoffice visit, presently doing well and essentially remains asymptomatic without dyspnea, PND or orthopnea or leg edema.  She is developing diabetic peripheral neuropathy and tingling and numbness especially at night that bothers her at night.  Advised her to try B1, B6 and B12 supplements for now and Neurontin could also be an option to try.  There is no clinical evidence of peripheral arterial disease.  Today, there is no clinical evidence of heart failure, blood pressure is well controlled, diabetes is also improved and well-controlled.  Chronic stage IIIa kidney disease has remained stable.  Reviewed her ICD, she has 2 years of battery life left.  No arrhythmias.   JAdrian Prows MD, FMount [facility]9/05/2021, 9:05 AM Office: [phone]: [phone]

## 2022-01-09 ENCOUNTER — Encounter: Payer: Self-pay | Admitting: Internal Medicine

## 2022-01-15 DIAGNOSIS — Z4502 Encounter for adjustment and management of automatic implantable cardiac defibrillator: Secondary | ICD-10-CM | POA: Diagnosis not present

## 2022-01-15 DIAGNOSIS — Z45018 Encounter for adjustment and management of other part of cardiac pacemaker: Secondary | ICD-10-CM | POA: Diagnosis not present

## 2022-01-15 DIAGNOSIS — I5022 Chronic systolic (congestive) heart failure: Secondary | ICD-10-CM | POA: Diagnosis not present

## 2022-01-18 ENCOUNTER — Other Ambulatory Visit: Payer: Self-pay | Admitting: Cardiology

## 2022-01-18 DIAGNOSIS — I5042 Chronic combined systolic (congestive) and diastolic (congestive) heart failure: Secondary | ICD-10-CM

## 2022-01-20 ENCOUNTER — Telehealth: Payer: Self-pay

## 2022-01-20 NOTE — Chronic Care Management (AMB) (Signed)
Chronic Care Management Pharmacy Assistant   Name: Yvonne Miller  MRN: [id number] DOB: 06/24/1950  Reason for Encounter: Disease State/ Diabetes  Recent office visits:  None  Recent consult visits:  01-03-2022 Adrian Prows, MD (Cardiology). STOP amlodipine, aspirin and ROCKLATAN.  Hospital visits:  None in previous 6 months  Medications: Outpatient Encounter Medications as of 01/20/2022  Medication Sig   ACCU-CHEK AVIVA PLUS test strip USE TO CHECK BLOOD SUGAR 4 TIMES A DAY   acetaminophen (TYLENOL) 500 MG tablet Take 500 mg by mouth every 6 (six) hours as needed for moderate pain.   albuterol (PROVENTIL HFA;VENTOLIN HFA) 108 (90 Base) MCG/ACT inhaler Inhale 2 puffs into the lungs every 4 (four) hours as needed for wheezing or shortness of breath.    B Complex Vitamins (B COMPLEX-B12) TABS Take 1 tablet by mouth daily.   carvedilol (COREG) 25 MG tablet TAKE 1 AND 1/2 TABLETS BY  MOUTH TWICE DAILY   Cholecalciferol (VITAMIN D) 50 MCG (2000 UT) tablet Take 2,000 Units by mouth daily.   Chromium 1000 MCG TABS Take 1,000 mcg by mouth daily.    Cinnamon 500 MG capsule Take 500 mg by mouth daily.   estradiol (ESTRACE) 0.1 MG/GM vaginal cream Place vaginally.   fluticasone (FLONASE) 50 MCG/ACT nasal spray Place 1 spray into both nostrils daily. (Patient taking differently: Place 1 spray into both nostrils as needed for allergies.)   furosemide (LASIX) 40 MG tablet TAKE 1 TABLET BY MOUTH EVERY DAY IN THE MORNING   hydrALAZINE (APRESOLINE) 50 MG tablet TAKE 1 TABLET BY MOUTH  TWICE DAILY   Insulin Lispro Prot & Lispro (HUMALOG MIX 75/25 KWIKPEN) (75-25) 100 UNIT/ML Kwikpen Inject 45 Units into the skin at bedtime. (Patient taking differently: Inject 60 Units into the skin at bedtime.)   nystatin (NYSTATIN) powder Apply topically 3 (three) times daily as needed. As needed   Omega-3 Fatty Acids (FISH OIL) 500 MG CAPS Take 500 mg by mouth daily.   omeprazole (PRILOSEC) 40 MG capsule Take 40  mg by mouth daily.   pravastatin (PRAVACHOL) 40 MG tablet TAKE 1 TABLET BY MOUTH  DAILY   Probiotic Product (PROBIOTIC PO) Take by mouth.   TRULICITY 1.5 IR/4.4RX SOPN INJECT 1.5 MG INTO THE SKIN ONCE A WEEK. (Patient taking differently: Inject 2 mg as directed every Wednesday.)   valsartan-hydrochlorothiazide (DIOVAN-HCT) 160-25 MG tablet TAKE 1 TABLET BY MOUTH  DAILY   VYZULTA 0.024 % SOLN Apply 1-2 drops to eye daily.   No facility-administered encounter medications on file as of 01/20/2022.  Recent Relevant Labs: Lab Results  Component Value Date/Time   HGBA1C 6.7 (H) 10/21/2021 09:59 AM   HGBA1C 8.1 (H) 06/18/2021 09:36 AM   MICROALBUR 8.5 08/08/2021 12:00 AM   MICROALBUR 30 10/10/2020 12:38 PM   MICROALBUR 30 10/05/2019 12:07 PM    Kidney Function Lab Results  Component Value Date/Time   CREATININE 1.49 (H) 10/21/2021 09:59 AM   CREATININE 1.34 (H) 06/18/2021 09:36 AM   GFRNONAA 34 (L) 11/14/2020 10:04 AM   GFRAA 45 (L) 06/21/2020 11:37 AM    Adherence Review: Is the patient currently on a STATIN medication? Yes Is the patient currently on ACE/ARB medication? Yes Does the patient have >5 day gap between last estimated fill dates? No  01-20-2022: 1st attempt left VM 01-21-2022: 2nd attempt left VM 01-22-2022: 3rd attempt left VM  Care Gaps: Mammogram overdue Covid booster overdue Flu vaccine overdue  Star Rating Drugs: Trulicity 1.5 mg- Patient assistance  Pravastatin 40 mg- Last filled 11-12-2021 100 DS [facility] Valsartan/HCTZ 160- 25 mg- Last filled 11-12-2021 100 DS [place] Clinical Pharmacist Assistant [phone]

## 2022-01-22 ENCOUNTER — Telehealth: Payer: Medicare Other

## 2022-02-13 IMAGING — US US RENAL
1 series · 14 of 25 positions shown · non-contrast
Comparison: None.

CLINICAL DATA: 70-year-old female with decreased renal function and
frequent urinary tract infections.

EXAM:
RENAL / URINARY TRACT ULTRASOUND COMPLETE

[Series 1: us renal · 0.24mm/px · 14 of 34 slices shown]
[im 1/34]
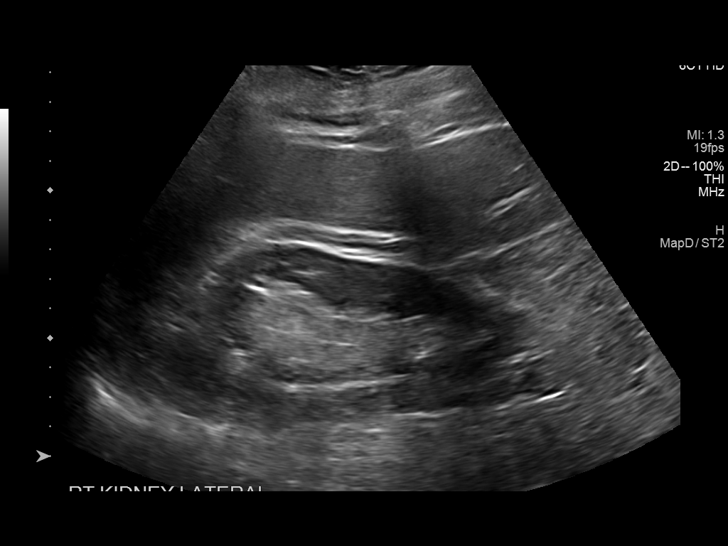
[im 3/34]
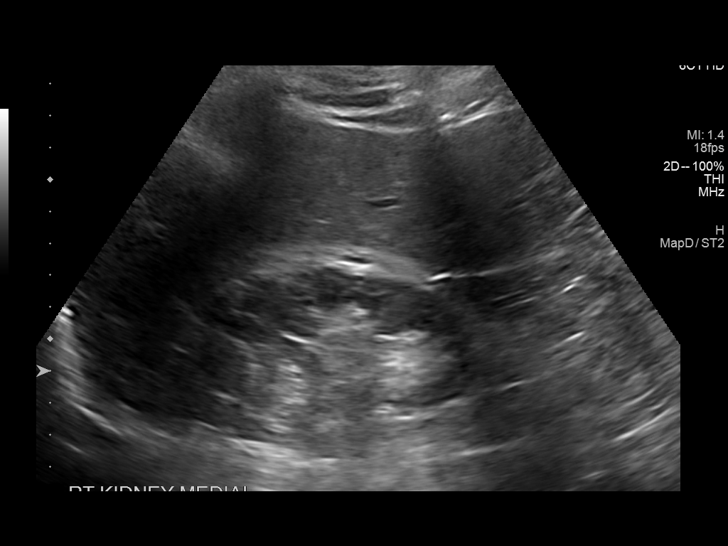
[im 6/34]
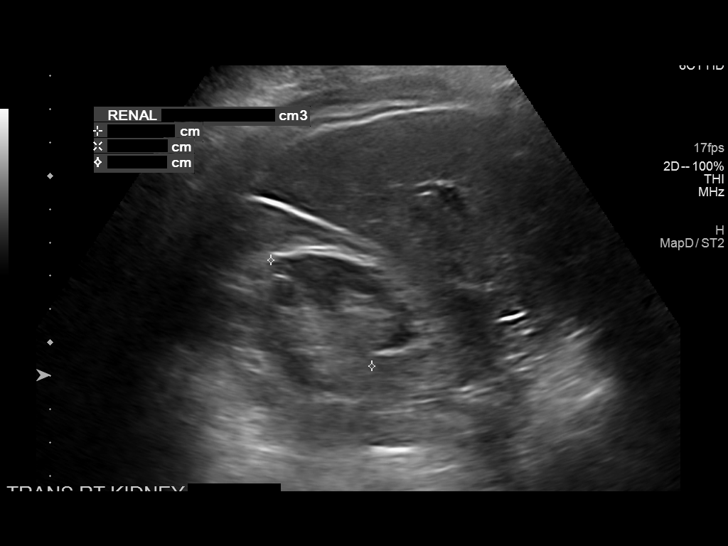
[im 9/34]
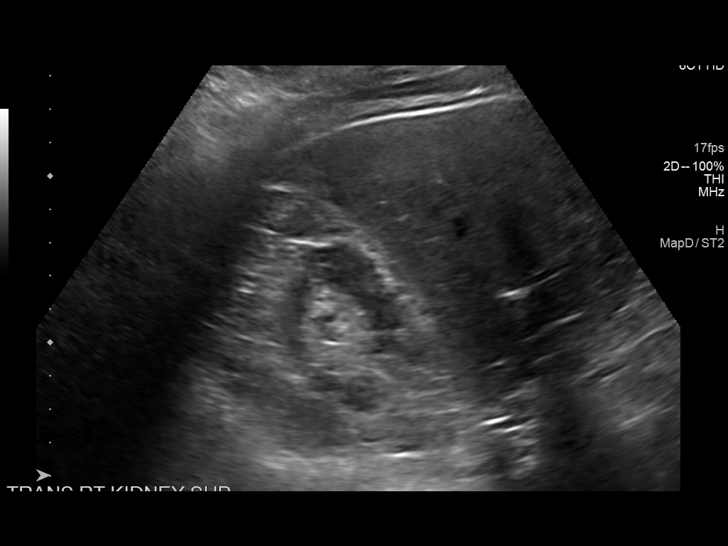
[im 12/34]
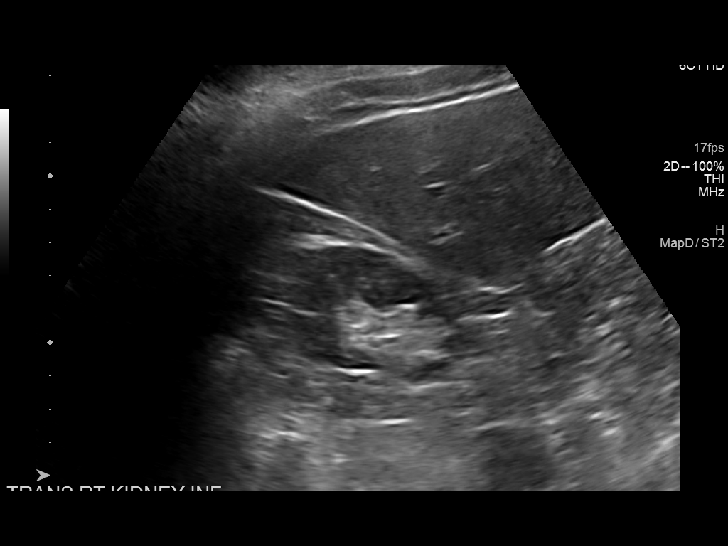
[im 13/34]
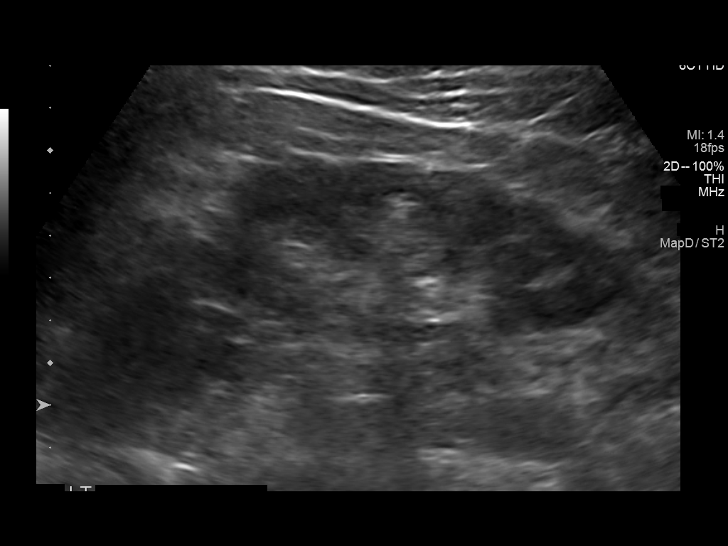
[im 16/34]
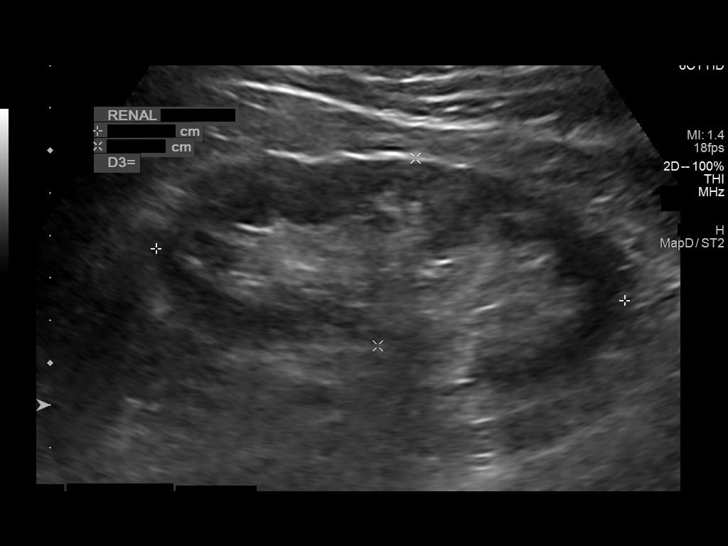
[im 18/34]
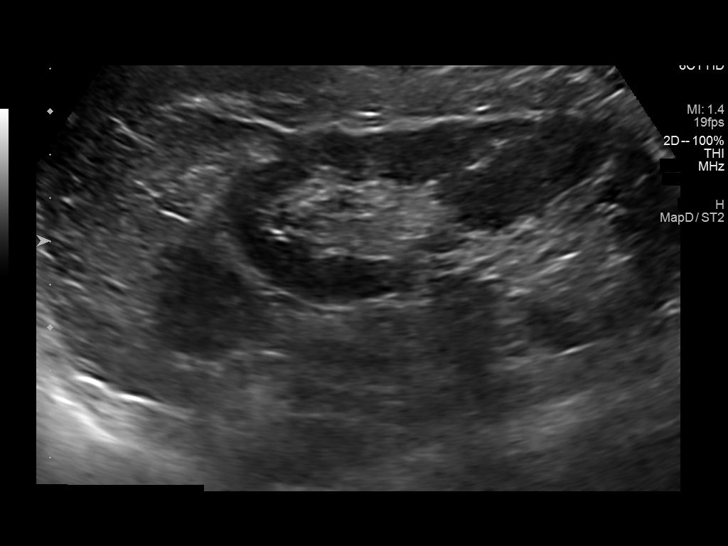
[im 21/34]
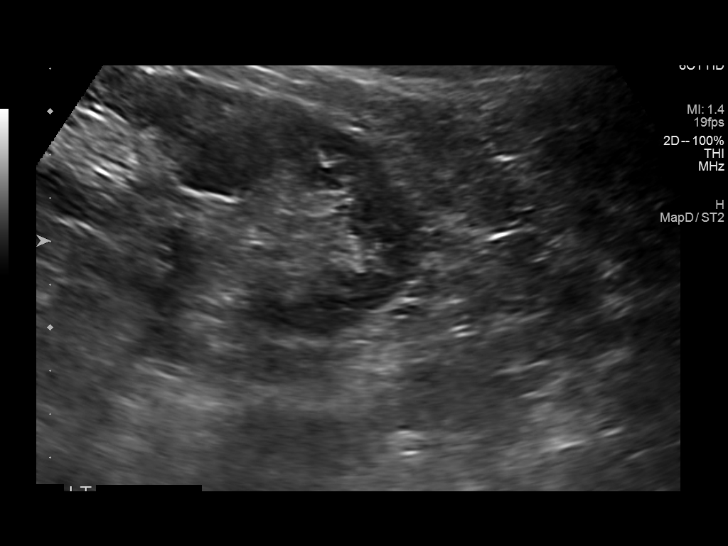
[im 23/34]
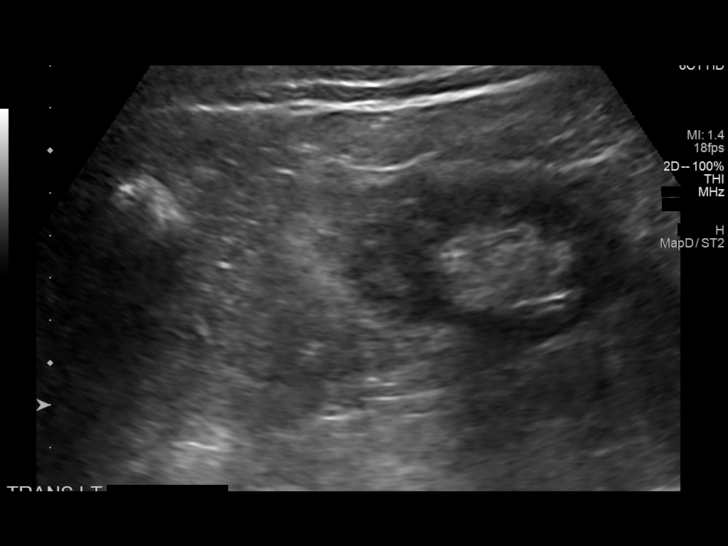
[im 25/34]
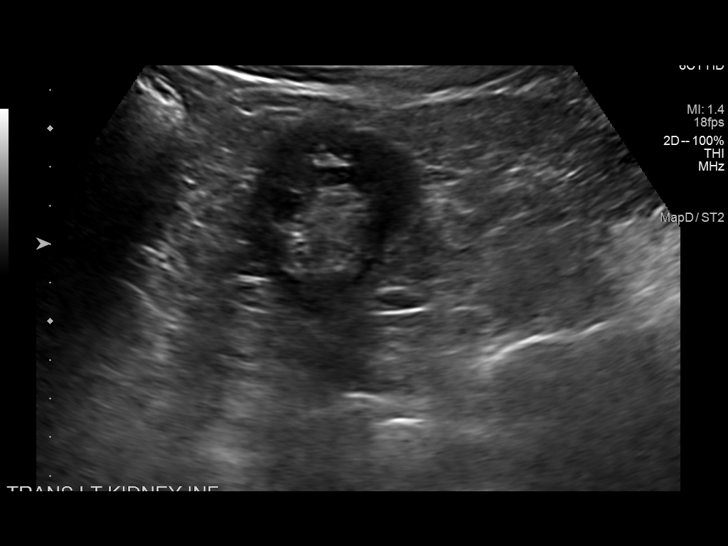
[im 28/34]
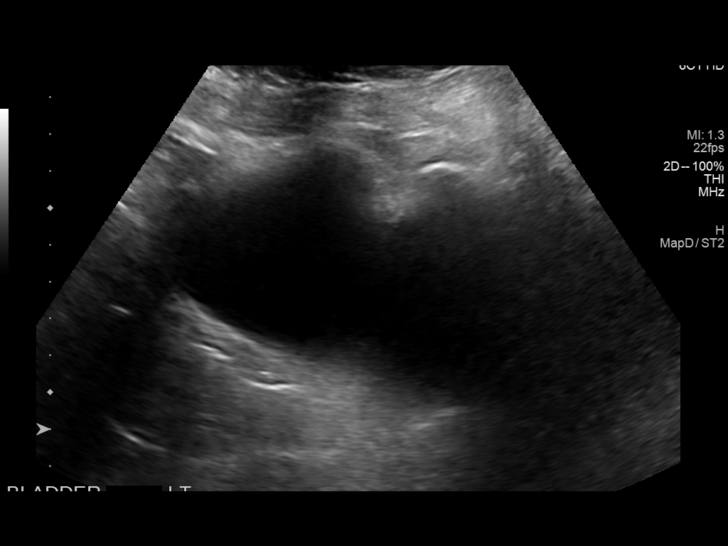
[im 31/34]
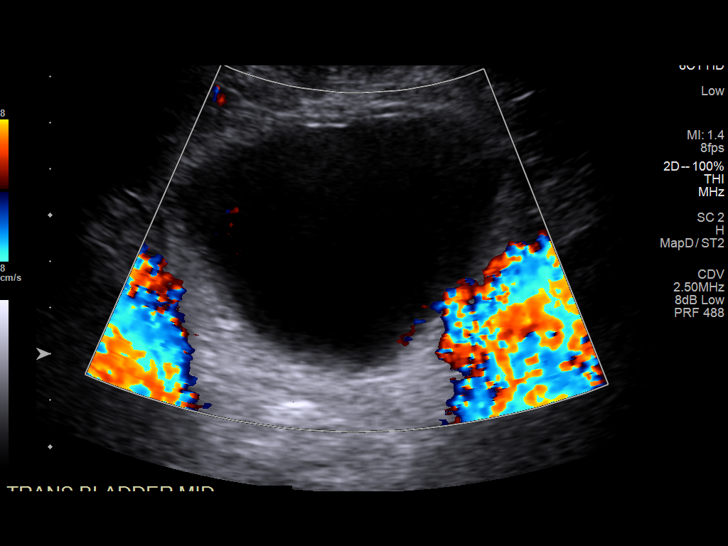
[im 34/34]
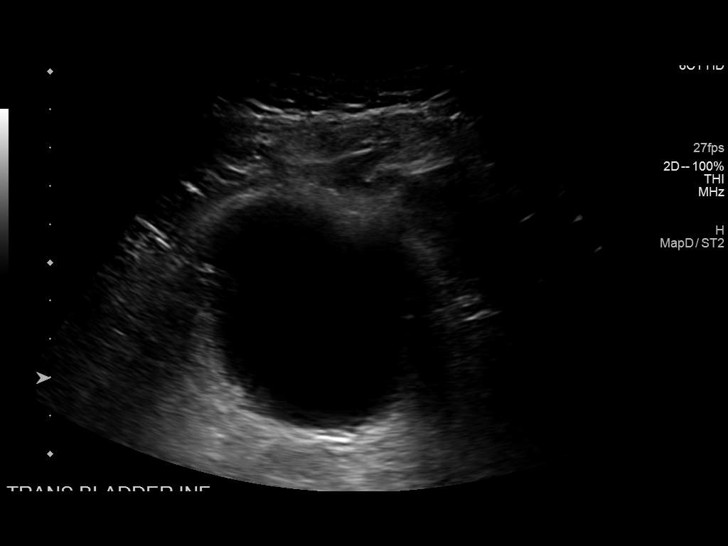

[14 of 25 positions shown; findings below may reference images not displayed]

FINDINGS: Right Kidney:

Renal measurements: 10.3 x 5.0 x 4.4 cm = volume: 119 mL.
Echogenicity within normal limits. No mass or hydronephrosis
visualized.

Left Kidney:

Renal measurements: 11.1 x 4.5 x 5.0 cm = volume: 131 mL.
Echogenicity within normal limits. No mass or hydronephrosis
visualized.

Bladder:

Appears normal for degree of bladder distention.

Other:

None.
IMPRESSION: Normal sonographic appearance of the kidneys bilaterally.

## 2022-03-18 ENCOUNTER — Telehealth: Payer: Self-pay

## 2022-03-18 ENCOUNTER — Ambulatory Visit (INDEPENDENT_AMBULATORY_CARE_PROVIDER_SITE_OTHER): Payer: Medicare Other | Admitting: Internal Medicine

## 2022-03-18 ENCOUNTER — Encounter: Payer: Self-pay | Admitting: Internal Medicine

## 2022-03-18 VITALS — BP 124/80 | HR 78 | Ht 62.8 in | Wt 258.0 lb

## 2022-03-18 DIAGNOSIS — Z794 Long term (current) use of insulin: Secondary | ICD-10-CM

## 2022-03-18 DIAGNOSIS — I130 Hypertensive heart and chronic kidney disease with heart failure and stage 1 through stage 4 chronic kidney disease, or unspecified chronic kidney disease: Secondary | ICD-10-CM

## 2022-03-18 DIAGNOSIS — Z23 Encounter for immunization: Secondary | ICD-10-CM

## 2022-03-18 DIAGNOSIS — E1122 Type 2 diabetes mellitus with diabetic chronic kidney disease: Secondary | ICD-10-CM | POA: Diagnosis not present

## 2022-03-18 DIAGNOSIS — N1832 Chronic kidney disease, stage 3b: Secondary | ICD-10-CM | POA: Diagnosis not present

## 2022-03-18 DIAGNOSIS — Z6841 Body Mass Index (BMI) 40.0 and over, adult: Secondary | ICD-10-CM

## 2022-03-18 MED ORDER — INSULIN LISPRO PROT & LISPRO (75-25 MIX) 100 UNIT/ML KWIKPEN: 55.0000 [IU] | PEN_INJECTOR | Freq: Every evening | SUBCUTANEOUS | 0 refills | Status: DC

## 2022-03-18 NOTE — Progress Notes (Signed)
Rich Brave [place]ittleton,acting as a Education administrator for Maximino Greenland, MD.,have documented all relevant documentation on the behalf of Maximino Greenland, MD,as directed by  Maximino Greenland, MD while in the presence of Maximino Greenland, MD.    Subjective:     Patient ID: Yvonne Miller , female    DOB: 1950-08-08 , 71 y.o.   MRN: [id number]   Chief Complaint  Patient presents with   Diabetes   Hypertension    HPI  She is here today for a diabetes and HTN f/u. She reports compliance with meds. She denies having any headaches, chest pain, and palpitations.   Diabetes She presents for her follow-up diabetic visit. She has type 2 diabetes mellitus. Her disease course has been stable. There are no hypoglycemic associated symptoms. Pertinent negatives for diabetes include no blurred vision, no chest pain, no polydipsia, no polyphagia and no polyuria. There are no hypoglycemic complications. Diabetic complications include nephropathy. Risk factors for coronary artery disease include diabetes mellitus, dyslipidemia, hypertension, obesity and sedentary lifestyle. Current diabetic treatment includes insulin injections. She is compliant with treatment some of the time. She is following a generally healthy diet. She participates in exercise intermittently. Her breakfast blood glucose is taken between 8-9 am. Her breakfast blood glucose range is generally 110-130 mg/dl. An ACE inhibitor/angiotensin II receptor blocker is being taken.  Hypertension This is a chronic problem. The current episode started more than 1 year ago. The problem has been gradually improving since onset. The problem is controlled. Pertinent negatives include no blurred vision, chest pain, palpitations or shortness of breath. The current treatment provides moderate improvement. Compliance problems include exercise.  Hypertensive end-organ damage includes kidney disease and heart failure.     Past Medical History:  Diagnosis Date   AICD  (automatic cardioverter/defibrillator) present    Arthritis    Asthma    Carpal tunnel syndrome, bilateral    CKD (chronic kidney disease)    Diabetes mellitus    Encounter for assessment of implantable cardioverter-defibrillator (ICD)    GERD (gastroesophageal reflux disease)    Hyperlipemia    Hypertension    Obesity    morbid   Sleep apnea    wears CPAP set at 12   Wears glasses      Family History  Problem Relation Age of Onset   Hypertension Mother    Stroke Mother    Heart disease Father    Heart attack Sister    Heart attack Brother      Current Outpatient Medications:    ACCU-CHEK AVIVA PLUS test strip, USE TO CHECK BLOOD SUGAR 4 TIMES A DAY, Disp: 200 strip, Rfl: 11   acetaminophen (TYLENOL) 500 MG tablet, Take 500 mg by mouth every 6 (six) hours as needed for moderate pain., Disp: , Rfl:    albuterol (PROVENTIL HFA;VENTOLIN HFA) 108 (90 Base) MCG/ACT inhaler, Inhale 2 puffs into the lungs every 4 (four) hours as needed for wheezing or shortness of breath. , Disp: , Rfl:    carvedilol (COREG) 25 MG tablet, TAKE 1 AND 1/2 TABLETS BY  MOUTH TWICE DAILY, Disp: 270 tablet, Rfl: 3   Cholecalciferol (VITAMIN D) 50 MCG (2000 UT) tablet, Take 2,000 Units by mouth daily., Disp: , Rfl:    Chromium 1000 MCG TABS, Take 1,000 mcg by mouth daily. , Disp: , Rfl:    Cinnamon 500 MG capsule, Take 500 mg by mouth daily., Disp: , Rfl:    estradiol (ESTRACE) 0.1 MG/GM vaginal cream, Place  vaginally., Disp: , Rfl:    fluticasone (FLONASE) 50 MCG/ACT nasal spray, Place 1 spray into both nostrils daily. (Patient taking differently: Place 1 spray into both nostrils as needed for allergies.), Disp: 16 g, Rfl: 2   furosemide (LASIX) 40 MG tablet, TAKE 1 TABLET BY MOUTH EVERY DAY IN THE MORNING, Disp: 90 tablet, Rfl: 0   hydrALAZINE (APRESOLINE) 50 MG tablet, TAKE 1 TABLET BY MOUTH  TWICE DAILY, Disp: 180 tablet, Rfl: 3   nystatin (NYSTATIN) powder, Apply topically 3 (three) times daily as  needed. As needed, Disp: 60 g, Rfl: 2   Omega-3 Fatty Acids (FISH OIL) 500 MG CAPS, Take 500 mg by mouth daily., Disp: , Rfl:    omeprazole (PRILOSEC) 40 MG capsule, Take 40 mg by mouth daily., Disp: , Rfl:    pravastatin (PRAVACHOL) 40 MG tablet, TAKE 1 TABLET BY MOUTH  DAILY, Disp: 90 tablet, Rfl: 3   Probiotic Product (PROBIOTIC PO), Take by mouth., Disp: , Rfl:    pyridOXINE (VITAMIN B6) 25 MG tablet, Take 25 mg by mouth daily., Disp: , Rfl:    TRULICITY 1.5 NF/6.2ZH SOPN, INJECT 1.5 MG INTO THE SKIN ONCE A WEEK. (Patient taking differently: Inject 2 mg as directed every Wednesday.), Disp: 2 pen, Rfl: 2   valsartan-hydrochlorothiazide (DIOVAN-HCT) 160-25 MG tablet, TAKE 1 TABLET BY MOUTH  DAILY, Disp: 90 tablet, Rfl: 3   vitamin B-12 (CYANOCOBALAMIN) 100 MCG tablet, Take 100 mcg by mouth daily., Disp: , Rfl:    VYZULTA 0.024 % SOLN, Apply 1-2 drops to eye daily., Disp: , Rfl:    Insulin Lispro Prot & Lispro (HUMALOG MIX 75/25 KWIKPEN) (75-25) 100 UNIT/ML Kwikpen, Inject 55 Units into the skin at bedtime., Disp: 9 mL, Rfl: 0   Allergies  Allergen Reactions   Other Other (See Comments)    NO BLOOD   . Jehovah witness     Review of Systems  Constitutional: Negative.   Eyes: Negative.  Negative for blurred vision.  Respiratory: Negative.  Negative for shortness of breath.   Cardiovascular: Negative.  Negative for chest pain and palpitations.  Gastrointestinal: Negative.   Endocrine: Negative for polydipsia, polyphagia and polyuria.  Musculoskeletal: Negative.   Skin: Negative.   Neurological: Negative.   Psychiatric/Behavioral: Negative.       Today's Vitals   03/18/22 0829  BP: 124/80  Pulse: 78  Weight: 258 lb (117 kg)  Height: 5' 2.8" (1.595 m)  PainSc: 0-No pain   Body mass index is 45.99 kg/m.  Wt Readings from Last 3 Encounters:  03/18/22 258 lb (117 kg)  01/03/22 263 lb 12.8 oz (119.7 kg)  10/21/21 261 lb (118.4 kg)     Objective:  Physical Exam Vitals and  nursing note reviewed.  Constitutional:      Appearance: Normal appearance.  HENT:     Head: Normocephalic and atraumatic.     Nose:     Comments: Masked     Mouth/Throat:     Comments: Masked  Eyes:     Extraocular Movements: Extraocular movements intact.  Cardiovascular:     Rate and Rhythm: Normal rate and regular rhythm.     Heart sounds: Normal heart sounds.  Pulmonary:     Effort: Pulmonary effort is normal.     Breath sounds: Normal breath sounds.  Musculoskeletal:     Cervical back: Normal range of motion.  Skin:    General: Skin is warm.  Neurological:     General: No focal deficit present.  Mental Status: She is alert.  Psychiatric:        Mood and Affect: Mood normal.        Behavior: Behavior normal.       Assessment And Plan:     1. Type 2 diabetes mellitus with stage 3b chronic kidney disease, with long-term current use of insulin (HCC) Comments: Chronic, I will check labs as below.  I will decrease Humalog 75/25 to 55 units nightly.  She was congratulated on her lifestyle changes. F/u 3 months. - Hemoglobin A1c - CMP14+EGFR - Protein electrophoresis, serum - Phosphorus - Parathyroid Hormone, Intact w/Ca  2. Hypertensive heart and renal disease with renal failure, stage 1 through stage 4 or unspecified chronic kidney disease, with heart failure ([place]) Comments: Chronic, well controlled. She will c/w valsartan/hctz, hydralazine, and carvedilol. She is encouraged to follow low sodium diet. - CMP14+EGFR  3. Class 3 severe obesity due to excess calories with serious comorbidity and body mass index (BMI) of 45.0 to 49.9 in adult [facility]) Comments: BMI 45. She was congratulated on her 5lb weight loss  and encouraged to keep up the great work!  4. Immunization due - Flu Vaccine QUAD High Dose(Fluad)   Patient was given opportunity to ask questions. Patient verbalized understanding of the plan and was able to repeat key elements of the plan. All questions were  answered to their satisfaction.   I, Maximino Greenland, MD, have reviewed all documentation for this visit. The documentation on 03/18/22 for the exam, diagnosis, procedures, and orders are all accurate and complete.   IF YOU HAVE BEEN REFERRED TO A SPECIALIST, IT MAY TAKE 1-2 WEEKS TO SCHEDULE/PROCESS THE REFERRAL. IF YOU HAVE NOT HEARD FROM US/SPECIALIST IN TWO WEEKS, PLEASE GIVE [place] A CALL AT [phone].   THE PATIENT IS ENCOURAGED TO PRACTICE SOCIAL DISTANCING DUE TO THE COVID-19 PANDEMIC.

## 2022-03-18 NOTE — Telephone Encounter (Signed)
Placed in error

## 2022-03-18 NOTE — Patient Instructions (Signed)

## 2022-03-20 ENCOUNTER — Telehealth: Payer: Self-pay

## 2022-03-20 NOTE — Chronic Care Management (AMB) (Signed)
Faxed 2024 re-enrollment application to [facility] Patient assistance program for Trulicity and Humalog.   [name], [place] Pharmacist Assistant [phone]

## 2022-03-21 LAB — CMP14+EGFR
ALT: 12 IU/L (ref 0–32)
AST: 15 IU/L (ref 0–40)
Albumin/Globulin Ratio: 1.4 (ref 1.2–2.2)
Albumin: 4.1 g/dL (ref 3.8–4.8)
Alkaline Phosphatase: 105 IU/L (ref 44–121)
BUN/Creatinine Ratio: 16 (ref 12–28)
BUN: 20 mg/dL (ref 8–27)
Bilirubin Total: 0.2 mg/dL (ref 0.0–1.2)
CO2: 25 mmol/L (ref 20–29)
Calcium: 9.4 mg/dL (ref 8.7–10.3)
Chloride: 100 mmol/L (ref 96–106)
Creatinine, Ser: 1.25 mg/dL — ABNORMAL HIGH (ref 0.57–1.00)
Globulin, Total: 2.9 g/dL (ref 1.5–4.5)
Glucose: 85 mg/dL (ref 70–99)
Potassium: 4.3 mmol/L (ref 3.5–5.2)
Sodium: 141 mmol/L (ref 134–144)
Total Protein: 7 g/dL (ref 6.0–8.5)
eGFR: 46 mL/min/{1.73_m2} — ABNORMAL LOW (ref >59)

## 2022-03-21 LAB — PROTEIN ELECTROPHORESIS, SERUM
A/G Ratio: 1.1 (ref 0.7–1.7)
Albumin ELP: 3.6 g/dL (ref 2.9–4.4)
Alpha 1: 0.2 g/dL (ref 0.0–0.4)
Alpha 2: 0.7 g/dL (ref 0.4–1.0)
Beta: 1.1 g/dL (ref 0.7–1.3)
Gamma Globulin: 1.3 g/dL (ref 0.4–1.8)
Globulin, Total: 3.4 g/dL (ref 2.2–3.9)

## 2022-03-21 LAB — HEMOGLOBIN A1C
Est. average glucose Bld gHb Est-mCnc: 146 mg/dL
Hgb A1c MFr Bld: 6.7 % — ABNORMAL HIGH (ref 4.8–5.6)

## 2022-03-21 LAB — PTH, INTACT AND CALCIUM: PTH: 30 pg/mL (ref 15–65)

## 2022-03-21 LAB — PHOSPHORUS: Phosphorus: 4 mg/dL (ref 3.0–4.3)

## 2022-04-01 ENCOUNTER — Telehealth: Payer: Self-pay

## 2022-04-01 NOTE — Chronic Care Management (AMB) (Signed)
Notification received from [facility] patient assistance program. Patient has been approved for 2024 enrollment year for Trulicity and Humalog.   [name], [place] Pharmacist Assistant [phone]

## 2022-04-16 DIAGNOSIS — Z4502 Encounter for adjustment and management of automatic implantable cardiac defibrillator: Secondary | ICD-10-CM | POA: Diagnosis not present

## 2022-04-16 DIAGNOSIS — I5022 Chronic systolic (congestive) heart failure: Secondary | ICD-10-CM | POA: Diagnosis not present

## 2022-04-16 DIAGNOSIS — Z95810 Presence of automatic (implantable) cardiac defibrillator: Secondary | ICD-10-CM | POA: Diagnosis not present

## 2022-04-19 ENCOUNTER — Other Ambulatory Visit: Payer: Self-pay | Admitting: Cardiology

## 2022-04-19 DIAGNOSIS — I5042 Chronic combined systolic (congestive) and diastolic (congestive) heart failure: Secondary | ICD-10-CM

## 2022-04-21 ENCOUNTER — Encounter: Payer: Self-pay | Admitting: Internal Medicine

## 2022-05-07 ENCOUNTER — Other Ambulatory Visit: Payer: Self-pay | Admitting: Cardiology

## 2022-06-12 ENCOUNTER — Other Ambulatory Visit: Payer: Self-pay | Admitting: Cardiology

## 2022-06-19 ENCOUNTER — Ambulatory Visit (INDEPENDENT_AMBULATORY_CARE_PROVIDER_SITE_OTHER): Payer: Medicare Other | Admitting: Internal Medicine

## 2022-06-19 ENCOUNTER — Encounter: Payer: Self-pay | Admitting: Internal Medicine

## 2022-06-19 VITALS — BP 135/68 | HR 71 | Temp 98.1°F | Ht 62.0 in | Wt 250.0 lb

## 2022-06-19 DIAGNOSIS — Z794 Long term (current) use of insulin: Secondary | ICD-10-CM

## 2022-06-19 DIAGNOSIS — N1832 Chronic kidney disease, stage 3b: Secondary | ICD-10-CM

## 2022-06-19 DIAGNOSIS — M65342 Trigger finger, left ring finger: Secondary | ICD-10-CM | POA: Diagnosis not present

## 2022-06-19 DIAGNOSIS — I130 Hypertensive heart and chronic kidney disease with heart failure and stage 1 through stage 4 chronic kidney disease, or unspecified chronic kidney disease: Secondary | ICD-10-CM

## 2022-06-19 DIAGNOSIS — Z6841 Body Mass Index (BMI) 40.0 and over, adult: Secondary | ICD-10-CM

## 2022-06-19 DIAGNOSIS — E278 Other specified disorders of adrenal gland: Secondary | ICD-10-CM

## 2022-06-19 DIAGNOSIS — E1122 Type 2 diabetes mellitus with diabetic chronic kidney disease: Secondary | ICD-10-CM | POA: Diagnosis not present

## 2022-06-19 NOTE — Progress Notes (Signed)
Barnet [place] Martin,acting as a Education administrator for Maximino Greenland, MD.,have documented all relevant documentation on the behalf of Maximino Greenland, MD,as directed by  Maximino Greenland, MD while in the presence of Maximino Greenland, MD.    Subjective:     Patient ID: Yvonne Miller , female    DOB: 08-23-1950 , 72 y.o.   MRN: [id number]   Chief Complaint  Patient presents with   Hypertension   Diabetes    HPI  She is here today for a diabetes and HTN f/u. She reports compliance with meds. She denies having any headaches, chest pain, and palpitations.  BP Readings from Last 3 Encounters: 06/19/22 : 135/68 03/18/22 : 124/80 01/03/22 : 100/64    Hypertension This is a chronic problem. The current episode started more than 1 year ago. The problem has been gradually improving since onset. The problem is controlled. Pertinent negatives include no blurred vision, chest pain, palpitations or shortness of breath. The current treatment provides moderate improvement. Compliance problems include exercise.  Hypertensive end-organ damage includes kidney disease and heart failure.  Diabetes She presents for her follow-up diabetic visit. She has type 2 diabetes mellitus. Her disease course has been stable. There are no hypoglycemic associated symptoms. Pertinent negatives for diabetes include no blurred vision, no chest pain, no polydipsia, no polyphagia and no polyuria. There are no hypoglycemic complications. Diabetic complications include nephropathy. Risk factors for coronary artery disease include diabetes mellitus, dyslipidemia, hypertension, obesity and sedentary lifestyle. Current diabetic treatment includes insulin injections. She is compliant with treatment some of the time. She is following a generally healthy diet. She participates in exercise intermittently. Her breakfast blood glucose is taken between 8-9 am. Her breakfast blood glucose range is generally 110-130 mg/dl. An ACE inhibitor/angiotensin II  receptor blocker is being taken.     Past Medical History:  Diagnosis Date   AICD (automatic cardioverter/defibrillator) present    Arthritis    Asthma    Carpal tunnel syndrome, bilateral    CKD (chronic kidney disease)    Diabetes mellitus    Encounter for assessment of implantable cardioverter-defibrillator (ICD)    GERD (gastroesophageal reflux disease)    Hyperlipemia    Hypertension    Obesity    morbid   Sleep apnea    wears CPAP set at 12   Wears glasses      Family History  Problem Relation Age of Onset   Hypertension Mother    Stroke Mother    Heart disease Father    Heart attack Sister    Heart attack Brother      Current Outpatient Medications:    ACCU-CHEK AVIVA PLUS test strip, USE TO CHECK BLOOD SUGAR 4 TIMES A DAY, Disp: 200 strip, Rfl: 11   acetaminophen (TYLENOL) 500 MG tablet, Take 500 mg by mouth every 6 (six) hours as needed for moderate pain., Disp: , Rfl:    albuterol (PROVENTIL HFA;VENTOLIN HFA) 108 (90 Base) MCG/ACT inhaler, Inhale 2 puffs into the lungs every 4 (four) hours as needed for wheezing or shortness of breath. , Disp: , Rfl:    carvedilol (COREG) 25 MG tablet, TAKE 1 AND 1/2 TABLETS BY MOUTH  TWICE DAILY, Disp: 300 tablet, Rfl: 2   Cholecalciferol (VITAMIN D) 50 MCG (2000 UT) tablet, Take 2,000 Units by mouth daily., Disp: , Rfl:    Chromium 1000 MCG TABS, Take 1,000 mcg by mouth daily. , Disp: , Rfl:    Cinnamon 500 MG capsule, Take 500  mg by mouth daily., Disp: , Rfl:    estradiol (ESTRACE) 0.1 MG/GM vaginal cream, Place vaginally., Disp: , Rfl:    fluticasone (FLONASE) 50 MCG/ACT nasal spray, Place 1 spray into both nostrils daily. (Patient taking differently: Place 1 spray into both nostrils as needed for allergies.), Disp: 16 g, Rfl: 2   furosemide (LASIX) 40 MG tablet, TAKE 1 TABLET BY MOUTH EVERY DAY IN THE MORNING, Disp: 90 tablet, Rfl: 0   hydrALAZINE (APRESOLINE) 50 MG tablet, TAKE 1 TABLET BY MOUTH TWICE  DAILY, Disp: 200  tablet, Rfl: 2   Insulin Lispro Prot & Lispro (HUMALOG MIX 75/25 KWIKPEN) (75-25) 100 UNIT/ML Kwikpen, Inject 55 Units into the skin at bedtime., Disp: 9 mL, Rfl: 0   nystatin (NYSTATIN) powder, Apply topically 3 (three) times daily as needed. As needed, Disp: 60 g, Rfl: 2   Omega-3 Fatty Acids (FISH OIL) 500 MG CAPS, Take 500 mg by mouth daily., Disp: , Rfl:    omeprazole (PRILOSEC) 40 MG capsule, Take 40 mg by mouth daily., Disp: , Rfl:    pravastatin (PRAVACHOL) 40 MG tablet, TAKE 1 TABLET BY MOUTH  DAILY, Disp: 90 tablet, Rfl: 3   Probiotic Product (PROBIOTIC PO), Take by mouth., Disp: , Rfl:    pyridOXINE (VITAMIN B6) 25 MG tablet, Take 25 mg by mouth daily., Disp: , Rfl:    TRULICITY 1.5 0000000 SOPN, INJECT 1.5 MG INTO THE SKIN ONCE A WEEK. (Patient taking differently: Inject 2 mg as directed every Wednesday.), Disp: 2 pen, Rfl: 2   valsartan-hydrochlorothiazide (DIOVAN-HCT) 160-25 MG tablet, TAKE 1 TABLET BY MOUTH  DAILY, Disp: 90 tablet, Rfl: 3   vitamin B-12 (CYANOCOBALAMIN) 100 MCG tablet, Take 100 mcg by mouth daily., Disp: , Rfl:    VYZULTA 0.024 % SOLN, Apply 1-2 drops to eye daily., Disp: , Rfl:    Allergies  Allergen Reactions   Other Other (See Comments)    NO BLOOD   . Jehovah witness     Review of Systems  Constitutional: Negative.   Eyes:  Negative for blurred vision.  Respiratory: Negative.  Negative for shortness of breath.   Cardiovascular: Negative.  Negative for chest pain and palpitations.  Endocrine: Negative for polydipsia, polyphagia and polyuria.  Musculoskeletal:  Positive for arthralgias.       She c/o left ring finger pain. Sx started 2 months ago. Not sure what has triggered her sx. States sx have worsened. Finger often gets "stuck". She states this is quite painful.   Neurological: Negative.   Psychiatric/Behavioral: Negative.       Today's Vitals   06/19/22 0826  BP: 135/68  Pulse: 71  Temp: 98.1 F (36.7 C)  TempSrc: Oral  Weight: 250 lb  (113.4 kg)  Height: 5' 2"$  (1.575 m)  PainSc: 0-No pain   Body mass index is 45.73 kg/m.  Wt Readings from Last 3 Encounters:  06/19/22 250 lb (113.4 kg)  03/18/22 258 lb (117 kg)  01/03/22 263 lb 12.8 oz (119.7 kg)    Objective:  Physical Exam Vitals and nursing note reviewed.  Constitutional:      Appearance: Normal appearance. She is obese.  HENT:     Head: Normocephalic and atraumatic.     Nose:     Comments: Masked     Mouth/Throat:     Comments: Masked  Eyes:     Extraocular Movements: Extraocular movements intact.  Cardiovascular:     Rate and Rhythm: Normal rate and regular rhythm.  Heart sounds: Normal heart sounds.  Pulmonary:     Effort: Pulmonary effort is normal.  Musculoskeletal:     Cervical back: Normal range of motion.  Skin:    General: Skin is warm.  Neurological:     General: No focal deficit present.     Mental Status: She is alert.  Psychiatric:        Mood and Affect: Mood normal.        Behavior: Behavior normal.     Assessment And Plan:     1. Hypertensive heart and renal disease with renal failure, stage 1 through stage 4 or unspecified chronic kidney disease, with heart failure (HCC) Comments: Chronic, fair control. She will c/w valsartan/hctz 160/71m, hydralazine 554mtwice daily, furosemide 2061mnd carvedilol 4m35md. - BMP8+eGFR - Amb Referral To Provider Referral Exercise Program (P.R.E.P)  2. Type 2 diabetes mellitus with stage 3b chronic kidney disease, with long-term current use of insulin (HCC) Comments: Chronic, reminded to avoid NSAIDS.  She will c/w Trulicity and Humalog 75/299991111couraged to decrease intake of sugary beverages/foods. - BMP8+eGFR - Hemoglobin A1c - Parathyroid Hormone, Intact w/Ca - Protein electrophoresis, serum - Phosphorus - Amb Referral To Provider Referral Exercise Program (P.R.E.P)  3. Trigger ring finger of left hand Comments: I will refer her to Hand Surgery for further evaluation/treatment -  injection vs. surgical repair. - Ambulatory referral to Hand Surgery  4. Class 3 severe obesity due to excess calories with serious comorbidity and body mass index (BMI) of 45.0 to 49.9 in adult ([facility]: BMI 45. She is encouraged to aim for at least 150 minutes of exercise/week. - Amb Referral To Provider Referral Exercise Program (P.R.E.P)   Patient was given opportunity to ask questions. Patient verbalized understanding of the plan and was able to repeat key elements of the plan. All questions were answered to their satisfaction.   I, RobyMaximino Greenland, have reviewed all documentation for this visit. The documentation on 06/26/22 for the exam, diagnosis, procedures, and orders are all accurate and complete.   IF YOU HAVE BEEN REFERRED TO A SPECIALIST, IT MAY TAKE 1-2 WEEKS TO SCHEDULE/PROCESS THE REFERRAL. IF YOU HAVE NOT HEARD FROM US/SPECIALIST IN TWO WEEKS, PLEASE GIVE [place] AT [phone].   THE PATIENT IS ENCOURAGED TO PRACTICE SOCIAL DISTANCING DUE TO THE COVID-19 PANDEMIC.

## 2022-06-19 NOTE — Patient Instructions (Addendum)
Trigger Finger  Trigger finger, also called stenosing tenosynovitis,  is a condition that causes a finger to get stuck in a bent position. Each finger has a tendon, which is a tough, cord-like tissue that connects muscle to bone, and each tendon passes through a tunnel of tissue called a tendon sheath. To move your finger, your tendon needs to glide freely through the sheath. Trigger finger happens when the tendon or the sheath thickens, making it difficult to move your finger. Trigger finger can affect any finger or a thumb. It may affect more than one finger. Mild cases may clear up with rest and medicine. Severe cases require more treatment. What are the causes? Trigger finger is caused by a thickened finger tendon or tendon sheath. The cause of this thickening is not known. What increases the risk? The following factors may make you more likely to develop this condition: Doing activities that require a strong grip. Having rheumatoid arthritis, gout, or diabetes. Being 17-51 years old. Being female. What are the signs or symptoms? Symptoms of this condition include: Pain when bending or straightening your finger. Tenderness or swelling where your finger attaches to the palm of your hand. A lump in the palm of your hand or on the inside of your finger. Hearing a noise like a pop or a snap when you try to straighten your finger. Feeling a catching or locking sensation when you try to straighten your finger. Being unable to straighten your finger. How is this diagnosed? This condition is diagnosed based on your symptoms and a physical exam. How is this treated? This condition may be treated by: Resting your finger and avoiding activities that make symptoms worse. Wearing a finger splint to keep your finger extended. Taking NSAIDs, such as ibuprofen, to relieve pain and swelling. Doing gentle exercises to stretch the finger as told by your health care provider. Having medicine that reduces  swelling and inflammation (steroids) injected into the tendon sheath. Injections may need to be repeated. Having surgery to open the tendon sheath. This may be done if other treatments do not work and you cannot straighten your finger. You may need physical therapy after surgery. Follow these instructions at home: If you have a splint: Wear the splint as told by your health care provider. Remove it only as told by your health care provider. Loosen it if your fingers tingle, become numb, or turn cold and blue. Keep it clean. If the splint is not waterproof: Do not let it get wet. Cover it with a watertight covering when you take a bath or shower. Managing pain, stiffness, and swelling     If directed, apply heat to the affected area as often as told by your health care provider. Use the heat source that your health care provider recommends, such as a moist heat pack or a heating pad. Place a towel between your skin and the heat source. Leave the heat on for 20-30 minutes. Remove the heat if your skin turns bright red. This is especially important if you are unable to feel pain, heat, or cold. You may have a greater risk of getting burned. If directed, put ice on the painful area. To do this: If you have a removable splint, remove it as told by your health care provider. Put ice in a plastic bag. Place a towel between your skin and the bag or between your splint and the bag. Leave the ice on for 20 minutes, 2-3 times a day.  Activity Rest  your finger as told by your health care provider. Avoid activities that make the pain worse. Return to your normal activities as told by your health care provider. Ask your health care provider what activities are safe for you. Do exercises as told by your health care provider. Ask your health care provider when it is safe to drive if you have a splint on your hand. General instructions Take over-the-counter and prescription medicines only as told by  your health care provider. Keep all follow-up visits as told by your health care provider. This is important. Contact a health care provider if: Your symptoms are not improving with home care. Summary Trigger finger, also called stenosing tenosynovitis, causes your finger to get stuck in a bent position. This can make it difficult and painful to straighten your finger. This condition develops when a finger tendon or tendon sheath thickens. Treatment may include resting your finger, wearing a splint, and taking medicines. In severe cases, surgery to open the tendon sheath may be needed. This information is not intended to replace advice given to you by your health care provider. Make sure you discuss any questions you have with your health care provider. Document Revised: 09/06/2018 Document Reviewed: 09/06/2018 Elsevier Patient Education  [place].

## 2022-06-20 ENCOUNTER — Telehealth: Payer: Self-pay

## 2022-06-20 NOTE — Telephone Encounter (Signed)
Called to discuss PREP program, explained the program and location at [facility]; offered classes in April; she will call me back to let me know if she wants to attend and which class date/time she prefers in April

## 2022-06-23 LAB — PROTEIN ELECTROPHORESIS, SERUM
A/G Ratio: 1.1 (ref 0.7–1.7)
Albumin ELP: 3.8 g/dL (ref 2.9–4.4)
Alpha 1: 0.2 g/dL (ref 0.0–0.4)
Alpha 2: 0.7 g/dL (ref 0.4–1.0)
Beta: 1.2 g/dL (ref 0.7–1.3)
Gamma Globulin: 1.6 g/dL (ref 0.4–1.8)
Globulin, Total: 3.6 g/dL (ref 2.2–3.9)
Total Protein: 7.4 g/dL (ref 6.0–8.5)

## 2022-06-23 LAB — PHOSPHORUS: Phosphorus: 4 mg/dL (ref 3.0–4.3)

## 2022-06-23 LAB — BMP8+EGFR
BUN/Creatinine Ratio: 24 (ref 12–28)
BUN: 36 mg/dL — ABNORMAL HIGH (ref 8–27)
CO2: 24 mmol/L (ref 20–29)
Calcium: 9.7 mg/dL (ref 8.7–10.3)
Chloride: 98 mmol/L (ref 96–106)
Creatinine, Ser: 1.53 mg/dL — ABNORMAL HIGH (ref 0.57–1.00)
Glucose: 132 mg/dL — ABNORMAL HIGH (ref 70–99)
Potassium: 4.5 mmol/L (ref 3.5–5.2)
Sodium: 139 mmol/L (ref 134–144)
eGFR: 36 mL/min/{1.73_m2} — ABNORMAL LOW (ref >59)

## 2022-06-23 LAB — HEMOGLOBIN A1C
Est. average glucose Bld gHb Est-mCnc: 151 mg/dL
Hgb A1c MFr Bld: 6.9 % — ABNORMAL HIGH (ref 4.8–5.6)

## 2022-06-23 LAB — PTH, INTACT AND CALCIUM: PTH: 19 pg/mL (ref 15–65)

## 2022-06-24 ENCOUNTER — Encounter: Payer: Self-pay | Admitting: Internal Medicine

## 2022-07-01 DIAGNOSIS — H401131 Primary open-angle glaucoma, bilateral, mild stage: Secondary | ICD-10-CM | POA: Diagnosis not present

## 2022-07-02 DIAGNOSIS — M65342 Trigger finger, left ring finger: Secondary | ICD-10-CM | POA: Diagnosis not present

## 2022-07-04 ENCOUNTER — Encounter: Payer: Self-pay | Admitting: Internal Medicine

## 2022-07-11 ENCOUNTER — Ambulatory Visit: Payer: Medicare Other | Admitting: Cardiology

## 2022-07-16 DIAGNOSIS — Z4502 Encounter for adjustment and management of automatic implantable cardiac defibrillator: Secondary | ICD-10-CM | POA: Diagnosis not present

## 2022-07-16 DIAGNOSIS — I5022 Chronic systolic (congestive) heart failure: Secondary | ICD-10-CM | POA: Diagnosis not present

## 2022-07-16 DIAGNOSIS — Z95810 Presence of automatic (implantable) cardiac defibrillator: Secondary | ICD-10-CM | POA: Diagnosis not present

## 2022-07-16 NOTE — Progress Notes (Signed)
Primary Physician/Referring:  [name], MD  Patient ID: Yvonne Miller, female    DOB: 1950/05/21, 72 y.o.   MRN: [id number]  Chief Complaint  Patient presents with   Non-ischemic cardiomyopathy [facility])   Follow-up    HPI:    Yvonne Miller  is a 72 y.o. 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  She also has morbid obesity, OSA not on CPAP recently, hypertension, DM with stage 3b CKD, hyperlipidemia, degenerative joint disease and recent diagnosis of diabetic gastroparesis.  She presents for a 65-month office visit, presently doing well and essentially remains asymptomatic without dyspnea, PND or orthopnea or leg edema.     Past Medical History:  Diagnosis Date   AICD (automatic cardioverter/defibrillator) present    Arthritis    Asthma    Carpal tunnel syndrome, bilateral    CKD (chronic kidney disease)    Diabetes mellitus    Encounter for assessment of implantable cardioverter-defibrillator (ICD)    GERD (gastroesophageal reflux disease)    Hyperlipemia    Hypertension    Obesity    morbid   Sleep apnea    wears CPAP set at 12   Wears glasses    Past Surgical History:  Procedure Laterality Date   ABDOMINAL HYSTERECTOMY     BIOPSY  03/01/2019   Procedure: BIOPSY;  Surgeon: Charna Elizabeth, MD;  Location: [facility];  Service: Endoscopy;;   BREAST SURGERY  1968   breast mass excision   CARPAL TUNNEL RELEASE Left 07/10/2017   Procedure: CARPAL TUNNEL RELEASE;  Surgeon: Coletta Memos, MD;  Location: [facility] OR;  Service: Neurosurgery;  Laterality: Left;  left   CARPAL TUNNEL RELEASE Right 03/19/2018   Procedure: RIGHT CARPAL TUNNEL RELEASE;  Surgeon: Coletta Memos, MD;  Location: [facility] OR;  Service: Neurosurgery;  Laterality: Right;  right   CERVICAL SPINE SURGERY  2006   CHOLECYSTECTOMY     COLONOSCOPY WITH PROPOFOL N/A  03/01/2019   Procedure: COLONOSCOPY WITH PROPOFOL;  Surgeon: Charna Elizabeth, MD;  Location: [facility];  Service: Endoscopy;  Laterality: N/A;   DILATION AND CURETTAGE OF UTERUS     ICD IMPLANT     JOINT REPLACEMENT     TONSILLECTOMY  1961   TOTAL KNEE ARTHROPLASTY Left 01/26/2018   Procedure: LEFT TOTAL KNEE ARTHROPLASTY;  Surgeon: Kathryne Hitch, MD;  Location: [facility] OR;  Service: Orthopedics;  Laterality: Left;   Family History  Problem Relation Age of Onset   Hypertension Mother    Stroke Mother    Heart disease Father    Heart attack Sister    Heart attack Brother     Social History   Tobacco Use   Smoking status: Never   Smokeless tobacco: Never  Substance Use Topics   Alcohol use: No   Marital Status: Married  ROS  Review of Systems  Cardiovascular:  Negative for chest pain, dyspnea on exertion and leg swelling.  Respiratory:  Positive for snoring (not on cpap).    Objective  Blood pressure 125/60, pulse 71, height 5\' 2"  (1.575 m), weight 259 lb (117.5 kg), SpO2 96 %.     07/17/2022    8:56 AM 06/19/2022    8:26 AM 03/18/2022    8:29 AM  Vitals with BMI  Height 5\' 2"  5\' 2"  5' 2.8"  Weight 259 lbs 250 lbs 258 lbs  BMI 47.36 45.71 46  Systolic  125 135 124  Diastolic 60 68 80  Pulse 71 71 78     Physical Exam Constitutional:      Appearance: She is obese.  Neck:     Vascular: No carotid bruit or JVD.  Cardiovascular:     Rate and Rhythm: Normal rate and regular rhythm.     Pulses: Intact distal pulses.     Heart sounds: Normal heart sounds. No murmur heard.    No gallop.  Pulmonary:     Effort: Pulmonary effort is normal.     Breath sounds: Normal breath sounds.  Abdominal:     General: Bowel sounds are normal.     Palpations: Abdomen is soft.  Musculoskeletal:     Right lower leg: No edema.     Left lower leg: No edema.   Laboratory examination:   Lab Results  Component Value Date   NA 139 06/19/2022   K 4.5 06/19/2022   CO2 24  06/19/2022   GLUCOSE 132 (H) 06/19/2022   BUN 36 (H) 06/19/2022   CREATININE 1.53 (H) 06/19/2022   CALCIUM 9.7 06/19/2022   EGFR 36 (L) 06/19/2022   GFRNONAA 34 (L) 11/14/2020       Latest Ref Rng & Units 06/19/2022    9:22 AM 03/18/2022    9:16 AM 10/21/2021    9:59 AM  CMP  Glucose 70 - 99 mg/dL 956  85  84   BUN 8 - 27 mg/dL 36  20  27   Creatinine 0.57 - 1.00 mg/dL 2.13  0.86  5.78   Sodium 134 - 144 mmol/L 139  141  139   Potassium 3.5 - 5.2 mmol/L 4.5  4.3  4.2   Chloride 96 - 106 mmol/L 98  100  100   CO2 20 - 29 mmol/L 24  25  25    Calcium 8.7 - 10.3 mg/dL 9.7  9.4  9.3   Total Protein 6.0 - 8.5 g/dL 7.4  7.0  7.5   Total Bilirubin 0.0 - 1.2 mg/dL  0.2  0.2   Alkaline Phos 44 - 121 IU/L  105  109   AST 0 - 40 IU/L  15  14   ALT 0 - 32 IU/L  12  12       Latest Ref Rng & Units 10/21/2021    9:59 AM 11/14/2020   10:04 AM 06/21/2020   11:37 AM  CBC  WBC 3.4 - 10.8 x10E3/uL 8.7  10.1  10.5   Hemoglobin 11.1 - 15.9 g/dL 46.9  62.9  52.8   Hematocrit 34.0 - 46.6 % 37.9  37.7  36.8   Platelets 150 - 450 x10E3/uL 369  300  333    Lipid Panel     Component Value Date/Time   CHOL 135 10/21/2021 0959   TRIG 137 10/21/2021 0959   HDL 45 10/21/2021 0959   CHOLHDL 3.0 10/21/2021 0959   CHOLHDL 3.8 04/22/2009 0603   VLDL 45 (H) 04/22/2009 0603   LDLCALC 66 10/21/2021 0959   HEMOGLOBIN A1C Lab Results  Component Value Date   HGBA1C 6.9 (H) 06/19/2022   MPG 165.68 01/15/2018   TSH Recent Labs    10/21/21 0959  TSH 1.290   BNP (last 3 results) No results for input(s): "BNP" in the last 8760 hours.  Medications and allergies   Allergies  Allergen Reactions   Other Other (See Comments)    NO BLOOD   . Jehovah witness      Current  Outpatient Medications:    ACCU-CHEK AVIVA PLUS test strip, USE TO CHECK BLOOD SUGAR 4 TIMES A DAY, Disp: 200 strip, Rfl: 11   acetaminophen (TYLENOL) 500 MG tablet, Take 500 mg by mouth every 6 (six) hours as needed for moderate  pain., Disp: , Rfl:    albuterol (PROVENTIL HFA;VENTOLIN HFA) 108 (90 Base) MCG/ACT inhaler, Inhale 2 puffs into the lungs every 4 (four) hours as needed for wheezing or shortness of breath. , Disp: , Rfl:    carvedilol (COREG) 25 MG tablet, TAKE 1 AND 1/2 TABLETS BY MOUTH  TWICE DAILY, Disp: 300 tablet, Rfl: 2   Cholecalciferol (VITAMIN D) 50 MCG (2000 UT) tablet, Take 2,000 Units by mouth daily., Disp: , Rfl:    Chromium 1000 MCG TABS, Take 1,000 mcg by mouth daily. , Disp: , Rfl:    Cinnamon 500 MG capsule, Take 500 mg by mouth daily., Disp: , Rfl:    estradiol (ESTRACE) 0.1 MG/GM vaginal cream, Place vaginally., Disp: , Rfl:    fluticasone (FLONASE) 50 MCG/ACT nasal spray, Place 1 spray into both nostrils daily. (Patient taking differently: Place 1 spray into both nostrils as needed for allergies.), Disp: 16 g, Rfl: 2   furosemide (LASIX) 40 MG tablet, TAKE 1 TABLET BY MOUTH EVERY DAY IN THE MORNING, Disp: 90 tablet, Rfl: 0   hydrALAZINE (APRESOLINE) 50 MG tablet, TAKE 1 TABLET BY MOUTH TWICE  DAILY, Disp: 200 tablet, Rfl: 2   Insulin Lispro Prot & Lispro (HUMALOG MIX 75/25 KWIKPEN) (75-25) 100 UNIT/ML Kwikpen, Inject 55 Units into the skin at bedtime., Disp: 9 mL, Rfl: 0   nystatin (NYSTATIN) powder, Apply topically 3 (three) times daily as needed. As needed, Disp: 60 g, Rfl: 2   Omega-3 Fatty Acids (FISH OIL) 500 MG CAPS, Take 500 mg by mouth daily., Disp: , Rfl:    pravastatin (PRAVACHOL) 40 MG tablet, TAKE 1 TABLET BY MOUTH  DAILY, Disp: 90 tablet, Rfl: 3   Probiotic Product (PROBIOTIC PO), Take by mouth., Disp: , Rfl:    pyridOXINE (VITAMIN B6) 25 MG tablet, Take 25 mg by mouth daily., Disp: , Rfl:    TRULICITY 1.5 MG/0.5ML SOPN, INJECT 1.5 MG INTO THE SKIN ONCE A WEEK. (Patient taking differently: Inject 2 mg as directed every Wednesday.), Disp: 2 pen, Rfl: 2   valsartan-hydrochlorothiazide (DIOVAN-HCT) 160-25 MG tablet, TAKE 1 TABLET BY MOUTH  DAILY, Disp: 90 tablet, Rfl: 3   vitamin  B-12 (CYANOCOBALAMIN) 100 MCG tablet, Take 100 mcg by mouth daily., Disp: , Rfl:    VYZULTA 0.024 % SOLN, Apply 1-2 drops to eye daily., Disp: , Rfl:    omeprazole (PRILOSEC) 40 MG capsule, Take 1 capsule (40 mg total) by mouth daily before breakfast., Disp: 90 capsule, Rfl: 0   Radiology:   No results found.  Cardiac Studies:   Sleep study 2010 (sleep apnea-has a CPAP but doesn't use it every night).  Coronary Angiography  R+ L 03/04/11: and in 2010 Normal coronary arteries. Moderate pulmonary hypertension.  PCV ECHOCARDIOGRAM COMPLETE 10/30/2020 Left ventricle cavity is normal in size and wall thickness. Normal global wall motion. Normal LV systolic function with visual EF 50-55%. Doppler evidence of grade I (impaired) diastolic dysfunction, normal LAP. Left atrial cavity is mildly dilated. Trileaflet aortic valve.  Trace aortic stenosis. Trace aortic regurgitation. Mild (Grade I) mitral regurgitation. Mild to moderate tricuspid regurgitation. Estimated pulmonary artery systolic pressure 31 mmHg. No significant change compared to previous study in 2016.   ICD - Biventricular [place]  [employer] Dynagen X4 CRT-D Model G158 ICD in place 10/28/13    Remote CRT-D transmission 07/15/2021: Longevity 1 year and 6 months.  AP 2%, CRT 100%.  Lead impedance and thresholds within normal limits.   There were no high ventricular rate episodes.  Normal function.   EKG EKG 07/17/2022: Atrially sensed, ventricularly paced rhythm at the rate of 66 bpm.  Biventricular pacemaker detected.  No further analysis.  Compared to 01/03/2022, no significant change.  Assessment     ICD-10-CM   1. Non-ischemic cardiomyopathy (HCC)  I42.8 EKG 12-Lead    PCV ECHOCARDIOGRAM COMPLETE    2. LBBB (left bundle branch block)  I44.7     3. Type 2 diabetes mellitus with stage 3b chronic kidney disease, with long-term current use of insulin (HCC)  E11.22    N18.32    Z79.4     4. ICD -  Biventricular  [place]  Scientific Dynagen X4 CRT-D Model G158 ICD in place 10/28/13  Z95.810 PCV ECHOCARDIOGRAM COMPLETE    5. Gastroesophageal reflux disease without esophagitis  K21.9 omeprazole (PRILOSEC) 40 MG capsule      Orders Placed This Encounter  Procedures   EKG 12-Lead   PCV ECHOCARDIOGRAM COMPLETE    Standing Status:   Future    Standing Expiration Date:   07/17/2023   Meds ordered this encounter  Medications   omeprazole (PRILOSEC) 40 MG capsule    Sig: Take 1 capsule (40 mg total) by mouth daily before breakfast.    Dispense:  90 capsule    Refill:  0    Medications Discontinued During This Encounter  Medication Reason   omeprazole (PRILOSEC) 40 MG capsule Reorder     Recommendations:   Yvonne Miller  is a 72 y.o.  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  She also has morbid obesity, OSA not on CPAP recently, hypertension, DM with stage 3a-b CKD, hyperlipidemia, degenerative joint disease and recent diagnosis of diabetic gastroparesis.  She presents for a 82-month office visit.  1. Non-ischemic cardiomyopathy (HCC) Patient presently doing well, she has not had any recent hospitalization and has not had any acute decompensated heart failure.  She is doing her best with regard to physical activity, has been going to the water aerobics at least 3 times a week and also has started to walk.  2. Type 2 diabetes mellitus with stage 3b chronic kidney disease, with long-term current use of insulin (HCC) Reviewed her external labs, diabetes is under excellent control.  She has stage IIIb chronic kidney disease that has remained stable as well.  Could consider addition of Chauncey Mann for renal protection and she is presently on Trulicity, states that it does not appear to be helping her with weight loss, we could see whether Ozempic would be appropriate for  her.  Will discuss with Dr. Allyne Gee.  3. ICD -  Biventricular  [employer] Dynagen X4 CRT-D Model G158 ICD in place 10/28/13 I reviewed ICD data from transmission last night, she has had no atrial fibrillation, no high ventricular rate episodes, physical activity has been fair.  Normal function.  4. Gastroesophageal reflux disease without esophagitis I refilled the prescription for omeprazole.  Otherwise stable from cardiac standpoint, I will see her back in 6 months and I will repeat echocardiogram to follow-up on her LVEF.    Yates Decamp, MD, [facility] 07/17/2022, 9:27 AM Office: [phone]  Pager: [phone]

## 2022-07-17 ENCOUNTER — Ambulatory Visit: Payer: Medicare Other | Admitting: Cardiology

## 2022-07-17 ENCOUNTER — Encounter: Payer: Self-pay | Admitting: Cardiology

## 2022-07-17 ENCOUNTER — Other Ambulatory Visit: Payer: Self-pay | Admitting: Internal Medicine

## 2022-07-17 VITALS — BP 125/60 | HR 71 | Ht 62.0 in | Wt 259.0 lb

## 2022-07-17 DIAGNOSIS — E1122 Type 2 diabetes mellitus with diabetic chronic kidney disease: Secondary | ICD-10-CM

## 2022-07-17 DIAGNOSIS — K219 Gastro-esophageal reflux disease without esophagitis: Secondary | ICD-10-CM

## 2022-07-17 DIAGNOSIS — I447 Left bundle-branch block, unspecified: Secondary | ICD-10-CM

## 2022-07-17 DIAGNOSIS — I428 Other cardiomyopathies: Secondary | ICD-10-CM

## 2022-07-17 DIAGNOSIS — N1832 Chronic kidney disease, stage 3b: Secondary | ICD-10-CM | POA: Diagnosis not present

## 2022-07-17 DIAGNOSIS — Z794 Long term (current) use of insulin: Secondary | ICD-10-CM

## 2022-07-17 DIAGNOSIS — Z95810 Presence of automatic (implantable) cardiac defibrillator: Secondary | ICD-10-CM | POA: Diagnosis not present

## 2022-07-17 DIAGNOSIS — I130 Hypertensive heart and chronic kidney disease with heart failure and stage 1 through stage 4 chronic kidney disease, or unspecified chronic kidney disease: Secondary | ICD-10-CM

## 2022-07-17 DIAGNOSIS — I1310 Hypertensive heart and chronic kidney disease without heart failure, with stage 1 through stage 4 chronic kidney disease, or unspecified chronic kidney disease: Secondary | ICD-10-CM | POA: Diagnosis not present

## 2022-07-17 MED ORDER — OMEPRAZOLE 40 MG PO CPDR: 40.0000 mg | DELAYED_RELEASE_CAPSULE | Freq: Every day | ORAL | 0 refills | Status: DC

## 2022-07-18 ENCOUNTER — Telehealth: Payer: Self-pay

## 2022-07-18 NOTE — Progress Notes (Cosign Needed)
07-18-2022: Left patient a VM to schedule appointment with [name]. [name] contacted patient today to schedule.  [place] Pharmacist Assistant [phone]

## 2022-07-18 NOTE — Telephone Encounter (Signed)
Called Yvonne Miller to schedule an in-person appointment to counsel her on how to use Ozempic and will work through patient assistance application with her.  Patient agreed to having an appointment on [id number] at [phone] AM. Application to be signed by patient and Dr. Baird Cancer.   [name], CPP, PharmD Clinical Pharmacist Practitioner [facility] [phone]

## 2022-07-22 ENCOUNTER — Ambulatory Visit: Payer: Medicare Other

## 2022-07-22 MED ORDER — OZEMPIC (0.25 OR 0.5 MG/DOSE) 2 MG/1.5ML ~~LOC~~ SOPN: 0.5000 mg | PEN_INJECTOR | SUBCUTANEOUS | 0 refills | Status: AC

## 2022-07-22 NOTE — Progress Notes (Signed)
Care Management & Coordination Services Pharmacy Note  07/22/2022 Name:  Yvonne Miller MRN:  [id number] DOB:  December 20, 1950  Summary: Patient reports that she has been doing well but she would like to lose some weight.   Recommendations/Changes made from today's visit: Recommend patient start Ozempic 0.5 mg once per week on Wednesday.  Discussed in great detail that we will need to know if patient experiences any side effects as discussed when she starts Ozempic so we can determine if she is going to continue to take the medication prior to cancelling Trulicity patient assistance because the program is not open to new enrollments  Recommend CGM for the patient to use to check BS  Follow up plan: Patient to call on 08/06/2022 to review how she is doing with Ozempic dosing  Patients dose to be increased to 1 mg once per week after a month of taking medication  Patient assistance application for Ozempic 1 mg to be signed by PCP  Application to be faxed to [employer] once signed by PCP    Subjective: Yvonne Miller is an 72 y.o. year old female who is a primary patient of [name], MD.  The care coordination team was consulted for assistance with disease management and care coordination needs.    Engaged with patient face to face for follow up visit.  Recent office visits: 06/19/2022 PCP OV   Recent consult visits: 07/17/2022 Cardiology [facility] visits: None in previous 6 months   Objective:  Lab Results  Component Value Date   CREATININE 1.53 (H) 06/19/2022   BUN 36 (H) 06/19/2022   EGFR 36 (L) 06/19/2022   GFRNONAA 34 (L) 11/14/2020   GFRAA 45 (L) 06/21/2020   NA 139 06/19/2022   K 4.5 06/19/2022   CALCIUM 9.7 06/19/2022   CO2 24 06/19/2022   GLUCOSE 132 (H) 06/19/2022    Lab Results  Component Value Date/Time   HGBA1C 6.9 (H) 06/19/2022 09:22 AM   HGBA1C 6.7 (H) 03/18/2022 09:16 AM   MICROALBUR 8.5 08/08/2021 12:00 AM   MICROALBUR 30 10/10/2020 12:38 PM    MICROALBUR 30 10/05/2019 12:07 PM    Last diabetic Eye exam:  Lab Results  Component Value Date/Time   HMDIABEYEEXA No Retinopathy 12/17/2021 12:00 AM    Last diabetic Foot exam: No results found for: "HMDIABFOOTEX"   Lab Results  Component Value Date   CHOL 135 10/21/2021   HDL 45 10/21/2021   LDLCALC 66 10/21/2021   TRIG 137 10/21/2021   CHOLHDL 3.0 10/21/2021       Latest Ref Rng & Units 06/19/2022    9:22 AM 03/18/2022    9:16 AM 10/21/2021    9:59 AM  Hepatic Function  Total Protein 6.0 - 8.5 g/dL 7.4  7.0  7.5   Albumin 3.8 - 4.8 g/dL  4.1  4.2   AST 0 - 40 IU/L  15  14   ALT 0 - 32 IU/L  12  12   Alk Phosphatase 44 - 121 IU/L  105  109   Total Bilirubin 0.0 - 1.2 mg/dL  0.2  0.2     Lab Results  Component Value Date/Time   TSH 1.290 10/21/2021 09:59 AM   TSH 1.110 06/21/2020 11:37 AM       Latest Ref Rng & Units 10/21/2021    9:59 AM 11/14/2020   10:04 AM 06/21/2020   11:37 AM  CBC  WBC 3.4 - 10.8 x10E3/uL 8.7  10.1  10.5   Hemoglobin 11.1 - 15.9 g/dL 12.1  12.0  11.6   Hematocrit 34.0 - 46.6 % 37.9  37.7  36.8   Platelets 150 - 450 x10E3/uL 369  300  333     Lab Results  Component Value Date/Time   VD25OH 39.8 02/12/2021 09:10 AM   VITAMINB12 787 10/21/2021 09:59 AM    Clinical ASCVD: No  The 10-year ASCVD risk score (Arnett DK, et al., 2019) is: 20.2%   Values used to calculate the score:     Age: 72 years     Sex: Female     Is Non-Hispanic African American: Yes     Diabetic: Yes     Tobacco smoker: No     Systolic Blood Pressure: 0000000 mmHg     Is BP treated: Yes     HDL Cholesterol: 45 mg/dL     Total Cholesterol: 135 mg/dL        06/19/2022    8:26 AM 07/03/2021    9:16 AM 11/22/2020    8:51 AM  Depression screen PHQ 2/9  Decreased Interest 0 0 0  Down, Depressed, Hopeless 0 0 0  PHQ - 2 Score 0 0 0     Social History   Tobacco Use  Smoking Status Never  Smokeless Tobacco Never   BP Readings from Last 3 Encounters:  07/17/22  125/60  06/19/22 135/68  03/18/22 124/80   Pulse Readings from Last 3 Encounters:  07/17/22 71  06/19/22 71  03/18/22 78   Wt Readings from Last 3 Encounters:  07/17/22 259 lb (117.5 kg)  06/19/22 250 lb (113.4 kg)  03/18/22 258 lb (117 kg)   BMI Readings from Last 3 Encounters:  07/17/22 47.37 kg/m  06/19/22 45.73 kg/m  03/18/22 45.99 kg/m    Allergies  Allergen Reactions   Other Other (See Comments)    NO BLOOD   . Jehovah witness    Medications Reviewed Today     Reviewed by Adrian Prows, MD (Physician) on 07/17/22 at [facility] Status: <None>   Medication Order Taking? Sig Documenting Provider Last Dose Status Informant  ACCU-CHEK AVIVA PLUS test strip [id number] Yes USE TO CHECK BLOOD SUGAR 4 TIMES A Lynnell Dike, MD Taking Active   acetaminophen (TYLENOL) 500 MG tablet [id number] Yes Take 500 mg by mouth every 6 (six) hours as needed for moderate pain. [provider] Taking Active   albuterol (PROVENTIL HFA;VENTOLIN HFA) 108 (90 Base) MCG/ACT inhaler [id number] Yes Inhale 2 puffs into the lungs every 4 (four) hours as needed for wheezing or shortness of breath.  [provider] Taking Active Self  carvedilol (COREG) 25 MG tablet [id number] Yes TAKE 1 AND 1/2 TABLETS BY MOUTH  TWICE DAILY Adrian Prows, MD Taking Active   Cholecalciferol (VITAMIN D) 50 MCG (2000 UT) tablet [id number] Yes Take 2,000 Units by mouth daily. [provider] Taking Active   Chromium 1000 MCG TABS [id number] Yes Take 1,000 mcg by mouth daily.  [provider] Taking Active   Cinnamon 500 MG capsule [id number] Yes Take 500 mg by mouth daily. [provider] Taking Active Self           Med Note Jannifer Rodney, APRIL   Thu Oct 21, 2018  8:17 AM)    estradiol (ESTRACE) 0.1 MG/GM vaginal cream [id number] Yes Place vaginally. [provider] Taking Active   fluticasone (FLONASE) 50 MCG/ACT nasal spray [id number] Yes Place 1 spray into both nostrils  daily.  Patient taking differently: Place 1 spray into both nostrils as needed for allergies.   [name], MD Taking Active   furosemide (LASIX) 40 MG tablet [id number] Yes TAKE 1 TABLET BY MOUTH EVERY DAY IN THE Alfonso Patten, MD Taking Active   hydrALAZINE (APRESOLINE) 50 MG tablet [id number] Yes TAKE 1 TABLET BY MOUTH TWICE  DAILY Adrian Prows, MD Taking Active   Insulin Lispro Prot & Lispro (HUMALOG MIX 75/25 KWIKPEN) (75-25) 100 UNIT/ML Claiborne Rigg [id number] Yes Inject 55 Units into the skin at bedtime. [name], MD Taking Active   nystatin (NYSTATIN) powder [id number] Yes Apply topically 3 (three) times daily as needed. As needed [name], MD Taking Active   Omega-3 Fatty Acids (FISH OIL) 500 MG CAPS [id number] Yes Take 500 mg by mouth daily. [provider] Taking Active   omeprazole (PRILOSEC) 40 MG capsule [id number] No Take 40 mg by mouth daily.  Patient not taking: Reported on 07/17/2022   [provider] Not Taking Active   pravastatin (PRAVACHOL) 40 MG tablet [id number] Yes TAKE 1 TABLET BY MOUTH  DAILY [name], MD Taking Active   Probiotic Product (PROBIOTIC PO) [id number] Yes Take by mouth. [provider] Taking Active   pyridOXINE (VITAMIN B6) 25 MG tablet [id number] Yes Take 25 mg by mouth daily. [provider] Taking Active   TRULICITY 1.5 0000000 SOPN [id number] Yes INJECT 1.5 MG INTO THE SKIN ONCE A WEEK.  Patient taking differently: Inject 2 mg as directed every Wednesday.   [name], MD Taking Active   valsartan-hydrochlorothiazide (DIOVAN-HCT) 160-25 MG tablet [id number] Yes TAKE 1 TABLET BY MOUTH  DAILY [name], MD Taking Active   vitamin B-12 (CYANOCOBALAMIN) 100 MCG tablet [id number] Yes Take 100 mcg by mouth daily. [provider] Taking Active   VYZULTA 0.024 % SOLN [id number] Yes Apply 1-2 drops to eye daily. [provider] Taking Active             SDOH:  (Social Determinants of  Health) assessments and interventions performed: Yes SDOH Interventions    Flowsheet Row Chronic Care Management from 08/08/2020 in [facility] Chronic Care Management from 04/25/2020 in [facility] Chronic Care Management from 11/15/2019 in [facility] Clinical Support from 06/16/2019 in [facility] Clinical Support from 09/30/2018 in [facility]  SDOH Interventions       Depression Interventions/Treatment  -- -- -- [id number] Score <4 Follow-up Not Indicated PHQ2-9 Score <4 Follow-up Not Indicated  Financial Strain Interventions --  [patient assistance application] --  [patient assistance for medication] Other (Comment)  [Assisted with coordination of Humalog 75/25 delivery from [name] [facility] via [facility]] -- --       Medication Assistance: Application for Ozempic  medication assistance program. in process.  Anticipated assistance start date 08/2022.  See plan of care for additional detail.  Medication Access: Within the past 30 days, how often has patient missed a dose of medication? No  Is a pillbox or other method used to improve adherence? Yes - Ms. Lingelbach is using a makeup case to keep her medication intact Factors that may affect medication adherence? financial need and lack of understanding of disease management Are meds synced by current pharmacy? No  Are meds delivered by current pharmacy? Yes  Does patient experience delays in picking up medications due to transportation concerns? No   [facility] Reviewed: Is patient disadvantaged  to use [facility]?: Yes  Current Rx insurance plan: [employer] Name and location of Current pharmacy:  [facility] ([place], [place] [address] [address] [place] [address] Phone:  [phone] Fax: [phone]  [id number] - [place], [place] - [address] AT [address] [place] [address] Phone: [phone] Fax: [place], [place] [address] [place] [address] Phone: [phone] Fax: [phone]  [facility] services reviewed with patient today?: No  Patient requests to transfer care to [facility]?: No  Reason patient declined to change pharmacies: Disadvantaged due to insurance/mail order  Compliance/Adherence/Medication fill history: Care Gaps: COVID-19 Vaccine  Medicare Annual Wellness Visit   Star-Rating Drugs: Pravastatin 40 mg tablet  Trulicity 1.5 mg  Valsartan-Hydrochlorothiazide 160-25 mg tablet   Assessment/Plan   Diabetes (A1c goal <7%) -Controlled -Current medications: Trulicity 1.5 mg once per week on Wednesday  Appropriate, Query effective Insulin Lispro prot & Lispro Humalog Mix 75/25 - inject 55 units into the skin at bedtime Appropriate, Effective, Safe, Accessible -Current home glucose readings: she did not bring her log book with her today for her in office visit  fasting glucose: 125, 90-120 post prandial glucose: none noted -Denies hypoglycemic/hyperglycemic symptoms -Current meal patterns:  breakfast: an egg with fruit, if going to the [employer]  lunch: chipotle salad, did not specify type  dinner: Collard greens, beef ribs and mac and cheese  snacks: apples, oranges, sometimes cake  drinks: she is drinking at least 3-4 bottles of Aquafina water per day  -Current exercise: she is exercising for an hour three days per week, and walking at least two days per week  -Educated on Carbohydrate counting and/or plate method -Counseled on the proper injection technique and method for Ozempic 0.5 mg pen, patient was given a pen in office -She was able to demonstrate how to use pen by  using a sample injection pen for Ozempic, we discussed the storage method and proper injection site -Reviewed in great detail the patient assistance steps for the application and approval, she signed all documentation  -Counseled to check feet daily and get yearly eye exams -Counseled on diet and exercise extensively Collaborated with PCP and patient to switch patient to Ozempic 0.5 mg once per week, sample given Ozempic 0.5 mg NCD: [id number], Expiration date: 03/04/2024, LOT: [id number] -Patient to start Ozempic 1 mg once per week if she is able to tolerate the medication.     [name], CPP, PharmD Clinical Pharmacist Practitioner [facility] [phone]

## 2022-07-23 ENCOUNTER — Ambulatory Visit (INDEPENDENT_AMBULATORY_CARE_PROVIDER_SITE_OTHER): Payer: Medicare Other

## 2022-07-23 ENCOUNTER — Ambulatory Visit: Payer: Medicare Other | Admitting: Internal Medicine

## 2022-07-23 VITALS — Ht 64.0 in | Wt 254.0 lb

## 2022-07-23 DIAGNOSIS — Z0000 Encounter for general adult medical examination without abnormal findings: Secondary | ICD-10-CM

## 2022-07-23 NOTE — Patient Instructions (Signed)
[name] , Thank you for taking time to come for your Medicare Wellness Visit. I appreciate your ongoing commitment to your health goals. Please review the following plan we discussed and let me know if I can assist you in the future.   These are the goals we discussed:  Goals      Monitor and Manage My Blood Sugar-Diabetes Type 2     Timeframe:  Long-Range Goal Priority:  High Start Date:                             Expected End Date:                       Follow Up Date 10/04/2021  In Progress:   - check blood sugar at prescribed times - take the blood sugar log to all doctor visits - take the blood sugar meter to all doctor visits    Why is this important?   Checking your blood sugar at home helps to keep it from getting very high or very low.  Writing the results in a diary or log helps the doctor know how to care for you.  Your blood sugar log should have the time, date and the results.  Also, write down the amount of insulin or other medicine that you take.  Other information, like what you ate, exercise done and how you were feeling, will also be helpful.        Patient Stated     06/21/2020, wants to lose 100 pounds     Patient Stated     07/03/2021, start back water aerobics     Patient Stated     07/23/2022, wants to lose weight and increase exercise by two days     Weight (lb) < 200 lb (90.7 kg)     Weight (lb) < 200 lb (90.7 kg)     06/16/2019, wants to lose 13 pounds by stop eating at night        This is a list of the screening recommended for you and due dates:  Health Maintenance  Topic Date Due   COVID-19 Vaccine (6 - 2023-24 season) 04/28/2022   Yearly kidney health urinalysis for diabetes  08/09/2022   Complete foot exam   10/22/2022   Hemoglobin A1C  12/18/2022   Eye exam for diabetics  12/18/2022   Mammogram  12/27/2022   Yearly kidney function blood test for diabetes  06/20/2023   Medicare Annual Wellness Visit  07/23/2023   Colon Cancer  Screening  02/28/2029   DTaP/Tdap/Td vaccine (4 - Td or Tdap) 06/21/2030   Pneumonia Vaccine  Completed   Flu Shot  Completed   DEXA scan (bone density measurement)  Completed   Hepatitis C Screening: USPSTF Recommendation to screen - Ages 63-79 yo.  Completed   Zoster (Shingles) Vaccine  Completed   HPV Vaccine  Aged Out    Advanced directives: copy in chart  Conditions/risks identified: none  Next appointment: Follow up in one year for your annual wellness visit    Preventive Care 65 Years and Older, Female Preventive care refers to lifestyle choices and visits with your health care provider that can promote health and wellness. What does preventive care include? A yearly physical exam. This is also called an annual well check. Dental exams once or twice a year. Routine eye exams. Ask your health care provider how often you should have  your eyes checked. Personal lifestyle choices, including: Daily care of your teeth and gums. Regular physical activity. Eating a healthy diet. Avoiding tobacco and drug use. Limiting alcohol use. Practicing safe sex. Taking low-dose aspirin every day. Taking vitamin and mineral supplements as recommended by your health care provider. What happens during an annual well check? The services and screenings done by your health care provider during your annual well check will depend on your age, overall health, lifestyle risk factors, and family history of disease. Counseling  Your health care provider may ask you questions about your: Alcohol use. Tobacco use. Drug use. Emotional well-being. Home and relationship well-being. Sexual activity. Eating habits. History of falls. Memory and ability to understand (cognition). Work and work Statistician. Reproductive health. Screening  You may have the following tests or measurements: Height, weight, and BMI. Blood pressure. Lipid and cholesterol levels. These may be checked every 5 years, or  more frequently if you are over 62 years old. Skin check. Lung cancer screening. You may have this screening every year starting at age 65 if you have a 30-pack-year history of smoking and currently smoke or have quit within the past 15 years. Fecal occult blood test (FOBT) of the stool. You may have this test every year starting at age 11. Flexible sigmoidoscopy or colonoscopy. You may have a sigmoidoscopy every 5 years or a colonoscopy every 10 years starting at age 28. Hepatitis C blood test. Hepatitis B blood test. Sexually transmitted disease (STD) testing. Diabetes screening. This is done by checking your blood sugar (glucose) after you have not eaten for a while (fasting). You may have this done every 1-3 years. Bone density scan. This is done to screen for osteoporosis. You may have this done starting at age 62. Mammogram. This may be done every 1-2 years. Talk to your health care provider about how often you should have regular mammograms. Talk with your health care provider about your test results, treatment options, and if necessary, the need for more tests. Vaccines  Your health care provider may recommend certain vaccines, such as: Influenza vaccine. This is recommended every year. Tetanus, diphtheria, and acellular pertussis (Tdap, Td) vaccine. You may need a Td booster every 10 years. Zoster vaccine. You may need this after age 91. Pneumococcal 13-valent conjugate (PCV13) vaccine. One dose is recommended after age 20. Pneumococcal polysaccharide (PPSV23) vaccine. One dose is recommended after age 11. Talk to your health care provider about which screenings and vaccines you need and how often you need them. This information is not intended to replace advice given to you by your health care provider. Make sure you discuss any questions you have with your health care provider. Document Released: 05/18/2015 Document Revised: 01/09/2016 Document Reviewed: 02/20/2015 Elsevier  Interactive Patient Education  2017 [place] Prevention in the Home Falls can cause injuries. They can happen to people of all ages. There are many things you can do to make your home safe and to help prevent falls. What can I do on the outside of my home? Regularly fix the edges of walkways and driveways and fix any cracks. Remove anything that might make you trip as you walk through a door, such as a raised step or threshold. Trim any bushes or trees on the path to your home. Use bright outdoor lighting. Clear any walking paths of anything that might make someone trip, such as rocks or tools. Regularly check to see if handrails are loose or broken. Make sure that  both sides of any steps have handrails. Any raised decks and porches should have guardrails on the edges. Have any leaves, snow, or ice cleared regularly. Use sand or salt on walking paths during winter. Clean up any spills in your garage right away. This includes oil or grease spills. What can I do in the bathroom? Use night lights. Install grab bars by the toilet and in the tub and shower. Do not use towel bars as grab bars. Use non-skid mats or decals in the tub or shower. If you need to sit down in the shower, use a plastic, non-slip stool. Keep the floor dry. Clean up any water that spills on the floor as soon as it happens. Remove soap buildup in the tub or shower regularly. Attach bath mats securely with double-sided non-slip rug tape. Do not have throw rugs and other things on the floor that can make you trip. What can I do in the bedroom? Use night lights. Make sure that you have a light by your bed that is easy to reach. Do not use any sheets or blankets that are too big for your bed. They should not hang down onto the floor. Have a firm chair that has side arms. You can use this for support while you get dressed. Do not have throw rugs and other things on the floor that can make you trip. What can I do  in the kitchen? Clean up any spills right away. Avoid walking on wet floors. Keep items that you use a lot in easy-to-reach places. If you need to reach something above you, use a strong step stool that has a grab bar. Keep electrical cords out of the way. Do not use floor polish or wax that makes floors slippery. If you must use wax, use non-skid floor wax. Do not have throw rugs and other things on the floor that can make you trip. What can I do with my stairs? Do not leave any items on the stairs. Make sure that there are handrails on both sides of the stairs and use them. Fix handrails that are broken or loose. Make sure that handrails are as long as the stairways. Check any carpeting to make sure that it is firmly attached to the stairs. Fix any carpet that is loose or worn. Avoid having throw rugs at the top or bottom of the stairs. If you do have throw rugs, attach them to the floor with carpet tape. Make sure that you have a light switch at the top of the stairs and the bottom of the stairs. If you do not have them, ask someone to add them for you. What else can I do to help prevent falls? Wear shoes that: Do not have high heels. Have rubber bottoms. Are comfortable and fit you well. Are closed at the toe. Do not wear sandals. If you use a stepladder: Make sure that it is fully opened. Do not climb a closed stepladder. Make sure that both sides of the stepladder are locked into place. Ask someone to hold it for you, if possible. Clearly mark and make sure that you can see: Any grab bars or handrails. First and last steps. Where the edge of each step is. Use tools that help you move around (mobility aids) if they are needed. These include: Canes. Walkers. Scooters. Crutches. Turn on the lights when you go into a dark area. Replace any light bulbs as soon as they burn out. Set up your furniture so you  have a clear path. Avoid moving your furniture around. If any of your  floors are uneven, fix them. If there are any pets around you, be aware of where they are. Review your medicines with your doctor. Some medicines can make you feel dizzy. This can increase your chance of falling. Ask your doctor what other things that you can do to help prevent falls. This information is not intended to replace advice given to you by your health care provider. Make sure you discuss any questions you have with your health care provider. Document Released: 02/15/2009 Document Revised: 09/27/2015 Document Reviewed: 05/26/2014 Elsevier Interactive Patient Education  2017 [employer].

## 2022-07-23 NOTE — Progress Notes (Signed)
I connected with  Yvonne Miller on 07/23/22 by a audio enabled telemedicine application and verified that I am speaking with the correct person using two identifiers.  Patient Location: Home  Provider Location: Office/Clinic  I discussed the limitations of evaluation and management by telemedicine. The patient expressed understanding and agreed to proceed.  Subjective:   Yvonne Miller is a 72 y.o. female who presents for Medicare Annual (Subsequent) preventive examination.  Review of Systems     Cardiac Risk Factors include: advanced age (>9men, >79 women);diabetes mellitus;hypertension;obesity (BMI >30kg/m2)     Objective:    Today's Vitals   07/23/22 0841  Weight: 254 lb (115.2 kg)  Height: 5\' 4"  (1.626 m)   Body mass index is 43.6 kg/m.     07/23/2022    8:47 AM 07/03/2021    9:15 AM 11/14/2020    9:43 AM 06/21/2020    9:21 AM 09/24/2019   11:33 AM 06/16/2019    9:13 AM 03/01/2019    6:31 AM  Advanced Directives  Does Patient Have a Medical Advance Directive? Yes Yes Yes Yes Yes Yes   Type of Paramedic of [place];Living will [place];Living will  [place];Living will [place];Living will [place];Living will [place];Living will  Copy of [place] in Chart? Yes - validated most recent copy scanned in chart (See row information) Yes - validated most recent copy scanned in chart (See row information)  Yes - validated most recent copy scanned in chart (See row information)  Yes - validated most recent copy scanned in chart (See row information) Yes - validated most recent copy scanned in chart (See row information)    Current Medications (verified) Outpatient Encounter Medications as of 07/23/2022  Medication Sig   ACCU-CHEK AVIVA PLUS test strip USE TO CHECK BLOOD SUGAR 4 TIMES A DAY   acetaminophen (TYLENOL) 500 MG tablet Take 500  mg by mouth every 6 (six) hours as needed for moderate pain.   albuterol (PROVENTIL HFA;VENTOLIN HFA) 108 (90 Base) MCG/ACT inhaler Inhale 2 puffs into the lungs every 4 (four) hours as needed for wheezing or shortness of breath.    carvedilol (COREG) 25 MG tablet TAKE 1 AND 1/2 TABLETS BY MOUTH  TWICE DAILY   Cholecalciferol (VITAMIN D) 50 MCG (2000 UT) tablet Take 2,000 Units by mouth daily.   Chromium 1000 MCG TABS Take 1,000 mcg by mouth daily.    Cinnamon 500 MG capsule Take 500 mg by mouth daily.   estradiol (ESTRACE) 0.1 MG/GM vaginal cream Place vaginally.   furosemide (LASIX) 40 MG tablet TAKE 1 TABLET BY MOUTH EVERY DAY IN THE MORNING   hydrALAZINE (APRESOLINE) 50 MG tablet TAKE 1 TABLET BY MOUTH TWICE  DAILY   Insulin Lispro Prot & Lispro (HUMALOG MIX 75/25 KWIKPEN) (75-25) 100 UNIT/ML Kwikpen Inject 55 Units into the skin at bedtime.   nystatin (NYSTATIN) powder Apply topically 3 (three) times daily as needed. As needed   Omega-3 Fatty Acids (FISH OIL) 500 MG CAPS Take 500 mg by mouth daily.   omeprazole (PRILOSEC) 40 MG capsule Take 1 capsule (40 mg total) by mouth daily before breakfast.   pravastatin (PRAVACHOL) 40 MG tablet TAKE 1 TABLET BY MOUTH  DAILY   Probiotic Product (PROBIOTIC PO) Take by mouth.   pyridOXINE (VITAMIN B6) 25 MG tablet Take 25 mg by mouth daily.   Semaglutide,0.25 or 0.5MG /DOS, (OZEMPIC, 0.25 OR 0.5  MG/DOSE,) 2 MG/1.5ML SOPN Inject 0.5 mg into the skin once a week.   valsartan-hydrochlorothiazide (DIOVAN-HCT) 160-25 MG tablet TAKE 1 TABLET BY MOUTH  DAILY   vitamin B-12 (CYANOCOBALAMIN) 100 MCG tablet Take 100 mcg by mouth daily.   VYZULTA 0.024 % SOLN Apply 1-2 drops to eye daily.   fluticasone (FLONASE) 50 MCG/ACT nasal spray Place 1 spray into both nostrils daily. (Patient taking differently: Place 1 spray into both nostrils as needed for allergies.)   No facility-administered encounter medications on file as of 07/23/2022.    Allergies  (verified) Other   History: Past Medical History:  Diagnosis Date   AICD (automatic cardioverter/defibrillator) present    Arthritis    Asthma    Carpal tunnel syndrome, bilateral    CKD (chronic kidney disease)    Diabetes mellitus    Encounter for assessment of implantable cardioverter-defibrillator (ICD)    GERD (gastroesophageal reflux disease)    Hyperlipemia    Hypertension    Obesity    morbid   Sleep apnea    wears CPAP set at 12   Wears glasses    Past Surgical History:  Procedure Laterality Date   ABDOMINAL HYSTERECTOMY     BIOPSY  03/01/2019   Procedure: BIOPSY;  Surgeon: Juanita Craver, MD;  Location: [facility];  Service: Endoscopy;;   BREAST SURGERY  1968   breast mass excision   CARPAL TUNNEL RELEASE Left 07/10/2017   Procedure: CARPAL TUNNEL RELEASE;  Surgeon: Ashok Pall, MD;  Location: [place];  Service: Neurosurgery;  Laterality: Left;  left   CARPAL TUNNEL RELEASE Right 03/19/2018   Procedure: RIGHT CARPAL TUNNEL RELEASE;  Surgeon: Ashok Pall, MD;  Location: [place];  Service: Neurosurgery;  Laterality: Right;  right   CERVICAL SPINE SURGERY  2006   CHOLECYSTECTOMY     COLONOSCOPY WITH PROPOFOL N/A 03/01/2019   Procedure: COLONOSCOPY WITH PROPOFOL;  Surgeon: Juanita Craver, MD;  Location: [facility];  Service: Endoscopy;  Laterality: N/A;   DILATION AND CURETTAGE OF UTERUS     ICD IMPLANT     JOINT REPLACEMENT     TONSILLECTOMY  1961   TOTAL KNEE ARTHROPLASTY Left 01/26/2018   Procedure: LEFT TOTAL KNEE ARTHROPLASTY;  Surgeon: Mcarthur Rossetti, MD;  Location: [place];  Service: Orthopedics;  Laterality: Left;   Family History  Problem Relation Age of Onset   Hypertension Mother    Stroke Mother    Heart disease Father    Heart attack Sister    Heart attack Brother    Social History   Socioeconomic History   Marital status: Married    Spouse name: Not on file   Number of children: 2   Years of education: Not on file   Highest education  level: Not on file  Occupational History   Occupation: retired  Tobacco Use   Smoking status: Never   Smokeless tobacco: Never  Vaping Use   Vaping Use: Never used  Substance and Sexual Activity   Alcohol use: No   Drug use: No   Sexual activity: Yes  Other Topics Concern   Not on file  Social History Narrative   Not on file   Social Determinants of Health   Financial Resource Strain: Low Risk  (07/23/2022)   Overall Financial Resource Strain (CARDIA)    Difficulty of Paying Living Expenses: Not hard at all  Recent Concern: Financial Resource Strain - [place] (07/22/2022)   Overall Financial Resource Strain (CARDIA)    Difficulty of  Paying Living Expenses: Very hard  Food Insecurity: No Food Insecurity (07/23/2022)   Hunger Vital Sign    Worried About Running Out of Food in the Last Year: Never true    Ran Out of Food in the Last Year: Never true  Transportation Needs: No Transportation Needs (07/23/2022)   PRAPARE - Hydrologist (Medical): No    Lack of Transportation (Non-Medical): No  Physical Activity: Sufficiently Active (07/23/2022)   Exercise Vital Sign    Days of Exercise per Week: 3 days    Minutes of Exercise per Session: 60 min  Stress: No Stress Concern Present (07/23/2022)   [place]    Feeling of Stress : Not at all  Social Connections: Not on file    Tobacco Counseling Counseling given: Not Answered   Clinical Intake:  Pre-visit preparation completed: Yes  Pain : No/denies pain     Nutritional Status: BMI > 30  Obese Nutritional Risks: None Diabetes: Yes  How often do you need to have someone help you when you read instructions, pamphlets, or other written materials from your doctor or pharmacy?: 1 - Never  Diabetic? Yes Nutrition Risk Assessment:  Has the patient had any N/V/D within the last 2 months?  No  Does the patient have any non-healing  wounds?  No  Has the patient had any unintentional weight loss or weight gain?  No   Diabetes:  Is the patient diabetic?  Yes  If diabetic, was a CBG obtained today?  No  Did the patient bring in their glucometer from home?  No  How often do you monitor your CBG's? Three times daily.   Financial Strains and Diabetes Management:  Are you having any financial strains with the device, your supplies or your medication? No .  Does the patient want to be seen by Chronic Care Management for management of their diabetes?  No  Would the patient like to be referred to a Nutritionist or for Diabetic Management?  No   Diabetic Exams:  Diabetic Eye Exam: Completed 12/17/2021 Diabetic Foot Exam: Completed 10/21/2021   Interpreter Needed?: No  Information entered by :: NAllen LPN   Activities of Daily Living    07/23/2022    8:48 AM  In your present state of health, do you have any difficulty performing the following activities:  Hearing? 0  Vision? 0  Difficulty concentrating or making decisions? 0  Walking or climbing stairs? 0  Dressing or bathing? 0  Doing errands, shopping? 0  Preparing Food and eating ? N  Using the Toilet? N  In the past six months, have you accidently leaked urine? N  Do you have problems with loss of bowel control? N  Managing your Medications? N  Managing your Finances? N  Housekeeping or managing your Housekeeping? N    Patient Care Team: [name], MD as PCP - General (Internal Medicine) [name], [facility] (Pharmacist)  Indicate any recent Medical Services you may have received from other than [facility] providers in the past year (date may be approximate).     Assessment:   This is a routine wellness examination for Yvonne Miller.  Hearing/Vision screen Vision Screening - Comments:: Regular eye exams, Dr. Idolina Primer  Dietary issues and exercise activities discussed: Current Exercise Habits: Structured exercise class, Type of exercise: Other - see  comments (water aerobics), Time (Minutes): 60, Frequency (Times/Week): 3, Weekly Exercise (Minutes/Week): 180   Goals Addressed  This Visit's Progress    Patient Stated       07/23/2022, wants to lose weight and increase exercise by two days       Depression Screen    07/23/2022    8:48 AM 06/19/2022    8:26 AM 07/03/2021    9:16 AM 11/22/2020    8:51 AM 06/21/2020    9:22 AM 10/05/2019    8:48 AM 06/16/2019    9:15 AM  PHQ 2/9 Scores  PHQ - 2 Score 0 0 0 0 0 0 0  PHQ- 9 Score       0    Fall Risk    07/23/2022    8:48 AM 06/19/2022    8:26 AM 07/03/2021    9:16 AM 11/22/2020    8:51 AM 06/21/2020    9:22 AM  Fall Risk   Falls in the past year? 0 0 0 0 0  Number falls in past yr: 0 0     Injury with Fall? 0 0     Risk for fall due to : Medication side effect No Fall Risks Medication side effect  Medication side effect  Follow up Falls prevention discussed;Education provided;Falls evaluation completed Falls evaluation completed Falls evaluation completed;Education provided;Falls prevention discussed  Falls evaluation completed;Education provided;Falls prevention discussed    FALL RISK PREVENTION PERTAINING TO THE HOME:  Any stairs in or around the home? Yes  If so, are there any without handrails? No  Home free of loose throw rugs in walkways, pet beds, electrical cords, etc? Yes  Adequate lighting in your home to reduce risk of falls? Yes   ASSISTIVE DEVICES UTILIZED TO PREVENT FALLS:  Life alert? No  Use of a cane, walker or w/c? No  Grab bars in the bathroom? No  Shower chair or bench in shower? No  Elevated toilet seat or a handicapped toilet? Yes   TIMED UP AND GO:  Was the test performed? No .      Cognitive Function:        07/23/2022    8:49 AM 07/03/2021    9:17 AM 06/21/2020    9:24 AM 06/16/2019    9:16 AM 09/30/2018    9:00 AM  6CIT Screen  What Year? 0 points 0 points 0 points 0 points 0 points  What month? 0 points 0 points 0 points 0  points 0 points  What time? 0 points 0 points 0 points 0 points 0 points  Count back from 20 0 points 0 points 0 points 0 points 0 points  Months in reverse 0 points 0 points 0 points 0 points 0 points  Repeat phrase 0 points 0 points 4 points 0 points 0 points  Total Score 0 points 0 points 4 points 0 points 0 points    Immunizations Immunization History  Administered Date(s) Administered   Fluad Quad(high Dose 65+) 02/07/2020, 02/12/2021, 03/18/2022   Hepatitis A 01/19/2012, 02/16/2012   Hepatitis B 01/19/2012, 02/16/2012   IPV 02/16/2012   Influenza, High Dose Seasonal PF 02/17/2016, 03/04/2018, 02/10/2019   Influenza-Unspecified 02/02/2014, 02/16/2014   MMR 04/04/1994   PFIZER(Purple Top)SARS-COV-2 Vaccination 06/11/2019, 07/02/2019, 02/21/2020, 03/03/2022   Pfizer Covid-19 Vaccine Bivalent Booster 64yrs & up 03/20/2021   Pneumococcal Conjugate-13 05/16/2020   Pneumococcal Polysaccharide-23 06/18/2021   Td 09/08/2006   Tdap 01/19/2012, 06/21/2020   Zoster Recombinat (Shingrix) 06/04/2021, 10/21/2021   Zoster, Live 12/15/2007    TDAP status: Up to date  Flu Vaccine status: Up to date  Pneumococcal  vaccine status: Up to date  Covid-19 vaccine status: Completed vaccines  Qualifies for Shingles Vaccine? Yes   Zostavax completed Yes   Shingrix Completed?: Yes  Screening Tests Health Maintenance  Topic Date Due   COVID-19 Vaccine (6 - 2023-24 season) 04/28/2022   Medicare Annual Wellness (AWV)  07/04/2022   Diabetic kidney evaluation - Urine ACR  08/09/2022   FOOT EXAM  10/22/2022   HEMOGLOBIN A1C  12/18/2022   OPHTHALMOLOGY EXAM  12/18/2022   MAMMOGRAM  12/27/2022   Diabetic kidney evaluation - eGFR measurement  06/20/2023   COLONOSCOPY (Pts 45-77yrs Insurance coverage will need to be confirmed)  02/28/2029   DTaP/Tdap/Td (4 - Td or Tdap) 06/21/2030   Pneumonia Vaccine 18+ Years old  Completed   INFLUENZA VACCINE  Completed   DEXA SCAN  Completed   Hepatitis C  Screening  Completed   Zoster Vaccines- Shingrix  Completed   HPV VACCINES  Aged Out    Health Maintenance  Health Maintenance Due  Topic Date Due   COVID-19 Vaccine (6 - 2023-24 season) 04/28/2022   Medicare Annual Wellness (AWV)  07/04/2022   Diabetic kidney evaluation - Urine ACR  08/09/2022    Colorectal cancer screening: Type of screening: Colonoscopy. Completed 03/01/2019. Repeat every 10 years  Mammogram status: Completed 12/26/2021. Repeat every year  Bone Density status: Completed 09/17/2017.   Lung Cancer Screening: (Low Dose CT Chest recommended if Age 78-80 years, 30 pack-year currently smoking OR have quit w/in 15years.) does not qualify.   Lung Cancer Screening Referral: no  Additional Screening:  Hepatitis C Screening: does qualify; Completed 05/28/2012  Vision Screening: Recommended annual ophthalmology exams for early detection of glaucoma and other disorders of the eye. Is the patient up to date with their annual eye exam?  Yes  Who is the provider or what is the name of the office in which the patient attends annual eye exams? Dr. Idolina Primer If pt is not established with a provider, would they like to be referred to a provider to establish care? No .   Dental Screening: Recommended annual dental exams for proper oral hygiene  Community Resource Referral / Chronic Care Management: CRR required this visit?  No   CCM required this visit?  No      Plan:     I have personally reviewed and noted the following in the patient's chart:   Medical and social history Use of alcohol, tobacco or illicit drugs  Current medications and supplements including opioid prescriptions. Patient is not currently taking opioid prescriptions. Functional ability and status Nutritional status Physical activity Advanced directives List of other physicians Hospitalizations, surgeries, and ER visits in previous 12 months Vitals Screenings to include cognitive, depression, and  falls Referrals and appointments  In addition, I have reviewed and discussed with patient certain preventive protocols, quality metrics, and best practice recommendations. A written personalized care plan for preventive services as well as general preventive health recommendations were provided to patient.     Kellie Simmering, LPN   [id number]   Nurse Notes: none  Due to this being a virtual visit, the after visit summary with patients personalized plan was offered to patient via mail or my-chart. Patient would like to access on my-chart

## 2022-07-25 ENCOUNTER — Other Ambulatory Visit: Payer: Self-pay | Admitting: Internal Medicine

## 2022-07-30 DIAGNOSIS — M65342 Trigger finger, left ring finger: Secondary | ICD-10-CM | POA: Diagnosis not present

## 2022-08-05 ENCOUNTER — Ambulatory Visit: Payer: Medicare Other | Admitting: Internal Medicine

## 2022-08-06 ENCOUNTER — Telehealth: Payer: Self-pay

## 2022-08-06 NOTE — Telephone Encounter (Signed)
Patient called to review how she has been feeling since starting Ozempic. She has not had any side effects or issues with the medication. Her BS in the morning continues to be in the 100's, today it was 104. She communicated concern about her Ozempic running out this week. I explained that her pens should last 4 weeks, if it appears that her medication is running out early she should let me know so I can follow up with Novo. She reports that she will call me next week if needed.   [name], CPP, PharmD Clinical Pharmacist Practitioner [facility] [phone]

## 2022-08-10 ENCOUNTER — Other Ambulatory Visit: Payer: Self-pay | Admitting: Cardiology

## 2022-08-10 DIAGNOSIS — I5042 Chronic combined systolic (congestive) and diastolic (congestive) heart failure: Secondary | ICD-10-CM

## 2022-08-15 ENCOUNTER — Telehealth: Payer: Self-pay

## 2022-08-15 ENCOUNTER — Other Ambulatory Visit: Payer: Self-pay | Admitting: Internal Medicine

## 2022-08-15 NOTE — Telephone Encounter (Signed)
Spoke with Yvonne Miller in regards to her current use of Ozempic. She called because she took her last dose on Tuesday. She reports that she has not had any issues with the medication but would like to find out next steps with medication. Per our last conversation I explained the pending status of her current Ozempic patient assistance. Patient to receive a sample pen of Ozempic 0.5 mg once per week. Also gave Yvonne Miller my number again for her convenience and the extension at [facility].   Yvonne Miller, CPP, PharmD Clinical Pharmacist Practitioner [facility] [phone]

## 2022-08-15 NOTE — Telephone Encounter (Signed)
Spoke with patient today and let her know that her sample of Ozempic is available for pick up next week. She reported understanding. She said that she will pick up between Monday and Wednesday of next week.  Cherylin Mylar, CPP, PharmD Clinical Pharmacist Practitioner [facility] [phone]

## 2022-08-20 ENCOUNTER — Telehealth: Payer: Self-pay

## 2022-08-20 NOTE — Progress Notes (Cosign Needed)
Patient approved to receive Ozempic through [employer] patient assistance program, enrollment ends 05/05/2023. A four month supply of medication is being shipped to providers office and should be received within 10-14 business days.   Billee Cashing, CMA Clinical Pharmacist Assistant [phone]

## 2022-08-22 DIAGNOSIS — N1832 Chronic kidney disease, stage 3b: Secondary | ICD-10-CM | POA: Diagnosis not present

## 2022-08-22 DIAGNOSIS — E1122 Type 2 diabetes mellitus with diabetic chronic kidney disease: Secondary | ICD-10-CM | POA: Diagnosis not present

## 2022-08-22 DIAGNOSIS — I129 Hypertensive chronic kidney disease with stage 1 through stage 4 chronic kidney disease, or unspecified chronic kidney disease: Secondary | ICD-10-CM | POA: Diagnosis not present

## 2022-08-22 DIAGNOSIS — I509 Heart failure, unspecified: Secondary | ICD-10-CM | POA: Diagnosis not present

## 2022-08-22 DIAGNOSIS — N390 Urinary tract infection, site not specified: Secondary | ICD-10-CM | POA: Diagnosis not present

## 2022-08-26 ENCOUNTER — Telehealth: Payer: Self-pay

## 2022-08-26 NOTE — Telephone Encounter (Signed)
Patient called CPP number to confirm that the only medication that was being discontinued from [employer] was Trulicity, I assured her that was the case.  I encouraged patient to call the pharmacy team with any questions linked to medication so we can support her directly.   Patient reports receiving her letter of Ozempic approval. I congratulated and confirmed again, that we will let her know when the medication is delivered to the office.  She voiced understanding and still has her sample pen of Ozempic.  Will have HC reach out to Ms. Sheils to reintroduce herself and give her their phone number to call with any questions.  Cherylin Mylar, CPP, PharmD Clinical Pharmacist Practitioner [facility] [phone]

## 2022-08-27 ENCOUNTER — Telehealth: Payer: Self-pay

## 2022-08-27 NOTE — Progress Notes (Signed)
08-27-2022: Contacted patient to give her my contact information. We spent some time on the phone trying to figure out why her phone doesn't ring when I call. Called Patient's preferred number on three way and it popped up as spam. Patient them saved my number and I was able to receive calls. Patient is aware to reach out to me with any concerns.  Huey Romans [facility] Clinical Pharmacist Assistant [phone]

## 2022-09-02 ENCOUNTER — Telehealth: Payer: Self-pay

## 2022-09-02 NOTE — Progress Notes (Signed)
Patient aware 4 boxes of Ozempic has arrived to PCP office, [employer] patient assistance.   Billee Cashing, CMA Clinical Pharmacist Assistant [phone]

## 2022-09-03 ENCOUNTER — Telehealth: Payer: Self-pay

## 2022-09-03 NOTE — Progress Notes (Signed)
Care Management & Coordination Services Pharmacy Team  Reason for Encounter: Appointment Reminder  Contacted patient to confirm telephone appointment with Cherylin Mylar, PharmD on 09-05-2022 at 10:00. Spoke with patient on 09/03/2022   Do you have any problems getting your medications? No  What is your top health concern you would like to discuss at your upcoming visit? Patient stated none  Have you seen any other providers since your last visit with PCP? Yes   Chart review: Recent office visits:  07-23-2022 Barb Merino, LPN. Medicare wellness visit.   Recent consult visits:  None  Hospital visits:  None in previous 6 months   Star Rating Drugs:  Pravastatin 40 mg- Last filled 07-30-2022 100 DS [facility]. Previous 05-21-2022 90 DS Valsartan/HCTZ 160-25 mg- Last filled 07-30-2022 100 DS [facility]. Previous 05-21-2022 90 DS  Care Gaps: Annual wellness visit in last year? Yes  If Diabetic: Last eye exam / retinopathy screening: 12-17-2021 Last diabetic foot exam: None   Huey Romans [facility] Clinical Pharmacist Assistant [phone]

## 2022-09-05 ENCOUNTER — Ambulatory Visit: Payer: Medicare Other

## 2022-09-05 MED ORDER — OZEMPIC (1 MG/DOSE) 2 MG/1.5ML ~~LOC~~ SOPN: 1.0000 mg | PEN_INJECTOR | SUBCUTANEOUS | 12 refills | Status: DC

## 2022-09-05 NOTE — Progress Notes (Signed)
Care Management & Coordination Services Pharmacy Note  09/05/2022 Name:  Yvonne Miller MRN:  [id number] DOB:  02/06/1951  Summary: Patient reports that the Ozempic has been working well.   Recommendations/Changes made from today's visit: Recommend Ms. Dunlow begin taking Ozempic 1 mg once per week in two weeks, once she is done with the 0.5 mg sample  Recommend she chew her food at least 32 -40 times prior to having another bite  Ms. Faidley to record  her BS readings daily and give readings to [facility] during general assessment call in 1 week and then another call in 2 weeks   Follow up plan: She has a goal of losing at least 8-12 pounds in the next two months by limiting unhealthy foods in her diet  She is going to drink at least 4 - 16 ounce bottles of water per day for the next two months.  HC to reach out to Ms. Hughley in two weeks once she starts the new dose of Ozempic 1 mg.  Microalbumin testing to be completed during physical with PCP in June.    Subjective: Yvonne Miller is an 72 y.o. year old female who is a primary patient of [name], MD.  The care coordination team was consulted for assistance with disease management and care coordination needs.    Engaged with patient by telephone for follow up visit.  Recent office visits:  07-23-2022 Barb Merino, LPN. Medicare wellness visit.    Recent consult visits:  None   Hospital visits:  None in previous 6 month   Objective:  Lab Results  Component Value Date   CREATININE 1.53 (H) 06/19/2022   BUN 36 (H) 06/19/2022   EGFR 36 (L) 06/19/2022   GFRNONAA 34 (L) 11/14/2020   GFRAA 45 (L) 06/21/2020   NA 139 06/19/2022   K 4.5 06/19/2022   CALCIUM 9.7 06/19/2022   CO2 24 06/19/2022   GLUCOSE 132 (H) 06/19/2022    Lab Results  Component Value Date/Time   HGBA1C 6.9 (H) 06/19/2022 09:22 AM   HGBA1C 6.7 (H) 03/18/2022 09:16 AM   MICROALBUR 8.5 08/08/2021 12:00 AM   MICROALBUR 30 10/10/2020 12:38 PM   MICROALBUR  30 10/05/2019 12:07 PM    Last diabetic Eye exam:  Lab Results  Component Value Date/Time   HMDIABEYEEXA No Retinopathy 12/17/2021 12:00 AM    Last diabetic Foot exam: No results found for: "HMDIABFOOTEX"   Lab Results  Component Value Date   CHOL 135 10/21/2021   HDL 45 10/21/2021   LDLCALC 66 10/21/2021   TRIG 137 10/21/2021   CHOLHDL 3.0 10/21/2021       Latest Ref Rng & Units 06/19/2022    9:22 AM 03/18/2022    9:16 AM 10/21/2021    9:59 AM  Hepatic Function  Total Protein 6.0 - 8.5 g/dL 7.4  7.0  7.5   Albumin 3.8 - 4.8 g/dL  4.1  4.2   AST 0 - 40 IU/L  15  14   ALT 0 - 32 IU/L  12  12   Alk Phosphatase 44 - 121 IU/L  105  109   Total Bilirubin 0.0 - 1.2 mg/dL  0.2  0.2     Lab Results  Component Value Date/Time   TSH 1.290 10/21/2021 09:59 AM   TSH 1.110 06/21/2020 11:37 AM       Latest Ref Rng & Units 10/21/2021    9:59 AM 11/14/2020   10:04 AM 06/21/2020  11:37 AM  CBC  WBC 3.4 - 10.8 x10E3/uL 8.7  10.1  10.5   Hemoglobin 11.1 - 15.9 g/dL 16.1  09.6  04.5   Hematocrit 34.0 - 46.6 % 37.9  37.7  36.8   Platelets 150 - 450 x10E3/uL 369  300  333     Lab Results  Component Value Date/Time   VD25OH 39.8 02/12/2021 09:10 AM   VITAMINB12 787 10/21/2021 09:59 AM    Clinical ASCVD: No  The ASCVD Risk score (Arnett DK, et al., 2019) failed to calculate for the following reasons:   The systolic blood pressure is missing        07/23/2022    8:48 AM 06/19/2022    8:26 AM 07/03/2021    9:16 AM  Depression screen PHQ 2/9  Decreased Interest 0 0 0  Down, Depressed, Hopeless 0 0 0  PHQ - 2 Score 0 0 0     Social History   Tobacco Use  Smoking Status Never  Smokeless Tobacco Never   BP Readings from Last 3 Encounters:  07/17/22 125/60  06/19/22 135/68  03/18/22 124/80   Pulse Readings from Last 3 Encounters:  07/17/22 71  06/19/22 71  03/18/22 78   Wt Readings from Last 3 Encounters:  07/23/22 254 lb (115.2 kg)  07/17/22 259 lb (117.5 kg)   06/19/22 250 lb (113.4 kg)   BMI Readings from Last 3 Encounters:  07/23/22 43.60 kg/m  07/17/22 47.37 kg/m  06/19/22 45.73 kg/m    Allergies  Allergen Reactions   Other Other (See Comments)    NO BLOOD   . Jehovah witness    Medications Reviewed Today     Reviewed by Barb Merino, LPN (Licensed Practical Nurse) on 07/23/22 at 0845  Med List Status: <None>   Medication Order Taking? Sig Documenting Provider Last Dose Status Informant  ACCU-CHEK AVIVA PLUS test strip [id number] Yes USE TO CHECK BLOOD SUGAR 4 TIMES A Sharyon Medicus, MD Taking Active   acetaminophen (TYLENOL) 500 MG tablet [id number] Yes Take 500 mg by mouth every 6 (six) hours as needed for moderate pain. [provider] Taking Active   albuterol (PROVENTIL HFA;VENTOLIN HFA) 108 (90 Base) MCG/ACT inhaler [id number] Yes Inhale 2 puffs into the lungs every 4 (four) hours as needed for wheezing or shortness of breath.  [provider] Taking Active Self  carvedilol (COREG) 25 MG tablet [id number] Yes TAKE 1 AND 1/2 TABLETS BY MOUTH  TWICE DAILY Yates Decamp, MD Taking Active   Cholecalciferol (VITAMIN D) 50 MCG (2000 UT) tablet [id number] Yes Take 2,000 Units by mouth daily. [provider] Taking Active   Chromium 1000 MCG TABS [id number] Yes Take 1,000 mcg by mouth daily.  [provider] Taking Active   Cinnamon 500 MG capsule [id number] Yes Take 500 mg by mouth daily. [provider] Taking Active Self           Med Note Valrie Hart, APRIL   Thu Oct 21, 2018  8:17 AM)    estradiol (ESTRACE) 0.1 MG/GM vaginal cream [id number] Yes Place vaginally. [provider] Taking Active   fluticasone (FLONASE) 50 MCG/ACT nasal spray [id number]  Place 1 spray into both nostrils daily.  Patient taking differently: Place 1 spray into both nostrils as needed for allergies.   [name], MD  Expired 07/17/22 2359   furosemide (LASIX) 40 MG tablet [id number] Yes TAKE 1 TABLET BY  MOUTH EVERY DAY IN THE MORNING Yates Decamp, MD Taking Active  hydrALAZINE (APRESOLINE) 50 MG tablet [id number] Yes TAKE 1 TABLET BY MOUTH TWICE  DAILY Yates Decamp, MD Taking Active   Insulin Lispro Prot & Lispro (HUMALOG MIX 75/25 KWIKPEN) (75-25) 100 UNIT/ML Stephanie Coup [id number] Yes Inject 55 Units into the skin at bedtime. [name], MD Taking Active   nystatin (NYSTATIN) powder [id number] Yes Apply topically 3 (three) times daily as needed. As needed [name], MD Taking Active   Omega-3 Fatty Acids (FISH OIL) 500 MG CAPS [id number] Yes Take 500 mg by mouth daily. [provider] Taking Active   omeprazole (PRILOSEC) 40 MG capsule [id number] Yes Take 1 capsule (40 mg total) by mouth daily before breakfast. Yates Decamp, MD Taking Active   pravastatin (PRAVACHOL) 40 MG tablet [id number] Yes TAKE 1 TABLET BY MOUTH  DAILY [name], MD Taking Active   Probiotic Product (PROBIOTIC PO) [id number] Yes Take by mouth. [provider] Taking Active   pyridOXINE (VITAMIN B6) 25 MG tablet [id number] Yes Take 25 mg by mouth daily. [provider] Taking Active   Semaglutide,0.25 or 0.5MG /DOS, (OZEMPIC, 0.25 OR 0.5 MG/DOSE,) 2 MG/1.5ML SOPN [id number] Yes Inject 0.5 mg into the skin once a week. [name], MD Taking Active   valsartan-hydrochlorothiazide (DIOVAN-HCT) 160-25 MG tablet [id number] Yes TAKE 1 TABLET BY MOUTH  DAILY [name], MD Taking Active   vitamin B-12 (CYANOCOBALAMIN) 100 MCG tablet [id number] Yes Take 100 mcg by mouth daily. [provider] Taking Active   VYZULTA 0.024 % SOLN [id number] Yes Apply 1-2 drops to eye daily. [provider] Taking Active             SDOH:  (Social Determinants of Health) assessments and interventions performed: Yes SDOH Interventions    Flowsheet Row Clinical Support from 07/23/2022 in [facility] Care Coordination from 07/22/2022 in [facility] Chronic  Care Management from 08/08/2020 in [facility] Chronic Care Management from 04/25/2020 in [facility] Chronic Care Management from 11/15/2019 in [facility] Clinical Support from 06/16/2019 in [facility]  SDOH Interventions        Food Insecurity Interventions Intervention Not Indicated -- -- -- -- --  Housing Interventions Intervention Not Indicated -- -- -- -- --  Transportation Interventions Intervention Not Indicated -- -- -- -- --  Depression Interventions/Treatment  -- -- -- -- -- PHQ2-9 Score <4 Follow-up Not Indicated  Financial Strain Interventions Intervention Not Indicated Other (Comment)  [Patient assistance for medication] --  [patient assistance application] --  [patient assistance for medication] Other (Comment)  [Assisted with coordination of Humalog 75/25 delivery from [name] P[facility] via [facility]] --  Physical Activity Interventions Intervention Not Indicated -- -- -- -- --  Stress Interventions Intervention Not Indicated -- -- -- -- --       Medication Assistance:  Ozempic obtained through [employer] medication assistance program.  Enrollment ends 04/2023  Medication Access: Within the past 30 days, how often has patient missed a dose of medication? No Is a pillbox or other method used to improve adherence? No  Factors that may affect medication adherence? financial need, adverse effects of medications, and lack of understanding of disease management Are meds synced by current pharmacy? No  Are meds delivered by current pharmacy? No  Does patient experience delays in picking up medications due to transportation concerns? No   [facility] Reviewed: Is patient disadvantaged to use [facility]?: Yes  Current Rx insurance plan: [facility] Medicare Name and location of Current pharmacy:  [facility]  Delivery) - [place], [place] - [address] [facility] [address] [place] [address] [place] [address] Phone: [phone] Fax: [phone]  [id number] - [place], [place] - [address] AT [address]OSS [address] [place] [address] Phone: [phone] Fax: [phone]  [facility] - [place], [place] - [address] [place] [address] Phone: [phone] Fax: [phone]  [facility] services reviewed with patient today?: No  Patient requests to transfer care to [facility]?:  Not applicable Reason patient declined to change pharmacies: Disadvantaged due to insurance/mail order  Compliance/Adherence/Medication fill history: Care Gaps: Annual wellness visit in last year? Yes   If Diabetic: Last eye exam / retinopathy screening: 12-17-2021 Last diabetic foot exam: None  [facility] Drugs: Pravastatin 40 mg- Last filled 07-30-2022 100 DS [facility]. Previous 05-21-2022 90 DS Valsartan/HCTZ 160-25 mg- Last filled 07-30-2022 100 DS [facility]. Previous 05-21-2022 90 DS   Assessment/Plan   Diabetes (A1c goal <7%) -Controlled -Current medications: Humalog Mix 75/25 inject 55 units into the skin at bedtime Appropriate, Effective, Safe, Accessible Ozempic 1 mg - inject 1 mg once per week Appropriate, Effective, Safe, Accessible -  patient received four boxes of Ozempic through patient assistance  Patient reports she is still using 0.5 mg sample that we gave her.  -Current home glucose readings fasting glucose: She reports her blood sugar readings have been the same around 120-90, she reports having one low reading - of 66 for the past 3 or 4 weeks  -Reports hypoglycemic/hyperglycemic symptoms - she recalls feeling weird in the morning  -Current meal patterns:  breakfast: boiled egg and apple   dinner:  cabbage, candied yams- 2 slices and she used stevia, and salmon   drinks: she is  trying to drink at least 4 -16 ounce bottles of water  -Current exercise: we will discuss further during her next visit.  -Educated on Prevention and management of hypoglycemic episodes; Benefits of routine self-monitoring of blood sugar; -She is also using stevia for her desserts and she is eating very little carbs.  -I congratulated Ms. Deshmukh on her weight loss of 6 lbs  -Patient reports that she is not as hungry but she is not having any side effects that have prevented her from .  -Counseled to check feet daily and get yearly eye exams -Recommended to continue current medication  Cherylin Mylar, CPP, PharmD Clinical Pharmacist Practitioner [facility] [phone]

## 2022-09-10 ENCOUNTER — Other Ambulatory Visit: Payer: Self-pay | Admitting: Cardiology

## 2022-09-10 DIAGNOSIS — K219 Gastro-esophageal reflux disease without esophagitis: Secondary | ICD-10-CM

## 2022-09-16 ENCOUNTER — Telehealth: Payer: Self-pay

## 2022-09-16 NOTE — Progress Notes (Signed)
As of 09/02/2022 patient picked up 4 boxes of Ozempic 1 mg from PCP office. Novo Nordisk patient assistance.    Billee Cashing, CMA Clinical Pharmacist Assistant [phone]

## 2022-09-23 ENCOUNTER — Telehealth: Payer: Self-pay

## 2022-09-23 MED ORDER — ACCU-CHEK AVIVA PLUS W/DEVICE KIT: PACK | 0 refills | Status: DC

## 2022-09-23 NOTE — Telephone Encounter (Signed)
Patient also needs assistance with a new blood glucose device, she reports having access to her test strips and lancets.   Patient is requesting help with reviewing different types of glucose meters and what her choices should be. Collaborated with patient to begin process of ordering CGM device. Will collaborate with PCP to work order device through [employer].   Cherylin Mylar, CPP, PharmD Clinical Pharmacist Practitioner [facility] [phone]

## 2022-10-15 DIAGNOSIS — I5022 Chronic systolic (congestive) heart failure: Secondary | ICD-10-CM | POA: Diagnosis not present

## 2022-10-15 DIAGNOSIS — Z4502 Encounter for adjustment and management of automatic implantable cardiac defibrillator: Secondary | ICD-10-CM | POA: Diagnosis not present

## 2022-10-15 DIAGNOSIS — Z95810 Presence of automatic (implantable) cardiac defibrillator: Secondary | ICD-10-CM | POA: Diagnosis not present

## 2022-10-30 ENCOUNTER — Ambulatory Visit (INDEPENDENT_AMBULATORY_CARE_PROVIDER_SITE_OTHER): Payer: Medicare Other | Admitting: Internal Medicine

## 2022-10-30 ENCOUNTER — Encounter: Payer: Self-pay | Admitting: Internal Medicine

## 2022-10-30 VITALS — BP 132/80 | HR 71 | Temp 98.3°F | Ht 64.0 in | Wt 250.4 lb

## 2022-10-30 DIAGNOSIS — Z0001 Encounter for general adult medical examination with abnormal findings: Secondary | ICD-10-CM | POA: Diagnosis not present

## 2022-10-30 DIAGNOSIS — E279 Disorder of adrenal gland, unspecified: Secondary | ICD-10-CM

## 2022-10-30 DIAGNOSIS — R911 Solitary pulmonary nodule: Secondary | ICD-10-CM

## 2022-10-30 DIAGNOSIS — E2839 Other primary ovarian failure: Secondary | ICD-10-CM

## 2022-10-30 DIAGNOSIS — N390 Urinary tract infection, site not specified: Secondary | ICD-10-CM | POA: Diagnosis not present

## 2022-10-30 DIAGNOSIS — E1122 Type 2 diabetes mellitus with diabetic chronic kidney disease: Secondary | ICD-10-CM

## 2022-10-30 DIAGNOSIS — I130 Hypertensive heart and chronic kidney disease with heart failure and stage 1 through stage 4 chronic kidney disease, or unspecified chronic kidney disease: Secondary | ICD-10-CM

## 2022-10-30 DIAGNOSIS — R319 Hematuria, unspecified: Secondary | ICD-10-CM

## 2022-10-30 DIAGNOSIS — I5032 Chronic diastolic (congestive) heart failure: Secondary | ICD-10-CM | POA: Diagnosis not present

## 2022-10-30 DIAGNOSIS — E278 Other specified disorders of adrenal gland: Secondary | ICD-10-CM | POA: Diagnosis not present

## 2022-10-30 DIAGNOSIS — Z794 Long term (current) use of insulin: Secondary | ICD-10-CM | POA: Diagnosis not present

## 2022-10-30 DIAGNOSIS — E7800 Pure hypercholesterolemia, unspecified: Secondary | ICD-10-CM

## 2022-10-30 DIAGNOSIS — I700 Atherosclerosis of aorta: Secondary | ICD-10-CM | POA: Diagnosis not present

## 2022-10-30 DIAGNOSIS — Z87891 Personal history of nicotine dependence: Secondary | ICD-10-CM

## 2022-10-30 DIAGNOSIS — Z0000 Encounter for general adult medical examination without abnormal findings: Secondary | ICD-10-CM

## 2022-10-30 DIAGNOSIS — N1832 Chronic kidney disease, stage 3b: Secondary | ICD-10-CM

## 2022-10-30 DIAGNOSIS — Z6841 Body Mass Index (BMI) 40.0 and over, adult: Secondary | ICD-10-CM

## 2022-10-30 LAB — POCT URINALYSIS DIPSTICK
Bilirubin, UA: NEGATIVE
Glucose, UA: NEGATIVE
Ketones, UA: NEGATIVE
Nitrite, UA: NEGATIVE
Protein, UA: NEGATIVE
Spec Grav, UA: 1.02 (ref 1.010–1.025)
Urobilinogen, UA: 0.2 E.U./dL
pH, UA: 7 (ref 5.0–8.0)

## 2022-10-30 MED ORDER — INSULIN LISPRO PROT & LISPRO (75-25 MIX) 100 UNIT/ML KWIKPEN: PEN_INJECTOR | SUBCUTANEOUS | 0 refills | Status: DC

## 2022-10-30 NOTE — Patient Instructions (Addendum)
Ceylon cinnamon  Health Maintenance, Female Adopting a healthy lifestyle and getting preventive care are important in promoting health and wellness. Ask your health care provider about: The right schedule for you to have regular tests and exams. Things you can do on your own to prevent diseases and keep yourself healthy. What should I know about diet, weight, and exercise? Eat a healthy diet  Eat a diet that includes plenty of vegetables, fruits, low-fat dairy products, and lean protein. Do not eat a lot of foods that are high in solid fats, added sugars, or sodium. Maintain a healthy weight Body mass index (BMI) is used to identify weight problems. It estimates body fat based on height and weight. Your health care provider can help determine your BMI and help you achieve or maintain a healthy weight. Get regular exercise Get regular exercise. This is one of the most important things you can do for your health. Most adults should: Exercise for at least 150 minutes each week. The exercise should increase your heart rate and make you sweat (moderate-intensity exercise). Do strengthening exercises at least twice a week. This is in addition to the moderate-intensity exercise. Spend less time sitting. Even light physical activity can be beneficial. Watch cholesterol and blood lipids Have your blood tested for lipids and cholesterol at 73 years of age, then have this test every 5 years. Have your cholesterol levels checked more often if: Your lipid or cholesterol levels are high. You are older than 73 years of age. You are at high risk for heart disease. What should I know about cancer screening? Depending on your health history and family history, you may need to have cancer screening at various ages. This may include screening for: Breast cancer. Cervical cancer. Colorectal cancer. Skin cancer. Lung cancer. What should I know about heart disease, diabetes, and high blood pressure? Blood  pressure and heart disease High blood pressure causes heart disease and increases the risk of stroke. This is more likely to develop in people who have high blood pressure readings or are overweight. Have your blood pressure checked: Every 3-5 years if you are 18-39 years of age. Every year if you are 40 years old or older. Diabetes Have regular diabetes screenings. This checks your fasting blood sugar level. Have the screening done: Once every three years after age 40 if you are at a normal weight and have a low risk for diabetes. More often and at a younger age if you are overweight or have a high risk for diabetes. What should I know about preventing infection? Hepatitis B If you have a higher risk for hepatitis B, you should be screened for this virus. Talk with your health care provider to find out if you are at risk for hepatitis B infection. Hepatitis C Testing is recommended for: Everyone born from 1945 through 1965. Anyone with known risk factors for hepatitis C. Sexually transmitted infections (STIs) Get screened for STIs, including gonorrhea and chlamydia, if: You are sexually active and are younger than 73 years of age. You are older than 73 years of age and your health care provider tells you that you are at risk for this type of infection. Your sexual activity has changed since you were last screened, and you are at increased risk for chlamydia or gonorrhea. Ask your health care provider if you are at risk. Ask your health care provider about whether you are at high risk for HIV. Your health care provider may recommend a prescription medicine to   help prevent HIV infection. If you choose to take medicine to prevent HIV, you should first get tested for HIV. You should then be tested every 3 months for as long as you are taking the medicine. Pregnancy If you are about to stop having your period (premenopausal) and you may become pregnant, seek counseling before you get  pregnant. Take 400 to 800 micrograms (mcg) of folic acid every day if you become pregnant. Ask for birth control (contraception) if you want to prevent pregnancy. Osteoporosis and menopause Osteoporosis is a disease in which the bones lose minerals and strength with aging. This can result in bone fractures. If you are 65 years old or older, or if you are at risk for osteoporosis and fractures, ask your health care provider if you should: Be screened for bone loss. Take a calcium or vitamin D supplement to lower your risk of fractures. Be given hormone replacement therapy (HRT) to treat symptoms of menopause. Follow these instructions at home: Alcohol use Do not drink alcohol if: Your health care provider tells you not to drink. You are pregnant, may be pregnant, or are planning to become pregnant. If you drink alcohol: Limit how much you have to: 0-1 drink a day. Know how much alcohol is in your drink. In the [place] one drink equals one 12 oz bottle of beer (355 mL), one 5 oz glass of wine (148 mL), or one 1 oz glass of hard liquor (44 mL). Lifestyle Do not use any products that contain nicotine or tobacco. These products include cigarettes, chewing tobacco, and vaping devices, such as e-cigarettes. If you need help quitting, ask your health care provider. Do not use street drugs. Do not share needles. Ask your health care provider for help if you need support or information about quitting drugs. General instructions Schedule regular health, dental, and eye exams. Stay current with your vaccines. Tell your health care provider if: You often feel depressed. You have ever been abused or do not feel safe at home. Summary Adopting a healthy lifestyle and getting preventive care are important in promoting health and wellness. Follow your health care provider's instructions about healthy diet, exercising, and getting tested or screened for diseases. Follow your health care provider's  instructions on monitoring your cholesterol and blood pressure. This information is not intended to replace advice given to you by your health care provider. Make sure you discuss any questions you have with your health care provider. Document Revised: 09/10/2020 Document Reviewed: 09/10/2020 Elsevier Patient Education  2024 [employer]  

## 2022-10-30 NOTE — Progress Notes (Unsigned)
Subjective:    Patient ID: Yvonne Miller , female    DOB: [birth year] , 72 y.o.   MRN: [id number]  Chief Complaint  Patient presents with   Annual Exam   Hypertension   Diabetes   Hyperlipidemia    HPI  She is here today for a full physical examination. She is no longer followed by GYN. She has no specific concerns or complaints at this time. EKG completed on 07/17/2022. She reports visiting cardiologist, Dr Jacinto Halim every 3-4 months.   She would like to speak with Catie, she reports being on PA program. She was given Ozempic last month, discontinuing Trulicity.   Diabetes She presents for her follow-up diabetic visit. She has type 2 diabetes mellitus. Her disease course has been stable. There are no hypoglycemic associated symptoms. Pertinent negatives for diabetes include no blurred vision and no chest pain. There are no hypoglycemic complications. Diabetic complications include nephropathy. Risk factors for coronary artery disease include diabetes mellitus, dyslipidemia, hypertension, obesity and sedentary lifestyle. Current diabetic treatment includes insulin injections. She is compliant with treatment some of the time. She is following a generally healthy diet. She participates in exercise intermittently. Her breakfast blood glucose is taken between 8-9 am. Her breakfast blood glucose range is generally 110-130 mg/dl. An ACE inhibitor/angiotensin II receptor blocker is being taken.  Hypertension This is a chronic problem. The current episode started more than 1 year ago. The problem has been gradually improving since onset. The problem is controlled. Pertinent negatives include no blurred vision, chest pain, palpitations or shortness of breath. Past treatments include ACE inhibitors, angiotensin blockers and diuretics. The current treatment provides moderate improvement. Compliance problems include exercise.  Hypertensive end-organ damage includes kidney disease and heart failure. Identifiable  causes of hypertension include sleep apnea.     Past Medical History:  Diagnosis Date   AICD (automatic cardioverter/defibrillator) present    Arthritis    Asthma    Carpal tunnel syndrome, bilateral    CKD (chronic kidney disease)    Diabetes mellitus    Encounter for assessment of implantable cardioverter-defibrillator (ICD)    GERD (gastroesophageal reflux disease)    Hyperlipemia    Hypertension    Obesity    morbid   Sleep apnea    wears CPAP set at 12   Wears glasses      Family History  Problem Relation Age of Onset   Hypertension Mother    Stroke Mother    Heart disease Father    Heart attack Sister    Heart attack Brother      Current Outpatient Medications:    ACCU-CHEK AVIVA PLUS test strip, USE TO CHECK BLOOD SUGAR 4 TIMES A DAY, Disp: 200 strip, Rfl: 11   acetaminophen (TYLENOL) 500 MG tablet, Take 500 mg by mouth every 6 (six) hours as needed for moderate pain., Disp: , Rfl:    albuterol (PROVENTIL HFA;VENTOLIN HFA) 108 (90 Base) MCG/ACT inhaler, Inhale 2 puffs into the lungs every 4 (four) hours as needed for wheezing or shortness of breath. , Disp: , Rfl:    carvedilol (COREG) 25 MG tablet, TAKE 1 AND 1/2 TABLETS BY MOUTH  TWICE DAILY, Disp: 300 tablet, Rfl: 2   cephALEXin (KEFLEX) 500 MG capsule, One tab po twice daily, Disp: 14 capsule, Rfl: 0   Cholecalciferol (VITAMIN D) 50 MCG (2000 UT) tablet, Take 2,000 Units by mouth daily., Disp: , Rfl:    Chromium 1000 MCG TABS, Take 1,000 mcg by mouth daily. , Disp: ,  Rfl:    Cinnamon 500 MG capsule, Take 500 mg by mouth daily., Disp: , Rfl:    estradiol (ESTRACE) 0.1 MG/GM vaginal cream, Place vaginally., Disp: , Rfl:    furosemide (LASIX) 40 MG tablet, TAKE 1 TABLET BY MOUTH EVERY DAY IN THE MORNING, Disp: 90 tablet, Rfl: 0   hydrALAZINE (APRESOLINE) 50 MG tablet, TAKE 1 TABLET BY MOUTH TWICE  DAILY, Disp: 200 tablet, Rfl: 2   nystatin (NYSTATIN) powder, Apply topically 3 (three) times daily as needed. As  needed, Disp: 60 g, Rfl: 2   Omega-3 Fatty Acids (FISH OIL) 500 MG CAPS, Take 500 mg by mouth daily., Disp: , Rfl:    omeprazole (PRILOSEC) 40 MG capsule, TAKE 1 CAPSULE BY MOUTH DAILY  BEFORE BREAKFAST, Disp: 90 capsule, Rfl: 3   pravastatin (PRAVACHOL) 40 MG tablet, TAKE 1 TABLET BY MOUTH DAILY, Disp: 100 tablet, Rfl: 2   Probiotic Product (PROBIOTIC PO), Take by mouth., Disp: , Rfl:    pyridOXINE (VITAMIN B6) 25 MG tablet, Take 25 mg by mouth daily., Disp: , Rfl:    Semaglutide, 1 MG/DOSE, (OZEMPIC, 1 MG/DOSE,) 2 MG/1.5ML SOPN, Inject 1 mg into the skin once a week., Disp: 3 mL, Rfl: 12   valsartan-hydrochlorothiazide (DIOVAN-HCT) 160-25 MG tablet, TAKE 1 TABLET BY MOUTH DAILY, Disp: 100 tablet, Rfl: 2   vitamin B-12 (CYANOCOBALAMIN) 100 MCG tablet, Take 100 mcg by mouth daily., Disp: , Rfl:    VYZULTA 0.024 % SOLN, Apply 1-2 drops to eye daily., Disp: , Rfl:    Blood Glucose Monitoring Suppl (ACCU-CHEK GUIDE ME) w/Device KIT, USE TO CHECK BLOOD SUGAR AS DIRECTED BY THE PCP TEAM, Disp: 2 kit, Rfl: 2   fluticasone (FLONASE) 50 MCG/ACT nasal spray, Place 1 spray into both nostrils daily. (Patient taking differently: Place 1 spray into both nostrils as needed for allergies.), Disp: 16 g, Rfl: 2   Insulin Lispro Prot & Lispro (HUMALOG MIX 75/25 KWIKPEN) (75-25) 100 UNIT/ML Kwikpen, Inject 55 units before dinner, Disp: 9 mL, Rfl: 0   Allergies  Allergen Reactions   Other Other (See Comments)    NO BLOOD   . Jehovah witness      The patient states she uses post menopausal status for birth control. No LMP recorded. Patient has had a hysterectomy.. Negative for Dysmenorrhea. Negative for: breast discharge, breast lump(s), breast pain and breast self exam. Associated symptoms include abnormal vaginal bleeding. Pertinent negatives include abnormal bleeding (hematology), anxiety, decreased libido, depression, difficulty falling sleep, dyspareunia, history of infertility, nocturia, sexual dysfunction,  sleep disturbances, urinary incontinence, urinary urgency, vaginal discharge and vaginal itching. Diet regular.The patient states her exercise level is    . The patient's tobacco use is:  Social History   Tobacco Use  Smoking Status Former   Packs/day: 0.50   Years: 0.50   Additional pack years: 0.00   Total pack years: 0.25   Types: Cigarettes  Smokeless Tobacco Never  Tobacco Comments   She smoked while in college.   . She has been exposed to passive smoke. The patient's alcohol use is:  Social History   Substance and Sexual Activity  Alcohol Use No    Review of Systems  Constitutional: Negative.   HENT: Negative.    Eyes: Negative.  Negative for blurred vision.  Respiratory: Negative.  Negative for shortness of breath.   Cardiovascular: Negative.  Negative for chest pain and palpitations.  Gastrointestinal: Negative.   Endocrine: Negative.   Genitourinary: Negative.   Musculoskeletal: Negative.  Skin: Negative.   Allergic/Immunologic: Negative.   Neurological: Negative.   Hematological: Negative.   Psychiatric/Behavioral: Negative.       Today's Vitals   10/30/22 0829  BP: 132/80  Pulse: 71  Temp: 98.3 F (36.8 C)  SpO2: 98%  Weight: 250 lb 6.4 oz (113.6 kg)  Height: 5\' 4"  (1.626 m)   Body mass index is 42.98 kg/m.  Wt Readings from Last 3 Encounters:  10/30/22 250 lb 6.4 oz (113.6 kg)  07/23/22 254 lb (115.2 kg)  07/17/22 259 lb (117.5 kg)     Objective:  Physical Exam Vitals and nursing note reviewed.  Constitutional:      Appearance: Normal appearance. She is obese.  HENT:     Head: Normocephalic and atraumatic.     Right Ear: Tympanic membrane, ear canal and external ear normal.     Left Ear: Tympanic membrane, ear canal and external ear normal.     Nose: Nose normal.     Mouth/Throat:     Mouth: Mucous membranes are moist.     Pharynx: Oropharynx is clear.  Eyes:     Extraocular Movements: Extraocular movements intact.      Conjunctiva/sclera: Conjunctivae normal.     Pupils: Pupils are equal, round, and reactive to light.  Cardiovascular:     Rate and Rhythm: Normal rate and regular rhythm.     Pulses: Normal pulses.     Heart sounds: Normal heart sounds.  Pulmonary:     Effort: Pulmonary effort is normal.     Breath sounds: Normal breath sounds.  Chest:  Breasts:    Tanner Score is 5.     Right: Normal.     Left: Normal.     Comments: Pendulous Abdominal:     General: Bowel sounds are normal.     Palpations: Abdomen is soft.     Comments: Obese, soft. Difficult to assess organomegaly  Genitourinary:    Comments: deferred Musculoskeletal:        General: Normal range of motion.     Cervical back: Normal range of motion and neck supple.  Feet:     Right foot:     Protective Sensation: 5 sites tested.  5 sites sensed.     Skin integrity: Dry skin present.     Toenail Condition: Right toenails are abnormally thick.     Left foot:     Protective Sensation: 5 sites tested.  5 sites sensed.     Skin integrity: Dry skin present.     Toenail Condition: Left toenails are abnormally thick.  Skin:    General: Skin is warm and dry.  Neurological:     General: No focal deficit present.     Mental Status: She is alert and oriented to person, place, and time.  Psychiatric:        Mood and Affect: Mood normal.        Behavior: Behavior normal.         Assessment And Plan:     1. Routine general medical examination at health care facility Comments: A full exam was performed.  Importance of monthly self breast exams was discussed with the patient.  PATIENT IS ADVISED TO GET 30-45 MINUTES REGULAR EXERCISE NO LESS THAN FOUR TO FIVE DAYS PER WEEK - BOTH WEIGHTBEARING EXERCISES AND AEROBIC ARE RECOMMENDED.  PATIENT IS ADVISED TO FOLLOW A HEALTHY DIET WITH AT LEAST SIX FRUITS/VEGGIES PER DAY, DECREASE INTAKE OF RED MEAT, AND TO INCREASE FISH INTAKE TO TWO DAYS  PER WEEK.  MEATS/FISH SHOULD NOT BE FRIED, BAKED  OR BROILED IS PREFERABLE.  IT IS ALSO IMPORTANT TO CUT BACK ON YOUR SUGAR INTAKE. PLEASE AVOID ANYTHING WITH ADDED SUGAR, CORN SYRUP OR OTHER SWEETENERS. IF YOU MUST USE A SWEETENER, YOU CAN TRY STEVIA. IT IS ALSO IMPORTANT TO AVOID ARTIFICIALLY SWEETENERS AND DIET BEVERAGES. LASTLY, I SUGGEST WEARING SPF 50 SUNSCREEN ON EXPOSED PARTS AND ESPECIALLY WHEN IN THE DIRECT SUNLIGHT FOR AN EXTENDED PERIOD OF TIME.  PLEASE AVOID FAST FOOD RESTAURANTS AND INCREASE YOUR WATER INTAKE.  2. Hypertensive heart and renal disease with renal failure, stage 1 through stage 4 or unspecified chronic kidney disease, with heart failure (HCC) Comments: Chronic, fair control. She is aware goal BP<130/80. March EKG reviewed. She will c/w carvedilol 25mg  1-1/2 tabs twice daily, furosemide 40mg  daily, hydralazine 50mg  twice daily,  and valsartan/hct 160/25mg  daily.  She is encouraged to limit sodium and aim for at least 150 minutes of exercise/week. - Microalbumin / Creatinine Urine Ratio - POCT Urinalysis Dipstick (81002) - CMP14+EGFR  3. Chronic diastolic heart failure (HCC) Comments: Chronic, appears euvolemic.    4. Type 2 diabetes mellitus with stage 3b chronic kidney disease, with long-term current use of insulin (HCC) Comments: Chronic, diabetic foot exam was performed.  Advised to take Humalog 75/25 55 units prior to DINNER, not at bedtime.  I DISCUSSED WITH THE PATIENT AT LENGTH REGARDING THE GOALS OF GLYCEMIC CONTROL AND POSSIBLE LONG-TERM COMPLICATIONS.  I  ALSO STRESSED THE IMPORTANCE OF COMPLIANCE WITH HOME GLUCOSE MONITORING, DIETARY RESTRICTIONS INCLUDING AVOIDANCE OF SUGARY DRINKS/PROCESSED FOODS,  ALONG WITH REGULAR EXERCISE.  I  ALSO STRESSED THE IMPORTANCE OF ANNUAL EYE EXAMS, SELF FOOT CARE AND COMPLIANCE WITH OFFICE VISITS.  - Microalbumin / Creatinine Urine Ratio - POCT Urinalysis Dipstick (81002) - CBC - CMP14+EGFR - Lipid panel - Hemoglobin A1c - Insulin Lispro Prot & Lispro (HUMALOG MIX 75/25  KWIKPEN) (75-25) 100 UNIT/ML Kwikpen; Inject 55 units before dinner  Dispense: 9 mL; Refill: 0  5. Pure hypercholesterolemia Comments: Chronic, LDL goal < 70.   She will c/w pravastatin 40mg  daily. Advised to follow heart healthy lifestyle. - Lipid panel  6. Estrogen deficiency Comments: She agrees to repeat bone density. She is encouraged to engage in weight-bearing exercises at least three days per week. - DG Bone Density; Future  7. Aortic atherosclerosis (HCC) Comments: Chronic, LDL goal < 70. She will c/w statin therapy.  She is encouraged to follow a heart healthy lifestyle.  8. Lung nodule seen on imaging study Comments: She agrees to CT chest w/o contrast for follow-up.  9. Urinary tract infection with hematuria, site unspecified Comments: Urinalysis is suggestive of UTI. I will send culture to determine best treatment due to h/o recurrent UTI. - Culture, Urine  10. Adrenal nodule [facility]) Comments: CT adrenal from June 2021 reviewed.  Study suggests both left/right adrenal nodules are likely benign.  11. Class 3 severe obesity due to excess calories with serious comorbidity and body mass index (BMI) of 40.0 to 44.9 in adult [facility]) Comments: BMI 42.  She is encouraged to strive for BMI less than 30 to decrease cardiac risk. Advised to aim for at least 150 minutes of exercise per week.  12. History of tobacco use Comments: She does not qualify for LDCT. She has had 4mm lung nodule noted on previous studies. I will check CT chest w/o contrast. - CT Chest Wo Contrast; Future    Return for 1 year physical, 4 month DM. Patient was  given opportunity to ask questions. Patient verbalized understanding of the plan and was able to repeat key elements of the plan. All questions were answered to their satisfaction.    I, [name], MD, have reviewed all documentation for this visit. The documentation on 10/30/22 for the exam, diagnosis, procedures, and orders are all accurate and  complete.

## 2022-10-31 LAB — LIPID PANEL
Chol/HDL Ratio: 3.1 ratio (ref 0.0–4.4)
Cholesterol, Total: 155 mg/dL (ref 100–199)
HDL: 50 mg/dL (ref >39)
LDL Chol Calc (NIH): 76 mg/dL (ref 0–99)
Triglycerides: 170 mg/dL — ABNORMAL HIGH (ref 0–149)
VLDL Cholesterol Cal: 29 mg/dL (ref 5–40)

## 2022-10-31 LAB — CMP14+EGFR
ALT: 17 IU/L (ref 0–32)
AST: 17 IU/L (ref 0–40)
Albumin: 4.3 g/dL (ref 3.8–4.8)
Alkaline Phosphatase: 93 IU/L (ref 44–121)
BUN/Creatinine Ratio: 22 (ref 12–28)
BUN: 30 mg/dL — ABNORMAL HIGH (ref 8–27)
Bilirubin Total: 0.3 mg/dL (ref 0.0–1.2)
CO2: 26 mmol/L (ref 20–29)
Calcium: 10.1 mg/dL (ref 8.7–10.3)
Chloride: 99 mmol/L (ref 96–106)
Creatinine, Ser: 1.35 mg/dL — ABNORMAL HIGH (ref 0.57–1.00)
Globulin, Total: 2.8 g/dL (ref 1.5–4.5)
Glucose: 97 mg/dL (ref 70–99)
Potassium: 4.4 mmol/L (ref 3.5–5.2)
Sodium: 138 mmol/L (ref 134–144)
Total Protein: 7.1 g/dL (ref 6.0–8.5)
eGFR: 41 mL/min/{1.73_m2} — ABNORMAL LOW (ref >59)

## 2022-10-31 LAB — CBC
Hematocrit: 37.9 % (ref 34.0–46.6)
Hemoglobin: 12.3 g/dL (ref 11.1–15.9)
MCH: 25.5 pg — ABNORMAL LOW (ref 26.6–33.0)
MCHC: 32.5 g/dL (ref 31.5–35.7)
MCV: 79 fL (ref 79–97)
Platelets: 340 10*3/uL (ref 150–450)
RBC: 4.82 x10E6/uL (ref 3.77–5.28)
RDW: 14.3 % (ref 11.7–15.4)
WBC: 9.5 10*3/uL (ref 3.4–10.8)

## 2022-10-31 LAB — HEMOGLOBIN A1C
Est. average glucose Bld gHb Est-mCnc: 151 mg/dL
Hgb A1c MFr Bld: 6.9 % — ABNORMAL HIGH (ref 4.8–5.6)

## 2022-10-31 LAB — MICROALBUMIN / CREATININE URINE RATIO
Creatinine, Urine: 71.5 mg/dL
Microalb/Creat Ratio: 29 mg/g creat (ref 0–29)
Microalbumin, Urine: 20.6 ug/mL

## 2022-11-01 ENCOUNTER — Other Ambulatory Visit: Payer: Self-pay | Admitting: Internal Medicine

## 2022-11-03 HISTORY — PX: OTHER SURGICAL HISTORY: SHX169

## 2022-11-03 LAB — URINE CULTURE

## 2022-11-04 ENCOUNTER — Encounter: Payer: Self-pay | Admitting: Pharmacist

## 2022-11-04 ENCOUNTER — Other Ambulatory Visit: Payer: Self-pay | Admitting: Internal Medicine

## 2022-11-04 MED ORDER — CEPHALEXIN 500 MG PO CAPS: ORAL_CAPSULE | ORAL | 0 refills | Status: DC

## 2022-11-04 NOTE — Progress Notes (Signed)
Patient previously followed by [facility] pharmacist. Per clinical review, no pharmacist appointment needed at this time. Will make pharmacy patient advocate team aware of Ozempic enrollment for re-enrollment for 2025. Care guide directed to contact patient and cancel appointment and notify pharmacy team of any patient concerns.

## 2022-11-08 ENCOUNTER — Encounter: Payer: Self-pay | Admitting: Internal Medicine

## 2022-11-10 ENCOUNTER — Other Ambulatory Visit: Payer: Self-pay | Admitting: Cardiology

## 2022-11-10 ENCOUNTER — Telehealth (HOSPITAL_BASED_OUTPATIENT_CLINIC_OR_DEPARTMENT_OTHER): Payer: Self-pay

## 2022-11-10 DIAGNOSIS — I5042 Chronic combined systolic (congestive) and diastolic (congestive) heart failure: Secondary | ICD-10-CM

## 2022-11-17 DIAGNOSIS — M65342 Trigger finger, left ring finger: Secondary | ICD-10-CM | POA: Diagnosis not present

## 2022-11-25 ENCOUNTER — Telehealth: Payer: Self-pay

## 2022-11-25 NOTE — Telephone Encounter (Signed)
RECEIVED PROVIDER PAGES FOR AUTO-REFILL REODER AND Submitted application for OZEMPIC to [employer] for patient assistance PROCESSING.   Phone: [phone]  PLEASE BE ADVISED  Melanee Spry CPhT Rx Patient Advocate [phone]

## 2022-11-27 NOTE — Telephone Encounter (Signed)
Received notification from [employer] regarding approval for OZEMPIC 1 MG. Patient assistance approved from  11/26/2022 to 05/05/2023.  Phone: [phone]  PLEASE BE ADVISED PT CAN CALL NUMBER ABOVE TO CHECK THE STATUS ON SHIPPING.

## 2022-12-08 ENCOUNTER — Ambulatory Visit (HOSPITAL_BASED_OUTPATIENT_CLINIC_OR_DEPARTMENT_OTHER)
Admission: RE | Admit: 2022-12-08 | Discharge: 2022-12-08 | Disposition: A | Discharge status: Home / Self Care | Payer: Medicare Other | Source: Ambulatory Visit | Attending: Internal Medicine | Admitting: Internal Medicine

## 2022-12-08 DIAGNOSIS — Z87891 Personal history of nicotine dependence: Secondary | ICD-10-CM | POA: Diagnosis not present

## 2022-12-08 DIAGNOSIS — J479 Bronchiectasis, uncomplicated: Secondary | ICD-10-CM | POA: Diagnosis not present

## 2022-12-08 DIAGNOSIS — R911 Solitary pulmonary nodule: Secondary | ICD-10-CM | POA: Diagnosis not present

## 2022-12-18 ENCOUNTER — Encounter: Payer: Self-pay | Admitting: Pharmacist

## 2022-12-18 ENCOUNTER — Other Ambulatory Visit: Payer: Self-pay | Admitting: Internal Medicine

## 2022-12-18 ENCOUNTER — Encounter: Payer: Self-pay | Admitting: Internal Medicine

## 2022-12-18 DIAGNOSIS — R9389 Abnormal findings on diagnostic imaging of other specified body structures: Secondary | ICD-10-CM

## 2022-12-19 ENCOUNTER — Telehealth: Payer: Self-pay

## 2022-12-19 NOTE — Telephone Encounter (Signed)
Patient sent my chart messages about possible UTI- patient called and encouraged to go to UTI since we don't have any availability until Monday. Patient reports she has an appt with her kidney dr sometimes this week and she will wait to see them- patient reports her symptoms aren't that bad she just has frequency and pressure.

## 2022-12-23 DIAGNOSIS — N1832 Chronic kidney disease, stage 3b: Secondary | ICD-10-CM | POA: Diagnosis not present

## 2022-12-23 DIAGNOSIS — I129 Hypertensive chronic kidney disease with stage 1 through stage 4 chronic kidney disease, or unspecified chronic kidney disease: Secondary | ICD-10-CM | POA: Diagnosis not present

## 2022-12-23 DIAGNOSIS — I509 Heart failure, unspecified: Secondary | ICD-10-CM | POA: Diagnosis not present

## 2022-12-23 DIAGNOSIS — N390 Urinary tract infection, site not specified: Secondary | ICD-10-CM | POA: Diagnosis not present

## 2022-12-23 DIAGNOSIS — E1129 Type 2 diabetes mellitus with other diabetic kidney complication: Secondary | ICD-10-CM | POA: Diagnosis not present

## 2022-12-23 DIAGNOSIS — E1122 Type 2 diabetes mellitus with diabetic chronic kidney disease: Secondary | ICD-10-CM | POA: Diagnosis not present

## 2022-12-24 ENCOUNTER — Other Ambulatory Visit: Payer: Medicare Other | Admitting: Pharmacist

## 2022-12-24 NOTE — Patient Instructions (Signed)
Yvonne Miller,   It was great talking to you today!  Keep up the fantastic work. We will reach out later this year to re-enroll in patient assistance for 2025.  [name], PharmD, BCACP, CPP Clinical Pharmacist [facility] [phone]

## 2022-12-24 NOTE — Progress Notes (Signed)
12/24/2022 Name: Yvonne Miller MRN: [id number] DOB: 1951-01-15  Chief Complaint  Patient presents with   Medication Management   Diabetes    Yvonne Miller is a 72 y.o. year old female who presented for a telephone visit.   They were referred to the pharmacist by their PCP for assistance in managing diabetes and hyperlipidemia.    Subjective:  Care Team: Primary Care Provider: [name], MD ; Next Scheduled Visit: 07/07/23  Medication Access/Adherence  Current Pharmacy:  [facility] - [place], [place] - [address] [facility] [address] [place] [address] [place] [address] Phone: [phone] Fax: [phone]  [id number] - [place], [place] - [address] [place] [address] Phone: [phone] Fax: [phone]  [facility] - [place], [place] - [address] [place]  [address] Phone: [phone] Fax: [phone]  [facility] [address] [place] - [address] [place] [address] Phone: [phone] Fax: [phone]   Patient reports affordability concerns with their medications: No  Patient reports access/transportation concerns to their pharmacy: No  Patient reports adherence concerns with their medications:  No    Reports recent symptoms of urinary infection. Was unable to get in to see Dr. Allyne Gee. Notes she has been taking natural supplements to help with pain. Had symptoms for ~ 2 weeks. Received a call this morning from nephrology that she had a urinary tract infection.   Diabetes:  Current medications: Ozempic 1 mg weekly, Humalog Mix 75/25 - sliding scale with breakfast, 55 units daily  Medications tried in the past: metformin - discontinued due to being on for a long time; renal function borderline for re-initiation  Denies GI upset, nausea, constipation. 2  recent UTIs. Would avoid SGLT2 given recent.   Current glucose readings: fasting 90-120s; 2 hour post prandial ~180s  Reports she has been on 75/25 for several years and she knows her body well. Denies any episodes of low blood sugars.   Current meal patterns:  - Snacks: always eats a bedtime snack - peanut butter, crackers;   Current physical activity: walking 2-3 days weekly, 3 days of water aerobics (has paused due to recent infections)   Current medication access support: approved for Ozempic assistance for 2024  Saw nephrologist yesterday.   Heart Failure:  Current medications:  ACEi/ARB/ARNI: valsartan/hydrochlorothiazide 160/25 mg daily SGLT2i: none due to multiple recent UTIs Beta blocker: carvedilol 37.5 mg twice daily  Mineralocorticoid Receptor Antagonist: none Diuretic regimen: furosemide 40 mg daily; takes extra dose the next day with weight gain  Additional antihypertensive: hydralazine 50 mg twice daily  Current home blood pressure readings: 120s/80s; denies lightheadedness, dizziness, symptoms of hypotension  Hyperlipidemia/ASCVD Risk Reduction  Current lipid lowering medications: pravastatin 40 mg daily   Objective:  Lab Results  Component Value Date   HGBA1C 6.9 (H) 10/30/2022    Lab Results  Component Value Date   CREATININE 1.35 (H) 10/30/2022   BUN 30 (H) 10/30/2022   NA 138 10/30/2022   K 4.4 10/30/2022   CL 99 10/30/2022   CO2 26 10/30/2022    Lab Results  Component Value Date   CHOL 155 10/30/2022   HDL 50 10/30/2022   LDLCALC 76 10/30/2022   TRIG 170 (H) 10/30/2022   CHOLHDL 3.1 10/30/2022    Medications Reviewed Today     Reviewed by Clearance Coots,  Janace Litten, RPH-CPP (Pharmacist) on 12/24/22 at 1122  Med List Status: <None>   Medication Order Taking? Sig Documenting Provider Last Dose Status Informant  ACCU-CHEK AVIVA PLUS test strip [id number]  USE TO CHECK BLOOD SUGAR 4 TIMES A Sharyon Medicus, MD  Active   acetaminophen  (TYLENOL) 500 MG tablet [id number]  Take 500 mg by mouth every 6 (six) hours as needed for moderate pain. [provider]  Active   albuterol (PROVENTIL HFA;VENTOLIN HFA) 108 (90 Base) MCG/ACT inhaler [id number]  Inhale 2 puffs into the lungs every 4 (four) hours as needed for wheezing or shortness of breath.  [provider]  Active Self  Blood Glucose Monitoring Suppl (ACCU-CHEK GUIDE ME) w/Device KIT [id number]  USE TO CHECK BLOOD SUGAR AS DIRECTED BY THE PCP TEAM [name], MD  Active   carvedilol (COREG) 25 MG tablet [id number]  TAKE 1 AND 1/2 TABLETS BY MOUTH  TWICE DAILY Yates Decamp, MD  Active   cephALEXin (KEFLEX) 500 MG capsule [id number]  One tab po twice daily [name], MD  Active   Cholecalciferol (VITAMIN D) 50 MCG (2000 UT) tablet [id number]  Take 2,000 Units by mouth daily. [provider]  Active   Chromium 1000 MCG TABS [id number]  Take 1,000 mcg by mouth daily.  [provider]  Active   Cinnamon 500 MG capsule [id number]  Take 500 mg by mouth daily. [provider]  Active Self           Med Note Valrie Hart, APRIL   Thu Oct 21, 2018  8:17 AM)    estradiol (ESTRACE) 0.1 MG/GM vaginal cream [id number]  Place vaginally. [provider]  Active   fluticasone (FLONASE) 50 MCG/ACT nasal spray [id number]  Place 1 spray into both nostrils daily.  Patient taking differently: Place 1 spray into both nostrils as needed for allergies.   [name], MD  Expired 09/05/22 2359   furosemide (LASIX) 40 MG tablet [id number]  TAKE 1 TABLET BY MOUTH EVERY DAY IN THE Cheri Kearns, MD  Active   hydrALAZINE (APRESOLINE) 50 MG tablet [id number]  TAKE 1 TABLET BY MOUTH TWICE  DAILY Yates Decamp, MD  Active   Insulin Lispro Prot & Lispro (HUMALOG MIX 75/25 KWIKPEN) (75-25) 100 UNIT/ML Stephanie Coup [id number] Yes Inject 55 units before dinner [name], MD Taking Active   nystatin (NYSTATIN) powder [id number]  Apply topically 3 (three) times daily  as needed. As needed [name], MD  Active   Omega-3 Fatty Acids (FISH OIL) 500 MG CAPS [id number]  Take 500 mg by mouth daily. [provider]  Active   omeprazole (PRILOSEC) 40 MG capsule [id number]  TAKE 1 CAPSULE BY MOUTH DAILY  BEFORE Lavera Guise, MD  Active   pravastatin (PRAVACHOL) 40 MG tablet [id number]  TAKE 1 TABLET BY MOUTH DAILY [name], MD  Active   Probiotic Product (PROBIOTIC PO) [id number]  Take by mouth. [provider]  Active   pyridOXINE (VITAMIN B6) 25 MG tablet [id number]  Take 25 mg by mouth daily. [provider]  Active   Semaglutide, 1 MG/DOSE, (OZEMPIC, 1 MG/DOSE,) 2 MG/1.5ML SOPN [id number] Yes Inject 1 mg into the skin once a week. [name], MD Taking Active   valsartan-hydrochlorothiazide (DIOVAN-HCT) 160-25 MG tablet [id number] Yes TAKE 1 TABLET BY MOUTH DAILY [name], MD Taking Active   vitamin B-12 (CYANOCOBALAMIN) 100 MCG tablet [id number]  Take 100 mcg by mouth daily. [provider]  Active   VYZULTA 0.024 %  SOLN [id number]  Apply 1-2 drops to eye daily. [provider]  Active               Assessment/Plan:   Diabetes: - Currently controlled per last A1c.  - Reviewed long term cardiovascular and renal outcomes of uncontrolled blood sugar - Reviewed goal A1c, goal fasting, and goal 2 hour post prandial glucose. Discussed risk of hypoglycemia with mixed insulin, but patient reports no issues with that at this time.  - Recommend to continue current regimen at this time. Discussed pros and cons of SGLT2.  - Recommend to check glucose twice daily, fasting and 2 hour post prandial.   Heart Failure: - Currently appropriately managed - Reviewed appropriate blood pressure monitoring technique and reviewed goal blood pressure - Recommend to continue current regimen at this time   Hyperlipidemia/ASCVD Risk Reduction: - Currently uncontrolled, goal LDL <70.  - Recommend to continue  current regimen, recheck lipids with next visit. If LDL not at goal, consider changing to high intensity atorvastatin 40 mg daily.    Follow Up Plan: phone call in 6 weeks   Catie TClearance Coots, PharmD, BCACP, CPP Clinical Pharmacist [facility] [phone]

## 2022-12-25 LAB — LAB REPORT - SCANNED
Albumin, Urine POC: 27.6
Creatinine, POC: 107 mg/dL
EGFR: 41
Microalb Creat Ratio: 26

## 2022-12-30 ENCOUNTER — Telehealth: Payer: Self-pay

## 2022-12-30 DIAGNOSIS — E119 Type 2 diabetes mellitus without complications: Secondary | ICD-10-CM | POA: Diagnosis not present

## 2022-12-30 LAB — HM DIABETES EYE EXAM

## 2022-12-31 NOTE — Telephone Encounter (Signed)
It is disconnected. She needs to unplug the device for 5 min and re-plug and sent transmission

## 2022-12-31 NOTE — Telephone Encounter (Signed)
Called and spoke to patient she voiced understanding

## 2023-01-01 ENCOUNTER — Telehealth: Payer: Self-pay | Admitting: Internal Medicine

## 2023-01-01 DIAGNOSIS — Z1231 Encounter for screening mammogram for malignant neoplasm of breast: Secondary | ICD-10-CM | POA: Diagnosis not present

## 2023-01-01 LAB — HM MAMMOGRAPHY

## 2023-01-01 NOTE — Telephone Encounter (Signed)
OZEMPIC 1X3ML QUANTITY 4 PICK UP 12/23/22

## 2023-01-07 ENCOUNTER — Ambulatory Visit: Payer: Medicare Other

## 2023-01-07 DIAGNOSIS — I428 Other cardiomyopathies: Secondary | ICD-10-CM | POA: Diagnosis not present

## 2023-01-07 DIAGNOSIS — Z95810 Presence of automatic (implantable) cardiac defibrillator: Secondary | ICD-10-CM

## 2023-01-15 ENCOUNTER — Encounter: Payer: Self-pay | Admitting: Cardiology

## 2023-01-15 ENCOUNTER — Ambulatory Visit: Payer: Medicare Other | Admitting: Cardiology

## 2023-01-15 VITALS — BP 121/74 | HR 80 | Resp 16 | Ht 64.0 in | Wt 248.0 lb

## 2023-01-15 DIAGNOSIS — E1122 Type 2 diabetes mellitus with diabetic chronic kidney disease: Secondary | ICD-10-CM

## 2023-01-15 DIAGNOSIS — Z95810 Presence of automatic (implantable) cardiac defibrillator: Secondary | ICD-10-CM

## 2023-01-15 DIAGNOSIS — I5022 Chronic systolic (congestive) heart failure: Secondary | ICD-10-CM | POA: Diagnosis not present

## 2023-01-15 DIAGNOSIS — I428 Other cardiomyopathies: Secondary | ICD-10-CM

## 2023-01-15 DIAGNOSIS — I129 Hypertensive chronic kidney disease with stage 1 through stage 4 chronic kidney disease, or unspecified chronic kidney disease: Secondary | ICD-10-CM | POA: Diagnosis not present

## 2023-01-15 DIAGNOSIS — I10 Essential (primary) hypertension: Secondary | ICD-10-CM

## 2023-01-15 DIAGNOSIS — Z794 Long term (current) use of insulin: Secondary | ICD-10-CM | POA: Diagnosis not present

## 2023-01-15 DIAGNOSIS — N1832 Chronic kidney disease, stage 3b: Secondary | ICD-10-CM | POA: Diagnosis not present

## 2023-01-15 MED ORDER — ENTRESTO 49-51 MG PO TABS
1.0000 | ORAL_TABLET | Freq: Two times a day (BID) | ORAL | 3 refills | Status: DC
Start: 2023-01-15 — End: 2023-03-09

## 2023-01-15 MED ORDER — ENTRESTO 49-51 MG PO TABS
1.0000 | ORAL_TABLET | Freq: Two times a day (BID) | ORAL | 0 refills | Status: DC
Start: 2023-01-15 — End: 2023-02-23

## 2023-01-15 NOTE — Progress Notes (Signed)
Primary Physician/Referring:  [name], MD  Patient ID: Yvonne Miller, female    DOB: [birth year], 72 y.o.   MRN: [id number]  Chief Complaint  Patient presents with   Cardiomyopathy   Hypertension   Follow-up    6 month    HPI:    Yvonne Miller  is a 72 y.o. 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  She also has morbid obesity, OSA not on CPAP recently, hypertension, DM with stage 3b CKD, hyperlipidemia, degenerative joint disease and recent diagnosis of diabetic gastroparesis.  She presents for a 36-month office visit, presently doing well and essentially remains asymptomatic without dyspnea, PND or orthopnea or leg edema.     Past Medical History:  Diagnosis Date   AICD (automatic cardioverter/defibrillator) present    Arthritis    Asthma    Carpal tunnel syndrome, bilateral    CKD (chronic kidney disease)    Diabetes mellitus    Encounter for assessment of implantable cardioverter-defibrillator (ICD)    GERD (gastroesophageal reflux disease)    Hyperlipemia    Hypertension    Obesity    morbid   Sleep apnea    wears CPAP set at 12   Wears glasses    Past Surgical History:  Procedure Laterality Date   ABDOMINAL HYSTERECTOMY     BIOPSY  03/01/2019   Procedure: BIOPSY;  Surgeon: Charna Elizabeth, MD;  Location: [facility];  Service: Endoscopy;;   BREAST SURGERY  1968   breast mass excision   CARPAL TUNNEL RELEASE Left 07/10/2017   Procedure: CARPAL TUNNEL RELEASE;  Surgeon: Coletta Memos, MD;  Location: [facility];  Service: Neurosurgery;  Laterality: Left;  left   CARPAL TUNNEL RELEASE Right 03/19/2018   Procedure: RIGHT CARPAL TUNNEL RELEASE;  Surgeon: Coletta Memos, MD;  Location: [facility];  Service: Neurosurgery;  Laterality: Right;  right   CERVICAL SPINE SURGERY  2006   CHOLECYSTECTOMY     COLONOSCOPY WITH PROPOFOL N/A  03/01/2019   Procedure: COLONOSCOPY WITH PROPOFOL;  Surgeon: Charna Elizabeth, MD;  Location: [facility];  Service: Endoscopy;  Laterality: N/A;   DILATION AND CURETTAGE OF UTERUS     ICD IMPLANT     JOINT REPLACEMENT     TONSILLECTOMY  1961   TOTAL KNEE ARTHROPLASTY Left 01/26/2018   Procedure: LEFT TOTAL KNEE ARTHROPLASTY;  Surgeon: Kathryne Hitch, MD;  Location: [facility];  Service: Orthopedics;  Laterality: Left;   Family History  Problem Relation Age of Onset   Hypertension Mother    Stroke Mother    Heart disease Father    Heart attack Sister    Heart attack Brother     Social History   Tobacco Use   Smoking status: Never   Smokeless tobacco: Never   Tobacco comments:    She smoked while in college.   Substance Use Topics   Alcohol use: No   Marital Status: Married  ROS  Review of Systems  Cardiovascular:  Negative for chest pain, dyspnea on exertion and leg swelling.  Respiratory:  Positive for snoring (not on cpap).    Objective  Blood pressure 121/74, pulse 80, resp. rate 16, height 5\' 4"  (1.626 m), weight 248 lb (112.5 kg), SpO2 98%.     01/15/2023    8:44 AM 10/30/2022    8:29 AM 07/23/2022    8:41 AM  Vitals with BMI  Height  5\' 4"  5\' 4"  5\' 4"   Weight 248 lbs 250 lbs 6 oz 254 lbs  BMI 42.55 42.96 43.58  Systolic 121 132 --  Diastolic 74 80 --  Pulse 80 71 --     Physical Exam Constitutional:      Appearance: She is obese.  Neck:     Vascular: No carotid bruit or JVD.  Cardiovascular:     Rate and Rhythm: Normal rate and regular rhythm.     Pulses: Intact distal pulses.     Heart sounds: Normal heart sounds. No murmur heard.    No gallop.  Pulmonary:     Effort: Pulmonary effort is normal.     Breath sounds: Normal breath sounds.  Abdominal:     General: Bowel sounds are normal.     Palpations: Abdomen is soft.  Musculoskeletal:     Right lower leg: No edema.     Left lower leg: No edema.    Laboratory examination:   Lab Results   Component Value Date   NA 138 10/30/2022   K 4.4 10/30/2022   CO2 26 10/30/2022   GLUCOSE 97 10/30/2022   BUN 30 (H) 10/30/2022   CREATININE 1.35 (H) 10/30/2022   CALCIUM 10.1 10/30/2022   EGFR 41.0 12/25/2022   GFRNONAA 34 (L) 11/14/2020       Latest Ref Rng & Units 10/30/2022    9:33 AM 06/19/2022    9:22 AM 03/18/2022    9:16 AM  CMP  Glucose 70 - 99 mg/dL 97  528  85   BUN 8 - 27 mg/dL 30  36  20   Creatinine 0.57 - 1.00 mg/dL 4.13  2.44  0.10   Sodium 134 - 144 mmol/L 138  139  141   Potassium 3.5 - 5.2 mmol/L 4.4  4.5  4.3   Chloride 96 - 106 mmol/L 99  98  100   CO2 20 - 29 mmol/L 26  24  25    Calcium 8.7 - 10.3 mg/dL 27.2  9.7  9.4   Total Protein 6.0 - 8.5 g/dL 7.1  7.4  7.0   Total Bilirubin 0.0 - 1.2 mg/dL 0.3   0.2   Alkaline Phos 44 - 121 IU/L 93   105   AST 0 - 40 IU/L 17   15   ALT 0 - 32 IU/L 17   12       Latest Ref Rng & Units 10/30/2022    9:33 AM 10/21/2021    9:59 AM 11/14/2020   10:04 AM  CBC  WBC 3.4 - 10.8 x10E3/uL 9.5  8.7  10.1   Hemoglobin 11.1 - 15.9 g/dL 53.6  64.4  03.4   Hematocrit 34.0 - 46.6 % 37.9  37.9  37.7   Platelets 150 - 450 x10E3/uL 340  369  300    Lipid Panel     Component Value Date/Time   CHOL 155 10/30/2022 0933   TRIG 170 (H) 10/30/2022 0933   HDL 50 10/30/2022 0933   CHOLHDL 3.1 10/30/2022 0933   CHOLHDL 3.8 04/22/2009 0603   VLDL 45 (H) 04/22/2009 0603   LDLCALC 76 10/30/2022 0933   HEMOGLOBIN A1C Lab Results  Component Value Date   HGBA1C 6.9 (H) 10/30/2022   MPG 165.68 01/15/2018   TSH No results for input(s): "TSH" in the last 8760 hours.  BNP (last 3 results) No results for input(s): "BNP" in the last 8760 hours.  Medications and allergies   Allergies  Allergen  Reactions   Other Other (See Comments)    NO BLOOD   . Jehovah witness      Current Outpatient Medications:    ACCU-CHEK AVIVA PLUS test strip, USE TO CHECK BLOOD SUGAR 4 TIMES A DAY, Disp: 200 strip, Rfl: 11   acetaminophen (TYLENOL)  500 MG tablet, Take 500 mg by mouth every 6 (six) hours as needed for moderate pain., Disp: , Rfl:    albuterol (PROVENTIL HFA;VENTOLIN HFA) 108 (90 Base) MCG/ACT inhaler, Inhale 2 puffs into the lungs every 4 (four) hours as needed for wheezing or shortness of breath. , Disp: , Rfl:    Blood Glucose Monitoring Suppl (ACCU-CHEK GUIDE ME) w/Device KIT, USE TO CHECK BLOOD SUGAR AS DIRECTED BY THE PCP TEAM, Disp: 2 kit, Rfl: 2   carvedilol (COREG) 25 MG tablet, TAKE 1 AND 1/2 TABLETS BY MOUTH  TWICE DAILY, Disp: 300 tablet, Rfl: 2   Cholecalciferol (VITAMIN D) 50 MCG (2000 UT) tablet, Take 2,000 Units by mouth daily., Disp: , Rfl:    Chromium 1000 MCG TABS, Take 1,000 mcg by mouth daily. , Disp: , Rfl:    Cinnamon 500 MG capsule, Take 500 mg by mouth daily., Disp: , Rfl:    fluticasone (FLONASE) 50 MCG/ACT nasal spray, Place 1 spray into both nostrils daily. (Patient taking differently: Place 1 spray into both nostrils as needed for allergies.), Disp: 16 g, Rfl: 2   furosemide (LASIX) 40 MG tablet, TAKE 1 TABLET BY MOUTH EVERY DAY IN THE MORNING, Disp: 90 tablet, Rfl: 0   hydrALAZINE (APRESOLINE) 50 MG tablet, TAKE 1 TABLET BY MOUTH TWICE  DAILY, Disp: 200 tablet, Rfl: 2   Insulin Lispro Prot & Lispro (HUMALOG MIX 75/25 KWIKPEN) (75-25) 100 UNIT/ML Kwikpen, Inject 55 units before dinner, Disp: 9 mL, Rfl: 0   nystatin (NYSTATIN) powder, Apply topically 3 (three) times daily as needed. As needed, Disp: 60 g, Rfl: 2   Omega-3 Fatty Acids (FISH OIL) 500 MG CAPS, Take 500 mg by mouth daily., Disp: , Rfl:    omeprazole (PRILOSEC) 40 MG capsule, TAKE 1 CAPSULE BY MOUTH DAILY  BEFORE BREAKFAST, Disp: 90 capsule, Rfl: 3   pravastatin (PRAVACHOL) 40 MG tablet, TAKE 1 TABLET BY MOUTH DAILY, Disp: 100 tablet, Rfl: 2   Probiotic Product (PROBIOTIC PO), Take by mouth., Disp: , Rfl:    pyridOXINE (VITAMIN B6) 25 MG tablet, Take 25 mg by mouth daily., Disp: , Rfl:    sacubitril-valsartan (ENTRESTO) 49-51 MG, Take 1  tablet by mouth 2 (two) times daily., Disp: 60 tablet, Rfl: 0   sacubitril-valsartan (ENTRESTO) 49-51 MG, Take 1 tablet by mouth 2 (two) times daily., Disp: 180 tablet, Rfl: 3   Semaglutide, 1 MG/DOSE, (OZEMPIC, 1 MG/DOSE,) 2 MG/1.5ML SOPN, Inject 1 mg into the skin once a week., Disp: 3 mL, Rfl: 12   vitamin B-12 (CYANOCOBALAMIN) 100 MCG tablet, Take 100 mcg by mouth daily., Disp: , Rfl:    VYZULTA 0.024 % SOLN, Apply 1-2 drops to eye daily., Disp: , Rfl:    Radiology:   No results found.  Cardiac Studies:   Sleep study 2010 (sleep apnea-has a CPAP but doesn't use it every night).  Coronary Angiography  R+ L 03/04/11: and in 2010 Normal coronary arteries. Moderate pulmonary hypertension.  Echocardiogram 01/07/2023: Left ventricle cavity is normal in size. Mild concentric hypertrophy of the left ventricle. Severe hypokinesis to akinesis of entire anterior/ anteroseptal, apical inferoseptal, apical myocardium. Moderately depressed LV systolic function with EF 35-40%. Doppler evidence  of grade II (pseudonormal) diastolic dysfunction, elevated LAP.  Left atrial cavity is moderately dilated. No significant valvular abnormality. IVC is dilated with respiratory variation. Estimated right atrial pressure 8 mmHg. No significant change compared to previous study on 12/11/2020.   ICD - Biventricular [employer] Dynagen X4 CRT-D Model G158 ICD in place 10/28/13    Remote CRT-D transmission 10/15/2022: Longevity 1 year and 6 months. AP 2%, CRT 100%. Lead impedance and thresholds within normal limits. There were no high ventricular rate episodes. Normal function.    EKG EKG 01/15/2023: Atrially sensed and biventricularly paced rhythm at the rate of 83 bpm.  No significant change from 07/17/2022.  Assessment     ICD-10-CM   1. Non-ischemic cardiomyopathy (HCC)  I42.8 EKG 12-Lead    ECHOCARDIOGRAM COMPLETE    sacubitril-valsartan (ENTRESTO) 49-51 MG    Basic Metabolic Panel (BMET)     sacubitril-valsartan (ENTRESTO) 49-51 MG    CANCELED: ECHOCARDIOGRAM COMPLETE    2. Type 2 diabetes mellitus with stage 3b chronic kidney disease, with long-term current use of insulin (HCC)  E11.22    N18.32    Z79.4     3. Essential hypertension  I10     4. ICD: Biventricular  [employer] Dynagen X4 CRT-D Model G158 ICD in place 10/28/13  Z95.810     5. Chronic systolic heart failure (HCC)  W09.81 sacubitril-valsartan (ENTRESTO) 49-51 MG    Basic Metabolic Panel (BMET)    sacubitril-valsartan (ENTRESTO) 49-51 MG      Orders Placed This Encounter  Procedures   Basic Metabolic Panel (BMET)   EKG 12-Lead   ECHOCARDIOGRAM COMPLETE    Standing Status:   Future    Standing Expiration Date:   01/15/2024    Scheduling Instructions:     Prior to her next visit with me    Order Specific Question:   Where should this test be performed    Answer:   [facility])    Order Specific Question:   Does the patient weigh less than or greater than 250 lbs?    Answer:   Patient weighs less than 250 lbs    Order Specific Question:   Perflutren DEFINITY (image enhancing agent) should be administered unless hypersensitivity or allergy exist    Answer:   Administer Perflutren    Order Specific Question:   Reason for exam-Echo    Answer:   Dilated cardiomyopathy I42.0   Meds ordered this encounter  Medications   sacubitril-valsartan (ENTRESTO) 49-51 MG    Sig: Take 1 tablet by mouth 2 (two) times daily.    Dispense:  60 tablet    Refill:  0   sacubitril-valsartan (ENTRESTO) 49-51 MG    Sig: Take 1 tablet by mouth 2 (two) times daily.    Dispense:  180 tablet    Refill:  3    Medications Discontinued During This Encounter  Medication Reason   cephALEXin (KEFLEX) 500 MG capsule    valsartan-hydrochlorothiazide (DIOVAN-HCT) 160-25 MG tablet Change in therapy     Recommendations:   AARIEL FREDERKING  is a 72 y.o.  African-American with nonischemic dilated  cardiomyopathy S/P bi-V ICD implantation at [facility] in 2015, since then last echocardiogram at revealed normal LVEF in 2016.  She also has morbid obesity, OSA not on CPAP recently, hypertension, DM with stage 3a-b CKD, hyperlipidemia, degenerative joint disease and recent diagnosis of diabetic gastroparesis.  She presents for a 61-month office visit.  1. Non-ischemic cardiomyopathy (  HCC) Lp(a) EF has mildly decreased compared to previous, previously she had EF of around 50%.  However she remains completely asymptomatic and she is presently on guideline directed medical therapy.  She is not on Farxiga or Jardiance due to recurrent UTI.  There is no clinical evidence of heart failure.  Her pacemaker has been disconnected and will have this reconnected and will request assistance from the manufacturer. - EKG 12-Lead - ECHOCARDIOGRAM COMPLETE; Future  2. Type 2 diabetes mellitus with stage 3b chronic kidney disease, with long-term current use of insulin (HCC) I reviewed her external labs, diabetes is well-controlled, renal function has remained stable.  3. Essential hypertension Blood pressure is well-controlled and she is presently on appropriate medical therapy including valsartan HCT however in view of cardiomyopathy, will change it to [facility], will start with moderate dose and will follow-up with a BMP.  4. ICD: Biventricular  [employer] Dynagen X4 CRT-D Model G158 ICD in place 10/28/13 ICD is functioning normally however there is no recent transmission as it has been disconnected, will have this reconnected.  I will obtain BMP and if serum creatinine is stable, will uptitrate Entresto to highest dose possible.  I will see her back in 6 months for follow-up.  I will repeat echocardiogram in 6 months.    Yates Decamp, MD, [facility] 01/15/2023, 9:34 AM Office: [phone] Pager: [phone]

## 2023-01-16 ENCOUNTER — Other Ambulatory Visit: Payer: Self-pay | Admitting: Cardiology

## 2023-01-17 ENCOUNTER — Other Ambulatory Visit: Payer: Self-pay | Admitting: Cardiology

## 2023-01-20 ENCOUNTER — Encounter: Payer: Self-pay | Admitting: Cardiology

## 2023-02-05 DIAGNOSIS — L811 Chloasma: Secondary | ICD-10-CM | POA: Diagnosis not present

## 2023-02-05 DIAGNOSIS — L219 Seborrheic dermatitis, unspecified: Secondary | ICD-10-CM | POA: Diagnosis not present

## 2023-02-05 DIAGNOSIS — D2239 Melanocytic nevi of other parts of face: Secondary | ICD-10-CM | POA: Diagnosis not present

## 2023-02-05 DIAGNOSIS — L820 Inflamed seborrheic keratosis: Secondary | ICD-10-CM | POA: Diagnosis not present

## 2023-02-05 DIAGNOSIS — L821 Other seborrheic keratosis: Secondary | ICD-10-CM | POA: Diagnosis not present

## 2023-02-08 ENCOUNTER — Other Ambulatory Visit: Payer: Self-pay | Admitting: Cardiology

## 2023-02-08 DIAGNOSIS — I5042 Chronic combined systolic (congestive) and diastolic (congestive) heart failure: Secondary | ICD-10-CM

## 2023-02-09 ENCOUNTER — Telehealth: Payer: Self-pay | Admitting: Cardiology

## 2023-02-09 NOTE — Telephone Encounter (Signed)
Patient is calling about the patient assistant program to be able to get her medication. She states that she is unable to pay for the medication out of pocket. Please advise

## 2023-02-09 NOTE — Telephone Encounter (Signed)
Called patient someone picks up and then hangs up x3. Will try again later

## 2023-02-10 ENCOUNTER — Ambulatory Visit: Payer: Medicare Other | Admitting: Internal Medicine

## 2023-02-10 ENCOUNTER — Encounter: Payer: Self-pay | Admitting: Internal Medicine

## 2023-02-10 VITALS — BP 135/77 | HR 75 | Ht 64.0 in | Wt 251.4 lb

## 2023-02-10 DIAGNOSIS — R9389 Abnormal findings on diagnostic imaging of other specified body structures: Secondary | ICD-10-CM

## 2023-02-10 DIAGNOSIS — J849 Interstitial pulmonary disease, unspecified: Secondary | ICD-10-CM

## 2023-02-10 DIAGNOSIS — K219 Gastro-esophageal reflux disease without esophagitis: Secondary | ICD-10-CM | POA: Diagnosis not present

## 2023-02-10 LAB — CBC WITH DIFFERENTIAL/PLATELET
Basophils Absolute: 0 10*3/uL (ref 0.0–0.1)
Basophils Relative: 0.2 % (ref 0.0–3.0)
Eosinophils Absolute: 0.4 10*3/uL (ref 0.0–0.7)
Eosinophils Relative: 4.7 % (ref 0.0–5.0)
HCT: 37 % (ref 36.0–46.0)
Hemoglobin: 11.4 g/dL — ABNORMAL LOW (ref 12.0–15.0)
Lymphocytes Relative: 32.8 % (ref 12.0–46.0)
Lymphs Abs: 2.6 10*3/uL (ref 0.7–4.0)
MCHC: 30.8 g/dL (ref 30.0–36.0)
MCV: 82.3 fL (ref 78.0–100.0)
Monocytes Absolute: 0.4 10*3/uL (ref 0.1–1.0)
Monocytes Relative: 5.2 % (ref 3.0–12.0)
Neutro Abs: 4.5 10*3/uL (ref 1.4–7.7)
Neutrophils Relative %: 57.1 % (ref 43.0–77.0)
Platelets: 325 10*3/uL (ref 150.0–400.0)
RBC: 4.49 Mil/uL (ref 3.87–5.11)
RDW: 16.4 % — ABNORMAL HIGH (ref 11.5–15.5)
WBC: 7.8 10*3/uL (ref 4.0–10.5)

## 2023-02-10 LAB — BASIC METABOLIC PANEL
BUN: 21 mg/dL (ref 6–23)
CO2: 27 meq/L (ref 19–32)
Calcium: 8.8 mg/dL (ref 8.4–10.5)
Chloride: 105 meq/L (ref 96–112)
Creatinine, Ser: 1.21 mg/dL — ABNORMAL HIGH (ref 0.40–1.20)
GFR: 44.45 mL/min — ABNORMAL LOW (ref >60.00)
Glucose, Bld: 144 mg/dL — ABNORMAL HIGH (ref 70–99)
Potassium: 4.3 meq/L (ref 3.5–5.1)
Sodium: 141 meq/L (ref 135–145)

## 2023-02-10 LAB — BRAIN NATRIURETIC PEPTIDE: Pro B Natriuretic peptide (BNP): 84 pg/mL (ref 0.0–100.0)

## 2023-02-10 LAB — SEDIMENTATION RATE: Sed Rate: 50 mm/h — ABNORMAL HIGH (ref 0–30)

## 2023-02-10 NOTE — Addendum Note (Signed)
Addended by: Glynda Jaeger on: 02/10/2023 03:12 PM   Modules accepted: Orders

## 2023-02-10 NOTE — Progress Notes (Signed)
Erroneous encounter This encounter was created in error - please disregard. 

## 2023-02-10 NOTE — Addendum Note (Signed)
Addended by: Glynda Jaeger on: 02/10/2023 02:32 PM   Modules accepted: Orders

## 2023-02-10 NOTE — Patient Instructions (Addendum)
ICD-10-CM   1. Abnormal CT of the chest  R93.89     2. ILD (interstitial lung disease) (HCC)  J84.9     3. Gastroesophageal reflux disease, unspecified whether esophagitis present  K21.9      PCP [name], MD Is concerned for a condition called interstitial lung disease but in my view you have some interstitial lung abnormalities [ILA].  This usually before the disease actually happens.  In my view it has been there since 2021 and it is stable.  Unclear if it was that in 2011 or not  Plan - Do blood work for chemistry panel  -Results of this to inform Dr. Jacinto Halim about Sherryll Burger. - Do blood work for [employer] gold, BNP - Do blood work for [employer], rheumatoid factor, CCP, SSA, SSB, SCL 70 and ESR -Take ILD question at home and finish it and bring it back with you next visit  -Do get rid of your feather pillow which can make conditions like this worse -Continue with acid reflux control  -Fish oil can make acid reflux worse and therefore consider stopping it if it is of no benefit -Do full pulmonary function test  Follow-up - Return in 8 weeks 15-minute visit Dr. Marchelle Gearing to discuss next  steps which likely will involve continued monitoring given overall stability.  -Excess hypoxemia test at follow-up

## 2023-02-10 NOTE — Progress Notes (Signed)
OV 02/10/2023  Subjective:  Patient ID: Yvonne Miller, female , DOB: 17-Sep-1950 , age 72 y.o. , MRN: [id number] , ADDRESS: [address] [place] [address] PCP [name], MD Patient Care Team: [name], MD as PCP - General (Internal Medicine) Alden Hipp, RPH-CPP (Pharmacist)  This Provider for this visit: Treatment Team:  Attending Provider: Kalman Shan, MD    02/10/2023 -   Chief Complaint  Patient presents with   Consult    Ct 8/5 f/u      HPI Yvonne Miller 72 y.o. -new consult referred by Dr. Velna Hatchet for concern for interstitial lung disease on CT scan of the chest August 2024.  Review of the external medical records indicate that in June 2024 she saw PCP.  In 2021 there was a CT chest that showed 4 mm lung nodule therefore PCP did a CT chest and this showed interstitial lung abnormalities associated with groundglass opacities.  Therefore referred here.  Patient herself tells me she is not sure why she is here she is stable she has very minimal to no shortness of breath.  She suffers from morbid obesity and then she has had dyspnea on exertion and then at some point she did have a pacemaker/defibrillator placed.  After this the dyspnea resolved.  And she is been stable like this for at least since 2015.  Therefore she is really surprised about the referral here.  There is no cough or chest pain or wheezing.  Review of the visual images indicate that in 2011 she did have a CT abdomen.  Maybe this is mention of groundglass opacities in the right lung base anteriorly but is definitely present in the CT scan of the abdomen in 2021 along with posterior potential reticulation and anterior groundglass.  These findings are very subtle.  In the latest August 2024 CT chest in my personal opinion it is essentially unchanged.  There is no hospitalizations or ER visits  She does admit to acid reflux she is on fish oil but she believes acid  reflux is under control with omeprazole.  She does not have any autoimmune disease.  She does have a feather pillow or down pillow.  Of note she did see Dr. Jacinto Halim recently was started on Entresto.  She is asking for a chemistry panel to be checked so with her Sherryll Burger can be increased.     PFT      No data to display            LAB RESULTS last 96 hours No results found.  LAB RESULTS last 90 days Recent Results (from the past 2160 hour(s))  Lab report - scanned     Status: None   Collection Time: 12/25/22 11:23 AM  Result Value Ref Range   Creatinine, POC 107.0 mg/dL    Comment: ABSTRACTED BY HIM   Albumin, Urine POC 27.6     Comment: ABSTRACTED BY HIM   Microalb Creat Ratio 26     Comment: ABSTRACTED BY HIM   EGFR 41.0     Comment: ABSTRACTED BY HIM  HM DIABETES EYE EXAM     Status: None   Collection Time: 12/30/22 10:39 AM  Result Value Ref Range   HM Diabetic Eye Exam No Retinopathy No Retinopathy    Comment: ABSTRACTED BY HIM         has a past medical history of AICD (automatic cardioverter/defibrillator) present, Arthritis, Asthma, Carpal tunnel syndrome,  bilateral, CKD (chronic kidney disease), Diabetes mellitus, Encounter for assessment of implantable cardioverter-defibrillator (ICD), GERD (gastroesophageal reflux disease), Hyperlipemia, Hypertension, Obesity, Sleep apnea, and Wears glasses.   reports that she has never smoked. She has never used smokeless tobacco.  Past Surgical History:  Procedure Laterality Date   ABDOMINAL HYSTERECTOMY     BIOPSY  03/01/2019   Procedure: BIOPSY;  Surgeon: Charna Elizabeth, MD;  Location: [facility];  Service: Endoscopy;;   BREAST SURGERY  1968   breast mass excision   CARPAL TUNNEL RELEASE Left 07/10/2017   Procedure: CARPAL TUNNEL RELEASE;  Surgeon: Coletta Memos, MD;  Location: [facility];  Service: Neurosurgery;  Laterality: Left;  left   CARPAL TUNNEL RELEASE Right 03/19/2018   Procedure: RIGHT CARPAL TUNNEL  RELEASE;  Surgeon: Coletta Memos, MD;  Location: [facility];  Service: Neurosurgery;  Laterality: Right;  right   CERVICAL SPINE SURGERY  2006   CHOLECYSTECTOMY     COLONOSCOPY WITH PROPOFOL N/A 03/01/2019   Procedure: COLONOSCOPY WITH PROPOFOL;  Surgeon: Charna Elizabeth, MD;  Location: [facility];  Service: Endoscopy;  Laterality: N/A;   DILATION AND CURETTAGE OF UTERUS     ICD IMPLANT     JOINT REPLACEMENT     TONSILLECTOMY  1961   TOTAL KNEE ARTHROPLASTY Left 01/26/2018   Procedure: LEFT TOTAL KNEE ARTHROPLASTY;  Surgeon: Kathryne Hitch, MD;  Location: [facility];  Service: Orthopedics;  Laterality: Left;    Allergies  Allergen Reactions   Other Other (See Comments)    NO BLOOD   . Jehovah witness    Immunization History  Administered Date(s) Administered   Fluad Quad(high Dose 65+) 02/07/2020, 02/12/2021, 03/18/2022   Hepatitis A 01/19/2012, 02/16/2012   Hepatitis B 01/19/2012, 02/16/2012   IPV 02/16/2012   Influenza, High Dose Seasonal PF 02/17/2016, 03/04/2018, 02/10/2019   Influenza-Unspecified 02/02/2014, 02/16/2014   MMR 04/04/1994   PFIZER(Purple Top)SARS-COV-2 Vaccination 06/11/2019, 07/02/2019, 02/21/2020, 03/03/2022   Pfizer Covid-19 Vaccine Bivalent Booster 4yrs & up 03/20/2021   Pneumococcal Conjugate-13 05/16/2020   Pneumococcal Polysaccharide-23 06/18/2021   Td 09/08/2006   Tdap 01/19/2012, 06/21/2020   Zoster Recombinant(Shingrix) 06/04/2021, 10/21/2021   Zoster, Live 12/15/2007    Family History  Problem Relation Age of Onset   Hypertension Mother    Stroke Mother    Heart disease Father    Heart attack Sister    Heart attack Brother      Current Outpatient Medications:    ACCU-CHEK AVIVA PLUS test strip, USE TO CHECK BLOOD SUGAR 4 TIMES A DAY, Disp: 200 strip, Rfl: 11   acetaminophen (TYLENOL) 500 MG tablet, Take 500 mg by mouth every 6 (six) hours as needed for moderate pain., Disp: , Rfl:    albuterol (PROVENTIL HFA;VENTOLIN HFA) 108 (90 Base)  MCG/ACT inhaler, Inhale 2 puffs into the lungs every 4 (four) hours as needed for wheezing or shortness of breath. , Disp: , Rfl:    Blood Glucose Monitoring Suppl (ACCU-CHEK GUIDE ME) w/Device KIT, USE TO CHECK BLOOD SUGAR AS DIRECTED BY THE PCP TEAM, Disp: 2 kit, Rfl: 2   carvedilol (COREG) 25 MG tablet, TAKE 1 AND 1/2 TABLETS BY MOUTH  TWICE DAILY, Disp: 300 tablet, Rfl: 2   Cholecalciferol (VITAMIN D) 50 MCG (2000 UT) tablet, Take 2,000 Units by mouth daily., Disp: , Rfl:    Chromium 1000 MCG TABS, Take 1,000 mcg by mouth daily. , Disp: , Rfl:    Cinnamon 500 MG capsule, Take 500 mg by mouth daily., Disp: , Rfl:  fluticasone (FLONASE) 50 MCG/ACT nasal spray, Place 1 spray into both nostrils daily. (Patient taking differently: Place 1 spray into both nostrils as needed for allergies.), Disp: 16 g, Rfl: 2   furosemide (LASIX) 40 MG tablet, TAKE 1 TABLET BY MOUTH EVERY DAY IN THE MORNING, Disp: 90 tablet, Rfl: 0   hydrALAZINE (APRESOLINE) 50 MG tablet, TAKE 1 TABLET BY MOUTH TWICE  DAILY, Disp: 200 tablet, Rfl: 2   Insulin Lispro Prot & Lispro (HUMALOG MIX 75/25 KWIKPEN) (75-25) 100 UNIT/ML Kwikpen, Inject 55 units before dinner, Disp: 9 mL, Rfl: 0   nystatin (NYSTATIN) powder, Apply topically 3 (three) times daily as needed. As needed, Disp: 60 g, Rfl: 2   Omega-3 Fatty Acids (FISH OIL) 500 MG CAPS, Take 500 mg by mouth daily., Disp: , Rfl:    omeprazole (PRILOSEC) 40 MG capsule, TAKE 1 CAPSULE BY MOUTH DAILY  BEFORE BREAKFAST, Disp: 90 capsule, Rfl: 3   pravastatin (PRAVACHOL) 40 MG tablet, TAKE 1 TABLET BY MOUTH DAILY, Disp: 100 tablet, Rfl: 2   Probiotic Product (PROBIOTIC PO), Take by mouth., Disp: , Rfl:    pyridOXINE (VITAMIN B6) 25 MG tablet, Take 25 mg by mouth daily., Disp: , Rfl:    sacubitril-valsartan (ENTRESTO) 49-51 MG, Take 1 tablet by mouth 2 (two) times daily., Disp: 60 tablet, Rfl: 0   sacubitril-valsartan (ENTRESTO) 49-51 MG, Take 1 tablet by mouth 2 (two) times daily., Disp:  180 tablet, Rfl: 3   Semaglutide, 1 MG/DOSE, (OZEMPIC, 1 MG/DOSE,) 2 MG/1.5ML SOPN, Inject 1 mg into the skin once a week., Disp: 3 mL, Rfl: 12   vitamin B-12 (CYANOCOBALAMIN) 100 MCG tablet, Take 100 mcg by mouth daily., Disp: , Rfl:    VYZULTA 0.024 % SOLN, Apply 1-2 drops to eye daily., Disp: , Rfl:       Objective:   Vitals:   02/10/23 1319  BP: 135/77  Pulse: 75  SpO2: 98%  Weight: 251 lb 6.4 oz (114 kg)  Height: 5\' 4"  (1.626 m)    Estimated body mass index is 43.15 kg/m as calculated from the following:   Height as of this encounter: 5\' 4"  (1.626 m).   Weight as of this encounter: 251 lb 6.4 oz (114 kg).  @WEIGHTCHANGE @  [employer]   02/10/23 1319  Weight: 251 lb 6.4 oz (114 kg)     Physical Exam   General: No distress. obse O2 at rest: no Cane present: no Sitting in wheel chair: no Frail: no Obese: no Neuro: Alert and Oriented x 3. GCS 15. Speech normal Psych: Pleasant Resp:  Barrel Chest - no.  Wheeze - no, Crackles - no, No overt respiratory distress CVS: Normal heart sounds. Murmurs - no Ext: Stigmata of Connective Tissue Disease - no HEENT: Normal upper airway. PEERL +. No post nasal drip        Assessment:       ICD-10-CM   1. Abnormal CT of the chest  R93.89     2. ILD (interstitial lung disease) (HCC)  J84.9 CBC with Differential    Antinuclear Antib (ANA)    IgE    QuantiFERON-TB Gold Plus    B Nat Peptide    Antinuclear Antib (ANA)    Cyclic citrul peptide antibody, IgG    Sjogrens syndrome-A extractable nuclear antibody    Sjogrens syndrome-B extractable nuclear antibody    Sjogrens syndrome-A extractable nuclear antibody    Sjogrens syndrome-B extractable nuclear antibody    Anti-scleroderma antibody    Sed Rate (  ESR)    3. Gastroesophageal reflux disease, unspecified whether esophagitis present  K21.9          Plan:     Patient Instructions     ICD-10-CM   1. Abnormal CT of the chest  R93.89     2. ILD  (interstitial lung disease) (HCC)  J84.9     3. Gastroesophageal reflux disease, unspecified whether esophagitis present  K21.9      PCP [name], MD Is concerned for a condition called interstitial lung disease but in my view you have some interstitial lung abnormalities [ILA].  This usually before the disease actually happens.  In my view it has been there since 2021 and it is stable.  Unclear if it was that in 2011 or not  Plan - Do blood work for chemistry panel  -Results of this to inform Dr. Jacinto Halim about Sherryll Burger. - Do blood work for [employer] gold, BNP - Do blood work for [employer], rheumatoid factor, CCP, SSA, SSB, SCL 70 and ESR -Take ILD question at home and finish it and bring it back with you next visit  -Do get rid of your feather pillow which can make conditions like this worse -Continue with acid reflux control  -Fish oil can make acid reflux worse and therefore consider stopping it if it is of no benefit -Do full pulmonary function test  Follow-up - Return in 8 weeks 15-minute visit Dr. Marchelle Gearing to discuss next  steps which likely will involve continued monitoring given overall stability.  -Excess hypoxemia test at follow-up   FOLLOWUP Return in about 8 weeks (around 04/07/2023) for 15 min visit, with Dr Marchelle Gearing.    SIGNATURE    Dr. Kalman Shan, M.D., F.C.C.P,  Pulmonary and Critical Care Medicine Staff Physician, [facility] Director - Interstitial Lung Disease  Program  Pulmonary Fibrosis [facility] [place], [place], [address]  Pager: [phone], If no answer or between  15:00h - 7:00h: call [phone] Telephone: [phone]  2:04 PM 02/10/2023

## 2023-02-11 LAB — RHEUMATOID FACTOR: Rheumatoid fact SerPl-aCnc: <10 [IU]/mL (ref <14)

## 2023-02-12 NOTE — Telephone Encounter (Signed)
Patient returned staff call and is following up on getting an application for medication assistance for sacubitril-valsartan (ENTRESTO) 49-51 MG.   Patient noted application can also be mailed to her.

## 2023-02-13 LAB — ANTI-SCLERODERMA ANTIBODY: Scleroderma (Scl-70) (ENA) Antibody, IgG: <1 AI

## 2023-02-13 LAB — QUANTIFERON-TB GOLD PLUS
Mitogen-NIL: 8.11 [IU]/mL
NIL: 0.02 [IU]/mL
QuantiFERON-TB Gold Plus: NEGATIVE
TB1-NIL: 0 [IU]/mL
TB2-NIL: 0 [IU]/mL

## 2023-02-13 LAB — SJOGRENS SYNDROME-A EXTRACTABLE NUCLEAR ANTIBODY: SSA (Ro) (ENA) Antibody, IgG: <1 AI

## 2023-02-13 LAB — ANTI-NUCLEAR AB-TITER (ANA TITER): ANA Titer 1: 1:160 {titer} — ABNORMAL HIGH

## 2023-02-13 LAB — CYCLIC CITRUL PEPTIDE ANTIBODY, IGG: Cyclic Citrullin Peptide Ab: <16 U

## 2023-02-13 LAB — IGE: IgE (Immunoglobulin E), Serum: 52 kU/L (ref <=114)

## 2023-02-13 LAB — SJOGRENS SYNDROME-B EXTRACTABLE NUCLEAR ANTIBODY: SSB (La) (ENA) Antibody, IgG: <1 AI

## 2023-02-13 LAB — ANA: Anti Nuclear Antibody (ANA): POSITIVE — AB

## 2023-02-16 ENCOUNTER — Encounter: Payer: Self-pay | Admitting: Cardiology

## 2023-02-16 DIAGNOSIS — I428 Other cardiomyopathies: Secondary | ICD-10-CM

## 2023-02-16 DIAGNOSIS — I5022 Chronic systolic (congestive) heart failure: Secondary | ICD-10-CM

## 2023-02-17 NOTE — Telephone Encounter (Signed)
Informed patient via mychart that application will be mailed out tomorrow.

## 2023-02-23 ENCOUNTER — Other Ambulatory Visit (HOSPITAL_COMMUNITY): Payer: Self-pay

## 2023-02-23 MED ORDER — ENTRESTO 49-51 MG PO TABS
1.0000 | ORAL_TABLET | Freq: Two times a day (BID) | ORAL | Status: DC
Start: 2023-02-23 — End: 2023-03-09

## 2023-03-04 ENCOUNTER — Other Ambulatory Visit (HOSPITAL_COMMUNITY): Payer: Self-pay

## 2023-03-04 ENCOUNTER — Institutional Professional Consult (permissible substitution): Payer: Medicare Other | Admitting: Internal Medicine

## 2023-03-04 ENCOUNTER — Telehealth: Payer: Self-pay

## 2023-03-04 NOTE — Telephone Encounter (Signed)
Pt called to inquire on ozempic shipment. Gave pt novo nordisk phone number to follow up.  Informed patient that re-enrollment is now open for companies [employer] (Humalog) and [employer] (Ozempic). Pt has an appt 03/10/23 with Dr. Allyne Gee and would like to receive application in person if possible.  Is there a specific person I can email or fax this to so it can be given to her at that appt?

## 2023-03-04 NOTE — Telephone Encounter (Signed)
Patient Advocate Encounter   The patient was approved for a [employer] grant that will help cover the cost of ENTRESTO Total amount awarded, $10,000.  Effective: 02/02/23 - 02/01/24   NUU:[id number] YQI:[id number] DGLOV:[id number]   Pharmacy provided with approval and processing information. Patient informed via Dorcas Carrow, CPhT  Pharmacy Patient Advocate Specialist  Direct Number: [phone] Fax: [phone]

## 2023-03-04 NOTE — Telephone Encounter (Signed)
Pt placing Novartis application back in the mail today (to [employer] addr) with attn to [employer].

## 2023-03-05 NOTE — Telephone Encounter (Signed)
Disregard.   Yvonne Miller will be enrolling the patient for a grant for this medication!

## 2023-03-09 ENCOUNTER — Ambulatory Visit: Payer: Medicare Other | Admitting: Internal Medicine

## 2023-03-09 ENCOUNTER — Other Ambulatory Visit: Payer: Self-pay | Admitting: Pharmacist

## 2023-03-09 ENCOUNTER — Telehealth: Payer: Self-pay | Admitting: Cardiology

## 2023-03-09 ENCOUNTER — Other Ambulatory Visit (HOSPITAL_COMMUNITY): Payer: Self-pay

## 2023-03-09 DIAGNOSIS — I428 Other cardiomyopathies: Secondary | ICD-10-CM

## 2023-03-09 DIAGNOSIS — I5022 Chronic systolic (congestive) heart failure: Secondary | ICD-10-CM

## 2023-03-09 MED ORDER — ENTRESTO 49-51 MG PO TABS
1.0000 | ORAL_TABLET | Freq: Two times a day (BID) | ORAL | 3 refills | Status: DC
Start: 2023-03-09 — End: 2024-01-11

## 2023-03-09 NOTE — Progress Notes (Signed)
Care Coordination Call  Received message that patient had questions about patient assistance. Contacted patient. She has 1 tablet of Entresto remaining. She was enrolled in [employer], but she has not checked MyChart to know that. Additionally, script was sent to [facility] and would need to be at a local pharmacy for grant and due to patient supply.   Collaborated with Dr. Jacinto Halim. [facility] with his co-sign required. CPhT will call grant information to [facility] for patient to pick up.   Will also prepare paperwork for Ozempic and Humalog Mix re-enrollment. Patient sees PCP tomorrow.   [name], PharmD, BCACP, CPP Clinical Pharmacist [facility] [phone]

## 2023-03-09 NOTE — Telephone Encounter (Signed)
Pt's Novartis patient assistance application was scanned to [employer], [facility] email. FYI

## 2023-03-09 NOTE — Telephone Encounter (Signed)
Patient mailed pt assistance forms . Received on 11/04 and placed in red folder in front for you . Thank you

## 2023-03-10 ENCOUNTER — Ambulatory Visit: Payer: Medicare Other | Admitting: Internal Medicine

## 2023-03-10 ENCOUNTER — Encounter: Payer: Self-pay | Admitting: Internal Medicine

## 2023-03-10 VITALS — BP 108/70 | HR 74 | Temp 97.7°F | Ht 64.0 in | Wt 246.4 lb

## 2023-03-10 DIAGNOSIS — N1832 Chronic kidney disease, stage 3b: Secondary | ICD-10-CM | POA: Diagnosis not present

## 2023-03-10 DIAGNOSIS — I5022 Chronic systolic (congestive) heart failure: Secondary | ICD-10-CM

## 2023-03-10 DIAGNOSIS — I130 Hypertensive heart and chronic kidney disease with heart failure and stage 1 through stage 4 chronic kidney disease, or unspecified chronic kidney disease: Secondary | ICD-10-CM | POA: Diagnosis not present

## 2023-03-10 DIAGNOSIS — E7800 Pure hypercholesterolemia, unspecified: Secondary | ICD-10-CM

## 2023-03-10 DIAGNOSIS — Z794 Long term (current) use of insulin: Secondary | ICD-10-CM

## 2023-03-10 DIAGNOSIS — K219 Gastro-esophageal reflux disease without esophagitis: Secondary | ICD-10-CM | POA: Insufficient documentation

## 2023-03-10 DIAGNOSIS — E66813 Obesity, class 3: Secondary | ICD-10-CM

## 2023-03-10 DIAGNOSIS — E1122 Type 2 diabetes mellitus with diabetic chronic kidney disease: Secondary | ICD-10-CM

## 2023-03-10 DIAGNOSIS — E2839 Other primary ovarian failure: Secondary | ICD-10-CM

## 2023-03-10 DIAGNOSIS — Z6841 Body Mass Index (BMI) 40.0 and over, adult: Secondary | ICD-10-CM

## 2023-03-10 DIAGNOSIS — I428 Other cardiomyopathies: Secondary | ICD-10-CM

## 2023-03-10 LAB — HEMOGLOBIN A1C
Est. average glucose Bld gHb Est-mCnc: 148 mg/dL
Hgb A1c MFr Bld: 6.8 % — ABNORMAL HIGH (ref 4.8–5.6)

## 2023-03-10 MED ORDER — FAMOTIDINE 20 MG PO TABS: ORAL_TABLET | ORAL | 1 refills | Status: DC

## 2023-03-10 NOTE — Assessment & Plan Note (Signed)
Chronic, home readings are between 85-127.  She will continue with Ozempic 1mg  weekly, Humalog 75/25

## 2023-03-10 NOTE — Progress Notes (Signed)
I,[name], CMA,acting as a Neurosurgeon for [name], MD.,have documented all relevant documentation on the behalf of [name], MD,as directed by  [name], MD while in the presence of [name], MD.  Subjective:  Patient ID: Yvonne Miller , female    DOB: 01-10-1951 , 72 y.o.   MRN: [id number]  Chief Complaint  Patient presents with   Diabetes   Hypertension    HPI  She is here today for a diabetes and HTN & cholesterol f/u. She reports compliance with meds. She denies having any headaches, chest pain, and palpitations.   She reports having acid reflux flare ups. She states this started last Wednesday. She admits taking Omeprazole & Tums daily.   Diabetes She presents for her follow-up diabetic visit. She has type 2 diabetes mellitus. Her disease course has been stable. There are no hypoglycemic associated symptoms. Pertinent negatives for diabetes include no blurred vision, no chest pain, no polydipsia, no polyphagia and no polyuria. There are no hypoglycemic complications. Diabetic complications include nephropathy. Risk factors for coronary artery disease include diabetes mellitus, dyslipidemia, hypertension, obesity and sedentary lifestyle. Current diabetic treatment includes insulin injections. She is compliant with treatment some of the time. She is following a generally healthy diet. She participates in exercise intermittently. Her breakfast blood glucose is taken between 8-9 am. Her breakfast blood glucose range is generally 110-130 mg/dl. An ACE inhibitor/angiotensin II receptor blocker is being taken.  Hypertension This is a chronic problem. The current episode started more than 1 year ago. The problem has been gradually improving since onset. The problem is controlled. Pertinent negatives include no blurred vision, chest pain, palpitations or shortness of breath. The current treatment provides moderate improvement. Compliance problems include exercise.   Hypertensive end-organ damage includes kidney disease and heart failure.     Past Medical History:  Diagnosis Date   AICD (automatic cardioverter/defibrillator) present    Arthritis    Asthma    Carpal tunnel syndrome, bilateral    CKD (chronic kidney disease)    Diabetes mellitus    Encounter for assessment of implantable cardioverter-defibrillator (ICD)    GERD (gastroesophageal reflux disease)    Hyperlipemia    Hypertension    Obesity    morbid   Sleep apnea    wears CPAP set at 12   Wears glasses      Family History  Problem Relation Age of Onset   Hypertension Mother    Stroke Mother    Heart disease Father    Heart attack Sister    Heart attack Brother      Current Outpatient Medications:    ACCU-CHEK AVIVA PLUS test strip, USE TO CHECK BLOOD SUGAR 4 TIMES A DAY, Disp: 200 strip, Rfl: 11   acetaminophen (TYLENOL) 500 MG tablet, Take 500 mg by mouth every 6 (six) hours as needed for moderate pain., Disp: , Rfl:    albuterol (PROVENTIL HFA;VENTOLIN HFA) 108 (90 Base) MCG/ACT inhaler, Inhale 2 puffs into the lungs every 4 (four) hours as needed for wheezing or shortness of breath. , Disp: , Rfl:    Blood Glucose Monitoring Suppl (ACCU-CHEK GUIDE ME) w/Device KIT, USE TO CHECK BLOOD SUGAR AS DIRECTED BY THE PCP TEAM, Disp: 2 kit, Rfl: 2   carvedilol (COREG) 25 MG tablet, TAKE 1 AND 1/2 TABLETS BY MOUTH  TWICE DAILY, Disp: 300 tablet, Rfl: 2   Cholecalciferol (VITAMIN D) 50 MCG (2000 UT) tablet, Take 2,000 Units by mouth daily.,  Disp: , Rfl:    Chromium 1000 MCG TABS, Take 1,000 mcg by mouth daily. , Disp: , Rfl:    Cinnamon 500 MG capsule, Take 500 mg by mouth daily., Disp: , Rfl:    famotidine (PEPCID) 20 MG tablet, One tab po daily as needed, Disp: 90 tablet, Rfl: 1   fluticasone (FLONASE) 50 MCG/ACT nasal spray, Place 1 spray into both nostrils daily. (Patient taking differently: Place 1 spray into both nostrils as needed for allergies.), Disp: 16 g, Rfl: 2    furosemide (LASIX) 40 MG tablet, TAKE 1 TABLET BY MOUTH EVERY DAY IN THE MORNING, Disp: 90 tablet, Rfl: 3   hydrALAZINE (APRESOLINE) 50 MG tablet, TAKE 1 TABLET BY MOUTH TWICE  DAILY, Disp: 200 tablet, Rfl: 2   Insulin Lispro Prot & Lispro (HUMALOG MIX 75/25 KWIKPEN) (75-25) 100 UNIT/ML Kwikpen, Inject 55 units before dinner, Disp: 9 mL, Rfl: 0   nystatin (NYSTATIN) powder, Apply topically 3 (three) times daily as needed. As needed, Disp: 60 g, Rfl: 2   Omega-3 Fatty Acids (FISH OIL) 500 MG CAPS, Take 500 mg by mouth daily., Disp: , Rfl:    omeprazole (PRILOSEC) 40 MG capsule, TAKE 1 CAPSULE BY MOUTH DAILY  BEFORE BREAKFAST, Disp: 90 capsule, Rfl: 3   pravastatin (PRAVACHOL) 40 MG tablet, TAKE 1 TABLET BY MOUTH DAILY, Disp: 100 tablet, Rfl: 2   Probiotic Product (PROBIOTIC PO), Take by mouth., Disp: , Rfl:    pyridOXINE (VITAMIN B6) 25 MG tablet, Take 25 mg by mouth daily., Disp: , Rfl:    sacubitril-valsartan (ENTRESTO) 49-51 MG, Take 1 tablet by mouth 2 (two) times daily., Disp: 180 tablet, Rfl: 3   Semaglutide, 1 MG/DOSE, (OZEMPIC, 1 MG/DOSE,) 2 MG/1.5ML SOPN, Inject 1 mg into the skin once a week., Disp: 3 mL, Rfl: 12   vitamin B-12 (CYANOCOBALAMIN) 100 MCG tablet, Take 100 mcg by mouth daily., Disp: , Rfl:    VYZULTA 0.024 % SOLN, Apply 1-2 drops to eye daily., Disp: , Rfl:    Allergies  Allergen Reactions   Other Other (See Comments)    NO BLOOD   . Jehovah witness     Review of Systems  Constitutional: Negative.   Eyes:  Negative for blurred vision.  Respiratory: Negative.  Negative for shortness of breath.   Cardiovascular: Negative.  Negative for chest pain and palpitations.  Gastrointestinal:        She has been experiencing more reflux. More heartburn. Sh has been taking omeprazole and Tums.  Endocrine: Negative for polydipsia, polyphagia and polyuria.  Neurological: Negative.   Psychiatric/Behavioral: Negative.       Today's Vitals   03/10/23 0833  BP: 108/70  Pulse: 74   Temp: 97.7 F (36.5 C)  SpO2: 98%  Weight: 246 lb 6.4 oz (111.8 kg)  Height: 5\' 4"  (1.626 m)   Body mass index is 42.29 kg/m.  Wt Readings from Last 3 Encounters:  03/10/23 246 lb 6.4 oz (111.8 kg)  02/10/23 251 lb 6.4 oz (114 kg)  01/15/23 248 lb (112.5 kg)     Objective:  Physical Exam Vitals and nursing note reviewed.  Constitutional:      Appearance: Normal appearance. She is obese.  HENT:     Head: Normocephalic and atraumatic.  Eyes:     Extraocular Movements: Extraocular movements intact.  Cardiovascular:     Rate and Rhythm: Normal rate and regular rhythm.     Heart sounds: Normal heart sounds.  Pulmonary:     Effort:  Pulmonary effort is normal.     Breath sounds: Normal breath sounds.  Musculoskeletal:     Cervical back: Normal range of motion.  Skin:    General: Skin is warm.  Neurological:     General: No focal deficit present.     Mental Status: She is alert.  Psychiatric:        Mood and Affect: Mood normal.        Behavior: Behavior normal.         Assessment And Plan:  Type 2 diabetes mellitus with stage 3b chronic kidney disease, with long-term current use of insulin (HCC) Assessment & Plan: Chronic, home readings are between 85-127.  She will continue with Ozempic 1mg  weekly, Humalog 75/25  Orders: -     Hemoglobin A1c  Hypertensive heart and renal disease with renal failure, stage 1 through stage 4 or unspecified chronic kidney disease, with heart failure (HCC) Assessment & Plan: Chronic, well controlled.  She will continue with carvedilol 25mg  1-1/2 tabs twice daily, furosemide 40mg  and hydralazine 50mg  twice daily. Encouraged to follow low sodium diet. Reminded to avoid NSAIDs to decrease risk of CKD progression.    Non-ischemic cardiomyopathy (HCC) Assessment & Plan: Her EF has improved, most recent echo reviewed in detail. Reminded of importance of medication/dietary compliance. She will continue with regular f/u with Cardiology.     Gastroesophageal reflux disease without esophagitis Assessment & Plan: Chronic, she will continue with omeprazole now. I will add famotidine to take nightly. Reminded to stop eating 3 hrs prior to lying down. Also advised to avoid known triggers.    Class 3 severe obesity due to excess calories with serious comorbidity and body mass index (BMI) of 40.0 to 44.9 in adult [facility]) Assessment & Plan: BMI 42. She was congratulated on her 5lb weight loss since last month. She is encouraged to resume her regular exercise regimen.    Other orders -     Famotidine; One tab po daily as needed  Dispense: 90 tablet; Refill: 1  She is encouraged to strive for BMI less than 30 to decrease cardiac risk. Advised to aim for at least 150 minutes of exercise per week.    Return if symptoms worsen or fail to improve.  Patient was given opportunity to ask questions. Patient verbalized understanding of the plan and was able to repeat key elements of the plan. All questions were answered to their satisfaction.    I, [name], MD, have reviewed all documentation for this visit. The documentation on 03/10/23 for the exam, diagnosis, procedures, and orders are all accurate and complete.   IF YOU HAVE BEEN REFERRED TO A SPECIALIST, IT MAY TAKE 1-2 WEEKS TO SCHEDULE/PROCESS THE REFERRAL. IF YOU HAVE NOT HEARD FROM US/SPECIALIST IN TWO WEEKS, PLEASE GIVE [place] A CALL AT [phone] X [phone].   THE PATIENT IS ENCOURAGED TO PRACTICE SOCIAL DISTANCING DUE TO THE COVID-19 PANDEMIC.

## 2023-03-10 NOTE — Patient Instructions (Signed)

## 2023-03-10 NOTE — Assessment & Plan Note (Signed)
BMI 42. She was congratulated on her 5lb weight loss since last month. She is encouraged to resume her regular exercise regimen.

## 2023-03-15 ENCOUNTER — Encounter: Payer: Self-pay | Admitting: Internal Medicine

## 2023-03-15 NOTE — Assessment & Plan Note (Signed)
Chronic, she will continue with omeprazole now. I will add famotidine to take nightly. Reminded to stop eating 3 hrs prior to lying down. Also advised to avoid known triggers.

## 2023-03-15 NOTE — Assessment & Plan Note (Signed)
Chronic, well controlled.  She will continue with carvedilol 25mg  1-1/2 tabs twice daily, furosemide 40mg  and hydralazine 50mg  twice daily. Encouraged to follow low sodium diet. Reminded to avoid NSAIDs to decrease risk of CKD progression.

## 2023-03-15 NOTE — Assessment & Plan Note (Signed)
Her EF has improved, most recent echo reviewed in detail. Reminded of importance of medication/dietary compliance. She will continue with regular f/u with Cardiology.

## 2023-03-28 ENCOUNTER — Other Ambulatory Visit: Payer: Self-pay | Admitting: Internal Medicine

## 2023-03-28 DIAGNOSIS — E1122 Type 2 diabetes mellitus with diabetic chronic kidney disease: Secondary | ICD-10-CM

## 2023-04-06 ENCOUNTER — Other Ambulatory Visit: Payer: Self-pay | Admitting: Pharmacist

## 2023-04-06 ENCOUNTER — Other Ambulatory Visit: Payer: Self-pay | Admitting: Internal Medicine

## 2023-04-06 NOTE — Progress Notes (Signed)
Pharmacy Medication Assistance Program Note    04/06/2023  Patient ID: Yvonne Miller, female   DOB: 26-Apr-1951, 72 y.o.   MRN: [id number]     04/06/2023  Outreach Medication One  Initial Outreach Date (Medication One) 04/06/2023  Manufacturer Medication One [employer] [facility] Ozempic  Dose of Ozempic 1 mg  Type of Radiographer, therapeutic Assistance  Date Application Sent to Patient 04/06/2023  Application Items Requested Application  Date Application Sent to Prescriber 04/06/2023  Name of Prescriber [name]  Date Application Received From Patient 04/06/2023  Application Items Received From Patient Application  Date Application Received From Provider 04/06/2023  Date Application Submitted to Manufacturer 04/06/2023  Method Application Sent to Manufacturer Fax         04/06/2023  Outreach Medication Two  Initial Outreach Date (Medication Two) 04/06/2023  Manufacturer Medication Two [employer]  [facility] Humalog  Dose of Humalog 75/25  Type of Assistance Manufacturer Assistance  Date Application Sent to Patient 04/06/2023  Application Items Requested Application  Date Application Sent to Prescriber 04/06/2023  Name of Prescriber [name]  Date Application Received From Patient 04/06/2023  Application Items Received From Patient Application  Date Application Received From Provider 04/06/2023  Method Application Sent to Manufacturer Fax  Date Application Submitted to Manufacturer 04/06/2023      Catie TClearance Coots, PharmD, BCACP, CPP Clinical Pharmacist [facility] [phone]

## 2023-04-07 ENCOUNTER — Other Ambulatory Visit: Payer: Self-pay

## 2023-04-07 MED ORDER — PRAVASTATIN SODIUM 40 MG PO TABS: 40.0000 mg | ORAL_TABLET | Freq: Every day | ORAL | 2 refills | Status: DC

## 2023-04-10 NOTE — Progress Notes (Signed)
Pharmacy Medication Assistance Program Note    04/10/2023  Patient ID: Yvonne Miller, female   DOB: Sep 30, 1950, 72 y.o.   MRN: [id number]     04/06/2023  Outreach Medication One  Initial Outreach Date (Medication One) 04/06/2023  Manufacturer Medication One [employer] [facility] Ozempic  Dose of Ozempic 1 mg  Type of Radiographer, therapeutic Assistance  Date Application Sent to Patient 04/06/2023  Application Items Requested Application  Date Application Sent to Prescriber 04/06/2023  Name of Prescriber [name]  Date Application Received From Patient 04/06/2023  Application Items Received From Patient Application  Date Application Received From Provider 04/06/2023  Date Application Submitted to Manufacturer 04/06/2023  Method Application Sent to Manufacturer Fax  Patient Assistance Determination Approved  Approval Start Date 05/06/2023  Approval End Date 05/04/2024      [name], PharmD, BCACP, CPP Clinical Pharmacist [facility] [phone]

## 2023-04-20 ENCOUNTER — Telehealth: Payer: Self-pay | Admitting: Cardiology

## 2023-04-20 ENCOUNTER — Encounter: Payer: Self-pay | Admitting: Cardiology

## 2023-04-20 ENCOUNTER — Other Ambulatory Visit: Payer: Medicare Other | Admitting: Pharmacist

## 2023-04-20 DIAGNOSIS — E1122 Type 2 diabetes mellitus with diabetic chronic kidney disease: Secondary | ICD-10-CM

## 2023-04-20 DIAGNOSIS — Z794 Long term (current) use of insulin: Secondary | ICD-10-CM

## 2023-04-20 DIAGNOSIS — N1832 Chronic kidney disease, stage 3b: Secondary | ICD-10-CM

## 2023-04-20 MED ORDER — DAPAGLIFLOZIN PROPANEDIOL 5 MG PO TABS: 5.0000 mg | ORAL_TABLET | Freq: Every day | ORAL | Status: DC

## 2023-04-20 MED ORDER — FREESTYLE LIBRE 3 PLUS SENSOR MISC: 3 refills | Status: DC

## 2023-04-20 NOTE — Telephone Encounter (Signed)
Contacted via staff message by Device Clinic RN to get patient scheduled in March to establish care with EP MD as she is a Dr. Jacinto Halim patient. Called patient and let her know that since Dr. Jacinto Halim is now apart of [facility] and no longer following the devices, we needed to get her set up with our EP group to do so. She states "No, no, no. This is not what Dr. Jacinto Halim told me. He told me back in September when I saw him that nothing would change." I let the patient know that he can still be her general cardiologist but for her device, we need to get her scheduled with EP. She states "If I can't see Dr. Jacinto Halim then I don't want to see anyone". Please advise further.

## 2023-04-20 NOTE — Progress Notes (Unsigned)
04/20/2023 Name: Yvonne Miller MRN: [id number] DOB: August 31, 1950  Chief Complaint  Patient presents with   Medication Management   Diabetes   Hypertension    YANITZIA Miller is a 72 y.o. year old female who presented for a telephone visit.   They were referred to the pharmacist by their PCP for assistance in managing diabetes, hypertension, and hyperlipidemia.    Subjective:  Care Team: Primary Care Provider: [name], MD ; Next Scheduled Visit: 07/07/23 Cardiology: Jacinto Halim, needs follow up Nephrologist: Glenna Fellows, due for follow up in April   Medication Access/Adherence  Current Pharmacy:  [facility]) - [place], [place] - [address] [facility] [address] [place] [address] [place] [address] Phone: [phone] Fax: [phone]  [id number] - [place], [place] - [address] [place] [address] Phone: [phone] Fax: [phone]  [facility] - [place], [place] - [address] [place] [address] Phone: [phone] Fax: [phone]  [facility] [address] [place] - [address] [facility] [address] [place] [address] Phone: [phone] Fax: [phone]  [facility] - [place], [place] - [address] [place] [address] Phone: [phone] Fax: [phone]   Patient reports affordability concerns with their medications: No  Patient reports access/transportation concerns to their pharmacy: No  Patient reports adherence concerns with their medications:  No     Diabetes:  Current medications: Ozempic 1 mg weekly; Humalog 75/25 mg in the evening - 40-55 units per sliding scale after meals Medications tried in the past: metformin - renal function borderline for re-initiation  Does report a history of kidney infections, but notes this has been  better controlled lately;  Current glucose readings: fasting this morning: 81, usually 90-120s; pre supper: 130-140s; 2 hours after: 140s   Patient denies hypoglycemic s/sx including dizziness, shakiness, sweating.  Current meal patterns:  - Breakfast: boiled egg; 1/2-1 cup of fruit; sometimes yogurt or protein shake; sometimes oatmeal;  - Lunch: usually light, sometimes eats out with friends;  - Supper: bigger meal; spaghetti on Thursdays at church; usually oven baked  - Drinks: water  Current physical activity: 30 minutes of light walking a day, plans to re-connect with the gym; walking 2k steps, ~ 1 mile daily  Current medication access support: Approved for [employer] assistance for Ozempic, Humalog in progress  Heart Failure (EF 35-40% 01/2023):  Current medications:  ACEi/ARB/ARNI: Entresto 49/51 mg twice daily SGLT2i: none, consider moving forward Beta blocker: carvedilol 37.5 mg twice daily Mineralocorticoid Receptor Antagonist: none Diuretic regimen: furosemide 40 mg daily   Current medication access support: [employer] for Entresto  Hyperlipidemia/ASCVD Risk Reduction  Current lipid lowering medications: pravastatin 40 mg daily   Clinical ASCVD: No  The 10-year ASCVD risk score (Arnett DK, et al., 2019) is: 18.6%   Values used to calculate the score:     Age: 22 years     Sex: Female     Is Non-Hispanic African American: Yes     Diabetic: Yes     Tobacco smoker: No     Systolic Blood Pressure: 108 mmHg     Is BP treated: Yes     HDL Cholesterol: 50 mg/dL     Total Cholesterol: 155 mg/dL     Objective:  Lab Results  Component Value Date   HGBA1C 6.8 (  H) 03/10/2023    Lab Results  Component Value Date   CREATININE 1.21 (H) 02/10/2023   BUN 21 02/10/2023   NA 141 02/10/2023   K 4.3 02/10/2023   CL 105 02/10/2023   CO2 27 02/10/2023    Lab Results  Component Value Date   CHOL 155 10/30/2022   HDL 50 10/30/2022   LDLCALC 76 10/30/2022   TRIG  170 (H) 10/30/2022   CHOLHDL 3.1 10/30/2022    Medications Reviewed Today     Reviewed by Alden Hipp, RPH-CPP (Pharmacist) on 04/20/23 at 1009  Med List Status: <None>   Medication Order Taking? Sig Documenting Provider Last Dose Status Informant  ACCU-CHEK AVIVA PLUS test strip [id number] Yes USE TO CHECK BLOOD SUGAR 4 TIMES A Sharyon Medicus, MD Taking Active   acetaminophen (TYLENOL) 500 MG tablet [id number]  Take 500 mg by mouth every 6 (six) hours as needed for moderate pain. [provider]  Active   albuterol (PROVENTIL HFA;VENTOLIN HFA) 108 (90 Base) MCG/ACT inhaler [id number] No Inhale 2 puffs into the lungs every 4 (four) hours as needed for wheezing or shortness of breath.   Patient not taking: Reported on 04/20/2023   [provider] Not Taking Active Self  Blood Glucose Monitoring Suppl (ACCU-CHEK GUIDE ME) w/Device KIT [id number] Yes USE TO CHECK BLOOD SUGAR AS DIRECTED BY THE PCP TEAM [name], MD Taking Active   carvedilol (COREG) 25 MG tablet [id number] Yes TAKE 1 AND 1/2 TABLETS BY MOUTH  TWICE DAILY Yates Decamp, MD Taking Active   Cholecalciferol (VITAMIN D) 50 MCG (2000 UT) tablet [id number] Yes Take 2,000 Units by mouth daily. [provider] Taking Active   Chromium 1000 MCG TABS [id number] Yes Take 1,000 mcg by mouth daily.  [provider] Taking Active   Cinnamon 500 MG capsule [id number] Yes Take 500 mg by mouth daily. [provider] Taking Active Self           Med Note Valrie Hart, APRIL   Thu Oct 21, 2018  8:17 AM)    famotidine (PEPCID) 20 MG tablet [id number] Yes One tab po daily as needed [name], MD Taking Active   fluticasone [facility]) 50 MCG/ACT nasal spray [id number] Yes Place 1 spray into both nostrils daily.  Patient taking differently: Place 1 spray into both nostrils as needed for allergies.   [name], MD Taking Active   furosemide (LASIX) 40 MG tablet [id number] Yes TAKE 1 TABLET BY  MOUTH EVERY DAY IN THE Cheri Kearns, MD Taking Active   hydrALAZINE (APRESOLINE) 50 MG tablet [id number] Yes TAKE 1 TABLET BY MOUTH TWICE  DAILY Yates Decamp, MD Taking Active   Insulin Lispro Prot & Lispro (HUMALOG MIX 75/25 KWIKPEN) (75-25) 100 UNIT/ML Stephanie Coup [id number]  DIAL AND INJECT 60 UNITS UNDER THE SKIN AT BEDTIME. MAX DAILY DOSE OF 60 UNITS [name], MD  Active   nystatin (NYSTATIN) powder [id number]  Apply topically 3 (three) times daily as needed. As needed [name], MD  Active   Omega-3 Fatty Acids (FISH OIL) 500 MG CAPS [id number]  Take 500 mg by mouth daily. [provider]  Active   omeprazole (PRILOSEC) 40 MG capsule [id number] Yes TAKE 1 CAPSULE BY MOUTH DAILY  BEFORE Lavera Guise, MD Taking Active   pravastatin (PRAVACHOL) 40 MG tablet [id number] Yes Take 1 tablet (40 mg total) by mouth daily. [name], MD Taking Active   Probiotic Product (PROBIOTIC PO) [id number] Yes Take by mouth. [provider] Taking Active   pyridOXINE (VITAMIN B6) 25 MG tablet [id number] Yes Take 25 mg by mouth daily. [provider] Taking Active   sacubitril-valsartan (ENTRESTO) 49-51 MG [id number] Yes Take 1 tablet by mouth 2 (two) times daily. Yates Decamp, MD Taking Active   Semaglutide, 1 MG/DOSE, (OZEMPIC, 1 MG/DOSE,) 2 MG/1.5ML SOPN [id number]  Inject 1 mg into the skin once a week. [name], MD  Active   vitamin B-12 (CYANOCOBALAMIN) 100 MCG tablet [id number] Yes Take 100 mcg by mouth daily. [provider] Taking Active   VYZULTA 0.024 % SOLN [id number]  Apply 1-2 drops to eye daily. [provider]  Active               Assessment/Plan:   Diabetes: - Currently controlled per home readings but continued risk of hypoglycemia with mixed insulin. - Reviewed goal A1c, goal fasting, and goal 2 hour post prandial glucose - Discussed to take mixed insulin before supper instead of after. Discussed CGM. Patient amenable. Will  pursue insurance coverage. Patient would benefit from CGM due to concurrent insulin prescription to reduce risk of hypoglycemia. PA approved - order sent. Scheduled in person visit for device teaching. - Reviewed dietary modifications including: praised for focus on proteins, fruits and vegetables, whole grains.  - Reviewed lifestyle modifications including: working up on physical activity as able - Discussed SGLT2. Patient notes she would be interested in trying given renal, CV, and DM benefit. Will provide samples of Farxiga 10 mg, with recommendation to take 5 mg daily. Will target guideline directed HF/CKD dose of 10 mg daily as able.  - Contacted [employer]. Patient approved for assistance for 2025 for Humalog. Patient previously approved for assistance for Ozempic. Will follow tolerability of Yvonne Miller and pursue assistance as needed.  - Recommend to check glucose using Libre   Hyperlipidemia/ASCVD Risk Reduction: - Currently uncontrolled, near goal LDL <70 - Recommend to recheck lipids with next labs.   Heart Failure: - Currently  opportunity for optimization - SGLT2 as above. Follow up with cardiology as scheduled.    Follow Up Plan: phone call in ~ 5 weeks  Catie TClearance Coots, PharmD, BCACP, CPP Clinical Pharmacist [facility] [phone]

## 2023-04-20 NOTE — Telephone Encounter (Signed)
.   1. Has your device fired? no  2. Is you device beeping?   3. Are you experiencing draining or swelling at device site?   4. Are you calling to see if we received your device transmission? Patient says device is not working correct  5. Have you passed out? no   Please route to Device Clinic Pool

## 2023-04-20 NOTE — Telephone Encounter (Signed)
Pt contacted by EP device CMA.  Will request assistance from [employer] [facility] [facility] [employer] industry rep with device monitor.  Continue to monitor for resolution.

## 2023-04-21 ENCOUNTER — Telehealth: Payer: Self-pay

## 2023-04-21 NOTE — Telephone Encounter (Signed)
error 

## 2023-04-22 ENCOUNTER — Other Ambulatory Visit: Payer: Self-pay | Admitting: Internal Medicine

## 2023-04-22 ENCOUNTER — Ambulatory Visit (INDEPENDENT_AMBULATORY_CARE_PROVIDER_SITE_OTHER): Payer: Medicare Other

## 2023-04-22 DIAGNOSIS — I428 Other cardiomyopathies: Secondary | ICD-10-CM | POA: Diagnosis not present

## 2023-04-22 DIAGNOSIS — I5022 Chronic systolic (congestive) heart failure: Secondary | ICD-10-CM

## 2023-04-22 NOTE — Addendum Note (Signed)
Addended by: Nilda Simmer T on: 04/22/2023 10:40 AM   Modules accepted: Orders

## 2023-04-27 ENCOUNTER — Ambulatory Visit: Payer: Medicare Other | Admitting: Internal Medicine

## 2023-04-27 ENCOUNTER — Encounter: Payer: Self-pay | Admitting: Internal Medicine

## 2023-04-27 VITALS — BP 130/80 | HR 73 | Temp 98.5°F | Ht 64.0 in | Wt 242.5 lb

## 2023-04-27 DIAGNOSIS — R109 Unspecified abdominal pain: Secondary | ICD-10-CM | POA: Diagnosis not present

## 2023-04-27 DIAGNOSIS — N3000 Acute cystitis without hematuria: Secondary | ICD-10-CM | POA: Diagnosis not present

## 2023-04-27 DIAGNOSIS — Z794 Long term (current) use of insulin: Secondary | ICD-10-CM | POA: Diagnosis not present

## 2023-04-27 DIAGNOSIS — E1122 Type 2 diabetes mellitus with diabetic chronic kidney disease: Secondary | ICD-10-CM

## 2023-04-27 DIAGNOSIS — N1832 Chronic kidney disease, stage 3b: Secondary | ICD-10-CM

## 2023-04-27 DIAGNOSIS — E66813 Obesity, class 3: Secondary | ICD-10-CM

## 2023-04-27 LAB — POCT URINALYSIS DIP (CLINITEK)
Bilirubin, UA: NEGATIVE
Blood, UA: NEGATIVE
Glucose, UA: 250 mg/dL — AB
Ketones, POC UA: NEGATIVE mg/dL
Nitrite, UA: NEGATIVE
POC PROTEIN,UA: NEGATIVE
Spec Grav, UA: 1.015 (ref 1.010–1.025)
Urobilinogen, UA: 0.2 U/dL
pH, UA: 6 (ref 5.0–8.0)

## 2023-04-27 MED ORDER — CEPHALEXIN 500 MG PO CAPS: ORAL_CAPSULE | ORAL | 0 refills | Status: DC

## 2023-04-27 NOTE — Progress Notes (Signed)
Madelaine Bhat, CMA,acting as a scribe for [name], MD.,have documented all relevant documentation on the behalf of [name], MD,as directed by  [name], MD while in the presence of [name], MD.  Subjective:  Patient ID: Yvonne Miller , female    DOB: 12-10-1950 , 72 y.o.   MRN: [id number]  Chief Complaint  Patient presents with   Dysuria    HPI  Patient presents today for further evaluation of abdominal pressure, frequent urine, and burning. Patient reports it first started last Thursday. She is not sure what triggered her sx. She denies having any vaginal discharge.   Urinary Tract Infection  This is a new problem. The current episode started in the past 7 days. The problem has been gradually worsening. The quality of the pain is described as burning. The pain is mild. There has been no fever. Associated symptoms include frequency and urgency.     Past Medical History:  Diagnosis Date   AICD (automatic cardioverter/defibrillator) present    Arthritis    Asthma    Carpal tunnel syndrome, bilateral    CKD (chronic kidney disease)    Diabetes mellitus    Encounter for assessment of implantable cardioverter-defibrillator (ICD)    GERD (gastroesophageal reflux disease)    Hyperlipemia    Hypertension    Obesity    morbid   Sleep apnea    wears CPAP set at 12   Wears glasses      Family History  Problem Relation Age of Onset   Hypertension Mother    Stroke Mother    Heart disease Father    Heart attack Sister    Heart attack Brother      Current Outpatient Medications:    ACCU-CHEK AVIVA PLUS test strip, USE TO CHECK BLOOD SUGAR 4 TIMES A DAY, Disp: 200 strip, Rfl: 11   acetaminophen (TYLENOL) 500 MG tablet, Take 500 mg by mouth every 6 (six) hours as needed for moderate pain., Disp: , Rfl:    Blood Glucose Monitoring Suppl (ACCU-CHEK GUIDE ME) w/Device KIT, USE TO CHECK BLOOD SUGAR AS DIRECTED BY THE PCP TEAM, Disp: 2 kit, Rfl: 2    carvedilol (COREG) 25 MG tablet, TAKE 1 AND 1/2 TABLETS BY MOUTH  TWICE DAILY, Disp: 300 tablet, Rfl: 2   cephALEXin (KEFLEX) 500 MG capsule, One tab po twice daily x 7 days, Disp: 14 capsule, Rfl: 0   Cholecalciferol (VITAMIN D) 50 MCG (2000 UT) tablet, Take 2,000 Units by mouth daily., Disp: , Rfl:    Chromium 1000 MCG TABS, Take 1,000 mcg by mouth daily. , Disp: , Rfl:    Cinnamon 500 MG capsule, Take 500 mg by mouth daily., Disp: , Rfl:    Continuous Glucose Sensor (FREESTYLE LIBRE 3 PLUS SENSOR) MISC, Change sensor every 15 days., Disp: 6 each, Rfl: 3   dapagliflozin propanediol (FARXIGA) 5 MG TABS tablet, Take 1 tablet (5 mg total) by mouth daily before breakfast., Disp: , Rfl:    famotidine (PEPCID) 20 MG tablet, One tab po daily as needed, Disp: 90 tablet, Rfl: 1   fluticasone (FLONASE) 50 MCG/ACT nasal spray, Place 1 spray into both nostrils daily. (Patient taking differently: Place 1 spray into both nostrils as needed for allergies.), Disp: 16 g, Rfl: 2   furosemide (LASIX) 40 MG tablet, TAKE 1 TABLET BY MOUTH EVERY DAY IN THE MORNING, Disp: 90 tablet, Rfl: 3   hydrALAZINE (APRESOLINE) 50 MG tablet, TAKE 1 TABLET BY  MOUTH TWICE  DAILY, Disp: 200 tablet, Rfl: 2   Insulin Lispro Prot & Lispro (HUMALOG MIX 75/25 KWIKPEN) (75-25) 100 UNIT/ML Kwikpen, DIAL AND INJECT 60 UNITS UNDER THE SKIN AT BEDTIME. MAX DAILY DOSE OF 60 UNITS, Disp: 75 mL, Rfl: 0   nystatin (NYSTATIN) powder, Apply topically 3 (three) times daily as needed. As needed, Disp: 60 g, Rfl: 2   Omega-3 Fatty Acids (FISH OIL) 500 MG CAPS, Take 500 mg by mouth daily., Disp: , Rfl:    omeprazole (PRILOSEC) 40 MG capsule, TAKE 1 CAPSULE BY MOUTH DAILY  BEFORE BREAKFAST, Disp: 90 capsule, Rfl: 3   pravastatin (PRAVACHOL) 40 MG tablet, Take 1 tablet (40 mg total) by mouth daily., Disp: 100 tablet, Rfl: 2   Probiotic Product (PROBIOTIC PO), Take by mouth., Disp: , Rfl:    pyridOXINE (VITAMIN B6) 25 MG tablet, Take 25 mg by mouth daily.,  Disp: , Rfl:    sacubitril-valsartan (ENTRESTO) 49-51 MG, Take 1 tablet by mouth 2 (two) times daily., Disp: 180 tablet, Rfl: 3   Semaglutide, 1 MG/DOSE, (OZEMPIC, 1 MG/DOSE,) 2 MG/1.5ML SOPN, Inject 1 mg into the skin once a week., Disp: 3 mL, Rfl: 12   vitamin B-12 (CYANOCOBALAMIN) 100 MCG tablet, Take 100 mcg by mouth daily., Disp: , Rfl:    VYZULTA 0.024 % SOLN, Apply 1-2 drops to eye daily., Disp: , Rfl:    albuterol (PROVENTIL HFA;VENTOLIN HFA) 108 (90 Base) MCG/ACT inhaler, Inhale 2 puffs into the lungs every 4 (four) hours as needed for wheezing or shortness of breath.  (Patient not taking: Reported on 04/20/2023), Disp: , Rfl:    Allergies  Allergen Reactions   Other Other (See Comments)    NO BLOOD   . Jehovah witness     Review of Systems  Constitutional: Negative.   Respiratory: Negative.    Cardiovascular: Negative.   Gastrointestinal: Negative.   Genitourinary:  Positive for dysuria, frequency and urgency.  Neurological: Negative.   Psychiatric/Behavioral: Negative.       Today's Vitals   04/27/23 1121  BP: 130/80  Pulse: 73  Temp: 98.5 F (36.9 C)  TempSrc: Oral  Weight: 242 lb 8 oz (110 kg)  Height: 5\' 4"  (1.626 m)  PainSc: 0-No pain   Body mass index is 41.63 kg/m.  Wt Readings from Last 3 Encounters:  04/27/23 242 lb 8 oz (110 kg)  03/10/23 246 lb 6.4 oz (111.8 kg)  02/10/23 251 lb 6.4 oz (114 kg)    The 10-year ASCVD risk score (Arnett DK, et al., 2019) is: 24.6%   Values used to calculate the score:     Age: 19 years     Sex: Female     Is Non-Hispanic African American: Yes     Diabetic: Yes     Tobacco smoker: No     Systolic Blood Pressure: 130 mmHg     Is BP treated: Yes     HDL Cholesterol: 50 mg/dL     Total Cholesterol: 155 mg/dL  Objective:  Physical Exam Vitals and nursing note reviewed.  Constitutional:      Appearance: Normal appearance. She is obese.  HENT:     Head: Normocephalic and atraumatic.  Eyes:     Extraocular  Movements: Extraocular movements intact.  Cardiovascular:     Rate and Rhythm: Normal rate and regular rhythm.     Heart sounds: Normal heart sounds.  Pulmonary:     Effort: Pulmonary effort is normal.     Breath sounds:  Normal breath sounds.  Abdominal:     Comments: Neg suprapubic tenderness  Musculoskeletal:     Cervical back: Normal range of motion.  Skin:    General: Skin is warm.  Neurological:     General: No focal deficit present.     Mental Status: She is alert.  Psychiatric:        Mood and Affect: Mood normal.        Behavior: Behavior normal.         Assessment And Plan:  Acute cystitis without hematuria Assessment & Plan: Urinalysis performed, results pos for UTI. I will send rx Keflex 500mg  twice daily x 7 days, dose adjusted for renal function. She is encouraged to take full course.   Orders: -     POCT URINALYSIS DIP (CLINITEK) -     Cephalexin; One tab po twice daily x 7 days  Dispense: 14 capsule; Refill: 0  Type 2 diabetes mellitus with stage 3b chronic kidney disease, with long-term current use of insulin (HCC) Assessment & Plan: Chronic, now on Ozempic. Again, CKD status was considered for abx dosing.      Return if symptoms worsen or fail to improve.  Patient was given opportunity to ask questions. Patient verbalized understanding of the plan and was able to repeat key elements of the plan. All questions were answered to their satisfaction.    I, [name], MD, have reviewed all documentation for this visit. The documentation on 05/01/23 for the exam, diagnosis, procedures, and orders are all accurate and complete.    IF YOU HAVE BEEN REFERRED TO A SPECIALIST, IT MAY TAKE 1-2 WEEKS TO SCHEDULE/PROCESS THE REFERRAL. IF YOU HAVE NOT HEARD FROM US/SPECIALIST IN TWO WEEKS, PLEASE GIVE [place] A CALL AT [phone].

## 2023-04-29 LAB — CUP PACEART REMOTE DEVICE CHECK
Battery Remaining Longevity: 7 mo
Battery Remaining Percentage: 10 %
Brady Statistic RA Percent Paced: 2 %
Brady Statistic RV Percent Paced: 100 %
Date Time Interrogation Session: 20241224112900
HighPow Impedance: 84 Ohm
Implantable Lead Connection Status: 753985
Implantable Lead Connection Status: 753985
Implantable Lead Connection Status: 753985
Implantable Lead Location: 753858
Implantable Lead Location: 753859
Implantable Lead Location: 753860
Implantable Lead Model: 4674
Implantable Lead Model: 5076
Implantable Lead Model: 692
Implantable Lead Serial Number: 501753
Implantable Lead Serial Number: 503110
Lead Channel Impedance Value: 1294 Ohm
Lead Channel Impedance Value: 372 Ohm
Lead Channel Impedance Value: 859 Ohm
Lead Channel Pacing Threshold Amplitude: 0.6 V
Lead Channel Pacing Threshold Amplitude: 0.8 V
Lead Channel Pacing Threshold Amplitude: 2.4 V
Lead Channel Pacing Threshold Pulse Width: 0.5 ms
Lead Channel Pacing Threshold Pulse Width: 0.5 ms
Lead Channel Pacing Threshold Pulse Width: 0.5 ms
Lead Channel Setting Pacing Amplitude: 2 V
Lead Channel Setting Pacing Amplitude: 2 V
Lead Channel Setting Pacing Amplitude: 3.5 V
Lead Channel Setting Pacing Pulse Width: 0.5 ms
Lead Channel Setting Pacing Pulse Width: 0.5 ms
Lead Channel Setting Sensing Sensitivity: 0.6 mV
Lead Channel Setting Sensing Sensitivity: 1 mV
Pulse Gen Serial Number: 133503

## 2023-05-01 ENCOUNTER — Encounter: Payer: Self-pay | Admitting: Internal Medicine

## 2023-05-01 DIAGNOSIS — N3000 Acute cystitis without hematuria: Secondary | ICD-10-CM | POA: Insufficient documentation

## 2023-05-01 LAB — URINE CULTURE

## 2023-05-01 NOTE — Assessment & Plan Note (Signed)
Chronic, now on Ozempic. Again, CKD status was considered for abx dosing.

## 2023-05-01 NOTE — Patient Instructions (Signed)
Urinary Tract Infection, Adult A urinary tract infection (UTI) is an infection of any part of the urinary tract. The urinary tract includes: The kidneys. The ureters. The bladder. The urethra. These organs make, store, and get rid of pee (urine) in the body. What are the causes? This infection is caused by germs (bacteria) in your genital area. These germs grow and cause swelling (inflammation) of your urinary tract. What increases the risk? The following factors may make you more likely to develop this condition: Using a small, thin tube (catheter) to drain pee. Not being able to control when you pee or poop (incontinence). Being female. If you are female, these things can increase the risk: Using these methods to prevent pregnancy: A medicine that kills sperm (spermicide). A device that blocks sperm (diaphragm). Having low levels of a female hormone (estrogen). Being pregnant. You are more likely to develop this condition if: You have genes that add to your risk. You are sexually active. You take antibiotic medicines. You have trouble peeing because of: A prostate that is bigger than normal, if you are female. A blockage in the part of your body that drains pee from the bladder. A kidney stone. A nerve condition that affects your bladder. Not getting enough to drink. Not peeing often enough. You have other conditions, such as: Diabetes. A weak disease-fighting system (immune system). Sickle cell disease. Gout. Injury of the spine. What are the signs or symptoms? Symptoms of this condition include: Needing to pee right away. Peeing small amounts often. Pain or burning when peeing. Blood in the pee. Pee that smells bad or not like normal. Trouble peeing. Pee that is cloudy. Fluid coming from the vagina, if you are female. Pain in the belly or lower back. Other symptoms include: Vomiting. Not feeling hungry. Feeling mixed up (confused). This may be the first symptom in  older adults. Being tired and grouchy (irritable). A fever. Watery poop (diarrhea). How is this treated? Taking antibiotic medicine. Taking other medicines. Drinking enough water. In some cases, you may need to see a specialist. Follow these instructions at home:  Medicines Take over-the-counter and prescription medicines only as told by your doctor. If you were prescribed an antibiotic medicine, take it as told by your doctor. Do not stop taking it even if you start to feel better. General instructions Make sure you: Pee until your bladder is empty. Do not hold pee for a long time. Empty your bladder after sex. Wipe from front to back after peeing or pooping if you are a female. Use each tissue one time when you wipe. Drink enough fluid to keep your pee pale yellow. Keep all follow-up visits. Contact a doctor if: You do not get better after 1-2 days. Your symptoms go away and then come back. Get help right away if: You have very bad back pain. You have very bad pain in your lower belly. You have a fever. You have chills. You feeling like you will vomit or you vomit. Summary A urinary tract infection (UTI) is an infection of any part of the urinary tract. This condition is caused by germs in your genital area. There are many risk factors for a UTI. Treatment includes antibiotic medicines. Drink enough fluid to keep your pee pale yellow. This information is not intended to replace advice given to you by your health care provider. Make sure you discuss any questions you have with your health care provider. Document Revised: 11/27/2019 Document Reviewed: 12/02/2019 Elsevier Patient Education    2024 Elsevier Inc.  

## 2023-05-01 NOTE — Assessment & Plan Note (Signed)
Urinalysis performed, results pos for UTI. I will send rx Keflex 500mg  twice daily x 7 days, dose adjusted for renal function. She is encouraged to take full course.

## 2023-05-11 ENCOUNTER — Other Ambulatory Visit (INDEPENDENT_AMBULATORY_CARE_PROVIDER_SITE_OTHER): Payer: Medicare Other | Admitting: Pharmacist

## 2023-05-11 ENCOUNTER — Ambulatory Visit: Payer: Medicare Other | Admitting: Internal Medicine

## 2023-05-11 DIAGNOSIS — I5032 Chronic diastolic (congestive) heart failure: Secondary | ICD-10-CM

## 2023-05-11 DIAGNOSIS — I130 Hypertensive heart and chronic kidney disease with heart failure and stage 1 through stage 4 chronic kidney disease, or unspecified chronic kidney disease: Secondary | ICD-10-CM

## 2023-05-11 MED ORDER — DAPAGLIFLOZIN PROPANEDIOL 10 MG PO TABS: 10.0000 mg | ORAL_TABLET | Freq: Every day | ORAL | 2 refills | Status: DC

## 2023-05-11 NOTE — Progress Notes (Signed)
 05/11/2023 Name: Yvonne Miller MRN: [id number] DOB: 1950/08/01  Chief Complaint  Patient presents with   Medication Management   Diabetes    Yvonne Miller is a 73 y.o. year old female who presented for a telephone visit.   They were referred to the pharmacist by their PCP for assistance in managing diabetes.    Subjective:  Care Team: Primary Care Provider: Jarold Medici, MD ; Next Scheduled Visit: 07/07/23  Medication Access/Adherence  Current Pharmacy:  [facility] - [place], [place] - [address] [facility] [address] [place] [address] [place] [address] Phone: [phone] Fax: [phone]  [id number] [place], [place] - [address] AT [address]OSS [address] [place] [address] Phone: [phone] Fax: [phone]  [facility] - [place], [place] - [address] [place] [address] Phone: [phone] Fax: [phone]  [facility] [address] [place] - [address] [place] [place] [address] Phone: [phone] Fax: [phone]  [facility] - [place], [place] - [address] [place] [address] Phone: [phone] Fax: [phone]   Patient reports affordability concerns with their medications: No  Patient reports access/transportation concerns to their pharmacy: No  Patient reports adherence concerns with their medications:  No     Diabetes:  Current medications: Ozempic  1 mg weekly, Humalog  70/30 - only taking ~10-25 units after supper, is not needing insulin  at other times; Farxiga  5 mg daily  Medications tried in the past: renal function inappropriate for metformin  initiation  Likes using CGM.   Date of Download: 04/28/23-05/11/23 % Time CGM is active: 97% Average Glucose: 120 mg/dL Glucose Management Indicator: 6.2  Glucose Variability:  20.2 (goal <36%) Time in Goal:  - Time in range 70-180: 98% - Time above range: 2% - Time below range: 0%  Patient reports hypoglycemic s/sx - a few episodes of hypoglycemia, notes CGM has woken her up.   Current meal patterns:  - Supper is largest meal  Current medication access support: patient assistance for mixed insulin , Ozempic ;  Heart Failure (EF 35-40% 01/2023):   Current medications:  ACEi/ARB/ARNI: Entresto  49/51 mg twice daily SGLT2i: none, consider moving forward Beta blocker: carvedilol  37.5 mg twice daily Mineralocorticoid Receptor Antagonist: none Diuretic regimen: furosemide  40 mg daily    Current medication access support: [employer] for Entresto , Farxiga     Hyperlipidemia/ASCVD Risk Reduction  Current lipid lowering medications: pravastatin  40 mg daily   Objective:  Lab Results  Component Value Date   HGBA1C 6.8 (H) 03/10/2023    Lab Results  Component Value Date   CREATININE 1.21 (H) 02/10/2023   BUN 21 02/10/2023   NA 141 02/10/2023   K 4.3 02/10/2023   CL 105 02/10/2023   CO2 27 02/10/2023    Lab Results  Component Value Date   CHOL 155 10/30/2022   HDL 50 10/30/2022   LDLCALC 76 10/30/2022   TRIG 170 (H) 10/30/2022   CHOLHDL 3.1 10/30/2022    Medications Reviewed Today     Reviewed by Rudy Dorothyann DASEN, RPH-CPP (Pharmacist) on 05/11/23 at 1107  Med List Status: <None>   Medication Order Taking? Sig Documenting Provider Last Dose Status Informant  ACCU-CHEK AVIVA PLUS test strip [id number]  USE TO CHECK BLOOD SUGAR 4 TIMES A MICHAE Jarold Medici, MD  Active   acetaminophen  (TYLENOL ) 500 MG tablet [id number]  Take 500 mg by mouth every 6 (six) hours as needed for moderate pain. [provider]  Active   albuterol  (PROVENTIL  HFA;VENTOLIN  HFA) 108 (90 Base) MCG/ACT inhaler [id number]  Inhale 2 puffs into the lungs every 4 (four) hours as needed for wheezing or shortness of breath.   Patient not taking: Reported on 04/20/2023    [provider]  Active Self  Blood Glucose Monitoring Suppl (ACCU-CHEK GUIDE ME) w/Device KIT [id number]  USE TO CHECK BLOOD SUGAR AS DIRECTED BY THE PCP TEAM Jarold Medici, MD  Active   carvedilol  (COREG ) 25 MG tablet [id number]  TAKE 1 AND 1/2 TABLETS BY MOUTH  TWICE DAILY Ladona Heinz, MD  Active     Discontinued 05/11/23 1040 (Completed Course)   Cholecalciferol  (VITAMIN D ) 50 MCG (2000 UT) tablet [id number]  Take 2,000 Units by mouth daily. [provider]  Active   Chromium 1000 MCG TABS [id number]  Take 1,000 mcg by mouth daily.  [provider]  Active   Cinnamon 500 MG capsule [id number]  Take 500 mg by mouth daily. [provider]  Active Self           Med Note ROSANNE, APRIL   Thu Oct 21, 2018  8:17 AM)    Continuous Glucose Sensor (FREESTYLE LIBRE 3 PLUS SENSOR) MISC [id number]  Change sensor every 15 days. Jarold Medici, MD  Active   dapagliflozin  propanediol (FARXIGA ) 5 MG TABS tablet [id number] Yes Take 1 tablet (5 mg total) by mouth daily before breakfast. Jarold Medici, MD Taking Active   famotidine  (PEPCID ) 20 MG tablet [id number]  One tab po daily as needed Jarold Medici, MD  Active   fluticasone  (FLONASE ) 50 MCG/ACT nasal spray [id number]  Place 1 spray into both nostrils daily.  Patient taking differently: Place 1 spray into both nostrils as needed for allergies.   Jarold Medici, MD  Active   furosemide  (LASIX ) 40 MG tablet [id number]  TAKE 1 TABLET BY MOUTH EVERY DAY IN THE ERMALINDA Ladona Heinz, MD  Active   hydrALAZINE  (APRESOLINE ) 50 MG tablet [id number]  TAKE 1 TABLET BY MOUTH TWICE  DAILY Ladona Heinz, MD  Active   Discontinued 05/11/23 1106            Med Note (Kishia Shackett T   Mon May 11, 2023 10:46 AM) Taking 25 units at night   nystatin  (NYSTATIN ) powder [id number]  Apply topically 3 (three) times daily as needed. As needed Jarold Medici, MD  Active   Omega-3 Fatty Acids (FISH OIL) 500 MG CAPS [id number]  Take 500 mg by mouth daily.  [provider]  Active   omeprazole  (PRILOSEC) 40 MG capsule [id number]  TAKE 1 CAPSULE BY MOUTH DAILY  BEFORE OFILIA Ladona Heinz, MD  Active   pravastatin  (PRAVACHOL ) 40 MG tablet [id number]  Take 1 tablet (40 mg total) by mouth daily. Jarold Medici, MD  Active   Probiotic Product (PROBIOTIC PO) [id number]  Take by mouth. [provider]  Active   pyridOXINE (VITAMIN B6) 25 MG tablet [id number]  Take 25 mg by mouth daily. [provider]  Active   sacubitril-valsartan  (ENTRESTO ) 49-51 MG [id number]  Take 1 tablet by mouth 2 (two) times daily. Ladona Heinz, MD  Active   Semaglutide , 1 MG/DOSE, (OZEMPIC , 1 MG/DOSE,) 2 MG/1.5ML SOPN [id number]  Inject 1 mg into the skin once a week. Jarold Medici, MD  Active   vitamin B-12 (CYANOCOBALAMIN) 100 MCG tablet [id number]  Take 100 mcg by mouth daily. [provider]  Active   VYZULTA 0.024 % SOLN [id number]  Apply 1-2 drops to eye daily. [provider]  Active               Assessment/Plan:   Diabetes: - Currently controlled per CGM readings  - Recommend to stop Humalog  mixed insulin . Increase Ozempic  to 2 mg weekly. She will use supply of 1 mg pens and we will order 2 mg pen strength - Discussed Farxiga . Patient is unsure if UTI preceded starting Farxiga , so she would like to continue therapy. We discussed that if she develops another UTI or yeast infection, we will consider stopping Farxiga . Will complete patient assistance application at this time. She will come by later this week to sign her portion - Recommend to check glucose using CGM  Hypertension: - Currently controlled - Reviewed appropriate blood pressure monitoring technique and reviewed goal blood pressure. Recommended to check home blood pressure and heart rate periodically - Recommend to continue current regimen at this time  Hyperlipidemia/ASCVD Risk Reduction: - Currently controlled.  - Recommend to continue current regimen at this  time.     Follow Up Plan: follow up with pharmacist in ~ 4 weeks  Catie IVAR Centers, PharmD, BCACP, CPP Clinical Pharmacist [facility] [phone]

## 2023-05-18 ENCOUNTER — Other Ambulatory Visit: Payer: Self-pay | Admitting: Internal Medicine

## 2023-05-28 ENCOUNTER — Encounter: Payer: Self-pay | Admitting: Internal Medicine

## 2023-05-28 ENCOUNTER — Other Ambulatory Visit: Payer: Self-pay | Admitting: Pharmacist

## 2023-05-28 MED ORDER — SEMAGLUTIDE (2 MG/DOSE) 8 MG/3ML ~~LOC~~ SOPN: 2.0000 mg | PEN_INJECTOR | SUBCUTANEOUS | 2 refills | Status: DC

## 2023-05-28 NOTE — Progress Notes (Signed)
Care Coordination Call  Received call from patient. She has not received notice of Ozempic arriving yet. Provided her information to call [employer] to follow up on status of shipment. If shipment is not in process, she can request [facility] day [facility]. [facility]   Patient will come by the office to sign off on Farxiga application.   [name], PharmD, BCACP, CPP Clinical Pharmacist [facility] [phone]

## 2023-05-29 NOTE — Addendum Note (Signed)
Addended by: Geralyn Flash D on: 05/29/2023 04:09 PM   Modules accepted: Orders

## 2023-05-29 NOTE — Progress Notes (Signed)
Remote ICD transmission.

## 2023-06-05 ENCOUNTER — Other Ambulatory Visit: Payer: Self-pay | Admitting: Cardiology

## 2023-06-05 DIAGNOSIS — K219 Gastro-esophageal reflux disease without esophagitis: Secondary | ICD-10-CM

## 2023-06-05 DIAGNOSIS — E119 Type 2 diabetes mellitus without complications: Secondary | ICD-10-CM | POA: Diagnosis not present

## 2023-06-05 NOTE — Telephone Encounter (Signed)
Yvonne Miller, can you see if this is appropriate and do the needful

## 2023-06-09 ENCOUNTER — Other Ambulatory Visit: Payer: Self-pay

## 2023-06-09 NOTE — Progress Notes (Addendum)
 06/09/2023 Name: Yvonne Miller MRN: [id number] DOB: 20-Feb-1951  Chief Complaint  Patient presents with   Diabetes   Medication access    Yvonne Miller is a 73 y.o. year old female who presented for a telephone visit.   They were referred to the pharmacist by their PCP for assistance in managing diabetes. PMH includes cardiomyopathy (s/p ICD, EF 35-40%), T2DM, recurrent UTI, HLD, HTN, obesity.    Subjective:  Patient is doing well. She was able to pick up a one month supply of Ozempic  2 mg pens utilizing a voucher from [employer] as her patient assistance medication was not processed/shipped yet. [facility]  Care Team: Primary Care Provider: Jarold Medici, MD ; Next Scheduled Visit: 07/07/23  Medication Access/Adherence  Current Pharmacy:  [facility]) - [place], [place] - [address] [facility] [address] [place] [address] [place] [address] Phone: [phone] Fax: [phone]  [id number] [place], [place] - [address] [place] [address] Phone: [phone] Fax: [phone]  [facility] - [place], [place] - [address] [place] [address] Phone: [phone] Fax: [phone]  [facility] [address] [place] - [address] [place] [address] Phone: [phone] Fax: [phone]  [facility] - [place], [place] - [address] [place] [address] Phone: [phone] Fax: [phone]   Patient reports affordability concerns with their medications: Yes - enrolled in [employer] HF fund and NovoCares (Ozempic ), [name] (Humalog  75-25) Patient reports access/transportation concerns to their pharmacy: No  Patient reports adherence concerns with their medications:  No     Diabetes:  Current  medications: Ozempic  2 mg subcutaneous weekly, Humalog  75/25 - 10 units in the morning and evening as needed (only takes if BG > 130). Farxiga  10 mg PO daily  Medications tried in the past: renal function inappropriate for metformin  initiation  Denies AE since increasing dose of Ozempic . No N/V, abdominal pain. She did try to discontinue insulin  when she increased Ozempic  to 2 mg, but noticed that her BG were slowly creeping up so she started using  Humalog  75-25 10 units BID (depending on her BG). Had noticed GMI increased from 6.2% to 6.8% within 1 week of not using insulin .   She expresses concern that she is not enrolled in Farxiga  PAP, because she is concerned about using up her $10,000 [employer] [place] if she uses it for Entresto  and Farxiga . Confirmed that copay for Farxiga  without the grant is $47 (do not think she will reach $10,000 limit) and made sure that pharmacy had [employer] grant information.   Likes using CGM.   Date of Download: 05/27/23-06/09/23 % Time CGM is active: 97% Average Glucose: 138 mg/dL Glucose Management Indicator: 6.6  Glucose Variability: 20.4 (goal <36%) Time in Goal:  - Time in range 70-180: 92% - Time above range: 8% - Time below range: 0%  Patient denies s/sx of hypoglycemia. No lows on CGM report.   Current meal patterns:  - Supper is largest meal  Current medication access support: patient assistance for mixed insulin , Ozempic ;  Heart Failure (EF 35-40% 01/2023):   Current medications:  ACEi/ARB/ARNI: Entresto  49/51 mg twice daily SGLT2i: Farxiga  10 mg daily Beta blocker: carvedilol  37.5 mg twice daily Mineralocorticoid Receptor Antagonist: none Diuretic regimen: furosemide  40 mg daily  Current medication access support: [employer] for Entresto , Farxiga     Hyperlipidemia/ASCVD Risk Reduction  Current lipid lowering medications: pravastatin  40 mg daily   Objective:  Lab Results  Component Value Date   HGBA1C 6.8 (H) 03/10/2023     Lab Results  Component Value Date   CREATININE 1.21 (H) 02/10/2023   BUN 21 02/10/2023   NA 141 02/10/2023   K 4.3 02/10/2023   CL 105 02/10/2023   CO2 27 02/10/2023    Lab Results  Component Value Date   CHOL 155 10/30/2022   HDL 50 10/30/2022   LDLCALC 76 10/30/2022   TRIG 170 (H) 10/30/2022   CHOLHDL 3.1 10/30/2022    Medications Reviewed Today     Reviewed by Brinda Lorain SQUIBB, RPH (Pharmacist) on 06/09/23 at 1246  Med List Status: <None>   Medication Order Taking? Sig Documenting Provider Last Dose Status Informant  ACCU-CHEK AVIVA PLUS test strip [id number]  USE TO CHECK BLOOD SUGAR 4 TIMES A MICHAE Jarold Medici, MD  Active   acetaminophen  (TYLENOL ) 500 MG tablet [id number]  Take 500 mg by mouth every 6 (six) hours as needed for moderate pain. [provider]  Active   albuterol  (PROVENTIL  HFA;VENTOLIN  HFA) 108 (90 Base) MCG/ACT inhaler [id number]  Inhale 2 puffs into the lungs every 4 (four) hours as needed for wheezing or shortness of breath.   Patient not taking: Reported on 04/20/2023   [provider]  Active Self  Blood Glucose Monitoring Suppl (ACCU-CHEK GUIDE ME) w/Device KIT [id number]  USE TO CHECK BLOOD SUGAR AS DIRECTED BY THE PCP TEAM Jarold Medici, MD  Active   carvedilol  (COREG ) 25 MG tablet [id number]  TAKE 1 AND 1/2 TABLETS BY MOUTH  TWICE DAILY Ladona Heinz, MD  Active   Cholecalciferol  (VITAMIN D ) 50 MCG (2000 UT) tablet [id number]  Take 2,000 Units by mouth daily. [provider]  Active   Chromium 1000 MCG TABS [id number]  Take 1,000 mcg by mouth daily.  [provider]  Active   Cinnamon 500 MG capsule [id number]  Take 500 mg by mouth daily. [provider]  Active Self           Med Note ROSANNE, APRIL   Thu Oct 21, 2018  8:17 AM)    Continuous Glucose Sensor (FREESTYLE LIBRE 3 PLUS SENSOR) MISC [id number]  Change sensor every 15 days. Jarold Medici, MD  Active   dapagliflozin  propanediol (FARXIGA ) 10 MG TABS  tablet [id number] Yes Take 1 tablet (10 mg total) by mouth daily. Jarold Medici, MD Taking Active   famotidine  (PEPCID ) 20 MG tablet [id number]  TAKE 1 TABLET BY MOUTH DAILY AS  NEEDED Jarold Medici, MD  Active   fluticasone  (FLONASE ) 50 MCG/ACT nasal spray [id number]  Place 1 spray into both nostrils daily.  Patient taking differently: Place 1 spray into both nostrils as needed for allergies.   Jarold Medici, MD  Active   furosemide  (LASIX ) 40 MG tablet [id number]  TAKE 1 TABLET BY MOUTH EVERY DAY IN THE ERMALINDA Ladona Heinz, MD  Active   hydrALAZINE  (APRESOLINE ) 50 MG tablet [id number]  TAKE 1 TABLET BY MOUTH TWICE  DAILY Ladona Heinz, MD  Active   insulin  lispro protamine-lispro (HUMALOG  75/25 MIX) (75-25) 100 UNIT/ML SUSP injection [id number]  Inject 20 Units into the skin 2 (two) times daily with a meal. [provider]  Active   nystatin  (NYSTATIN ) powder [id number]  Apply topically 3 (three) times daily as needed. As needed Jarold Medici, MD  Active  Omega-3 Fatty Acids (FISH OIL) 500 MG CAPS [id number]  Take 500 mg by mouth daily. [provider]  Active   omeprazole  (PRILOSEC) 40 MG capsule [id number]  TAKE 1 CAPSULE BY MOUTH DAILY  BEFORE OFILIA Jarold Medici, MD  Active   pravastatin  (PRAVACHOL ) 40 MG tablet [id number]  Take 1 tablet (40 mg total) by mouth daily. Jarold Medici, MD  Active   Probiotic Product (PROBIOTIC PO) [id number]  Take by mouth. [provider]  Active   pyridOXINE (VITAMIN B6) 25 MG tablet [id number]  Take 25 mg by mouth daily. [provider]  Active   sacubitril-valsartan  (ENTRESTO ) 49-51 MG [id number] Yes Take 1 tablet by mouth 2 (two) times daily. Ladona Heinz, MD Taking Active   Semaglutide , 2 MG/DOSE, 8 MG/3ML SOPN [id number] Yes Inject 2 mg as directed once a week. Jarold Medici, MD Taking Active   vitamin B-12 (CYANOCOBALAMIN) 100 MCG tablet [id number]  Take 100 mcg by mouth daily. [provider]  Active   VYZULTA 0.024 %  SOLN [id number]  Apply 1-2 drops to eye daily. [provider]  Active               Assessment/Plan:   Diabetes: - Currently controlled per CGM readings - TIR above goal > 70% and last A1c 6.8% below goal < 7%. Patient without tolerability issues to GLP-1RA since increasing to max-strength Ozempic . No s/sx of hypoglycemia, though she did self-resume mixed insulin .  - Recommend to continue Ozempic  2 mg subcutaneous weekly - Recommend to continue Humalog  75-25 10 units BID in the morning and evening with meals.  Discussed with patient that her blood sugars would likely still be controlled within goal if she were to stop the insulin  given infrequent use and GMI increase only to 6.8% after stopping insulin  for one week. However, patient prefers to continue insulin  as needed. Discussed risk of hypoglycemia, but ok to continue as patient is not experiencing hypo at this time.  - Recommend to continue Farxiga  10 mg PO daily.  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 Medication access is not a problem at this time as she is enrolled in the [facility] grant for Farxiga  and Entresto , and NovoCares PAP is in process for Ozempic . Will ensure she has received Novo PAP medication at follow-up.  - Recommend to check glucose using CGM - [facility]  was going through [employer] - unable to confirm copay because medication was on order. Do not anticipate that patient has used $10,000 of funding through the [place] because copay without the grant was only $47.  Follow Up Plan: follow up with pharmacist in ~ 6 weeks  Lorain Baseman, PharmD PGY1 Pharmacy Resident

## 2023-06-12 DIAGNOSIS — H401131 Primary open-angle glaucoma, bilateral, mild stage: Secondary | ICD-10-CM | POA: Diagnosis not present

## 2023-06-19 DIAGNOSIS — H401132 Primary open-angle glaucoma, bilateral, moderate stage: Secondary | ICD-10-CM | POA: Diagnosis not present

## 2023-06-19 DIAGNOSIS — H2513 Age-related nuclear cataract, bilateral: Secondary | ICD-10-CM | POA: Diagnosis not present

## 2023-06-19 DIAGNOSIS — Z794 Long term (current) use of insulin: Secondary | ICD-10-CM | POA: Diagnosis not present

## 2023-06-19 DIAGNOSIS — H2511 Age-related nuclear cataract, right eye: Secondary | ICD-10-CM | POA: Diagnosis not present

## 2023-06-19 DIAGNOSIS — E119 Type 2 diabetes mellitus without complications: Secondary | ICD-10-CM | POA: Diagnosis not present

## 2023-06-25 ENCOUNTER — Other Ambulatory Visit (HOSPITAL_BASED_OUTPATIENT_CLINIC_OR_DEPARTMENT_OTHER): Payer: Self-pay

## 2023-06-25 DIAGNOSIS — J849 Interstitial pulmonary disease, unspecified: Secondary | ICD-10-CM

## 2023-06-30 ENCOUNTER — Ambulatory Visit (HOSPITAL_BASED_OUTPATIENT_CLINIC_OR_DEPARTMENT_OTHER): Payer: Medicare Other | Admitting: Internal Medicine

## 2023-06-30 DIAGNOSIS — J849 Interstitial pulmonary disease, unspecified: Secondary | ICD-10-CM | POA: Diagnosis not present

## 2023-06-30 LAB — PULMONARY FUNCTION TEST
DL/VA % pred: 115 %
DL/VA: 4.8 ml/min/mmHg/L
DLCO cor % pred: 92 %
DLCO cor: 17.43 ml/min/mmHg
DLCO unc % pred: 92 %
DLCO unc: 17.43 ml/min/mmHg
FEF 25-75 Post: 3.15 L/s
FEF 25-75 Pre: 2.74 L/s
FEF2575-%Change-Post: 15 %
FEF2575-%Pred-Post: 183 %
FEF2575-%Pred-Pre: 159 %
FEV1-%Change-Post: 2 %
FEV1-%Pred-Post: 95 %
FEV1-%Pred-Pre: 93 %
FEV1-Post: 2 L
FEV1-Pre: 1.96 L
FEV1FVC-%Change-Post: 0 %
FEV1FVC-%Pred-Pre: 117 %
FEV6-%Change-Post: 2 %
FEV6-%Pred-Post: 84 %
FEV6-%Pred-Pre: 83 %
FEV6-Post: 2.26 L
FEV6-Pre: 2.21 L
FEV6FVC-%Pred-Post: 105 %
FEV6FVC-%Pred-Pre: 105 %
FVC-%Change-Post: 2 %
FVC-%Pred-Post: 80 %
FVC-%Pred-Pre: 79 %
FVC-Post: 2.26 L
FVC-Pre: 2.21 L
Post FEV1/FVC ratio: 89 %
Post FEV6/FVC ratio: 100 %
Pre FEV1/FVC ratio: 89 %
Pre FEV6/FVC Ratio: 100 %
RV % pred: 77 %
RV: 1.72 L
TLC % pred: 82 %
TLC: 4.09 L

## 2023-06-30 NOTE — Progress Notes (Signed)
 Full PFT Performed Today

## 2023-06-30 NOTE — Patient Instructions (Signed)
 Full PFT Performed Today

## 2023-07-01 ENCOUNTER — Encounter: Payer: Self-pay | Admitting: Internal Medicine

## 2023-07-01 ENCOUNTER — Ambulatory Visit: Payer: Medicare Other | Admitting: Internal Medicine

## 2023-07-02 ENCOUNTER — Ambulatory Visit: Payer: Medicare Other | Admitting: Internal Medicine

## 2023-07-02 ENCOUNTER — Encounter: Payer: Self-pay | Admitting: Internal Medicine

## 2023-07-02 VITALS — BP 111/72 | HR 76 | Temp 97.8°F | Ht 64.0 in | Wt 236.4 lb

## 2023-07-02 DIAGNOSIS — R768 Other specified abnormal immunological findings in serum: Secondary | ICD-10-CM | POA: Diagnosis not present

## 2023-07-02 DIAGNOSIS — J849 Interstitial pulmonary disease, unspecified: Secondary | ICD-10-CM | POA: Diagnosis not present

## 2023-07-02 DIAGNOSIS — R9389 Abnormal findings on diagnostic imaging of other specified body structures: Secondary | ICD-10-CM | POA: Diagnosis not present

## 2023-07-02 NOTE — Patient Instructions (Addendum)
 ICD-10-CM   1. Abnormal CT of the chest  R93.89     2. ILD (interstitial lung disease) (HCC)  J84.9     3. ANA positive  R76.8        PCP [name], MD Is concerned for a condition called interstitial lung disease but in my view you have some interstitial lung abnormalities [ILA].  .  In my view it has been there since 2021 and it is stable.  Unclear if it was that in 2011 or not  ANA positive but unclear significance  Glad you got rid of feather pillow and stopped fish oil  PFT normal suggesting burden of problem is very very very mild   Plan - do HRCT in Oct 2025   Follow-up - Return in Nov 2025; 15 min visti to see APP after CT chest

## 2023-07-02 NOTE — Progress Notes (Signed)
 OV 02/10/2023  Subjective:  Patient ID: Yvonne Miller, female , DOB: November 02, 1950 , age 73 y.o. , MRN: [id number] , ADDRESS: [address] [place] [address] PCP [name], MD Patient Care Team: [name], MD as PCP - General (Internal Medicine) Alden Hipp, RPH-CPP (Pharmacist)  This Provider for this visit: Treatment Team:  Attending Provider: Kalman Shan, MD    02/10/2023 -   Chief Complaint  Patient presents with   Consult    Ct 8/5 f/u      HPI Yvonne Miller 73 y.o. -new consult referred by Dr. Velna Hatchet for concern for interstitial lung disease on CT scan of the chest August 2024.  Review of the external medical records indicate that in June 2024 she saw PCP.  In 2021 there was a CT chest that showed 4 mm lung nodule therefore PCP did a CT chest and this showed interstitial lung abnormalities associated with groundglass opacities.  Therefore referred here.  Patient herself tells me she is not sure why she is here she is stable she has very minimal to no shortness of breath.  She suffers from morbid obesity and then she has had dyspnea on exertion and then at some point she did have a pacemaker/defibrillator placed.  After this the dyspnea resolved.  And she is been stable like this for at least since 2015.  Therefore she is really surprised about the referral here.  There is no cough or chest pain or wheezing.  Review of the visual images indicate that in 2011 she did have a CT abdomen.  Maybe this is mention of groundglass opacities in the right lung base anteriorly but is definitely present in the CT scan of the abdomen in 2021 along with posterior potential reticulation and anterior groundglass.  These findings are very subtle.  In the latest August 2024 CT chest in my personal opinion it is essentially unchanged.  There is no hospitalizations or ER visits  She does admit to acid reflux she is on fish oil but she believes acid  reflux is under control with omeprazole.  She does not have any autoimmune disease.  She does have a feather pillow or down pillow.  Of note she did see Dr. Jacinto Halim recently was started on Entresto.  She is asking for a chemistry panel to be checked so with her Sherryll Burger can be increased.    OV 07/02/2023  Subjective:  Patient ID: Yvonne Miller, female , DOB: May 25, 1950 , age 41 y.o. , MRN: [id number] , ADDRESS: [address] [place] [address] PCP [name], MD Patient Care Team: [name], MD as PCP - General (Internal Medicine)  This Provider for this visit: Treatment Team:  Attending Provider: Kalman Shan, MD  For interstitial lung abnormalities [referred for interstitial lung disease].  07/02/2023 -   Chief Complaint  Patient presents with   Follow-up     HPI [name] 73 y.o. -presents for follow-up.  In the interim she is gotten rid of feather pillow.  She also stopped using fish oil.  She continues to remain minimally symptomatic having shortness of breath while climbing stairs.  This is still an improved position since having her defibrillator/pacer placed.  She continues to remain morbidly obese.  There is no wheezing or cough.  She did autoimmune workup and ANA is 1: 160 but she denies any oral ulcers, photophobia, rash, [place] or any significant arthritis beyond osteoarthritis.  She  had pulmonary function test and this is normal.  Again visualized the CT chest and short of the mild ILD findings.   SYMPTOM SCALE - ILD 07/02/2023  Current weight   O2 use ra  Shortness of Breath 0 -> 5 scale with 5 being worst (score 6 If unable to do)  At rest 1  Simple tasks - showers, clothes change, eating, shaving 0  Household (dishes, doing bed, laundry) 0  Shopping 2  Walking level at own pace 1  Walking up Stairs 5  Total (30-36) Dyspnea Score 8  How bad is your cough? 1  How bad is your fatigue 0  How bad is nausea 0  How bad is  vomiting?  0  How bad is diarrhea? 00  How bad is anxiety? 0  How bad is depression 0  Any chronic pain - if so where and how bad 0        Latest Reference Range & Units 02/10/23 14:07  IgE (Immunoglobulin E), Serum <OR=114 kU/L 52  Anti Nuclear Antibody (ANA) NEGATIVE  POSITIVE !  ANA Pattern 1  Nuclear, Homogeneous !  ANA Titer 1 titer 1:160 (H)  Cyclic Citrullin Peptide Ab UNITS <16  SSA (Ro) (ENA) Antibody, IgG <1.0 NEG AI <1.0 NEG  SSB (La) (ENA) Antibody, IgG <1.0 NEG AI <1.0 NEG  Scleroderma (Scl-70) (ENA) Antibody, IgG <1.0 NEG AI <1.0 NEG  !: Data is abnormal (H): Data is abnormally high  PFT     Latest Ref Rng & Units 06/30/2023    8:04 AM  PFT Results  FVC-Pre L 2.21  P  FVC-Predicted Pre % 79  P  FVC-Post L 2.26  P  FVC-Predicted Post % 80  P  Pre FEV1/FVC % % 89  P  Post FEV1/FCV % % 89  P  FEV1-Pre L 1.96  P  FEV1-Predicted Pre % 93  P  FEV1-Post L 2.00  P  DLCO uncorrected ml/min/mmHg 17.43  P  DLCO UNC% % 92  P  DLCO corrected ml/min/mmHg 17.43  P  DLCO COR %Predicted % 92  P  DLVA Predicted % 115  P  TLC L 4.09  P  TLC % Predicted % 82  P  RV % Predicted % 77  P    P Preliminary result    Simple office walk 224 (66+46 x 2) feet Pod A at [facility] x  3 laps goal with forehead probe 07/02/2023    O2 used ra   Number laps completed Sit stand x 15   Comments about pace Regular   Resting Pulse Ox/HR 100% % and 73/min   Final Pulse Ox/HR 93% % and 86/min   Desaturated </= 88% No   Desaturated <= 3% points Yes   Got Tachycardic >/= 90/min No   Symptoms at end of test x   Miscellaneous comments x       LAB RESULTS last 96 hours No results found.       has a past medical history of AICD (automatic cardioverter/defibrillator) present, Arthritis, Asthma, Carpal tunnel syndrome, bilateral, CKD (chronic kidney disease), Diabetes mellitus, Encounter for assessment of implantable cardioverter-defibrillator (ICD), GERD (gastroesophageal reflux  disease), Hyperlipemia, Hypertension, Obesity, Sleep apnea, and Wears glasses.   reports that she has never smoked. She has never used smokeless tobacco.  Past Surgical History:  Procedure Laterality Date   ABDOMINAL HYSTERECTOMY     BIOPSY  03/01/2019   Procedure: BIOPSY;  Surgeon: Charna Elizabeth, MD;  Location: [facility];  Service: Endoscopy;;   BREAST SURGERY  1968   breast mass excision   CARPAL TUNNEL RELEASE Left 07/10/2017   Procedure: CARPAL TUNNEL RELEASE;  Surgeon: Coletta Memos, MD;  Location: [facility];  Service: Neurosurgery;  Laterality: Left;  left   CARPAL TUNNEL RELEASE Right 03/19/2018   Procedure: RIGHT CARPAL TUNNEL RELEASE;  Surgeon: Coletta Memos, MD;  Location: [facility];  Service: Neurosurgery;  Laterality: Right;  right   CERVICAL SPINE SURGERY  2006   CHOLECYSTECTOMY     COLONOSCOPY WITH PROPOFOL N/A 03/01/2019   Procedure: COLONOSCOPY WITH PROPOFOL;  Surgeon: Charna Elizabeth, MD;  Location: [facility];  Service: Endoscopy;  Laterality: N/A;   DILATION AND CURETTAGE OF UTERUS     ICD IMPLANT     JOINT REPLACEMENT     TONSILLECTOMY  1961   TOTAL KNEE ARTHROPLASTY Left 01/26/2018   Procedure: LEFT TOTAL KNEE ARTHROPLASTY;  Surgeon: Kathryne Hitch, MD;  Location: [facility];  Service: Orthopedics;  Laterality: Left;   trigger fnger Left 11/2022    Allergies  Allergen Reactions   Other Other (See Comments)    NO BLOOD   . Jehovah witness    Immunization History  Administered Date(s) Administered   Fluad Quad(high Dose 65+) 02/07/2020, 02/12/2021, 03/18/2022, 02/14/2023   Hepatitis A 01/19/2012, 02/16/2012   Hepatitis B 01/19/2012, 02/16/2012   IPV 02/16/2012   Influenza, High Dose Seasonal PF 02/17/2016, 03/04/2018, 02/10/2019   Influenza-Unspecified 02/02/2014, 02/16/2014   MMR 04/04/1994   PFIZER(Purple Top)SARS-COV-2 Vaccination 06/11/2019, 07/02/2019, 02/21/2020, 03/03/2022   Pfizer Covid-19 Vaccine Bivalent Booster 42yrs & up 03/20/2021, 02/14/2023    Pneumococcal Conjugate-13 05/16/2020   Pneumococcal Polysaccharide-23 06/18/2021   Td 09/08/2006   Tdap 01/19/2012, 06/21/2020   Zoster Recombinant(Shingrix) 06/04/2021, 10/21/2021   Zoster, Live 12/15/2007    Family History  Problem Relation Age of Onset   Hypertension Mother    Stroke Mother    Heart disease Father    Heart attack Sister    Heart attack Brother      Current Outpatient Medications:    acetaminophen (TYLENOL) 500 MG tablet, Take 500 mg by mouth every 6 (six) hours as needed for moderate pain., Disp: , Rfl:    albuterol (PROVENTIL HFA;VENTOLIN HFA) 108 (90 Base) MCG/ACT inhaler, Inhale 2 puffs into the lungs every 4 (four) hours as needed for wheezing or shortness of breath., Disp: , Rfl:    carvedilol (COREG) 25 MG tablet, TAKE 1 AND 1/2 TABLETS BY MOUTH  TWICE DAILY, Disp: 300 tablet, Rfl: 2   Cholecalciferol (VITAMIN D) 50 MCG (2000 UT) tablet, Take 2,000 Units by mouth daily., Disp: , Rfl:    Chromium 1000 MCG TABS, Take 1,000 mcg by mouth daily. , Disp: , Rfl:    Cinnamon 500 MG capsule, Take 500 mg by mouth daily., Disp: , Rfl:    Continuous Glucose Sensor (FREESTYLE LIBRE 3 PLUS SENSOR) MISC, Change sensor every 15 days., Disp: 6 each, Rfl: 3   dapagliflozin propanediol (FARXIGA) 10 MG TABS tablet, Take 1 tablet (10 mg total) by mouth daily., Disp: 30 tablet, Rfl: 2   famotidine (PEPCID) 20 MG tablet, TAKE 1 TABLET BY MOUTH DAILY AS  NEEDED, Disp: 100 tablet, Rfl: 2   fluticasone (FLONASE) 50 MCG/ACT nasal spray, Place 1 spray into both nostrils daily. (Patient taking differently: Place 1 spray into both nostrils as needed for allergies.), Disp: 16 g, Rfl: 2   furosemide (LASIX) 40 MG tablet, TAKE 1 TABLET BY MOUTH EVERY DAY IN THE MORNING,  Disp: 90 tablet, Rfl: 3   hydrALAZINE (APRESOLINE) 50 MG tablet, TAKE 1 TABLET BY MOUTH TWICE  DAILY, Disp: 200 tablet, Rfl: 2   insulin lispro protamine-lispro (HUMALOG 75/25 MIX) (75-25) 100 UNIT/ML SUSP injection, Inject  20 Units into the skin 2 (two) times daily with a meal., Disp: , Rfl:    nystatin (NYSTATIN) powder, Apply topically 3 (three) times daily as needed. As needed, Disp: 60 g, Rfl: 2   Omega-3 Fatty Acids (FISH OIL) 500 MG CAPS, Take 500 mg by mouth daily., Disp: , Rfl:    omeprazole (PRILOSEC) 40 MG capsule, TAKE 1 CAPSULE BY MOUTH DAILY  BEFORE BREAKFAST, Disp: 100 capsule, Rfl: 1   pravastatin (PRAVACHOL) 40 MG tablet, Take 1 tablet (40 mg total) by mouth daily., Disp: 100 tablet, Rfl: 2   Probiotic Product (PROBIOTIC PO), Take by mouth., Disp: , Rfl:    pyridOXINE (VITAMIN B6) 25 MG tablet, Take 25 mg by mouth daily., Disp: , Rfl:    sacubitril-valsartan (ENTRESTO) 49-51 MG, Take 1 tablet by mouth 2 (two) times daily., Disp: 180 tablet, Rfl: 3   Semaglutide, 2 MG/DOSE, 8 MG/3ML SOPN, Inject 2 mg as directed once a week., Disp: 3 mL, Rfl: 2   vitamin B-12 (CYANOCOBALAMIN) 100 MCG tablet, Take 100 mcg by mouth daily., Disp: , Rfl:    VYZULTA 0.024 % SOLN, Apply 1-2 drops to eye daily., Disp: , Rfl:       Objective:   Vitals:   07/02/23 1412  BP: 111/72  Pulse: 76  Temp: 97.8 F (36.6 C)  TempSrc: Oral  SpO2: 95%  Weight: 236 lb 6.4 oz (107.2 kg)  Height: 5\' 4"  (1.626 m)    Estimated body mass index is 40.58 kg/m as calculated from the following:   Height as of this encounter: 5\' 4"  (1.626 m).   Weight as of this encounter: 236 lb 6.4 oz (107.2 kg).  @WEIGHTCHANGE @  [employer]   07/02/23 1412  Weight: 236 lb 6.4 oz (107.2 kg)     Physical Exam   General: No distress. Looks well O2 at rest: no Cane present: no Sitting in wheel chair: no Frail: no Obese: no Neuro: Alert and Oriented x 3. GCS 15. Speech normal Psych: Pleasant Resp:  Barrel Chest - no.  Wheeze - no, Crackles - no, No overt respiratory distress CVS: Normal heart sounds. Murmurs - no Ext: Stigmata of Connective Tissue Disease - no HEENT: Normal upper airway. PEERL +. No post nasal drip         Assessment:       ICD-10-CM   1. Abnormal CT of the chest  R93.89 CT Chest High Resolution    2. ILD (interstitial lung disease) (HCC)  J84.9 CT Chest High Resolution    3. ANA positive  R76.8 CT Chest High Resolution         Plan:     Patient Instructions     ICD-10-CM   1. Abnormal CT of the chest  R93.89     2. ILD (interstitial lung disease) (HCC)  J84.9     3. ANA positive  R76.8        PCP [name], MD Is concerned for a condition called interstitial lung disease but in my view you have some interstitial lung abnormalities [ILA].  .  In my view it has been there since 2021 and it is stable.  Unclear if it was that in 2011 or not  ANA positive but unclear significance  Glad you got rid of feather pillow and stopped fish oil  PFT normal suggesting burden of problem is very very very mild   Plan - do HRCT in Oct 2025   Follow-up - Return in Nov 2025; 15 min visti to see APP after CT chest   FOLLOWUP Return in about 8 months (around 02/29/2024) for with any of the APPS, Face to Face Visit, after HRCT chest.    SIGNATURE    Dr. Kalman Shan, M.D., F.C.C.P,  Pulmonary and Critical Care Medicine Staff Physician, [facility] Director - Interstitial Lung Disease  Program  Pulmonary Fibrosis [facility] at [facility] [place], [place], [address]  Pager: [phone], If no answer or between  15:00h - 7:00h: call [phone] Telephone: [phone]  2:38 PM 07/02/2023

## 2023-07-03 ENCOUNTER — Telehealth: Payer: Self-pay | Admitting: Pharmacist

## 2023-07-03 DIAGNOSIS — Z794 Long term (current) use of insulin: Secondary | ICD-10-CM

## 2023-07-03 NOTE — Progress Notes (Signed)
   07/03/2023  Patient ID: Yvonne Miller, female   DOB: March 20, 1951, 73 y.o.   MRN: [id number]  Patient called to let me know she signed a form that had been left for her at the office.  She just wanted me to know that it was there.  I will not be back into the office until next Wednesday. She said there were some items she did not know how to complete. I will follow up with her on Wednesday.  Beecher Mcardle, PharmD, BCACP Clinical Pharmacist [phone]

## 2023-07-07 ENCOUNTER — Encounter: Payer: Self-pay | Admitting: Internal Medicine

## 2023-07-07 ENCOUNTER — Ambulatory Visit: Payer: Medicare Other | Admitting: Internal Medicine

## 2023-07-07 VITALS — BP 108/72 | HR 82 | Temp 97.4°F | Ht 64.0 in | Wt 235.8 lb

## 2023-07-07 DIAGNOSIS — I428 Other cardiomyopathies: Secondary | ICD-10-CM | POA: Diagnosis not present

## 2023-07-07 DIAGNOSIS — E66813 Obesity, class 3: Secondary | ICD-10-CM

## 2023-07-07 DIAGNOSIS — Z6841 Body Mass Index (BMI) 40.0 and over, adult: Secondary | ICD-10-CM

## 2023-07-07 DIAGNOSIS — N3001 Acute cystitis with hematuria: Secondary | ICD-10-CM | POA: Diagnosis not present

## 2023-07-07 DIAGNOSIS — I130 Hypertensive heart and chronic kidney disease with heart failure and stage 1 through stage 4 chronic kidney disease, or unspecified chronic kidney disease: Secondary | ICD-10-CM | POA: Diagnosis not present

## 2023-07-07 DIAGNOSIS — Z794 Long term (current) use of insulin: Secondary | ICD-10-CM

## 2023-07-07 DIAGNOSIS — E1122 Type 2 diabetes mellitus with diabetic chronic kidney disease: Secondary | ICD-10-CM | POA: Diagnosis not present

## 2023-07-07 DIAGNOSIS — R300 Dysuria: Secondary | ICD-10-CM

## 2023-07-07 DIAGNOSIS — N1832 Chronic kidney disease, stage 3b: Secondary | ICD-10-CM | POA: Diagnosis not present

## 2023-07-07 DIAGNOSIS — I509 Heart failure, unspecified: Secondary | ICD-10-CM | POA: Diagnosis not present

## 2023-07-07 LAB — POCT URINALYSIS DIPSTICK
Bilirubin, UA: NEGATIVE
Glucose, UA: POSITIVE — AB
Ketones, UA: NEGATIVE
Nitrite, UA: NEGATIVE
Protein, UA: NEGATIVE
Spec Grav, UA: 1.02 (ref 1.010–1.025)
Urobilinogen, UA: 0.2 U/dL
pH, UA: 5.5 (ref 5.0–8.0)

## 2023-07-07 MED ORDER — CEPHALEXIN 500 MG PO CAPS: ORAL_CAPSULE | ORAL | 0 refills | Status: DC

## 2023-07-07 NOTE — Assessment & Plan Note (Signed)
 BMI 42. She was congratulated on her 5lb weight loss since last month. She is encouraged to resume her regular exercise regimen.

## 2023-07-07 NOTE — Assessment & Plan Note (Addendum)
 Her EF has improved, most recent echo reviewed in detail. Her last echo was Sept 2024, nl EF 59% with grad 1 diastolic dysfunction.  Reminded of importance of medication/dietary compliance. She will continue with regular f/u with Cardiology.

## 2023-07-07 NOTE — Assessment & Plan Note (Addendum)
 Chronic, well controlled.  She will continue with carvedilol 25mg  1-1/2 tabs twice daily, furosemide 40mg  and hydralazine 50mg  twice daily. Encouraged to follow low sodium diet. Reminded to avoid NSAIDs to decrease risk of CKD progression. Please see below regarding HF.

## 2023-07-07 NOTE — Assessment & Plan Note (Addendum)
 Chronic, now on Ozempic 2mg  weekly, Farxiga 10mg  daily and Humalog 75/25 20 units before lunch and dinner. Again, CKD status was considered for abx dosing. Chronic, she is encouraged to keep BP well controlled and to stay well hydrated to decrease risk of CKD progression.

## 2023-07-07 NOTE — Patient Instructions (Addendum)
 Restart 75/25 before dinner, only 10 units  Hypertension, Adult Hypertension is another name for high blood pressure. High blood pressure forces your heart to work harder to pump blood. This can cause problems over time. There are two numbers in a blood pressure reading. There is a top number (systolic) over a bottom number (diastolic). It is best to have a blood pressure that is below 120/80. What are the causes? The cause of this condition is not known. Some other conditions can lead to high blood pressure. What increases the risk? Some lifestyle factors can make you more likely to develop high blood pressure: Smoking. Not getting enough exercise or physical activity. Being overweight. Having too much fat, sugar, calories, or salt (sodium) in your diet. Drinking too much alcohol. Other risk factors include: Having any of these conditions: Heart disease. Diabetes. High cholesterol. Kidney disease. Obstructive sleep apnea. Having a family history of high blood pressure and high cholesterol. Age. The risk increases with age. Stress. What are the signs or symptoms? High blood pressure may not cause symptoms. Very high blood pressure (hypertensive crisis) may cause: Headache. Fast or uneven heartbeats (palpitations). Shortness of breath. Nosebleed. Vomiting or feeling like you may vomit (nauseous). Changes in how you see. Very bad chest pain. Feeling dizzy. Seizures. How is this treated? This condition is treated by making healthy lifestyle changes, such as: Eating healthy foods. Exercising more. Drinking less alcohol. Your doctor may prescribe medicine if lifestyle changes do not help enough and if: Your top number is above 130. Your bottom number is above 80. Your personal target blood pressure may vary. Follow these instructions at home: Eating and drinking  If told, follow the DASH eating plan. To follow this plan: Fill one half of your plate at each meal with fruits  and vegetables. Fill one fourth of your plate at each meal with whole grains. Whole grains include whole-wheat pasta, brown rice, and whole-grain bread. Eat or drink low-fat dairy products, such as skim milk or low-fat yogurt. Fill one fourth of your plate at each meal with low-fat (lean) proteins. Low-fat proteins include fish, chicken without skin, eggs, beans, and tofu. Avoid fatty meat, cured and processed meat, or chicken with skin. Avoid pre-made or processed food. Limit the amount of salt in your diet to less than 1,500 mg each day. Do not drink alcohol if: Your doctor tells you not to drink. You are pregnant, may be pregnant, or are planning to become pregnant. If you drink alcohol: Limit how much you have to: 0-1 drink a day for women. 0-2 drinks a day for men. Know how much alcohol is in your drink. In the [place] one drink equals one 12 oz bottle of beer (355 mL), one 5 oz glass of wine (148 mL), or one 1 oz glass of hard liquor (44 mL). Lifestyle  Work with your doctor to stay at a healthy weight or to lose weight. Ask your doctor what the best weight is for you. Get at least 30 minutes of exercise that causes your heart to beat faster (aerobic exercise) most days of the week. This may include walking, swimming, or biking. Get at least 30 minutes of exercise that strengthens your muscles (resistance exercise) at least 3 days a week. This may include lifting weights or doing Pilates. Do not smoke or use any products that contain nicotine or tobacco. If you need help quitting, ask your doctor. Check your blood pressure at home as told by your doctor. Keep all  follow-up visits. Medicines Take over-the-counter and prescription medicines only as told by your doctor. Follow directions carefully. Do not skip doses of blood pressure medicine. The medicine does not work as well if you skip doses. Skipping doses also puts you at risk for problems. Ask your doctor about side effects or  reactions to medicines that you should watch for. Contact a doctor if: You think you are having a reaction to the medicine you are taking. You have headaches that keep coming back. You feel dizzy. You have swelling in your ankles. You have trouble with your vision. Get help right away if: You get a very bad headache. You start to feel mixed up (confused). You feel weak or numb. You feel faint. You have very bad pain in your: Chest. Belly (abdomen). You vomit more than once. You have trouble breathing. These symptoms may be an emergency. Get help right away. Call 911. Do not wait to see if the symptoms will go away. Do not drive yourself to the hospital. Summary Hypertension is another name for high blood pressure. High blood pressure forces your heart to work harder to pump blood. For most people, a normal blood pressure is less than 120/80. Making healthy choices can help lower blood pressure. If your blood pressure does not get lower with healthy choices, you may need to take medicine. This information is not intended to replace advice given to you by your health care provider. Make sure you discuss any questions you have with your health care provider. Document Revised: 02/07/2021 Document Reviewed: 02/07/2021 Elsevier Patient Education  2024 [employer].

## 2023-07-07 NOTE — Progress Notes (Addendum)
 I,Yvonne Miller, CMA,acting as a Neurosurgeon for Yvonne Aliment, MD.,have documented all relevant documentation on the behalf of Yvonne Aliment, MD,as directed by  Yvonne Aliment, MD while in the presence of Yvonne Aliment, MD.  Subjective:  Patient ID: Yvonne Miller , female    DOB: 06/09/1950 , 73 y.o.   MRN: [id number]  Chief Complaint  Patient presents with   Hypertension   Diabetes    HPI  She is here today for a diabetes and HTN & cholesterol f/u. She reports compliance with meds. She denies having any headaches, chest pain, and palpitations. She states her morning Bs range from 70-150 in the morning. She states her sugars are higher in the morning, after further questioning she admits she stopped taking her 75/25 before dinner because her sugars were low.   She experiences pressure & burning when urinating. Her sx started on Sunday.  She has started to take cranberry juice.   Letter sent to [facility] for mammogram.   Diabetes She presents for her follow-up diabetic visit. She has type 2 diabetes mellitus. Her disease course has been stable. There are no hypoglycemic associated symptoms. Pertinent negatives for diabetes include no blurred vision, no chest pain, no polydipsia, no polyphagia and no polyuria. There are no hypoglycemic complications. Diabetic complications include nephropathy. Risk factors for coronary artery disease include diabetes mellitus, dyslipidemia, hypertension, obesity and sedentary lifestyle. Current diabetic treatment includes insulin injections. She is compliant with treatment some of the time. She is following a generally healthy diet. She participates in exercise intermittently. Her breakfast blood glucose is taken between 8-9 am. Her breakfast blood glucose range is generally 110-130 mg/dl. An ACE inhibitor/angiotensin II receptor blocker is being taken.  Hypertension This is a chronic problem. The current episode started more than 1 year ago. The problem has  been gradually improving since onset. The problem is controlled. Pertinent negatives include no blurred vision, chest pain, palpitations or shortness of breath. The current treatment provides moderate improvement. Compliance problems include exercise.  Hypertensive end-organ damage includes kidney disease and heart failure.     Past Medical History:  Diagnosis Date   AICD (automatic cardioverter/defibrillator) present    Arthritis    Asthma    Carpal tunnel syndrome, bilateral    CKD (chronic kidney disease)    Diabetes mellitus    Encounter for assessment of implantable cardioverter-defibrillator (ICD)    GERD (gastroesophageal reflux disease)    Hyperlipemia    Hypertension    Obesity    morbid   Sleep apnea    wears CPAP set at 12   Wears glasses      Family History  Problem Relation Age of Onset   Hypertension Mother    Stroke Mother    Heart disease Father    Heart attack Sister    Heart attack Brother      Current Outpatient Medications:    acetaminophen (TYLENOL) 500 MG tablet, Take 500 mg by mouth every 6 (six) hours as needed for moderate pain., Disp: , Rfl:    albuterol (PROVENTIL HFA;VENTOLIN HFA) 108 (90 Base) MCG/ACT inhaler, Inhale 2 puffs into the lungs every 4 (four) hours as needed for wheezing or shortness of breath., Disp: , Rfl:    carvedilol (COREG) 25 MG tablet, TAKE 1 AND 1/2 TABLETS BY MOUTH  TWICE DAILY, Disp: 300 tablet, Rfl: 2   Cholecalciferol (VITAMIN D) 50 MCG (2000 UT) tablet, Take 2,000 Units by mouth daily., Disp: , Rfl:  Cinnamon 500 MG capsule, Take 500 mg by mouth daily. (Patient not taking: Reported on 07/22/2023), Disp: , Rfl:    Continuous Glucose Sensor (FREESTYLE LIBRE 3 PLUS SENSOR) MISC, Change sensor every 15 days., Disp: 6 each, Rfl: 3   dapagliflozin propanediol (FARXIGA) 10 MG TABS tablet, Take 1 tablet (10 mg total) by mouth daily., Disp: 30 tablet, Rfl: 2   famotidine (PEPCID) 20 MG tablet, TAKE 1 TABLET BY MOUTH DAILY AS   NEEDED, Disp: 100 tablet, Rfl: 2   fluticasone (FLONASE) 50 MCG/ACT nasal spray, Place 1 spray into both nostrils daily. (Patient taking differently: Place 1 spray into both nostrils as needed for allergies.), Disp: 16 g, Rfl: 2   furosemide (LASIX) 40 MG tablet, TAKE 1 TABLET BY MOUTH EVERY DAY IN THE MORNING, Disp: 90 tablet, Rfl: 3   hydrALAZINE (APRESOLINE) 50 MG tablet, TAKE 1 TABLET BY MOUTH TWICE  DAILY, Disp: 200 tablet, Rfl: 2   insulin lispro protamine-lispro (HUMALOG 75/25 MIX) (75-25) 100 UNIT/ML SUSP injection, Inject 10 Units into the skin 2 (two) times daily with a meal., Disp: , Rfl:    nystatin (NYSTATIN) powder, Apply topically 3 (three) times daily as needed. As needed, Disp: 60 g, Rfl: 2   omeprazole (PRILOSEC) 40 MG capsule, TAKE 1 CAPSULE BY MOUTH DAILY  BEFORE BREAKFAST, Disp: 100 capsule, Rfl: 1   pravastatin (PRAVACHOL) 40 MG tablet, Take 1 tablet (40 mg total) by mouth daily., Disp: 100 tablet, Rfl: 2   Probiotic Product (PROBIOTIC PO), Take 1 capsule by mouth at bedtime., Disp: , Rfl:    pyridOXINE (VITAMIN B6) 25 MG tablet, Take 25 mg by mouth daily., Disp: , Rfl:    sacubitril-valsartan (ENTRESTO) 49-51 MG, Take 1 tablet by mouth 2 (two) times daily., Disp: 180 tablet, Rfl: 3   Semaglutide, 2 MG/DOSE, 8 MG/3ML SOPN, Inject 2 mg as directed once a week., Disp: 3 mL, Rfl: 2   vitamin B-12 (CYANOCOBALAMIN) 100 MCG tablet, Take 100 mcg by mouth daily., Disp: , Rfl:    VYZULTA 0.024 % SOLN, Apply 1-2 drops to eye daily., Disp: , Rfl:    cephALEXin (KEFLEX) 500 MG capsule, One tab po twice daily x 7 days (Patient not taking: Reported on 07/22/2023), Disp: 14 capsule, Rfl: 0   hydrocortisone 2.5 % cream, Apply 1 Application topically every other day., Disp: , Rfl:    hydroquinone 4 % cream, Apply topically once a week., Disp: , Rfl:    ketoconazole (NIZORAL) 2 % shampoo, Apply 1 Application topically 2 (two) times a week., Disp: , Rfl:    Allergies  Allergen Reactions    Other Other (See Comments)    NO BLOOD   . Jehovah witness     Review of Systems  Constitutional: Negative.   Eyes:  Negative for blurred vision.  Respiratory: Negative.  Negative for shortness of breath.   Cardiovascular: Negative.  Negative for chest pain and palpitations.  Gastrointestinal: Negative.   Endocrine: Negative for polydipsia, polyphagia and polyuria.  Genitourinary:  Positive for dysuria.  Neurological: Negative.   Psychiatric/Behavioral: Negative.       Today's Vitals   07/07/23 0859  BP: 108/72  Pulse: 82  Temp: (!) 97.4 F (36.3 C)  SpO2: 98%  Weight: 235 lb 12.8 oz (107 kg)  Height: 5\' 4"  (1.626 m)   Body mass index is 40.47 kg/m.  Wt Readings from Last 3 Encounters:  07/07/23 235 lb 12.8 oz (107 kg)  07/02/23 236 lb 6.4 oz (107.2 kg)  06/30/23 233 lb (105.7 kg)     Objective:  Physical Exam Vitals and nursing note reviewed.  Constitutional:      Appearance: Normal appearance.  HENT:     Head: Normocephalic and atraumatic.  Eyes:     Extraocular Movements: Extraocular movements intact.  Cardiovascular:     Rate and Rhythm: Normal rate and regular rhythm.     Heart sounds: Normal heart sounds.  Pulmonary:     Effort: Pulmonary effort is normal.     Breath sounds: Normal breath sounds.  Musculoskeletal:     Cervical back: Normal range of motion.  Skin:    General: Skin is warm.  Neurological:     General: No focal deficit present.     Mental Status: She is alert.  Psychiatric:        Mood and Affect: Mood normal.        Behavior: Behavior normal.         Assessment And Plan:  Hypertensive heart and renal disease with renal failure, stage 1 through stage 4 or unspecified chronic kidney disease, with heart failure (HCC) Assessment & Plan: Chronic, well controlled.  She will continue with carvedilol 25mg  1-1/2 tabs twice daily, furosemide 40mg  and hydralazine 50mg  twice daily. Encouraged to follow low sodium diet. Reminded to avoid  NSAIDs to decrease risk of CKD progression. Please see below regarding HF.   Orders: -     CBC -     CMP14+EGFR -     Lipid panel -     Hemoglobin A1c  Non-ischemic cardiomyopathy (HCC) Assessment & Plan: Her EF has improved, most recent echo reviewed in detail. Reminded of importance of medication/dietary compliance. She will continue with regular f/u with Cardiology.    Type 2 diabetes mellitus with stage 3b chronic kidney disease, with long-term current use of insulin (HCC) Assessment & Plan: Chronic, now on Ozempic 2mg  weekly, Farxiga 10mg  daily and Humalog 75/25 20 units before lunch and dinner. Again, CKD status was considered for abx dosing. Chronic, she is encouraged to keep BP well controlled and to stay well hydrated to decrease risk of CKD progression.    Orders: -     CMP14+EGFR -     Lipid panel -     Hemoglobin A1c  Dysuria -     POCT urinalysis dipstick -     Urine Culture  Acute cystitis with hematuria -     Cephalexin; One tab po twice daily x 7 days (Patient not taking: Reported on 07/22/2023)  Dispense: 14 capsule; Refill: 0  Class 3 severe obesity due to excess calories with serious comorbidity and body mass index (BMI) of 40.0 to 44.9 in adult [facility]) Assessment & Plan: BMI 42. She was congratulated on her 5lb weight loss since last month. She is encouraged to resume her regular exercise regimen.    She is encouraged to strive for BMI less than 30 to decrease cardiac risk. Advised to aim for at least 150 minutes of exercise per week.    Return for 4 month dm f/u. [name]  Patient was given opportunity to ask questions. Patient verbalized understanding of the plan and was able to repeat key elements of the plan. All questions were answered to their satisfaction.    I, Yvonne Aliment, MD, have reviewed all documentation for this visit. The documentation on 07/07/23 for the exam, diagnosis, procedures, and orders are all accurate and complete.   IF YOU HAVE BEEN  REFERRED TO A SPECIALIST, IT  MAY TAKE 1-2 WEEKS TO SCHEDULE/PROCESS THE REFERRAL. IF YOU HAVE NOT HEARD FROM US/SPECIALIST IN TWO WEEKS, PLEASE GIVE [place] A CALL AT [phone].   THE PATIENT IS ENCOURAGED TO PRACTICE SOCIAL DISTANCING DUE TO THE COVID-19 PANDEMIC.

## 2023-07-08 ENCOUNTER — Encounter: Payer: Self-pay | Admitting: Internal Medicine

## 2023-07-08 ENCOUNTER — Encounter: Payer: Medicare Other | Admitting: Cardiology

## 2023-07-08 ENCOUNTER — Telehealth: Payer: Self-pay

## 2023-07-08 ENCOUNTER — Telehealth: Payer: Self-pay | Admitting: Pharmacist

## 2023-07-08 DIAGNOSIS — N1832 Chronic kidney disease, stage 3b: Secondary | ICD-10-CM

## 2023-07-08 LAB — CMP14+EGFR
ALT: 16 IU/L (ref 0–32)
AST: 16 IU/L (ref 0–40)
Albumin: 4.1 g/dL (ref 3.8–4.8)
Alkaline Phosphatase: 117 IU/L (ref 44–121)
BUN/Creatinine Ratio: 18 (ref 12–28)
BUN: 25 mg/dL (ref 8–27)
Bilirubin Total: 0.3 mg/dL (ref 0.0–1.2)
CO2: 22 mmol/L (ref 20–29)
Calcium: 9.1 mg/dL (ref 8.7–10.3)
Chloride: 102 mmol/L (ref 96–106)
Creatinine, Ser: 1.36 mg/dL — ABNORMAL HIGH (ref 0.57–1.00)
Globulin, Total: 2.9 g/dL (ref 1.5–4.5)
Glucose: 157 mg/dL — ABNORMAL HIGH (ref 70–99)
Potassium: 4.6 mmol/L (ref 3.5–5.2)
Sodium: 141 mmol/L (ref 134–144)
Total Protein: 7 g/dL (ref 6.0–8.5)
eGFR: 41 mL/min/{1.73_m2} — ABNORMAL LOW (ref >59)

## 2023-07-08 LAB — LIPID PANEL
Chol/HDL Ratio: 3.5 ratio (ref 0.0–4.4)
Cholesterol, Total: 144 mg/dL (ref 100–199)
HDL: 41 mg/dL (ref >39)
LDL Chol Calc (NIH): 67 mg/dL (ref 0–99)
Triglycerides: 221 mg/dL — ABNORMAL HIGH (ref 0–149)
VLDL Cholesterol Cal: 36 mg/dL (ref 5–40)

## 2023-07-08 LAB — CBC
Hematocrit: 39.2 % (ref 34.0–46.6)
Hemoglobin: 12.6 g/dL (ref 11.1–15.9)
MCH: 26 pg — ABNORMAL LOW (ref 26.6–33.0)
MCHC: 32.1 g/dL (ref 31.5–35.7)
MCV: 81 fL (ref 79–97)
Platelets: 352 10*3/uL (ref 150–450)
RBC: 4.85 x10E6/uL (ref 3.77–5.28)
RDW: 15 % (ref 11.7–15.4)
WBC: 8.6 10*3/uL (ref 3.4–10.8)

## 2023-07-08 LAB — HEMOGLOBIN A1C
Est. average glucose Bld gHb Est-mCnc: 157 mg/dL
Hgb A1c MFr Bld: 7.1 % — ABNORMAL HIGH (ref 4.8–5.6)

## 2023-07-08 NOTE — Telephone Encounter (Signed)
 Received Patient part of application, Faxed Provider page to office. [place] Farxiga.

## 2023-07-08 NOTE — Progress Notes (Signed)
   07/08/2023  Patient ID: Yvonne Miller, female   DOB: 01/18/1951, 73 y.o.   MRN: [id number]  Patient brought signed forms to the office. There were a few areas she did not complete. She was called to gather the information and it was entered on the form on her behalf.  The form for Farxiga 10 mg will be faxed to [phone] attention Star Age, CPhT for the patient assistance process to be completed.  I will be following up with the Patient next week (07/13/2023) for our regularly scheduled appointment.  Beecher Mcardle, PharmD, BCACP Clinical Pharmacist [phone]

## 2023-07-10 LAB — URINE CULTURE

## 2023-07-10 NOTE — Telephone Encounter (Signed)
 PAP: Application for Marcelline Deist has been submitted to [employer] ([employer]), via fax

## 2023-07-13 ENCOUNTER — Other Ambulatory Visit: Payer: Self-pay | Admitting: Pharmacist

## 2023-07-13 DIAGNOSIS — Z794 Long term (current) use of insulin: Secondary | ICD-10-CM

## 2023-07-13 NOTE — Telephone Encounter (Signed)
 PAP: Patient assistance application for Marcelline Deist has been approved by PAP Companies: [employer] from 07/10/2023 to 05/04/2024. Medication should be delivered to PAP Delivery: Home. For further shipping updates, please contact [employer] ([employer]) at [phone]. Patient ID is: not provided

## 2023-07-13 NOTE — Progress Notes (Signed)
 07/13/2023 Name: Yvonne Miller MRN: [id number] DOB: August 03, 1950  Chief Complaint  Patient presents with   Medication Management    Diabetes     Yvonne Miller is a 73 y.o. year old female who presented for a telephone visit.   They were referred to the pharmacist by their PCP for assistance in managing diabetes.    Subjective:  Care Team: Primary Care Provider: [name], MD ; Next Scheduled Visit: 07/13/2023  Medication Access/Adherence  Current Pharmacy:  [facility] - [place], [place] - [address] [facility] [address] [place] [address] Phone: [phone] Fax: [phone]  [id number] - [place], [place] - [address] [place] [address] Phone: [phone] Fax: [phone]  [facility] - [place], [place] - [address] [place] [address] Phone: [phone] Fax: [phone]  [facility] - [place], [place] - [address] [place] [address] Phone: [phone] Fax: [phone]   Patient reports affordability concerns with their medications: Yes -Ozempic Patient reports access/transportation concerns to their pharmacy: No  Patient reports adherence concerns with their medications:  No      Diabetes:  Current medications:  Medications tried in the past:   Current glucose readings: CGM    Patient denies hypoglycemic s/sx including  dizziness, shakiness, sweating. Patient denies hyperglycemic symptoms including  polyuria, polydipsia, polyphagia, nocturia, neuropathy, blurred vision.   Current medication access support: [place] through [employer], Ozempic through [employer] and Humalog 75/25 through [employer].  Patient will come to the office and complete an application for Xyzulta eye drops.   Objective:  Lab  Results  Component Value Date   HGBA1C 7.1 (H) 07/07/2023    Lab Results  Component Value Date   CREATININE 1.36 (H) 07/07/2023   BUN 25 07/07/2023   NA 141 07/07/2023   K 4.6 07/07/2023   CL 102 07/07/2023   CO2 22 07/07/2023    Lab Results  Component Value Date   CHOL 144 07/07/2023   HDL 41 07/07/2023   LDLCALC 67 07/07/2023   TRIG 221 (H) 07/07/2023   CHOLHDL 3.5 07/07/2023    Medications Reviewed Today     Reviewed by Yvonne Miller, [facility] (Pharmacist) on 07/13/23 at 1342  Med List Status: <None>   Medication Order Taking? Sig Documenting Provider Last Dose Status Informant  acetaminophen (TYLENOL) 500 MG tablet [id number] Yes Take 500 mg by mouth every 6 (six) hours as needed for moderate pain. [provider] Taking Active   albuterol (PROVENTIL HFA;VENTOLIN HFA) 108 (90 Base) MCG/ACT inhaler [id number] Yes Inhale 2 puffs into the lungs every 4 (four) hours as needed for wheezing or shortness of breath. [provider] Taking Active Self  carvedilol (COREG) 25 MG tablet [id number] Yes TAKE 1 AND 1/2 TABLETS BY MOUTH  TWICE DAILY Yvonne Decamp, MD Taking Active   cephALEXin (KEFLEX) 500 MG capsule [id number] Yes One tab po twice daily x 7 days Yvonne Peng, MD Taking Active   Cholecalciferol (VITAMIN D) 50 MCG (2000 UT) tablet [id number] Yes Take 2,000 Units by mouth daily. [provider] Taking Active   Cinnamon 500 MG capsule [id number] Yes Take 500 mg by mouth daily. [provider] Taking Active Self           Med Note (HARRINGTON, APRIL  Thu Oct 21, 2018  8:17 AM)    Continuous Glucose Sensor (FREESTYLE LIBRE 3 PLUS SENSOR) MISC [id number] Yes Change sensor every 15 days. Yvonne Peng, MD Taking Active   dapagliflozin propanediol (FARXIGA) 10 MG TABS tablet [id number] Yes Take 1 tablet (10 mg total) by mouth daily. Yvonne Peng, MD Taking Active   famotidine (PEPCID) 20 MG tablet [id number] Yes TAKE 1 TABLET BY MOUTH DAILY AS  NEEDED  Yvonne Peng, MD Taking Active   fluticasone [facility]) 50 MCG/ACT nasal spray [id number] Yes Place 1 spray into both nostrils daily.  Patient taking differently: Place 1 spray into both nostrils as needed for allergies.   Yvonne Peng, MD Taking Active   furosemide (LASIX) 40 MG tablet [id number] Yes TAKE 1 TABLET BY MOUTH EVERY DAY IN THE Cheri Kearns, MD Taking Active   hydrALAZINE (APRESOLINE) 50 MG tablet [id number] Yes TAKE 1 TABLET BY MOUTH TWICE  DAILY Yvonne Decamp, MD Taking Active   hydrocortisone 2.5 % cream [id number] Yes Apply 1 Application topically every other day. [provider] Taking Active   hydroquinone 4 % cream [id number] Yes Apply topically once a week. [provider] Taking Active   insulin lispro protamine-lispro (HUMALOG 75/25 MIX) (75-25) 100 UNIT/ML SUSP injection [id number] Yes Inject 10 Units into the skin 2 (two) times daily with a meal. [provider] Taking Active   ketoconazole (NIZORAL) 2 % shampoo [id number] Yes Apply 1 Application topically 2 (two) times a week. [provider] Taking Active   nystatin (NYSTATIN) powder [id number] Yes Apply topically 3 (three) times daily as needed. As needed Yvonne Peng, MD Taking Active   omeprazole (PRILOSEC) 40 MG capsule [id number] Yes TAKE 1 CAPSULE BY MOUTH DAILY  BEFORE Yvonne Mariner, MD Taking Active   pravastatin (PRAVACHOL) 40 MG tablet [id number] Yes Take 1 tablet (40 mg total) by mouth daily. Yvonne Peng, MD Taking Active   Probiotic Product (PROBIOTIC PO) [id number]  Take 1 capsule by mouth at bedtime. [provider]  Active   pyridOXINE (VITAMIN B6) 25 MG tablet [id number] Yes Take 25 mg by mouth daily. [provider] Taking Active   sacubitril-valsartan (ENTRESTO) 49-51 MG [id number] Yes Take 1 tablet by mouth 2 (two) times daily. Yvonne Decamp, MD Taking Active   Semaglutide, 2 MG/DOSE, 8 MG/3ML SOPN [id number] Yes Inject 2 mg as directed once a  week. Yvonne Peng, MD Taking Active   vitamin B-12 (CYANOCOBALAMIN) 100 MCG tablet [id number] Yes Take 100 mcg by mouth daily. [provider] Taking Active   VYZULTA 0.024 % SOLN [id number] Yes Apply 1-2 drops to eye daily. [provider] Taking Active               Assessment/Plan:   Diabetes: - Currently controlled 7.1% - Reviewed goal A1c, goal fasting, and goal 2 hour post prandial glucose - Recommend to continue current therapy. Increase water intake to help with recurrent UTIs.  -- Meets financial criteria for [employer] patient assistance program through [employer]. Will collaborate with provider, CPhT, and patient to pursue assistance.   Lipids:  TG-221 on Pravastatin Patient said her insurance stopped paying for her to go to the gym.  I did some research.  [employer] has new program called Renew Active.  I used their search tool and found several gyms close to where the Patient lives. Increasing exercise may help with her triglycerides. A more potent statin may also be an option if she can tolerate.  Follow Up Plan:  Patient will come into the Clinic on Wednesday to complete the Vyzulta paperwork.  Yvonne Miller, PharmD, BCACP Clinical Pharmacist [phone]

## 2023-07-15 ENCOUNTER — Telehealth: Payer: Self-pay | Admitting: Pharmacist

## 2023-07-15 ENCOUNTER — Other Ambulatory Visit (HOSPITAL_COMMUNITY): Payer: Self-pay

## 2023-07-15 ENCOUNTER — Encounter: Payer: Self-pay | Admitting: Cardiology

## 2023-07-15 ENCOUNTER — Ambulatory Visit (HOSPITAL_COMMUNITY): Payer: Medicare Other | Attending: Cardiology

## 2023-07-15 DIAGNOSIS — I428 Other cardiomyopathies: Secondary | ICD-10-CM | POA: Diagnosis not present

## 2023-07-15 DIAGNOSIS — Z794 Long term (current) use of insulin: Secondary | ICD-10-CM

## 2023-07-15 LAB — ECHOCARDIOGRAM COMPLETE
Area-P 1/2: 3.17 cm2
S' Lateral: 2.7 cm

## 2023-07-15 NOTE — Progress Notes (Signed)
 I am seeing her on 07/31/2023 and will discuss more, fortunately her LVEF has remained stable and normal.

## 2023-07-15 NOTE — Progress Notes (Signed)
 07/15/2023 Name: Yvonne Miller MRN: [id number] DOB: 1950-08-22  Chief Complaint  Patient presents with   Medication Assistance    Vyzulta    Yvonne Miller is a 73 y.o. year old female who was referred for medication management by their primary care provider, [name], MD. They presented for a face to face visit today.   They were referred to the pharmacist by their PCP for assistance in managing diabetes  Purpose of today's visit was to review her CMG data and have her complete paperwork to obtain Vyzulta through the [employer] patient assistance program.   Subjective:  Care Team: Primary Care Provider: [name], MD ; Next Scheduled Visit: 12/01/2023   Medication Access/Adherence  Current Pharmacy:  [facility]) - [place], [place] - [address] [facility] [address] [place] [address] Phone: [phone] Fax: [phone]  [id number] - [place], [place] - [address] [place] [address] Phone: [phone] Fax: [phone]  [facility] - [place], [place] - [address] [place] [address] Phone: [phone] Fax: [phone]  [facility] - [place], [place] - [address] [place] [address] Phone: [phone] Fax: [phone]   Patient reports affordability concerns with their medications: Yes  Is receiving Patient Assistance Patient reports access/transportation concerns to their pharmacy: No  Patient reports adherence concerns with their medications:  No      Diabetes:  Current medications:  Medications tried in the past:     Patient denies hypoglycemic s/sx including  dizziness, shakiness, sweating. Patient denies hyperglycemic symptoms including  polyuria, polydipsia, polyphagia, nocturia, neuropathy, blurred  vision.    Objective:  Lab Results  Component Value Date   HGBA1C 7.1 (H) 07/07/2023    Lab Results  Component Value Date   CREATININE 1.36 (H) 07/07/2023   BUN 25 07/07/2023   NA 141 07/07/2023   K 4.6 07/07/2023   CL 102 07/07/2023   CO2 22 07/07/2023    Lab Results  Component Value Date   CHOL 144 07/07/2023   HDL 41 07/07/2023   LDLCALC 67 07/07/2023   TRIG 221 (H) 07/07/2023   CHOLHDL 3.5 07/07/2023    Medications Reviewed Today     Reviewed by Beecher Mcardle, [facility] (Pharmacist) on 07/15/23 at 0847  Med List Status: <None>   Medication Order Taking? Sig Documenting Provider Last Dose Status Informant  acetaminophen (TYLENOL) 500 MG tablet [id number] Yes Take 500 mg by mouth every 6 (six) hours as needed for moderate pain. [provider] Taking Active   albuterol (PROVENTIL HFA;VENTOLIN HFA) 108 (90 Base) MCG/ACT inhaler [id number] Yes Inhale 2 puffs into the lungs every 4 (four) hours as needed for wheezing or shortness of breath. [provider] Taking Active Self  carvedilol (COREG) 25 MG tablet [id number] Yes TAKE 1 AND 1/2 TABLETS BY MOUTH  TWICE DAILY Yates Decamp, MD Taking Active   cephALEXin (KEFLEX) 500 MG capsule [id number] Yes One tab po twice daily x 7 days [name], MD Taking Active   Cholecalciferol (VITAMIN D) 50 MCG (2000 UT) tablet [id number] Yes Take 2,000 Units by mouth daily. [provider] Taking Active   Cinnamon 500 MG capsule [id number] Yes Take 500 mg by mouth daily. [provider] Taking Active Self  Med Note Valrie Hart, APRIL   Thu Oct 21, 2018  8:17 AM)    Continuous Glucose Sensor (FREESTYLE LIBRE 3 PLUS SENSOR) [place] [id number] Yes Change sensor every 15 days. [name], MD Taking Active   dapagliflozin propanediol (FARXIGA) 10 MG TABS tablet [id number] Yes Take 1 tablet (10 mg total) by mouth daily. [name], MD Taking Active   famotidine (PEPCID) 20 MG tablet [id number] Yes TAKE 1  TABLET BY MOUTH DAILY AS  NEEDED [name], MD Taking Active   fluticasone [facility]) 50 MCG/ACT nasal spray [id number] Yes Place 1 spray into both nostrils daily.  Patient taking differently: Place 1 spray into both nostrils as needed for allergies.   [name], MD Taking Active   furosemide (LASIX) 40 MG tablet [id number] Yes TAKE 1 TABLET BY MOUTH EVERY DAY IN THE Cheri Kearns, MD Taking Active   hydrALAZINE (APRESOLINE) 50 MG tablet [id number] Yes TAKE 1 TABLET BY MOUTH TWICE  DAILY Yates Decamp, MD Taking Active   hydrocortisone 2.5 % cream [id number] Yes Apply 1 Application topically every other day. [provider] Taking Active   hydroquinone 4 % cream [id number] Yes Apply topically once a week. [provider] Taking Active   insulin lispro protamine-lispro (HUMALOG 75/25 MIX) (75-25) 100 UNIT/ML SUSP injection [id number] Yes Inject 10 Units into the skin 2 (two) times daily with a meal. [provider] Taking Active   ketoconazole (NIZORAL) 2 % shampoo [id number] Yes Apply 1 Application topically 2 (two) times a week. [provider] Taking Active   nystatin (NYSTATIN) powder [id number] Yes Apply topically 3 (three) times daily as needed. As needed [name], MD Taking Active   omeprazole (PRILOSEC) 40 MG capsule [id number] Yes TAKE 1 CAPSULE BY MOUTH DAILY  BEFORE Kathe Mariner, MD Taking Active   pravastatin (PRAVACHOL) 40 MG tablet [id number] Yes Take 1 tablet (40 mg total) by mouth daily. [name], MD Taking Active   Probiotic Product (PROBIOTIC PO) [id number] Yes Take 1 capsule by mouth at bedtime. [provider] Taking Active   pyridOXINE (VITAMIN B6) 25 MG tablet [id number] Yes Take 25 mg by mouth daily. [provider] Taking Active   sacubitril-valsartan (ENTRESTO) 49-51 MG [id number] Yes Take 1 tablet by mouth 2 (two) times daily. Yates Decamp, MD Taking Active   Semaglutide, 2 MG/DOSE, 8 MG/3ML SOPN  [id number] Yes Inject 2 mg as directed once a week. [name], MD Taking Active   vitamin B-12 (CYANOCOBALAMIN) 100 MCG tablet [id number] Yes Take 100 mcg by mouth daily. [provider] Taking Active   VYZULTA 0.024 % SOLN [id number] Yes Apply 1-2 drops to eye daily. [provider] Taking Active               Assessment/Plan:   Assisted Patient in completing the forms for [employer] Patient Assistance Program [place] CGM--the two lows on her report were determined to be compression lows. Fax forms to Star Age, CPhT with the Medication Assistance Team to complete the assistance process.  Follow Up Plan:    Call patient in 3-4 weeks to follow up on Patient Assistance.   Beecher Mcardle, PharmD, BCACP Clinical Pharmacist [phone]

## 2023-07-16 ENCOUNTER — Encounter: Payer: Medicare Other | Admitting: Cardiovascular Disease

## 2023-07-16 ENCOUNTER — Telehealth: Payer: Self-pay

## 2023-07-16 NOTE — Telephone Encounter (Signed)
 Patient notified her ozempic has arrived here at the office. She stated she will try to come pick it up tomorrow. YL,RMA

## 2023-07-20 DIAGNOSIS — E119 Type 2 diabetes mellitus without complications: Secondary | ICD-10-CM | POA: Diagnosis not present

## 2023-07-20 DIAGNOSIS — H401132 Primary open-angle glaucoma, bilateral, moderate stage: Secondary | ICD-10-CM | POA: Diagnosis not present

## 2023-07-20 DIAGNOSIS — Z794 Long term (current) use of insulin: Secondary | ICD-10-CM | POA: Diagnosis not present

## 2023-07-20 DIAGNOSIS — H2513 Age-related nuclear cataract, bilateral: Secondary | ICD-10-CM | POA: Diagnosis not present

## 2023-07-21 ENCOUNTER — Telehealth: Payer: Self-pay

## 2023-07-21 NOTE — Telephone Encounter (Signed)
   Patient Name: Yvonne Miller  DOB: May 04, 1951 MRN: [id number]  Primary Cardiologist: None  Chart reviewed as part of pre-operative protocol coverage. Cataract extractions are recognized in guidelines as low risk surgeries that do not typically require specific preoperative testing or holding of blood thinner therapy. Therefore, given past medical history and time since last visit, based on ACC/AHA guidelines, Yvonne Miller would be at acceptable risk for the planned procedure without further cardiovascular testing.   I will route this recommendation to the requesting party via Epic fax function and remove from pre-op pool.  Please call with questions.  Ronney Asters, NP 07/21/2023, 4:22 PM

## 2023-07-21 NOTE — Telephone Encounter (Signed)
   Pre-operative Risk Assessment    Patient Name: Yvonne Miller  DOB: 05/04/1951 MRN: [id number]   Date of last office visit: 01/15/23 Date of next office visit: 07/31/23   Request for Surgical Clearance    Procedure:   Cataract extraction with intraocular lens implant of the right eye followed by left eye on 10/08/23  Date of Surgery:  Clearance 09/17/23 right eye and 10/08/23 left eye                                 Surgeon:    Surgeon's Group or Practice Name:  [facility]  Phone number:  [phone] Fax number:  [phone]   Type of Clearance Requested:   - Medical    Type of Anesthesia:   Topical anesthesia with IV medication    Additional requests/questions:    Vance Peper   07/21/2023, 3:29 PM

## 2023-07-22 ENCOUNTER — Ambulatory Visit (INDEPENDENT_AMBULATORY_CARE_PROVIDER_SITE_OTHER): Payer: Medicare Other

## 2023-07-22 ENCOUNTER — Ambulatory Visit

## 2023-07-22 DIAGNOSIS — I428 Other cardiomyopathies: Secondary | ICD-10-CM | POA: Diagnosis not present

## 2023-07-22 DIAGNOSIS — Z0000 Encounter for general adult medical examination without abnormal findings: Secondary | ICD-10-CM | POA: Diagnosis not present

## 2023-07-22 NOTE — Progress Notes (Signed)
 Subjective:   Yvonne Miller is a 73 y.o. who presents for a Medicare Wellness preventive visit.  Visit Complete: Virtual I connected with  [name] on 07/22/23 by a audio enabled telemedicine application and verified that I am speaking with the correct person using two identifiers.  Patient Location: Home  Provider Location: Office/Clinic  I discussed the limitations of evaluation and management by telemedicine. The patient expressed understanding and agreed to proceed.  Vital Signs: Because this visit was a virtual/telehealth visit, some criteria may be missing or patient reported. Any vitals not documented were not able to be obtained and vitals that have been documented are patient reported.  VideoError- Librarian, academic were attempted between this provider and patient, however failed, due to patient having technical difficulties OR patient did not have access to video capability.  We continued and completed visit with audio only.   Persons Participating in Visit: Patient.  AWV Questionnaire: No: Patient Medicare AWV questionnaire was not completed prior to this visit.  Cardiac Risk Factors include: advanced age (>28men, >56 women);diabetes mellitus;hypertension     Objective:    Today's Vitals   There is no height or weight on file to calculate BMI.     07/22/2023   12:03 PM 07/23/2022    8:47 AM 07/03/2021    9:15 AM 11/14/2020    9:43 AM 06/21/2020    9:21 AM 09/24/2019   11:33 AM 06/16/2019    9:13 AM  Advanced Directives  Does Patient Have a Medical Advance Directive? Yes Yes Yes Yes Yes Yes Yes  Type of Estate agent of [place];Living will Healthcare Power of [place];Living will Healthcare Power of [place];Living will  Healthcare Power of [place];Living will Healthcare Power of [place];Living will Healthcare Power of [place];Living will  Copy of Healthcare Power of Attorney in Chart? Yes - validated most  recent copy scanned in chart (See row information) Yes - validated most recent copy scanned in chart (See row information) Yes - validated most recent copy scanned in chart (See row information)  Yes - validated most recent copy scanned in chart (See row information)  Yes - validated most recent copy scanned in chart (See row information)    Current Medications (verified) Outpatient Encounter Medications as of 07/22/2023  Medication Sig   acetaminophen (TYLENOL) 500 MG tablet Take 500 mg by mouth every 6 (six) hours as needed for moderate pain.   albuterol (PROVENTIL HFA;VENTOLIN HFA) 108 (90 Base) MCG/ACT inhaler Inhale 2 puffs into the lungs every 4 (four) hours as needed for wheezing or shortness of breath.   carvedilol (COREG) 25 MG tablet TAKE 1 AND 1/2 TABLETS BY MOUTH  TWICE DAILY   Cholecalciferol (VITAMIN D) 50 MCG (2000 UT) tablet Take 2,000 Units by mouth daily.   Continuous Glucose Sensor (FREESTYLE LIBRE 3 PLUS SENSOR) MISC Change sensor every 15 days.   dapagliflozin propanediol (FARXIGA) 10 MG TABS tablet Take 1 tablet (10 mg total) by mouth daily.   famotidine (PEPCID) 20 MG tablet TAKE 1 TABLET BY MOUTH DAILY AS  NEEDED   fluticasone (FLONASE) 50 MCG/ACT nasal spray Place 1 spray into both nostrils daily. (Patient taking differently: Place 1 spray into both nostrils as needed for allergies.)   furosemide (LASIX) 40 MG tablet TAKE 1 TABLET BY MOUTH EVERY DAY IN THE MORNING   hydrALAZINE (APRESOLINE) 50 MG tablet TAKE 1 TABLET BY MOUTH TWICE  DAILY   hydrocortisone 2.5 % cream Apply 1 Application topically every  other day.   hydroquinone 4 % cream Apply topically once a week.   insulin lispro protamine-lispro (HUMALOG 75/25 MIX) (75-25) 100 UNIT/ML SUSP injection Inject 10 Units into the skin 2 (two) times daily with a meal.   ketoconazole (NIZORAL) 2 % shampoo Apply 1 Application topically 2 (two) times a week.   nystatin (NYSTATIN) powder Apply topically 3 (three) times daily as  needed. As needed   omeprazole (PRILOSEC) 40 MG capsule TAKE 1 CAPSULE BY MOUTH DAILY  BEFORE BREAKFAST   pravastatin (PRAVACHOL) 40 MG tablet Take 1 tablet (40 mg total) by mouth daily.   Probiotic Product (PROBIOTIC PO) Take 1 capsule by mouth at bedtime.   pyridOXINE (VITAMIN B6) 25 MG tablet Take 25 mg by mouth daily.   sacubitril-valsartan (ENTRESTO) 49-51 MG Take 1 tablet by mouth 2 (two) times daily.   Semaglutide, 2 MG/DOSE, 8 MG/3ML SOPN Inject 2 mg as directed once a week.   vitamin B-12 (CYANOCOBALAMIN) 100 MCG tablet Take 100 mcg by mouth daily.   VYZULTA 0.024 % SOLN Apply 1-2 drops to eye daily.   cephALEXin (KEFLEX) 500 MG capsule One tab po twice daily x 7 days (Patient not taking: Reported on 07/22/2023)   Cinnamon 500 MG capsule Take 500 mg by mouth daily. (Patient not taking: Reported on 07/22/2023)   No facility-administered encounter medications on file as of 07/22/2023.    Allergies (verified) Other   History: Past Medical History:  Diagnosis Date   AICD (automatic cardioverter/defibrillator) present    Arthritis    Asthma    Carpal tunnel syndrome, bilateral    CKD (chronic kidney disease)    Diabetes mellitus    Encounter for assessment of implantable cardioverter-defibrillator (ICD)    GERD (gastroesophageal reflux disease)    Hyperlipemia    Hypertension    Obesity    morbid   Sleep apnea    wears CPAP set at 12   Wears glasses    Past Surgical History:  Procedure Laterality Date   ABDOMINAL HYSTERECTOMY     BIOPSY  03/01/2019   Procedure: BIOPSY;  Surgeon: Charna Elizabeth, MD;  Location: [facility];  Service: Endoscopy;;   BREAST SURGERY  1968   breast mass excision   CARPAL TUNNEL RELEASE Left 07/10/2017   Procedure: CARPAL TUNNEL RELEASE;  Surgeon: Coletta Memos, MD;  Location: [facility] OR;  Service: Neurosurgery;  Laterality: Left;  left   CARPAL TUNNEL RELEASE Right 03/19/2018   Procedure: RIGHT CARPAL TUNNEL RELEASE;  Surgeon: Coletta Memos, MD;   Location: [facility] OR;  Service: Neurosurgery;  Laterality: Right;  right   CERVICAL SPINE SURGERY  2006   CHOLECYSTECTOMY     COLONOSCOPY WITH PROPOFOL N/A 03/01/2019   Procedure: COLONOSCOPY WITH PROPOFOL;  Surgeon: Charna Elizabeth, MD;  Location: [facility];  Service: Endoscopy;  Laterality: N/A;   DILATION AND CURETTAGE OF UTERUS     ICD IMPLANT     JOINT REPLACEMENT     TONSILLECTOMY  1961   TOTAL KNEE ARTHROPLASTY Left 01/26/2018   Procedure: LEFT TOTAL KNEE ARTHROPLASTY;  Surgeon: Kathryne Hitch, MD;  Location: [facility] OR;  Service: Orthopedics;  Laterality: Left;   trigger fnger Left 11/2022   Family History  Problem Relation Age of Onset   Hypertension Mother    Stroke Mother    Heart disease Father    Heart attack Sister    Heart attack Brother    Social History   Socioeconomic History   Marital status: Married  Spouse name: Not on file   Number of children: 2   Years of education: Not on file   Highest education level: Not on file  Occupational History   Occupation: retired  Tobacco Use   Smoking status: Never   Smokeless tobacco: Never   Tobacco comments:    She smoked while in college.   Vaping Use   Vaping status: Never Used  Substance and Sexual Activity   Alcohol use: No   Drug use: No   Sexual activity: Yes  Other Topics Concern   Not on file  Social History Narrative   Not on file   Social Drivers of Health   Financial Resource Strain: Low Risk  (07/22/2023)   Overall Financial Resource Strain (CARDIA)    Difficulty of Paying Living Expenses: Not hard at all  Food Insecurity: No Food Insecurity (07/22/2023)   Hunger Vital Sign    Worried About Running Out of Food in the Last Year: Never true    Ran Out of Food in the Last Year: Never true  Transportation Needs: No Transportation Needs (07/22/2023)   PRAPARE - Administrator, Civil Service (Medical): No    Lack of Transportation (Non-Medical): No  Physical Activity: Inactive  (07/22/2023)   Exercise Vital Sign    Days of Exercise per Week: 0 days    Minutes of Exercise per Session: 0 min  Stress: No Stress Concern Present (07/22/2023)   [name] of Occupational Health - Occupational Stress Questionnaire    Feeling of Stress : Not at all  Social Connections: Socially Integrated (07/22/2023)   Social Connection and Isolation Panel [NHANES]    Frequency of Communication with Friends and Family: More than three times a week    Frequency of Social Gatherings with Friends and Family: More than three times a week    Attends Religious Services: More than 4 times per year    Active Member of [employer] or Organizations: Yes    Attends Engineer, structural: More than 4 times per year    Marital Status: Married    Tobacco Counseling Counseling given: Not Answered Tobacco comments: She smoked while in college.     Clinical Intake:  Pre-visit preparation completed: Yes  Pain : No/denies pain     Nutritional Risks: None Diabetes: Yes CBG done?: No Did pt. bring in CBG monitor from home?: No  Lab Results  Component Value Date   HGBA1C 7.1 (H) 07/07/2023   HGBA1C 6.8 (H) 03/10/2023   HGBA1C 6.9 (H) 10/30/2022     How often do you need to have someone help you when you read instructions, pamphlets, or other written materials from your doctor or pharmacy?: 1 - Never  Interpreter Needed?: No  Information entered by :: NAllen LPN   Activities of Daily Living     07/22/2023   11:56 AM 07/23/2022    8:48 AM  In your present state of health, do you have any difficulty performing the following activities:  Hearing? 0 0  Vision? 1 0  Comment cataracts on right eye   Difficulty concentrating or making decisions? 0 0  Walking or climbing stairs? 0 0  Dressing or bathing? 0 0  Doing errands, shopping? 0 0  Preparing Food and eating ? N N  Using the Toilet? N N  In the past six months, have you accidently leaked urine? N N  Do you have  problems with loss of bowel control? N N  Managing your Medications?  N N  Managing your Finances? N N  Housekeeping or managing your Housekeeping? N N    Patient Care Team: [name], MD as PCP - General (Internal Medicine)  Indicate any recent Medical Services you may have received from other than [facility] providers in the past year (date may be approximate).     Assessment:   This is a routine wellness examination for [place].  Hearing/Vision screen Hearing Screening - Comments:: Denies hearing issues Vision Screening - Comments:: Regular eye exams, Dr. Shea Evans   Goals Addressed             This Visit's Progress    Patient Stated       07/22/2023, wants to find a new gym       Depression Screen     07/22/2023   12:05 PM 03/10/2023    8:32 AM 07/23/2022    8:48 AM 06/19/2022    8:26 AM 07/03/2021    9:16 AM 11/22/2020    8:51 AM 06/21/2020    9:22 AM  PHQ 2/9 Scores  PHQ - 2 Score 0 0 0 0 0 0 0  PHQ- 9 Score 0 0         Fall Risk     07/22/2023   12:05 PM 03/10/2023    8:32 AM 07/23/2022    8:48 AM 06/19/2022    8:26 AM 07/03/2021    9:16 AM  Fall Risk   Falls in the past year? 0 0 0 0 0  Number falls in past yr: 0 0 0 0   Injury with Fall? 0 0 0 0   Risk for fall due to : Medication side effect No Fall Risks Medication side effect No Fall Risks Medication side effect  Follow up Falls prevention discussed;Falls evaluation completed Falls evaluation completed Falls prevention discussed;Education provided;Falls evaluation completed Falls evaluation completed Falls evaluation completed;Education provided;Falls prevention discussed    MEDICARE RISK AT HOME:  Medicare Risk at Home Any stairs in or around the home?: Yes If so, are there any without handrails?: No Home free of loose throw rugs in walkways, pet beds, electrical cords, etc?: Yes Adequate lighting in your home to reduce risk of falls?: Yes Life alert?: No Use of a cane, walker or w/c?: No Grab bars in the  bathroom?: No Shower chair or bench in shower?: No Elevated toilet seat or a handicapped toilet?: Yes  TIMED UP AND GO:  Was the test performed?  No  Cognitive Function: 6CIT completed        07/22/2023   12:06 PM 07/23/2022    8:49 AM 07/03/2021    9:17 AM 06/21/2020    9:24 AM 06/16/2019    9:16 AM  6CIT Screen  What Year? 0 points 0 points 0 points 0 points 0 points  What month? 0 points 0 points 0 points 0 points 0 points  What time? 0 points 0 points 0 points 0 points 0 points  Count back from 20 0 points 0 points 0 points 0 points 0 points  Months in reverse 0 points 0 points 0 points 0 points 0 points  Repeat phrase 0 points 0 points 0 points 4 points 0 points  Total Score 0 points 0 points 0 points 4 points 0 points    Immunizations Immunization History  Administered Date(s) Administered   Fluad Quad(high Dose 65+) 02/07/2020, 02/12/2021, 03/18/2022, 02/14/2023   Hepatitis A 01/19/2012, 02/16/2012   Hepatitis B 01/19/2012, 02/16/2012   IPV 02/16/2012  Influenza, High Dose Seasonal PF 02/17/2016, 03/04/2018, 02/10/2019   Influenza-Unspecified 02/02/2014, 02/16/2014   MMR 04/04/1994   PFIZER(Purple Top)SARS-COV-2 Vaccination 06/11/2019, 07/02/2019, 02/21/2020, 03/03/2022   Pfizer Covid-19 Vaccine Bivalent Booster 2yrs & up 03/20/2021, 02/14/2023   Pneumococcal Conjugate-13 05/16/2020   Pneumococcal Polysaccharide-23 06/18/2021   Td 09/08/2006   Tdap 01/19/2012, 06/21/2020   Zoster Recombinant(Shingrix) 06/04/2021, 10/21/2021   Zoster, Live 12/15/2007    Screening Tests Health Maintenance  Topic Date Due   COVID-19 Vaccine (7 - 2024-25 season) 04/11/2023   Medicare Annual Wellness (AWV)  07/23/2023   FOOT EXAM  10/30/2023   Diabetic kidney evaluation - Urine ACR  12/25/2023   OPHTHALMOLOGY EXAM  12/30/2023   MAMMOGRAM  01/01/2024   HEMOGLOBIN A1C  01/07/2024   Diabetic kidney evaluation - eGFR measurement  07/06/2024   Colonoscopy  02/28/2029    DTaP/Tdap/Td (4 - Td or Tdap) 06/21/2030   Pneumonia Vaccine 28+ Years old  Completed   INFLUENZA VACCINE  Completed   DEXA SCAN  Completed   Hepatitis C Screening  Completed   Zoster Vaccines- Shingrix  Completed   HPV VACCINES  Aged Out    Health Maintenance  Health Maintenance Due  Topic Date Due   COVID-19 Vaccine (7 - 2024-25 season) 04/11/2023   Medicare Annual Wellness (AWV)  07/23/2023   Health Maintenance Items Addressed: Up to date  Additional Screening:  Vision Screening: Recommended annual ophthalmology exams for early detection of glaucoma and other disorders of the eye.  Dental Screening: Recommended annual dental exams for proper oral hygiene  Community Resource Referral / Chronic Care Management: CRR required this visit?  No   CCM required this visit?  No     Plan:     I have personally reviewed and noted the following in the patient's chart:   Medical and social history Use of alcohol, tobacco or illicit drugs  Current medications and supplements including opioid prescriptions. Patient is not currently taking opioid prescriptions. Functional ability and status Nutritional status Physical activity Advanced directives List of other physicians Hospitalizations, surgeries, and ER visits in previous 12 months Vitals Screenings to include cognitive, depression, and falls Referrals and appointments  In addition, I have reviewed and discussed with patient certain preventive protocols, quality metrics, and best practice recommendations. A written personalized care plan for preventive services as well as general preventive health recommendations were provided to patient.     Barb Merino, LPN   1/61/0960   After Visit Summary: (MyChart) Due to this being a telephonic visit, the after visit summary with patients personalized plan was offered to patient via MyChart   Notes: Nothing significant to report at this time.

## 2023-07-22 NOTE — Patient Instructions (Signed)
 Yvonne Miller , Thank you for taking time to come for your Medicare Wellness Visit. I appreciate your ongoing commitment to your health goals. Please review the following plan we discussed and let me know if I can assist you in the future.   Referrals/Orders/Follow-Ups/Clinician Recommendations: none  This is a list of the screening recommended for you and due dates:  Health Maintenance  Topic Date Due   COVID-19 Vaccine (7 - 2024-25 season) 04/11/2023   Medicare Annual Wellness Visit  07/23/2023   Complete foot exam   10/30/2023   Yearly kidney health urinalysis for diabetes  12/25/2023   Eye exam for diabetics  12/30/2023   Mammogram  01/01/2024   Hemoglobin A1C  01/07/2024   Yearly kidney function blood test for diabetes  07/06/2024   Colon Cancer Screening  02/28/2029   DTaP/Tdap/Td vaccine (4 - Td or Tdap) 06/21/2030   Pneumonia Vaccine  Completed   Flu Shot  Completed   DEXA scan (bone density measurement)  Completed   Hepatitis C Screening  Completed   Zoster (Shingles) Vaccine  Completed   HPV Vaccine  Aged Out    Advanced directives: (In Chart) A copy of your advanced directives are scanned into your chart should your provider ever need it.  Next Medicare Annual Wellness Visit scheduled for next year: No, office will schedule  insert Preventive Care attachment Insert FALL PREVENTION attachment if needed

## 2023-07-23 LAB — CUP PACEART REMOTE DEVICE CHECK
Battery Remaining Longevity: 3 mo — CL
Battery Remaining Percentage: 3 %
Brady Statistic RA Percent Paced: 2 %
Brady Statistic RV Percent Paced: 100 %
Date Time Interrogation Session: 20250319044000
HighPow Impedance: 87 Ohm
Implantable Lead Connection Status: 753985
Implantable Lead Connection Status: 753985
Implantable Lead Connection Status: 753985
Implantable Lead Location: 753858
Implantable Lead Location: 753859
Implantable Lead Location: 753860
Implantable Lead Model: 4674
Implantable Lead Model: 5076
Implantable Lead Model: 692
Implantable Lead Serial Number: 501753
Implantable Lead Serial Number: 503110
Lead Channel Impedance Value: 1295 Ohm
Lead Channel Impedance Value: 378 Ohm
Lead Channel Impedance Value: 828 Ohm
Lead Channel Pacing Threshold Amplitude: 0.6 V
Lead Channel Pacing Threshold Amplitude: 0.8 V
Lead Channel Pacing Threshold Amplitude: 2.4 V
Lead Channel Pacing Threshold Pulse Width: 0.5 ms
Lead Channel Pacing Threshold Pulse Width: 0.5 ms
Lead Channel Pacing Threshold Pulse Width: 0.5 ms
Lead Channel Setting Pacing Amplitude: 2 V
Lead Channel Setting Pacing Amplitude: 2 V
Lead Channel Setting Pacing Amplitude: 3.5 V
Lead Channel Setting Pacing Pulse Width: 0.5 ms
Lead Channel Setting Pacing Pulse Width: 0.5 ms
Lead Channel Setting Sensing Sensitivity: 0.6 mV
Lead Channel Setting Sensing Sensitivity: 1 mV
Pulse Gen Serial Number: 133503

## 2023-07-29 ENCOUNTER — Encounter: Payer: Self-pay | Admitting: Cardiovascular Disease

## 2023-07-30 NOTE — Progress Notes (Unsigned)
 Cardiology Office Note:  .   Date:  07/31/2023  ID:  Yvonne MICHELS, DOB 05-Sep-1950, MRN [id number] PCP: [name], MD  [facility] Providers Cardiologist:  Yates Decamp, MD   History of Present Illness: [name]   Yvonne Miller is a 73 y.o. 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  She also has morbid obesity, OSA not on CPAP recently, hypertension, DM with stage 3b CKD, hyperlipidemia, degenerative joint disease and diabetic gastroparesis.  6 months ago, she had new onset recurrence of cardiomyopathy with low EF and I initiated Entresto.  She underwent echocardiogram and presents for follow-up.  Discussed the use of AI scribe software for clinical note transcription with the patient, who gave verbal consent to proceed.  History of Present Illness The patient, with a history of heart disease and kidney infections, presents for a follow-up visit. She reports that she is less stressed and is doing well on her current medications, Entresto and Farxiga. She had a kidney infection in December but has only had one since then. She is prone to kidney infections and is taking precautions to prevent them.   She also mentions that she is trying to increase her water intake and only drinks water and coffee. She is scheduled to have a cataract surgery and is considering dental implants. She also mentions that her pacemaker is nearing the end of its life.  Labs   Lab Results  Component Value Date   CHOL 144 07/07/2023   HDL 41 07/07/2023   LDLCALC 67 07/07/2023   TRIG 221 (H) 07/07/2023   CHOLHDL 3.5 07/07/2023   Lab Results  Component Value Date   NA 141 07/07/2023   K 4.6 07/07/2023   CO2 22 07/07/2023   GLUCOSE 157 (H) 07/07/2023   BUN 25 07/07/2023   CREATININE 1.36 (H) 07/07/2023   CALCIUM 9.1 07/07/2023   GFR 44.45 (L) 02/10/2023    EGFR 41 (L) 07/07/2023   GFRNONAA 34 (L) 11/14/2020      Latest Ref Rng & Units 07/07/2023    9:59 AM 02/10/2023    2:37 PM 10/30/2022    9:33 AM  BMP  Glucose 70 - 99 mg/dL 284  132  97   BUN 8 - 27 mg/dL 25  21  30    Creatinine 0.57 - 1.00 mg/dL 4.40  1.02  7.25   BUN/Creat Ratio 12 - 28 18   22    Sodium 134 - 144 mmol/L 141  141  138   Potassium 3.5 - 5.2 mmol/L 4.6  4.3  4.4   Chloride 96 - 106 mmol/L 102  105  99   CO2 20 - 29 mmol/L 22  27  26    Calcium 8.7 - 10.3 mg/dL 9.1  8.8  36.6       Latest Ref Rng & Units 07/07/2023    9:59 AM 02/10/2023    2:07 PM 10/30/2022    9:33 AM  CBC  WBC 3.4 - 10.8 x10E3/uL 8.6  7.8  9.5   Hemoglobin 11.1 - 15.9 g/dL 44.0  34.7  42.5   Hematocrit 34.0 - 46.6 % 39.2  37.0  37.9   Platelets 150 - 450 x10E3/uL 352  325.0  340    Lab Results  Component Value Date   HGBA1C 7.1 (H) 07/07/2023    Lab Results  Component Value Date   TSH  1.290 10/21/2021     Review of Systems  Cardiovascular:  Negative for chest pain, dyspnea on exertion and leg swelling.  Physical Exam:   VS:  BP 122/70 (BP Location: Left Arm, Patient Position: Sitting, Cuff Size: Large)   Pulse 68   Resp 14   Ht 5\' 4"  (1.626 m)   Wt 235 lb (106.6 kg)   SpO2 97%   BMI 40.34 kg/m    Wt Readings from Last 3 Encounters:  07/31/23 235 lb (106.6 kg)  07/07/23 235 lb 12.8 oz (107 kg)  07/02/23 236 lb 6.4 oz (107.2 kg)    Physical Exam Constitutional:      Appearance: She is morbidly obese.  Neck:     Vascular: No carotid bruit or JVD.  Cardiovascular:     Rate and Rhythm: Normal rate and regular rhythm.     Pulses: Intact distal pulses.     Heart sounds: Normal heart sounds. No murmur heard.    No gallop.  Pulmonary:     Effort: Pulmonary effort is normal.     Breath sounds: Normal breath sounds.  Abdominal:     General: Bowel sounds are normal.     Palpations: Abdomen is soft.  Musculoskeletal:     Right lower leg: No edema.     Left lower leg: No edema.   Studies Reviewed: [name]    ICD Remote 07/28/2023: Remote Defibrillator interrogation reviewed. Presenting Rhythm:A-sensed V-paced. Battery and lead parameters stable with stable capture and sensing. Device reaching ERI. Device programming is appropriate. No arrhythmias noted. Continue remote monitoring.   ECHOCARDIOGRAM COMPLETE 07/15/2023  1. Left ventricular ejection fraction, by estimation, is 60 to 65%. Left ventricular ejection fraction by 3D volume is 54 %. The left ventricle has normal function. The left ventricle has no regional wall motion abnormalities. There is mild concentric left ventricular hypertrophy. Left ventricular diastolic parameters are consistent with Grade I diastolic dysfunction (impaired relaxation). The average left ventricular global longitudinal strain is -21.9 %. The global longitudinal strain is normal. 2. Right ventricular systolic function is normal. The right ventricular size is normal. 3. Left atrial size was mildly dilated. 4. The mitral valve is normal in structure. Trivial mitral valve regurgitation. No evidence of mitral stenosis. 5. The aortic valve is tricuspid. There is mild calcification of the aortic valve. Aortic valve regurgitation is trivial. Aortic valve sclerosis/calcification is present, without any evidence of aortic stenosis. 6. The inferior vena cava is normal in size with greater than 50% respiratory variability, suggesting right atrial pressure of 3 mmHg. 7.  Compared to 01/07/2023  EF has improved from 35 to 40%.  EKG:    EKG 01/15/2023: Atrially sensed and biventricularly paced rhythm at the rate of 83 bpm. No significant change from 07/17/2022.   Medications and allergies    Allergies  Allergen Reactions  . Other Other (See Comments)    NO BLOOD   . Jehovah witness     Current Outpatient Medications:  .  acetaminophen (TYLENOL) 500 MG tablet, Take 500 mg by mouth every 6 (six) hours as needed for moderate pain., Disp: , Rfl:  .   albuterol (PROVENTIL HFA;VENTOLIN HFA) 108 (90 Base) MCG/ACT inhaler, Inhale 2 puffs into the lungs every 4 (four) hours as needed for wheezing or shortness of breath., Disp: , Rfl:  .  carvedilol (COREG) 25 MG tablet, TAKE 1 AND 1/2 TABLETS BY MOUTH  TWICE DAILY, Disp: 300 tablet, Rfl: 2 .  cephALEXin (KEFLEX) 500 MG capsule, One tab  po twice daily x 7 days, Disp: 14 capsule, Rfl: 0 .  Cholecalciferol (VITAMIN D) 50 MCG (2000 UT) tablet, Take 2,000 Units by mouth daily., Disp: , Rfl:  .  Continuous Glucose Sensor (FREESTYLE LIBRE 3 PLUS SENSOR) MISC, Change sensor every 15 days., Disp: 6 each, Rfl: 3 .  dapagliflozin propanediol (FARXIGA) 10 MG TABS tablet, Take 1 tablet (10 mg total) by mouth daily., Disp: 30 tablet, Rfl: 2 .  famotidine (PEPCID) 20 MG tablet, TAKE 1 TABLET BY MOUTH DAILY AS  NEEDED, Disp: 100 tablet, Rfl: 2 .  fluticasone (FLONASE) 50 MCG/ACT nasal spray, Place 1 spray into both nostrils daily. (Patient taking differently: Place 1 spray into both nostrils as needed for allergies.), Disp: 16 g, Rfl: 2 .  furosemide (LASIX) 40 MG tablet, TAKE 1 TABLET BY MOUTH EVERY DAY IN THE MORNING, Disp: 90 tablet, Rfl: 3 .  hydrALAZINE (APRESOLINE) 50 MG tablet, TAKE 1 TABLET BY MOUTH TWICE  DAILY, Disp: 200 tablet, Rfl: 2 .  hydrocortisone 2.5 % cream, Apply 1 Application topically every other day., Disp: , Rfl:  .  hydroquinone 4 % cream, Apply topically once a week., Disp: , Rfl:  .  insulin lispro protamine-lispro (HUMALOG 75/25 MIX) (75-25) 100 UNIT/ML SUSP injection, Inject 10 Units into the skin 2 (two) times daily with a meal., Disp: , Rfl:  .  ketoconazole (NIZORAL) 2 % shampoo, Apply 1 Application topically 2 (two) times a week., Disp: , Rfl:  .  nystatin (NYSTATIN) powder, Apply topically 3 (three) times daily as needed. As needed, Disp: 60 g, Rfl: 2 .  omeprazole (PRILOSEC) 40 MG capsule, TAKE 1 CAPSULE BY MOUTH DAILY  BEFORE BREAKFAST, Disp: 100 capsule, Rfl: 1 .  pravastatin  (PRAVACHOL) 40 MG tablet, Take 1 tablet (40 mg total) by mouth daily., Disp: 100 tablet, Rfl: 2 .  Probiotic Product (PROBIOTIC PO), Take 1 capsule by mouth at bedtime., Disp: , Rfl:  .  pyridOXINE (VITAMIN B6) 25 MG tablet, Take 25 mg by mouth daily., Disp: , Rfl:  .  sacubitril-valsartan (ENTRESTO) 49-51 MG, Take 1 tablet by mouth 2 (two) times daily., Disp: 180 tablet, Rfl: 3 .  Semaglutide, 2 MG/DOSE, 8 MG/3ML SOPN, Inject 2 mg as directed once a week., Disp: 3 mL, Rfl: 2 .  vitamin B-12 (CYANOCOBALAMIN) 100 MCG tablet, Take 100 mcg by mouth daily., Disp: , Rfl:  .  VYZULTA 0.024 % SOLN, Apply 1-2 drops to eye daily., Disp: , Rfl:    ASSESSMENT AND PLAN: .      ICD-10-CM   1. Non-ischemic cardiomyopathy (HCC)  I42.8     2. Heart failure with improved ejection fraction (HFimpEF) (HCC)  I50.32     3. Essential hypertension  I10       No orders of the defined types were placed in this encounter.   Medications Discontinued During This Encounter  Medication Reason  . Cinnamon 500 MG capsule Patient Preference    Assessment & Plan Non-ischemic cardiomyopathy   Heart function has improved from moderate decreased function last year to normal, with an ejection fraction of 60-65%. This improvement is attributed to carvedilol, hydralazine, Entresto, and Farxiga. There is no evidence of heart failure, and the heart is pacing well with no irregular rhythms. The condition is non-ischemic, not related to coronary artery blockages. Continue current medications and monitor heart function and pacemaker data.  Pacemaker management   The pacemaker is nearing the end of its life but is functioning well with no  irregular heart rhythms. She is scheduled to see another doctor for pacemaker management today. The new ICD will provide more data than the current one. Coordinate with the pacemaker specialist for further management and plan for pacemaker replacement.  Hypertension   Blood pressure is well  controlled at 122/70 mmHg. The current medication regimen is effective. Continue current antihypertensive medications.   Dental procedure management   Antibiotics are not necessary for her dental procedures, including potential implants. Inform dental provider accordingly.  Follow-up   She is doing well with her current treatment plan and will be seen again in a year unless issues arise. Schedule follow-up appointment in one year.   Signed,  Yates Decamp, MD, [facility] 07/31/2023, 8:23 AM [facility] [address] [place], [place] [address] Phone: [phone]. Fax:  [phone]

## 2023-07-31 ENCOUNTER — Ambulatory Visit: Payer: Medicare Other | Attending: Internal Medicine | Admitting: Cardiology

## 2023-07-31 ENCOUNTER — Encounter: Payer: Self-pay | Admitting: Cardiology

## 2023-07-31 ENCOUNTER — Ambulatory Visit (INDEPENDENT_AMBULATORY_CARE_PROVIDER_SITE_OTHER): Payer: Medicare Other | Admitting: Cardiovascular Disease

## 2023-07-31 VITALS — BP 122/70 | HR 68 | Resp 14 | Ht 64.0 in | Wt 235.0 lb

## 2023-07-31 DIAGNOSIS — I428 Other cardiomyopathies: Secondary | ICD-10-CM

## 2023-07-31 DIAGNOSIS — I5032 Chronic diastolic (congestive) heart failure: Secondary | ICD-10-CM

## 2023-07-31 DIAGNOSIS — I10 Essential (primary) hypertension: Secondary | ICD-10-CM | POA: Diagnosis not present

## 2023-07-31 NOTE — Progress Notes (Signed)
  Electrophysiology Office Note:    Date:  07/31/2023   ID:  Yvonne Miller, DOB 1950-11-14, MRN [id number]  PCP:  [name], MD   [facility] Providers Cardiologist:  Yates Decamp, MD Electrophysiologist:  Maurice Small, MD     Referring MD: [name], MD   History of Present Illness:    Yvonne Miller is a 73 y.o. female with a medical history significant for nonischemic cardiomyopathy, BiV ICD referred for device management.     She has a Environmental manager BiV ICD with a Medtronic 5076 in the right atrium with [employer] leads implanted October 27, 2013 at [place] [facility].  After CRT, her LV function has normalized.      Today, she reports that she is doing well. she has no device related complaints -- no new tenderness, drainage, redness.   EKGs/Labs/Other Studies Reviewed Today:     Echocardiogram:  TTE July 15, 2023 EF 60 to 65%.  Mildly dilated left atrium.    EKG:   EKG Interpretation Date/Time:  Friday July 31 2023 08:43:39 EDT Ventricular Rate:  79 PR Interval:  138 QRS Duration:  132 QT Interval:  436 QTC Calculation: 499 R Axis:   -72  Text Interpretation: Atrial-sensed ventricular-paced rhythm Biventricular pacemaker detected When compared with ECG of 01-Jun-2017 10:05, Vent. rate has increased BY  13 BPM Confirmed by York Pellant [phone]) on 07/31/2023 9:22:40 AM     Physical Exam:    VS:  There were no vitals taken for this visit.   VS taken earlier at Dr. Verl Dicker visit have been reviewed  Wt Readings from Last 3 Encounters:  07/31/23 235 lb (106.6 kg)  07/07/23 235 lb 12.8 oz (107 kg)  07/02/23 236 lb 6.4 oz (107.2 kg)     GEN: Well nourished, well developed in no acute distress CARDIAC: RRR, no murmurs, rubs, gallops The device site is normal -- no tenderness, edema, drainage, redness, threatened erosion.  RESPIRATORY:  Normal work of breathing MUSCULOSKELETAL: no edema    ASSESSMENT & PLAN:     [place]  [employer] BiV ICD Device interrogation reviewed today --normal function She has Pharmacologist leads and a Medtronic right atrial lead See Paceart report for details Generator is approaching ERI will make arrangements for generator change in the near future  I explained the indication for generator change and logistics of the procedure.  I explained risk of infection, minor bleeding, damage to the existing leads which could require placement of new leads.  The risk of significant complications such as need for cardiac surgery, major bleeding, death are very low.  Jehovah's Witness She will not receive blood products      Signed, Maurice Small, MD  07/31/2023 9:37 AM    [facility]

## 2023-07-31 NOTE — Patient Instructions (Signed)
 Medication Instructions:  Your physician recommends that you continue on your current medications as directed. Please refer to the Current Medication list given to you today.  *If you need a refill on your cardiac medications before your next appointment, please call your pharmacy*  Lab Work: none If you have labs (blood work) drawn today and your tests are completely normal, you will receive your results only by: MyChart Message (if you have MyChart) OR A paper copy in the mail If you have any lab test that is abnormal or we need to change your treatment, we will call you to review the results.  Testing/Procedures: none  Follow-Up: At [facility], you and your health needs are our priority.  As part of our continuing mission to provide you with exceptional heart care, our providers are all part of one team.  This team includes your primary Cardiologist (physician) and Advanced Practice Providers or APPs (Physician Assistants and Nurse Practitioners) who all work together to provide you with the care you need, when you need it.  Your next appointment:   12 month(s)  Provider:   Yates Decamp, MD     We recommend signing up for the patient portal called "MyChart".  Sign up information is provided on this After Visit Summary.  MyChart is used to connect with patients for Virtual Visits (Telemedicine).  Patients are able to view lab/test results, encounter notes, upcoming appointments, etc.  Non-urgent messages can be sent to your provider as well.   To learn more about what you can do with MyChart, go to [email or web address].   Other Instructions        1st Floor: - Lobby - Registration  - Pharmacy  - Lab - Cafe  2nd Floor: - PV Lab - Diagnostic Testing (echo, CT, nuclear med)  3rd Floor: - Vacant  4th Floor: - TCTS (cardiothoracic surgery) - AFib Clinic - Structural Heart Clinic - Vascular Surgery  - Vascular Ultrasound  5th Floor: - HeartCare Cardiology  (general and EP) - Clinical Pharmacy for coumadin, hypertension, lipid, weight-loss medications, and med management appointments    Valet parking services will be available as well.

## 2023-07-31 NOTE — Patient Instructions (Signed)
 Medication Instructions:  Your physician recommends that you continue on your current medications as directed. Please refer to the Current Medication list given to you today. *If you need a refill on your cardiac medications before your next appointment, please call your pharmacy*   Testing/Procedures: ICD generator change - we will contact you closer to time to schedule your battery change  Follow-Up: At [facility], you and your health needs are our priority.  As part of our continuing mission to provide you with exceptional heart care, our providers are all part of one team.  This team includes your primary Cardiologist (physician) and Advanced Practice Providers or APPs (Physician Assistants and Nurse Practitioners) who all work together to provide you with the care you need, when you need it.  Your next appointment:   1 year(s)  Provider:   York Pellant, MD  We recommend signing up for the patient portal called "MyChart".  Sign up information is provided on this After Visit Summary.  MyChart is used to connect with patients for Virtual Visits (Telemedicine).  Patients are able to view lab/test results, encounter notes, upcoming appointments, etc.  Non-urgent messages can be sent to your provider as well.   To learn more about what you can do with MyChart, go to [email or web address].         1st Floor: - Lobby - Registration  - Pharmacy  - Lab - Cafe  2nd Floor: - PV Lab - Diagnostic Testing (echo, CT, nuclear med)  3rd Floor: - Vacant  4th Floor: - TCTS (cardiothoracic surgery) - AFib Clinic - Structural Heart Clinic - Vascular Surgery  - Vascular Ultrasound  5th Floor: - HeartCare Cardiology (general and EP) - Clinical Pharmacy for coumadin, hypertension, lipid, weight-loss medications, and med management appointments    Valet parking services will be available as well.

## 2023-08-08 ENCOUNTER — Other Ambulatory Visit: Payer: Self-pay | Admitting: Internal Medicine

## 2023-08-19 ENCOUNTER — Encounter: Payer: Self-pay | Admitting: Internal Medicine

## 2023-08-19 ENCOUNTER — Telehealth: Payer: Self-pay | Admitting: Pharmacist

## 2023-08-19 DIAGNOSIS — Z794 Long term (current) use of insulin: Secondary | ICD-10-CM

## 2023-08-19 NOTE — Progress Notes (Addendum)
   08/19/2023  Patient ID: Yvonne Miller, female   DOB: 28-Jul-1950, 73 y.o.   MRN: [id number]  Called [employer] about the Patient's [employer] application. Unfortunately, it had not been processed.  The application was faxed over to the Medication Patient Assistance Team but the Provider was outside of [facility] and not part of the network.  As such, it probably was never sent to the program for processing.    Dr. Elnita Hai said she would be willing to sign the prescriber's portion of the application for the patient.  Patient was called as the annual household income was not entered on the application.    Unfortunately, she did not answer the phone.   HIPAA compliant message was left on the Patient's voicemail.  Will follow up in 1 week to redo the application when I am in clinic again.   Geronimo Krabbe, PharmD, BCACP Clinical Pharmacist [phone]   ADDENDUM--  Patient called me back.  HIPAA identifiers were obtained.  She clarified the financial question I had for the application.  The application will be given to Dr. Elnita Hai to sign and then will be faxed to [facility].  .=

## 2023-08-19 NOTE — Addendum Note (Signed)
 Addended by: Geronimo Krabbe on: 08/19/2023 03:10 PM   Modules accepted: Orders

## 2023-08-24 ENCOUNTER — Ambulatory Visit

## 2023-08-24 DIAGNOSIS — I428 Other cardiomyopathies: Secondary | ICD-10-CM

## 2023-08-24 DIAGNOSIS — I5022 Chronic systolic (congestive) heart failure: Secondary | ICD-10-CM

## 2023-08-25 LAB — CUP PACEART REMOTE DEVICE CHECK
Battery Remaining Longevity: 3 mo — CL
Battery Remaining Percentage: 0 %
Brady Statistic RA Percent Paced: 1 %
Brady Statistic RV Percent Paced: 100 %
Date Time Interrogation Session: 20250421045400
HighPow Impedance: 84 Ohm
Implantable Lead Connection Status: 753985
Implantable Lead Connection Status: 753985
Implantable Lead Connection Status: 753985
Implantable Lead Location: 753858
Implantable Lead Location: 753859
Implantable Lead Location: 753860
Implantable Lead Model: 4674
Implantable Lead Model: 5076
Implantable Lead Model: 692
Implantable Lead Serial Number: 501753
Implantable Lead Serial Number: 503110
Lead Channel Impedance Value: 1296 Ohm
Lead Channel Impedance Value: 389 Ohm
Lead Channel Impedance Value: 776 Ohm
Lead Channel Pacing Threshold Amplitude: 0.7 V
Lead Channel Pacing Threshold Amplitude: 1.7 V
Lead Channel Pacing Threshold Amplitude: 2.8 V
Lead Channel Pacing Threshold Pulse Width: 0.5 ms
Lead Channel Pacing Threshold Pulse Width: 0.5 ms
Lead Channel Pacing Threshold Pulse Width: 0.5 ms
Lead Channel Setting Pacing Amplitude: 2 V
Lead Channel Setting Pacing Amplitude: 2 V
Lead Channel Setting Pacing Amplitude: 3.5 V
Lead Channel Setting Pacing Pulse Width: 0.5 ms
Lead Channel Setting Pacing Pulse Width: 0.5 ms
Lead Channel Setting Sensing Sensitivity: 0.6 mV
Lead Channel Setting Sensing Sensitivity: 1 mV
Pulse Gen Serial Number: 133503

## 2023-08-28 ENCOUNTER — Other Ambulatory Visit: Payer: Self-pay | Admitting: Internal Medicine

## 2023-08-28 DIAGNOSIS — E1122 Type 2 diabetes mellitus with diabetic chronic kidney disease: Secondary | ICD-10-CM

## 2023-09-04 ENCOUNTER — Encounter: Payer: Self-pay | Admitting: Pharmacist

## 2023-09-04 NOTE — Addendum Note (Signed)
 Addended by: Lott Rouleau A on: 09/04/2023 10:59 AM   Modules accepted: Orders

## 2023-09-04 NOTE — Progress Notes (Signed)
 Remote ICD transmission.

## 2023-09-04 NOTE — Progress Notes (Signed)
   09/04/2023  Patient ID: Yvonne Miller, female   DOB: 1950-06-30, 73 y.o.   MRN: [id number]  Pharmacy Quality Measure Review  This patient is appearing on a report for being at risk of failing the adherence measure for diabetes medications this calendar year.  She has been approved to receive Farxiga  from [employer] Patient assistance through the end of the year.  Medication: Farxiga  10 mg Last fill date: 08/11/2023 for 30 day supply  Call Patient to see if she received Farxiga  from the PAP

## 2023-09-16 ENCOUNTER — Telehealth: Payer: Self-pay | Admitting: Pharmacist

## 2023-09-16 ENCOUNTER — Telehealth: Payer: Self-pay

## 2023-09-16 DIAGNOSIS — N183 Chronic kidney disease, stage 3 unspecified: Secondary | ICD-10-CM

## 2023-09-16 NOTE — Telephone Encounter (Signed)
 Faxed Change/ order form to [facility] [facility] for Ozempic  2mg .

## 2023-09-16 NOTE — Progress Notes (Signed)
   09/16/2023  Patient ID: Yvonne Miller, female   DOB: [birth year], 73 y.o.   MRN: [id number]  Patient was called regarding medication assistance with Ozempic . Her dose was confirmed  to Ozempic  2 mg weekly. The Program Refill/reorder/Change Request form was faxed to Nettie Barb, CPhT with the RX Medication Assistance Team.  Plan: Follow up on medication assistance in 1 week as the Patient said she was on her last injection.  Geronimo Krabbe, PharmD, BCACP Clinical Pharmacist [phone]

## 2023-09-17 DIAGNOSIS — H25811 Combined forms of age-related cataract, right eye: Secondary | ICD-10-CM | POA: Diagnosis not present

## 2023-09-17 DIAGNOSIS — H2511 Age-related nuclear cataract, right eye: Secondary | ICD-10-CM | POA: Diagnosis not present

## 2023-09-17 DIAGNOSIS — H401110 Primary open-angle glaucoma, right eye, stage unspecified: Secondary | ICD-10-CM | POA: Diagnosis not present

## 2023-09-17 DIAGNOSIS — H401112 Primary open-angle glaucoma, right eye, moderate stage: Secondary | ICD-10-CM | POA: Diagnosis not present

## 2023-09-18 DIAGNOSIS — H25042 Posterior subcapsular polar age-related cataract, left eye: Secondary | ICD-10-CM | POA: Diagnosis not present

## 2023-09-18 DIAGNOSIS — H2512 Age-related nuclear cataract, left eye: Secondary | ICD-10-CM | POA: Diagnosis not present

## 2023-09-18 DIAGNOSIS — H25012 Cortical age-related cataract, left eye: Secondary | ICD-10-CM | POA: Diagnosis not present

## 2023-09-21 ENCOUNTER — Telehealth: Payer: Self-pay | Admitting: Pharmacist

## 2023-09-21 ENCOUNTER — Telehealth: Payer: Self-pay | Admitting: Cardiovascular Disease

## 2023-09-21 DIAGNOSIS — E1122 Type 2 diabetes mellitus with diabetic chronic kidney disease: Secondary | ICD-10-CM

## 2023-09-21 NOTE — Progress Notes (Signed)
   09/21/2023  Patient ID: Yvonne Miller, female   DOB: 06-08-1950, 73 y.o.   MRN: [id number]  Received a message from Mark Sil with [employer] that the Patient was trying to reach me.  I called her back but unfortunately, she did not answer the phone. HIPAA compliant message was left for the Patient with a request for call back.   Geronimo Krabbe, PharmD, BCACP Clinical Pharmacist [phone]

## 2023-09-21 NOTE — Telephone Encounter (Addendum)
 Alert remote transmission: ERI reached on 5/16.  Sending to triage. Will continue IFU per protocol.    Called and spoke with the patient.  Advised her that her device has reached ERI. She is aware that a message will send a message to April, EP procedure scheduler, to reach out to her and get a date/ time set up for her ICD generator replacement.   The patient voices understanding and is agreeable.   Patient seen on 07/31/23 with Dr. Arlester Ladd and procedure discussed in detail at that time.

## 2023-09-22 NOTE — Telephone Encounter (Signed)
 Called patient, NA, left message on VM box to contact our office to schedule ICD gen-change.

## 2023-09-23 NOTE — Telephone Encounter (Signed)
 Called and spoke with patient. Patient is indecisive as to whether or not to go back to [facility] for gen change, since they placed the first one. She said that she is 90% sure but will call back to let us  know after June. She also stated that she cannot do anything until after June because she has two eye surgeries that will require 2 week recovery periods, back to back, so that is why she will call us  back sometime in July.

## 2023-09-24 ENCOUNTER — Ambulatory Visit (INDEPENDENT_AMBULATORY_CARE_PROVIDER_SITE_OTHER)

## 2023-09-24 DIAGNOSIS — I428 Other cardiomyopathies: Secondary | ICD-10-CM

## 2023-09-24 LAB — CUP PACEART REMOTE DEVICE CHECK
Brady Statistic RA Percent Paced: 2 %
Brady Statistic RV Percent Paced: 100 %
Date Time Interrogation Session: 20250522044100
HighPow Impedance: 82 Ohm
Implantable Lead Connection Status: 753985
Implantable Lead Connection Status: 753985
Implantable Lead Connection Status: 753985
Implantable Lead Location: 753858
Implantable Lead Location: 753859
Implantable Lead Location: 753860
Implantable Lead Model: 4674
Implantable Lead Model: 5076
Implantable Lead Model: 692
Implantable Lead Serial Number: 501753
Implantable Lead Serial Number: 503110
Lead Channel Impedance Value: 1356 Ohm
Lead Channel Impedance Value: 378 Ohm
Lead Channel Impedance Value: 858 Ohm
Lead Channel Pacing Threshold Amplitude: 0.7 V
Lead Channel Pacing Threshold Amplitude: 1.7 V
Lead Channel Pacing Threshold Amplitude: 2.8 V
Lead Channel Pacing Threshold Pulse Width: 0.5 ms
Lead Channel Pacing Threshold Pulse Width: 0.5 ms
Lead Channel Pacing Threshold Pulse Width: 0.5 ms
Lead Channel Setting Pacing Amplitude: 2 V
Lead Channel Setting Pacing Amplitude: 2 V
Lead Channel Setting Pacing Amplitude: 3.5 V
Lead Channel Setting Pacing Pulse Width: 0.5 ms
Lead Channel Setting Pacing Pulse Width: 0.5 ms
Lead Channel Setting Sensing Sensitivity: 0.6 mV
Lead Channel Setting Sensing Sensitivity: 1 mV
Pulse Gen Serial Number: 133503

## 2023-10-07 ENCOUNTER — Telehealth: Payer: Self-pay | Admitting: Pharmacist

## 2023-10-07 DIAGNOSIS — Z79899 Other long term (current) drug therapy: Secondary | ICD-10-CM

## 2023-10-07 NOTE — Progress Notes (Signed)
 Pharmacy Quality Measure Review  This patient is appearing on a report for being at risk of failing the adherence measure for diabetes medications this calendar year.   Medication: Farxiga  10 mg  Last fill date:04/30/25or 30 day supply  Spoke with Patient. She received her shipment of Farxiga  from [employer]. Will most likely fail the measure.   Patient also inquired about her [employer] Application.  Unfortunately, she was denied by [facility] [employer] because  she has insurance. She was encouraged to send them a hardship letter as she said the copay is cost prohibitive.  Patient was sent their contact information via Mychart and via telephone:  Call:  [phone]  8 AM TO 5 PM ET     Mail:  [facility] PATIENT ASSISTANCE PROGRAM  [id number]  [place], [place]  [address]     Fax:  [phone]   Next appt 12/01/23-follow up after her upcoming appt  Geronimo Krabbe, PharmD, [facility] Clinical Pharmacist [phone]

## 2023-10-08 DIAGNOSIS — H401121 Primary open-angle glaucoma, left eye, mild stage: Secondary | ICD-10-CM | POA: Diagnosis not present

## 2023-10-08 DIAGNOSIS — H401122 Primary open-angle glaucoma, left eye, moderate stage: Secondary | ICD-10-CM | POA: Diagnosis not present

## 2023-10-08 DIAGNOSIS — H25812 Combined forms of age-related cataract, left eye: Secondary | ICD-10-CM | POA: Diagnosis not present

## 2023-10-08 DIAGNOSIS — H2512 Age-related nuclear cataract, left eye: Secondary | ICD-10-CM | POA: Diagnosis not present

## 2023-10-09 NOTE — Addendum Note (Signed)
 Addended by: Edra Govern D on: 10/09/2023 09:12 AM   Modules accepted: Orders, Level of Service

## 2023-10-09 NOTE — Progress Notes (Signed)
 Remote ICD transmission.

## 2023-10-15 ENCOUNTER — Other Ambulatory Visit: Payer: Self-pay | Admitting: Cardiology

## 2023-10-22 ENCOUNTER — Other Ambulatory Visit: Payer: Self-pay | Admitting: Internal Medicine

## 2023-10-22 DIAGNOSIS — K219 Gastro-esophageal reflux disease without esophagitis: Secondary | ICD-10-CM

## 2023-10-26 ENCOUNTER — Ambulatory Visit

## 2023-10-26 DIAGNOSIS — I428 Other cardiomyopathies: Secondary | ICD-10-CM | POA: Diagnosis not present

## 2023-10-26 DIAGNOSIS — I5022 Chronic systolic (congestive) heart failure: Secondary | ICD-10-CM

## 2023-10-27 DIAGNOSIS — I429 Cardiomyopathy, unspecified: Secondary | ICD-10-CM | POA: Diagnosis not present

## 2023-10-27 DIAGNOSIS — I501 Left ventricular failure: Secondary | ICD-10-CM | POA: Diagnosis not present

## 2023-10-27 DIAGNOSIS — I447 Left bundle-branch block, unspecified: Secondary | ICD-10-CM | POA: Diagnosis not present

## 2023-10-27 DIAGNOSIS — I10 Essential (primary) hypertension: Secondary | ICD-10-CM | POA: Diagnosis not present

## 2023-10-27 DIAGNOSIS — I5032 Chronic diastolic (congestive) heart failure: Secondary | ICD-10-CM | POA: Diagnosis not present

## 2023-10-27 DIAGNOSIS — I428 Other cardiomyopathies: Secondary | ICD-10-CM | POA: Diagnosis not present

## 2023-10-27 DIAGNOSIS — Z4502 Encounter for adjustment and management of automatic implantable cardiac defibrillator: Secondary | ICD-10-CM | POA: Diagnosis not present

## 2023-10-27 LAB — CUP PACEART REMOTE DEVICE CHECK
Brady Statistic RA Percent Paced: 1 %
Brady Statistic RV Percent Paced: 100 %
Date Time Interrogation Session: 20250624001200
HighPow Impedance: 87 Ohm
Implantable Lead Connection Status: 753985
Implantable Lead Connection Status: 753985
Implantable Lead Connection Status: 753985
Implantable Lead Location: 753858
Implantable Lead Location: 753859
Implantable Lead Location: 753860
Implantable Lead Model: 4674
Implantable Lead Model: 5076
Implantable Lead Model: 692
Implantable Lead Serial Number: 501753
Implantable Lead Serial Number: 503110
Lead Channel Impedance Value: 1306 Ohm
Lead Channel Impedance Value: 382 Ohm
Lead Channel Impedance Value: 870 Ohm
Lead Channel Pacing Threshold Amplitude: 0.7 V
Lead Channel Pacing Threshold Amplitude: 1.7 V
Lead Channel Pacing Threshold Amplitude: 2.8 V
Lead Channel Pacing Threshold Pulse Width: 0.5 ms
Lead Channel Pacing Threshold Pulse Width: 0.5 ms
Lead Channel Pacing Threshold Pulse Width: 0.5 ms
Lead Channel Setting Pacing Amplitude: 2 V
Lead Channel Setting Pacing Amplitude: 2 V
Lead Channel Setting Pacing Amplitude: 3.5 V
Lead Channel Setting Pacing Pulse Width: 0.5 ms
Lead Channel Setting Pacing Pulse Width: 0.5 ms
Lead Channel Setting Sensing Sensitivity: 0.6 mV
Lead Channel Setting Sensing Sensitivity: 1 mV
Pulse Gen Serial Number: 133503

## 2023-11-03 NOTE — Progress Notes (Signed)
 Remote ICD transmission.

## 2023-11-04 ENCOUNTER — Ambulatory Visit: Payer: Self-pay | Admitting: Cardiovascular Disease

## 2023-11-19 DIAGNOSIS — Z01818 Encounter for other preprocedural examination: Secondary | ICD-10-CM | POA: Diagnosis not present

## 2023-11-19 DIAGNOSIS — G4733 Obstructive sleep apnea (adult) (pediatric): Secondary | ICD-10-CM | POA: Diagnosis not present

## 2023-11-19 DIAGNOSIS — N183 Chronic kidney disease, stage 3 unspecified: Secondary | ICD-10-CM | POA: Diagnosis not present

## 2023-11-19 DIAGNOSIS — Z794 Long term (current) use of insulin: Secondary | ICD-10-CM | POA: Diagnosis not present

## 2023-11-19 DIAGNOSIS — E1122 Type 2 diabetes mellitus with diabetic chronic kidney disease: Secondary | ICD-10-CM | POA: Diagnosis not present

## 2023-11-19 DIAGNOSIS — J849 Interstitial pulmonary disease, unspecified: Secondary | ICD-10-CM | POA: Diagnosis not present

## 2023-11-19 DIAGNOSIS — I428 Other cardiomyopathies: Secondary | ICD-10-CM | POA: Diagnosis not present

## 2023-11-19 DIAGNOSIS — I132 Hypertensive heart and chronic kidney disease with heart failure and with stage 5 chronic kidney disease, or end stage renal disease: Secondary | ICD-10-CM | POA: Diagnosis not present

## 2023-11-19 DIAGNOSIS — Z95810 Presence of automatic (implantable) cardiac defibrillator: Secondary | ICD-10-CM | POA: Diagnosis not present

## 2023-11-19 DIAGNOSIS — I5022 Chronic systolic (congestive) heart failure: Secondary | ICD-10-CM | POA: Diagnosis not present

## 2023-11-19 DIAGNOSIS — Z789 Other specified health status: Secondary | ICD-10-CM | POA: Diagnosis not present

## 2023-11-26 ENCOUNTER — Encounter

## 2023-11-26 DIAGNOSIS — I5022 Chronic systolic (congestive) heart failure: Secondary | ICD-10-CM | POA: Diagnosis not present

## 2023-11-26 DIAGNOSIS — I447 Left bundle-branch block, unspecified: Secondary | ICD-10-CM | POA: Diagnosis not present

## 2023-11-26 DIAGNOSIS — R9431 Abnormal electrocardiogram [ECG] [EKG]: Secondary | ICD-10-CM | POA: Diagnosis not present

## 2023-11-26 DIAGNOSIS — I428 Other cardiomyopathies: Secondary | ICD-10-CM | POA: Diagnosis not present

## 2023-11-26 DIAGNOSIS — Z79899 Other long term (current) drug therapy: Secondary | ICD-10-CM | POA: Diagnosis not present

## 2023-11-26 DIAGNOSIS — Z4502 Encounter for adjustment and management of automatic implantable cardiac defibrillator: Secondary | ICD-10-CM | POA: Diagnosis not present

## 2023-11-26 DIAGNOSIS — I130 Hypertensive heart and chronic kidney disease with heart failure and stage 1 through stage 4 chronic kidney disease, or unspecified chronic kidney disease: Secondary | ICD-10-CM | POA: Diagnosis not present

## 2023-11-26 DIAGNOSIS — I420 Dilated cardiomyopathy: Secondary | ICD-10-CM | POA: Diagnosis not present

## 2023-11-26 DIAGNOSIS — E1122 Type 2 diabetes mellitus with diabetic chronic kidney disease: Secondary | ICD-10-CM | POA: Diagnosis not present

## 2023-11-26 DIAGNOSIS — I498 Other specified cardiac arrhythmias: Secondary | ICD-10-CM | POA: Diagnosis not present

## 2023-11-26 DIAGNOSIS — Z95810 Presence of automatic (implantable) cardiac defibrillator: Secondary | ICD-10-CM | POA: Diagnosis not present

## 2023-11-26 DIAGNOSIS — N183 Chronic kidney disease, stage 3 unspecified: Secondary | ICD-10-CM | POA: Diagnosis not present

## 2023-11-27 ENCOUNTER — Telehealth: Payer: Self-pay | Admitting: Cardiovascular Disease

## 2023-11-27 ENCOUNTER — Encounter: Payer: Self-pay | Admitting: Cardiovascular Disease

## 2023-11-27 NOTE — Addendum Note (Signed)
 Addended by: TAWNI DRILLING D on: 11/27/2023 03:51 PM   Modules accepted: Orders

## 2023-11-27 NOTE — Telephone Encounter (Signed)
 Patient states she had surgery yesterday for her ICD Gen Change at [facility]. She states she received a text stating the transmission was missed. She called [employer] for assistance, which she states she was told our Device Clinic needs to enter her new serial number to be able to receive the transmission. Serial number is: [id number]. She states although she had the ICD Gen Change at [facility], she still plans to continue to follow [facility] for care.

## 2023-11-27 NOTE — Progress Notes (Signed)
 Remote ICD transmission.

## 2023-12-01 ENCOUNTER — Ambulatory Visit: Admitting: Internal Medicine

## 2023-12-02 NOTE — Telephone Encounter (Signed)
 I have updated the pt device information.

## 2023-12-09 ENCOUNTER — Encounter: Payer: Self-pay | Admitting: Pharmacist

## 2023-12-09 NOTE — Telephone Encounter (Signed)
 Contacted patient to send a remote transmission to re-establish communication between new device and [employer].   Communication successful and patient is back on routine remote schedule with our clinic.

## 2023-12-09 NOTE — Progress Notes (Signed)
 error

## 2023-12-15 NOTE — Patient Instructions (Signed)
 Hypertension, Adult Hypertension is another name for high blood pressure. High blood pressure forces your heart to work harder to pump blood. This can cause problems over time. There are two numbers in a blood pressure reading. There is a top number (systolic) over a bottom number (diastolic). It is best to have a blood pressure that is below 120/80. What are the causes? The cause of this condition is not known. Some other conditions can lead to high blood pressure. What increases the risk? Some lifestyle factors can make you more likely to develop high blood pressure: Smoking. Not getting enough exercise or physical activity. Being overweight. Having too much fat, sugar, calories, or salt (sodium) in your diet. Drinking too much alcohol . Other risk factors include: Having any of these conditions: Heart disease. Diabetes. High cholesterol. Kidney disease. Obstructive sleep apnea. Having a family history of high blood pressure and high cholesterol. Age. The risk increases with age. Stress. What are the signs or symptoms? High blood pressure may not cause symptoms. Very high blood pressure (hypertensive crisis) may cause: Headache. Fast or uneven heartbeats (palpitations). Shortness of breath. Nosebleed. Vomiting or feeling like you may vomit (nauseous). Changes in how you see. Very bad chest pain. Feeling dizzy. Seizures. How is this treated? This condition is treated by making healthy lifestyle changes, such as: Eating healthy foods. Exercising more. Drinking less alcohol . Your doctor may prescribe medicine if lifestyle changes do not help enough and if: Your top number is above 130. Your bottom number is above 80. Your personal target blood pressure may vary. Follow these instructions at home: Eating and drinking  If told, follow the DASH eating plan. To follow this plan: Fill one half of your plate at each meal with fruits and vegetables. Fill one fourth of your plate  at each meal with whole grains. Whole grains include whole-wheat pasta, brown rice, and whole-grain bread. Eat or drink low-fat dairy products, such as skim milk or low-fat yogurt. Fill one fourth of your plate at each meal with low-fat (lean) proteins. Low-fat proteins include fish, chicken without skin, eggs, beans, and tofu. Avoid fatty meat, cured and processed meat, or chicken with skin. Avoid pre-made or processed food. Limit the amount of salt in your diet to less than 1,500 mg each day. Do not drink alcohol  if: Your doctor tells you not to drink. You are pregnant, may be pregnant, or are planning to become pregnant. If you drink alcohol : Limit how much you have to: 0-1 drink a day for women. 0-2 drinks a day for men. Know how much alcohol  is in your drink. In the [place] one drink equals one 12 oz bottle of beer (355 mL), one 5 oz glass of wine (148 mL), or one 1 oz glass of hard liquor (44 mL). Lifestyle  Work with your doctor to stay at a healthy weight or to lose weight. Ask your doctor what the best weight is for you. Get at least 30 minutes of exercise that causes your heart to beat faster (aerobic exercise) most days of the week. This may include walking, swimming, or biking. Get at least 30 minutes of exercise that strengthens your muscles (resistance exercise) at least 3 days a week. This may include lifting weights or doing Pilates. Do not smoke or use any products that contain nicotine  or tobacco. If you need help quitting, ask your doctor. Check your blood pressure at home as told by your doctor. Keep all follow-up visits. Medicines Take over-the-counter and prescription medicines  only as told by your doctor. Follow directions carefully. Do not skip doses of blood pressure medicine. The medicine does not work as well if you skip doses. Skipping doses also puts you at risk for problems. Ask your doctor about side effects or reactions to medicines that you should watch  for. Contact a doctor if: You think you are having a reaction to the medicine you are taking. You have headaches that keep coming back. You feel dizzy. You have swelling in your ankles. You have trouble with your vision. Get help right away if: You get a very bad headache. You start to feel mixed up (confused). You feel weak or numb. You feel faint. You have very bad pain in your: Chest. Belly (abdomen). You vomit more than once. You have trouble breathing. These symptoms may be an emergency. Get help right away. Call 911. Do not wait to see if the symptoms will go away. Do not drive yourself to the hospital. Summary Hypertension is another name for high blood pressure. High blood pressure forces your heart to work harder to pump blood. For most people, a normal blood pressure is less than 120/80. Making healthy choices can help lower blood pressure. If your blood pressure does not get lower with healthy choices, you may need to take medicine. This information is not intended to replace advice given to you by your health care provider. Make sure you discuss any questions you have with your health care provider. Document Revised: 02/07/2021 Document Reviewed: 02/07/2021 Elsevier Patient Education  2024 [employer].

## 2023-12-15 NOTE — Progress Notes (Signed)
 I,[name], CMA,acting as a Neurosurgeon for [name], MD.,have documented all relevant documentation on the behalf of [name], MD,as directed by  [name], MD while in the presence of [name], MD.  Subjective:  Patient ID: Yvonne Miller , female    DOB: 01/30/1951 , 73 y.o.   MRN: [id number]  Chief Complaint  Patient presents with   Hypertension    Patient presents today for bp & dm follow up. She reports compliance with medications. Denies headache, chest pain & sob.  Today marks 3 weeks since pace maker procedure. She reports feeling okay.    Diabetes    HPI Discussed the use of AI scribe software for clinical note transcription with the patient, who gave verbal consent to proceed.  History of Present Illness Yvonne Miller is a 73 year old female with diabetes and hypertension who presents for a diabetes and blood pressure check.  Her blood sugar levels have been consistently between 120 to 127 mg/dL, with morning levels ranging from 100 to 115 mg/dL. Postprandial levels rarely exceed 187 to 189 mg/dL. She uses a Libre device for monitoring, although she notes discrepancies between the device and regular blood tests. Her last A1c was 6.3%.  She recently had her pacemaker battery changed three weeks ago at [facility]. She has been on Ozempic , previously taking two milligrams, but recently received a shipment with one milligram doses. She has been on this regimen for the last six weeks, taking one milligram twice weekly until her next shipment.  Her current medications include Carvedilol  1.5 mg twice daily, Farxiga  10 mg daily, Hydralazine  50 mg twice daily, Humalog  75/25 at 12 units twice daily, Pravastatin  40 mg daily at night, and Entresto  twice daily. She reports no issues with Farxiga  and has reduced her Humalog  dose significantly from 60 units at night last year to 12 units twice daily now.  No current bladder issues. Her last echocardiogram was in March.  She recalls that her heart function has improved compared to ten years ago.   Diabetes She presents for her follow-up diabetic visit. She has type 2 diabetes mellitus. Her disease course has been stable. There are no hypoglycemic associated symptoms. Pertinent negatives for diabetes include no blurred vision and no chest pain. There are no hypoglycemic complications. Diabetic complications include nephropathy. Risk factors for coronary artery disease include diabetes mellitus, dyslipidemia, hypertension, obesity and sedentary lifestyle. Current diabetic treatment includes insulin  injections. She is compliant with treatment some of the time. She is following a generally healthy diet. She participates in exercise intermittently. Her breakfast blood glucose is taken between 8-9 am. Her breakfast blood glucose range is generally 110-130 mg/dl. An ACE inhibitor/angiotensin II receptor blocker is being taken.  Hypertension This is a chronic problem. The current episode started more than 1 year ago. The problem has been gradually improving since onset. The problem is controlled. Pertinent negatives include no blurred vision, chest pain, palpitations or shortness of breath. Past treatments include ACE inhibitors, angiotensin blockers and diuretics. The current treatment provides moderate improvement. Compliance problems include exercise.  Hypertensive end-organ damage includes kidney disease and heart failure. Identifiable causes of hypertension include sleep apnea.     Past Medical History:  Diagnosis Date   AICD (automatic cardioverter/defibrillator) present    Arthritis    Asthma    Carpal tunnel syndrome, bilateral    CKD (chronic kidney disease)    Diabetes mellitus    Encounter for assessment of implantable  cardioverter-defibrillator (ICD)    GERD (gastroesophageal reflux disease)    Hyperlipemia    Hypertension    Obesity    morbid   Sleep apnea    wears CPAP set at 12   Wears glasses       Family History  Problem Relation Age of Onset   Hypertension Mother    Stroke Mother    Heart disease Father    Heart attack Sister    Heart attack Brother      Current Outpatient Medications:    acetaminophen  (TYLENOL ) 500 MG tablet, Take 500 mg by mouth every 6 (six) hours as needed for moderate pain., Disp: , Rfl:    albuterol  (PROVENTIL  HFA;VENTOLIN  HFA) 108 (90 Base) MCG/ACT inhaler, Inhale 2 puffs into the lungs every 4 (four) hours as needed for wheezing or shortness of breath., Disp: , Rfl:    carvedilol  (COREG ) 25 MG tablet, TAKE 1 AND 1/2 TABLETS BY MOUTH  TWICE DAILY, Disp: 300 tablet, Rfl: 2   Cholecalciferol  (VITAMIN D ) 50 MCG (2000 UT) tablet, Take 2,000 Units by mouth daily., Disp: , Rfl:    Continuous Glucose Sensor (FREESTYLE LIBRE 3 PLUS SENSOR) MISC, Change sensor every 15 days., Disp: 6 each, Rfl: 3   famotidine  (PEPCID ) 20 MG tablet, TAKE 1 TABLET BY MOUTH DAILY AS  NEEDED, Disp: 100 tablet, Rfl: 2   FARXIGA  10 MG TABS tablet, TAKE 1 TABLET BY MOUTH EVERY DAY, Disp: 30 tablet, Rfl: 2   fluticasone  (FLONASE ) 50 MCG/ACT nasal spray, Place 1 spray into both nostrils daily. (Patient taking differently: Place 1 spray into both nostrils as needed for allergies.), Disp: 16 g, Rfl: 2   furosemide  (LASIX ) 40 MG tablet, TAKE 1 TABLET BY MOUTH EVERY DAY IN THE MORNING, Disp: 90 tablet, Rfl: 3   gatifloxacin (ZYMAXID) 0.5 % SOLN, Place 1 drop into the right eye 4 (four) times daily., Disp: , Rfl:    hydrALAZINE  (APRESOLINE ) 50 MG tablet, TAKE 1 TABLET BY MOUTH TWICE  DAILY, Disp: 200 tablet, Rfl: 2   hydrocortisone 2.5 % cream, Apply 1 Application topically every other day., Disp: , Rfl:    hydroquinone 4 % cream, Apply topically once a week., Disp: , Rfl:    insulin  lispro protamine-lispro (HUMALOG  75/25 MIX) (75-25) 100 UNIT/ML SUSP injection, Inject 10 Units into the skin 2 (two) times daily with a meal., Disp: , Rfl:    ketoconazole (NIZORAL) 2 % shampoo, Apply 1 Application  topically 2 (two) times a week., Disp: , Rfl:    nystatin  (NYSTATIN ) powder, Apply topically 3 (three) times daily as needed. As needed, Disp: 60 g, Rfl: 2   omeprazole  (PRILOSEC) 40 MG capsule, TAKE 1 CAPSULE BY MOUTH DAILY  BEFORE BREAKFAST, Disp: 100 capsule, Rfl: 2   Probiotic Product (PROBIOTIC PO), Take 1 capsule by mouth at bedtime., Disp: , Rfl:    pyridOXINE (VITAMIN B6) 25 MG tablet, Take 25 mg by mouth daily., Disp: , Rfl:    sacubitril-valsartan  (ENTRESTO ) 49-51 MG, Take 1 tablet by mouth 2 (two) times daily., Disp: 180 tablet, Rfl: 3   Semaglutide , 2 MG/DOSE, 8 MG/3ML SOPN, Inject 2 mg as directed once a week., Disp: 3 mL, Rfl: 2   vitamin B-12 (CYANOCOBALAMIN) 100 MCG tablet, Take 100 mcg by mouth daily., Disp: , Rfl:    VYZULTA 0.024 % SOLN, Apply 1 drop to eye every evening., Disp: , Rfl:    pravastatin  (PRAVACHOL ) 40 MG tablet, Take 1 tablet (40 mg total) by mouth daily.,  Disp: 100 tablet, Rfl: 2   Allergies  Allergen Reactions   Other Other (See Comments)    NO BLOOD   . Jehovah witness     Review of Systems  Constitutional: Negative.   Eyes:  Negative for blurred vision.  Respiratory: Negative.  Negative for shortness of breath.   Cardiovascular: Negative.  Negative for chest pain and palpitations.  Gastrointestinal: Negative.   Neurological: Negative.   Psychiatric/Behavioral: Negative.       Today's Vitals   12/16/23 0832  BP: 124/70  Pulse: 76  Temp: 98 F (36.7 C)  SpO2: 98%  Weight: 232 lb (105.2 kg)  Height: 5' 4 (1.626 m)   Body mass index is 39.82 kg/m.  Wt Readings from Last 3 Encounters:  12/16/23 232 lb (105.2 kg)  07/31/23 235 lb (106.6 kg)  07/07/23 235 lb 12.8 oz (107 kg)     Objective:  Physical Exam Vitals and nursing note reviewed.  Constitutional:      Appearance: Normal appearance. She is obese.  HENT:     Head: Normocephalic and atraumatic.  Eyes:     Extraocular Movements: Extraocular movements intact.  Cardiovascular:      Rate and Rhythm: Normal rate and regular rhythm.     Heart sounds: Normal heart sounds.  Pulmonary:     Effort: Pulmonary effort is normal.     Breath sounds: Normal breath sounds.  Musculoskeletal:     Cervical back: Normal range of motion.  Skin:    General: Skin is warm.  Neurological:     General: No focal deficit present.     Mental Status: She is alert.  Psychiatric:        Mood and Affect: Mood normal.        Behavior: Behavior normal.       Assessment And Plan:  Hypertensive heart and renal disease with renal failure, stage 1 through stage 4 or unspecified chronic kidney disease, with heart failure (HCC) Assessment & Plan: Chronic, well controlled.  She will continue with carvedilol  25mg  1-1/2 tabs twice daily, furosemide  40mg  and hydralazine  50mg  twice daily. Encouraged to follow low sodium diet. Reminded to avoid NSAIDs to decrease risk of CKD progression. Please see below regarding HF.    Chronic diastolic heart failure (HCC) Assessment & Plan: Chronic, encouraged to follow low sodium diet.  Heart function well-managed with normal pumping function. Recent echocardiogram satisfactory. - Continue Entresto  as prescribed. - Continue with carvedilol  and furosemide .    Type 2 diabetes mellitus with stage 3b chronic kidney disease, with long-term current use of insulin  (HCC) Assessment & Plan: Blood glucose well-controlled. Hemoglobin A1c at 6.3%. Using [place] for monitoring. Dosage issue with Ozempic  due to shipment error. - Continue Ozempic  1 mg twice weekly until 2 mg is available. - Ensure adequate supply of needles for Ozempic  administration. - Monitor blood glucose levels regularly using Libre. - Follow up with pharmacy to resolve Ozempic  dosage issue.  Orders: -     Microalbumin / creatinine urine ratio -     CMP14+EGFR -     Hemoglobin A1c  Class 2 severe obesity due to excess calories with serious comorbidity and body mass index (BMI) of 39.0 to 39.9 in adult  [facility]) Assessment & Plan: She was congratulated on her continued weight loss. BMI is now under 40. Encouraged to aim for at least 150 minutes of exercise per week.     Return in 4 months (on 04/16/2024), or dm check, for  schedule physical in  April 2026.  Patient was given opportunity to ask questions. Patient verbalized understanding of the plan and was able to repeat key elements of the plan. All questions were answered to their satisfaction.   I, [name], MD, have reviewed all documentation for this visit. The documentation on 12/16/23 for the exam, diagnosis, procedures, and orders are all accurate and complete.   IF YOU HAVE BEEN REFERRED TO A SPECIALIST, IT MAY TAKE 1-2 WEEKS TO SCHEDULE/PROCESS THE REFERRAL. IF YOU HAVE NOT HEARD FROM US /SPECIALIST IN TWO WEEKS, PLEASE GIVE US  A CALL AT [phone].   THE PATIENT IS ENCOURAGED TO PRACTICE SOCIAL DISTANCING DUE TO THE COVID-19 PANDEMIC.

## 2023-12-16 ENCOUNTER — Telehealth: Payer: Self-pay

## 2023-12-16 ENCOUNTER — Telehealth: Payer: Self-pay | Admitting: Pharmacist

## 2023-12-16 ENCOUNTER — Encounter: Payer: Self-pay | Admitting: Internal Medicine

## 2023-12-16 ENCOUNTER — Ambulatory Visit: Admitting: Internal Medicine

## 2023-12-16 VITALS — BP 124/70 | HR 76 | Temp 98.0°F | Ht 64.0 in | Wt 232.0 lb

## 2023-12-16 DIAGNOSIS — I428 Other cardiomyopathies: Secondary | ICD-10-CM

## 2023-12-16 DIAGNOSIS — R300 Dysuria: Secondary | ICD-10-CM

## 2023-12-16 DIAGNOSIS — E7800 Pure hypercholesterolemia, unspecified: Secondary | ICD-10-CM

## 2023-12-16 DIAGNOSIS — Z6839 Body mass index (BMI) 39.0-39.9, adult: Secondary | ICD-10-CM

## 2023-12-16 DIAGNOSIS — Z794 Long term (current) use of insulin: Secondary | ICD-10-CM | POA: Diagnosis not present

## 2023-12-16 DIAGNOSIS — I5032 Chronic diastolic (congestive) heart failure: Secondary | ICD-10-CM | POA: Diagnosis not present

## 2023-12-16 DIAGNOSIS — N1832 Chronic kidney disease, stage 3b: Secondary | ICD-10-CM

## 2023-12-16 DIAGNOSIS — I130 Hypertensive heart and chronic kidney disease with heart failure and stage 1 through stage 4 chronic kidney disease, or unspecified chronic kidney disease: Secondary | ICD-10-CM

## 2023-12-16 DIAGNOSIS — E1122 Type 2 diabetes mellitus with diabetic chronic kidney disease: Secondary | ICD-10-CM | POA: Diagnosis not present

## 2023-12-16 DIAGNOSIS — E66812 Obesity, class 2: Secondary | ICD-10-CM

## 2023-12-16 MED ORDER — PRAVASTATIN SODIUM 40 MG PO TABS: 40.0000 mg | ORAL_TABLET | Freq: Every day | ORAL | 2 refills | Status: AC

## 2023-12-16 NOTE — Telephone Encounter (Signed)
 Completed Re-order/refill form for Ozempic  ([employer]) faxed to [employer] office for signature.

## 2023-12-16 NOTE — Assessment & Plan Note (Addendum)
 Her EF has improved, most recent echo reviewed in detail. Her last echo was March 2025, nl EF 60-65% with grade 1 diastolic dysfunction.  Reminded of importance of medication/dietary compliance. She will continue with regular f/u with Cardiology.  - Follow low sodium diet - Continue with Entresto , Farxiga 

## 2023-12-16 NOTE — Progress Notes (Signed)
   12/16/2023  Patient ID: Yvonne Miller, female   DOB: 1950-10-19, 73 y.o.   MRN: [id number]  Patient was called to discuss Ozempic . She saw Dr. Jarold today and expressed concern about Ozempic .  She wondered about her dose as she had been sent Ozempic  1 mg weekly from [employer].  The company was called. The [employer] Representative said Ozempic  1 mg was sent 10/21/23 and that they received the refill/change form for Ozempic  2 mg earlier in May and sent a shipment of Ozempic  2 mg that was signed for on 09/30/23.  A refill/order form will need to be completed for the Patient to receive another shipment of Ozempic  2mg .  The form will be completed and faxed back to Luke Mall, CPhT for submission to the company.  Patient's blood sugars were reviewed via the [employer] portal:    GMI 6.3% Her last HgA1c was 7.1% labs were drawn today. On Farxiga  10 mg 1 tablet daily Humalog  Mix 75/25 10 units twice daily  Pravastatin  40- not filled since last year. Patient said she had a lot left over and had not needed it. She said she was down to her last 10 tablets. Refill sent to [facility] required  Once picked up, [employer] gap will closed.  Plan: Follow up on labs in a week to see if insulin  dose needs to be decreased. Patient had two lows in the past few weeks.  Cassius DOROTHA Brought, PharmD, BCACP Clinical Pharmacist [phone]

## 2023-12-16 NOTE — Assessment & Plan Note (Signed)
 Chronic, well controlled.  She will continue with carvedilol  25mg  1-1/2 tabs twice daily, furosemide  40mg  and hydralazine  50mg  twice daily. Encouraged to follow low sodium diet. Reminded to avoid NSAIDs to decrease risk of CKD progression. Please see below regarding HF.

## 2023-12-18 ENCOUNTER — Ambulatory Visit: Payer: Self-pay | Admitting: Internal Medicine

## 2023-12-18 LAB — CMP14+EGFR
ALT: 13 IU/L (ref 0–32)
AST: 19 IU/L (ref 0–40)
Albumin: 4.1 g/dL (ref 3.8–4.8)
Alkaline Phosphatase: 108 IU/L (ref 44–121)
BUN/Creatinine Ratio: 19 (ref 12–28)
BUN: 28 mg/dL — ABNORMAL HIGH (ref 8–27)
Bilirubin Total: 0.2 mg/dL (ref 0.0–1.2)
CO2: 23 mmol/L (ref 20–29)
Calcium: 10 mg/dL (ref 8.7–10.3)
Chloride: 103 mmol/L (ref 96–106)
Creatinine, Ser: 1.49 mg/dL — ABNORMAL HIGH (ref 0.57–1.00)
Globulin, Total: 3.2 g/dL (ref 1.5–4.5)
Glucose: 136 mg/dL — ABNORMAL HIGH (ref 70–99)
Potassium: 4.7 mmol/L (ref 3.5–5.2)
Sodium: 142 mmol/L (ref 134–144)
Total Protein: 7.3 g/dL (ref 6.0–8.5)
eGFR: 37 mL/min/1.73 — ABNORMAL LOW (ref >59)

## 2023-12-18 LAB — MICROALBUMIN / CREATININE URINE RATIO
Creatinine, Urine: 152.3 mg/dL
Microalb/Creat Ratio: 20 mg/g{creat} (ref 0–29)
Microalbumin, Urine: 29.7 ug/mL

## 2023-12-18 LAB — HEMOGLOBIN A1C
Est. average glucose Bld gHb Est-mCnc: 140 mg/dL
Hgb A1c MFr Bld: 6.5 % — ABNORMAL HIGH (ref 4.8–5.6)

## 2023-12-20 NOTE — Assessment & Plan Note (Signed)
 She was congratulated on her continued weight loss. BMI is now under 40. Encouraged to aim for at least 150 minutes of exercise per week.

## 2023-12-20 NOTE — Assessment & Plan Note (Signed)
 Blood glucose well-controlled. Hemoglobin A1c at 6.3%. Using [place] for monitoring. Dosage issue with Ozempic  due to shipment error. - Continue Ozempic  1 mg twice weekly until 2 mg is available. - Ensure adequate supply of needles for Ozempic  administration. - Monitor blood glucose levels regularly using Libre. - Follow up with pharmacy to resolve Ozempic  dosage issue.

## 2023-12-26 NOTE — Assessment & Plan Note (Addendum)
 Chronic, encouraged to follow low sodium diet.  Heart function well-managed with normal pumping function. Recent echocardiogram satisfactory. - Continue Entresto  as prescribed. - Continue with carvedilol  and furosemide .

## 2023-12-28 ENCOUNTER — Encounter

## 2023-12-28 DIAGNOSIS — N390 Urinary tract infection, site not specified: Secondary | ICD-10-CM | POA: Diagnosis not present

## 2023-12-28 DIAGNOSIS — E1122 Type 2 diabetes mellitus with diabetic chronic kidney disease: Secondary | ICD-10-CM | POA: Diagnosis not present

## 2023-12-28 DIAGNOSIS — I129 Hypertensive chronic kidney disease with stage 1 through stage 4 chronic kidney disease, or unspecified chronic kidney disease: Secondary | ICD-10-CM | POA: Diagnosis not present

## 2023-12-28 DIAGNOSIS — I509 Heart failure, unspecified: Secondary | ICD-10-CM | POA: Diagnosis not present

## 2023-12-28 DIAGNOSIS — N1832 Chronic kidney disease, stage 3b: Secondary | ICD-10-CM | POA: Diagnosis not present

## 2024-01-07 DIAGNOSIS — Z1231 Encounter for screening mammogram for malignant neoplasm of breast: Secondary | ICD-10-CM | POA: Diagnosis not present

## 2024-01-07 LAB — HM MAMMOGRAPHY

## 2024-01-11 ENCOUNTER — Encounter: Payer: Self-pay | Admitting: Internal Medicine

## 2024-01-11 ENCOUNTER — Other Ambulatory Visit: Payer: Self-pay | Admitting: Cardiology

## 2024-01-11 DIAGNOSIS — I428 Other cardiomyopathies: Secondary | ICD-10-CM

## 2024-01-11 DIAGNOSIS — I5022 Chronic systolic (congestive) heart failure: Secondary | ICD-10-CM

## 2024-01-11 DIAGNOSIS — I5042 Chronic combined systolic (congestive) and diastolic (congestive) heart failure: Secondary | ICD-10-CM

## 2024-02-09 ENCOUNTER — Ambulatory Visit
Admission: RE | Admit: 2024-02-09 | Discharge: 2024-02-09 | Disposition: A | Source: Ambulatory Visit | Attending: Internal Medicine | Admitting: Internal Medicine

## 2024-02-09 DIAGNOSIS — J849 Interstitial pulmonary disease, unspecified: Secondary | ICD-10-CM | POA: Diagnosis not present

## 2024-02-09 DIAGNOSIS — R7689 Other specified abnormal immunological findings in serum: Secondary | ICD-10-CM

## 2024-02-09 DIAGNOSIS — J8410 Pulmonary fibrosis, unspecified: Secondary | ICD-10-CM | POA: Diagnosis not present

## 2024-02-09 DIAGNOSIS — R9389 Abnormal findings on diagnostic imaging of other specified body structures: Secondary | ICD-10-CM

## 2024-02-10 ENCOUNTER — Telehealth: Payer: Self-pay

## 2024-02-10 NOTE — Telephone Encounter (Signed)
 PAP: Patient assistance application for Farxiga  through [employer] (AZ&Me) has been mailed to pt's home address on file. Provider portion of application will be faxed to provider's office.For renewal 2026.

## 2024-02-14 ENCOUNTER — Ambulatory Visit: Payer: Self-pay | Admitting: Internal Medicine

## 2024-02-14 NOTE — Progress Notes (Signed)
 See you in Nov 2025 ov  Xxxx   IMPRESSION: 1. Pulmonary parenchymal pattern of interstitial lung disease and air trapping, as detailed above, stable from 12/08/2022. 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 Am JINNY Honey Crit Care Med Vol 198, Iss 5, [phone], Jan 03 2017. 2. Aortic atherosclerosis (ICD10-I70.0). Coronary artery calcification. 3. Enlarged pulmonic trunk, indicative of pulmonary arterial hypertension.     Electronically Signed   By: Newell Eke M.D.   On: 02/10/2024 16:39

## 2024-03-02 ENCOUNTER — Encounter: Payer: Self-pay | Admitting: Pharmacist

## 2024-03-02 ENCOUNTER — Ambulatory Visit

## 2024-03-02 DIAGNOSIS — I428 Other cardiomyopathies: Secondary | ICD-10-CM | POA: Diagnosis not present

## 2024-03-02 NOTE — Progress Notes (Signed)
   03/02/2024  Patient ID: Leonette GORMAN Picket, female   DOB: 04-29-1951, 73 y.o.   MRN: [id number]  [employer]Me application for Farxiga --provider section completed and signature obtained.  Faxed back to Luke Mall, CPhT for processing and scanning.  Lab Results  Component Value Date   HGBA1C 6.5 (H) 12/16/2023   HGBA1C 7.1 (H) 07/07/2023   HGBA1C 6.8 (H) 03/10/2023    Cassius DOROTHA Brought, PharmD, BCACP Clinical Pharmacist [phone]   Cassius DOROTHA Brought, PharmD, BCACP Clinical Pharmacist [phone]

## 2024-03-03 ENCOUNTER — Other Ambulatory Visit (HOSPITAL_COMMUNITY): Payer: Self-pay

## 2024-03-03 LAB — CUP PACEART REMOTE DEVICE CHECK
Battery Remaining Longevity: 102 mo
Battery Remaining Percentage: 100 %
Brady Statistic RA Percent Paced: 1 %
Brady Statistic RV Percent Paced: 100 %
Date Time Interrogation Session: 20251029090100
HighPow Impedance: 88 Ohm
Implantable Lead Connection Status: 753985
Implantable Lead Connection Status: 753985
Implantable Lead Connection Status: 753985
Implantable Lead Location: 753858
Implantable Lead Location: 753859
Implantable Lead Location: 753860
Implantable Lead Model: 4674
Implantable Lead Model: 5076
Implantable Lead Model: 692
Implantable Lead Serial Number: 501753
Implantable Lead Serial Number: 503110
Implantable Pulse Generator Implant Date: 20250724
Lead Channel Impedance Value: 1000 Ohm
Lead Channel Impedance Value: 372 Ohm
Lead Channel Impedance Value: 774 Ohm
Lead Channel Pacing Threshold Amplitude: 0.8 V
Lead Channel Pacing Threshold Amplitude: 0.9 V
Lead Channel Pacing Threshold Amplitude: 4 V
Lead Channel Pacing Threshold Pulse Width: 0.4 ms
Lead Channel Pacing Threshold Pulse Width: 0.4 ms
Lead Channel Pacing Threshold Pulse Width: 0.5 ms
Lead Channel Setting Pacing Amplitude: 2 V
Lead Channel Setting Pacing Amplitude: 2.5 V
Lead Channel Setting Pacing Amplitude: 4.5 V
Lead Channel Setting Pacing Pulse Width: 0.4 ms
Lead Channel Setting Pacing Pulse Width: 0.5 ms
Lead Channel Setting Sensing Sensitivity: 0.6 mV
Lead Channel Setting Sensing Sensitivity: 1 mV
Pulse Gen Serial Number: 364136
Zone Setting Status: 755011

## 2024-03-03 NOTE — Telephone Encounter (Signed)
 Received provider portion PAP application [employer] Farxiga .

## 2024-03-08 ENCOUNTER — Encounter: Payer: Self-pay | Admitting: Internal Medicine

## 2024-03-08 ENCOUNTER — Ambulatory Visit: Admitting: Internal Medicine

## 2024-03-08 VITALS — BP 116/64 | HR 79 | Temp 98.2°F | Ht 64.0 in | Wt 240.2 lb

## 2024-03-08 DIAGNOSIS — R0789 Other chest pain: Secondary | ICD-10-CM | POA: Diagnosis not present

## 2024-03-08 DIAGNOSIS — J849 Interstitial pulmonary disease, unspecified: Secondary | ICD-10-CM

## 2024-03-08 NOTE — Patient Instructions (Addendum)
 ICD-10-CM   1. ILD (interstitial lung disease) (HCC)  J84.9     2. Feeling of chest tightness  R07.89       You do have interstitial lung disease but it is mild and stable over time.  Suspected etiology or cause is down pillow.  Descriptions of air trapping are consistent with this and a condition called hypersensitive pneumonitis.  Since there is no exposure and it is mild and stable and expectant supportive care approach is the best  I do not know what to make of the nighttime mild intermittent occasional chest tightness.  But we could try some sample inhalers and see if it improves   Plan - Take sample Trelegy 100 strength and use it 1 puff once daily  - Call us  in 1 month to see if symptoms are improved and if so we will send in a prescription  - If symptoms persist then we will order allergy testing  -Otherwise interstitial lung disease continue monitoring approach  -Do spirometry and DLCO in 9 months  Follow-up Mean

## 2024-03-08 NOTE — Progress Notes (Signed)
 OV 02/10/2023  Subjective:  Patient ID: Leonette GORMAN Picket, female , DOB: [birth year] , age 73 y.o. , MRN: [id number] , ADDRESS: [address] [place] [address] PCP Jarold Medici, MD Patient Care Team: Jarold Medici, MD as PCP - General (Internal Medicine) Rudy Dorothyann DASEN, RPH-CPP (Pharmacist)  This Provider for this visit: Treatment Team:  Attending Provider: Geronimo Amel, MD    02/10/2023 -   Chief Complaint  Patient presents with   Consult    Ct 8/5 f/u      HPI Leonette GORMAN Picket 73 y.o. -new consult referred by Dr. Grayce Jarold for concern for interstitial lung disease on CT scan of the chest August 2024.  Review of the external medical records indicate that in June 2024 she saw PCP.  In 2021 there was a CT chest that showed 4 mm lung nodule therefore PCP did a CT chest and this showed interstitial lung abnormalities associated with groundglass opacities.  Therefore referred here.  Patient herself tells me she is not sure why she is here she is stable she has very minimal to no shortness of breath.  She suffers from morbid obesity and then she has had dyspnea on exertion and then at some point she did have a pacemaker/defibrillator placed.  After this the dyspnea resolved.  And she is been stable like this for at least since 2015.  Therefore she is really surprised about the referral here.  There is no cough or chest pain or wheezing.  Review of the visual images indicate that in 2011 she did have a CT abdomen.  Maybe this is mention of groundglass opacities in the right lung base anteriorly but is definitely present in the CT scan of the abdomen in 2021 along with posterior potential reticulation and anterior groundglass.  These findings are very subtle.  In the latest August 2024 CT chest in my personal opinion it is essentially unchanged.  There is no hospitalizations or ER visits  She does admit to acid reflux she is on fish oil but she believes acid  reflux is under control with omeprazole .  She does not have any autoimmune disease.  She does have a feather pillow or down pillow.  Of note she did see Dr. Ladona recently was started on Entresto .  She is asking for a chemistry panel to be checked so with her Entresto  can be increased.    OV 07/02/2023  Subjective:  Patient ID: Leonette GORMAN Picket, female , DOB: 1950/06/15 , age 46 y.o. , MRN: [id number] , ADDRESS: [address] [place] [address] PCP Jarold Medici, MD Patient Care Team: Jarold Medici, MD as PCP - General (Internal Medicine)  This Provider for this visit: Treatment Team:  Attending Provider: Geronimo Amel, MD  For interstitial lung abnormalities [referred for interstitial lung disease].  07/02/2023 -   Chief Complaint  Patient presents with   Follow-up     HPI Francina Beery Meador 73 y.o. -presents for follow-up.  In the interim she is gotten rid of feather pillow.  She also stopped using fish oil.  She continues to remain minimally symptomatic having shortness of breath while climbing stairs.  This is still an improved position since having her defibrillator/pacer placed.  She continues to remain morbidly obese.  There is no wheezing or cough.  She did autoimmune workup and ANA is 1: 160 but she denies any oral ulcers, photophobia, rash, [place] or any significant arthritis beyond osteoarthritis.  She  had pulmonary function test and this is normal.  Again visualized the CT chest and short of the mild ILD findings.       OV 03/08/2024  Subjective:  Patient ID: Leonette GORMAN Picket, female , DOB: 1950/10/18 , age 16 y.o. , MRN: [id number] , ADDRESS: [address] [place] [address] PCP Jarold Medici, MD Patient Care Team: Jarold Medici, MD as PCP - General (Internal Medicine) Ladona Heinz, MD as PCP - Cardiology (Cardiology) Mealor, Eulas BRAVO, MD as PCP - Electrophysiology (Cardiology) Jolee Cassius PARAS, [facility] as Pharmacist (Pharmacist) Jolee Cassius PARAS, [facility] as Pharmacist (Pharmacist)  This Provider for this visit: Treatment Team:  Attending Provider: Geronimo Amel, MD    03/08/2024 -   Chief Complaint  Patient presents with   Interstitial Lung Disease    Pt states since LOV breathing has been up and down, some nights she feels like she can't breathe at night    Follow-up interstitial lung disease/interstitial lung abnormalities -history of down pillow exposure.  HPI Denea Cheaney Willingham 73 y.o. -DEVONY MCGRADY is a 73 year old female who presents for follow-up of her ILD/ILD.  Last seen in February 2025.  Since then shortness of breath is stable.  No major health issues except she had routine cataract replacements in June and July 2025 followed by routine pacemaker change in August 2025.  Only major issue since October 2025 is his chest tightness/knife in the back sensation the pain is sharp, occurs with deep breaths, and feels like a knife in the back. It occurs every couple of weeks, wakes her up at night, and is alleviated by moving around and taking slow, deep breaths. No associated wheezing, shortness of breath, or chills.  No recent hospital admissions or emergency room visits aside from the mentioned procedures. She has not experienced any major new health issues since her last visit earlier in the year.   She had high-resolution CT chest October 2025 and I personally visualized it.  Early on I thought it was ILA but I agree this ILD because there is more than 5% involvement.  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  SYMPTOM SCALE - ILD 07/02/2023 03/08/2024   Current weight    O2 use ra ra  Shortness of Breath 0 -> 5 scale with 5 being worst (score 6 If unable to do)   At rest 1 0/5  Simple tasks - showers, clothes change, eating, shaving 0 0  Household (dishes, doing bed, laundry) 0 0  Shopping 2 0  Walking level at own pace 1 0.5  Walking up Stairs 5 0.5  Total (30-36) Dyspnea Score 8 1.5   How bad is your cough? 1 0  How bad is your fatigue 0 0  How bad is nausea 0 0  How bad is vomiting?  0 0  How bad is diarrhea? 00 0  How bad is anxiety? 0 0  How bad is depression 0 0  Any chronic pain - if so where and how bad 0 0        Latest Reference Range & Units 02/10/23 14:07  IgE (Immunoglobulin E), Serum <OR=114 kU/L 52  Anti Nuclear Antibody (ANA) NEGATIVE  POSITIVE !  ANA Pattern 1  Nuclear, Homogeneous !  ANA Titer 1 titer 1:160 (H)  Cyclic Citrullin Peptide Ab UNITS <16  SSA (Ro) (ENA) Antibody, IgG <1.0 NEG AI <1.0 NEG  SSB (La) (ENA) Antibody, IgG <1.0 NEG AI <1.0 NEG  Scleroderma (Scl-70) (ENA) Antibody, IgG <1.0 NEG AI <1.0 NEG  !: Data is abnormal (H): Data is abnormally high     Simple office walk 224 (66+46 x 2) feet Pod A at [facility] x  3 laps goal with forehead probe 07/02/2023    O2 used ra   Number laps completed Sit stand x 15   Comments about pace Regular   Resting Pulse Ox/HR 100% % and 73/min   Final Pulse Ox/HR 93% % and 86/min   Desaturated </= 88% No   Desaturated <= 3% points Yes   Got Tachycardic >/= 90/min No   Symptoms at end of test x   Miscellaneous comments x     CT Chest data from date: 02/09/24  - personally visualized and independently interpreted : yes - my findings are: agree with Narrative & Impression  CLINICAL DATA:  Interstitial lung disease.  Lung nodules.   EXAM: CT CHEST WITHOUT CONTRAST   TECHNIQUE: Multidetector CT imaging of the chest was performed following the standard protocol without intravenous contrast. High resolution imaging of the lungs, as well as inspiratory and expiratory imaging, was performed.   RADIATION DOSE REDUCTION: This exam was performed according to the departmental dose-optimization program which includes automated exposure control, adjustment of the mA and/or kV according to patient size and/or use of iterative reconstruction technique.   COMPARISON:  12/08/2022.    FINDINGS: Cardiovascular: Atherosclerotic calcification of the aorta, aortic valve and coronary arteries. Heart is at the upper limits of normal in size to mildly enlarged. No pericardial effusion. Enlarged pulmonic trunk.   Mediastinum/Nodes: No pathologically enlarged mediastinal or axillary lymph nodes. Hilar regions are difficult to definitively evaluate without IV contrast. Esophagus is grossly unremarkable.   Lungs/Pleura: Mild peripheral and basilar coarsened ground-glass, subpleural reticulation and traction bronchiectasis/bronchiolectasis, similar to 12/08/2022. There is somewhat of an interstitial component as well. No honeycombing. Findings persist on prone imaging. Calcified granulomas. No pleural fluid. Airway is unremarkable. There is air trapping.   Upper Abdomen: Cholecystectomy. Slight thickening of the lateral limb left adrenal gland. No specific follow-up necessary. Visualized portions of the liver, adrenal glands, kidneys, spleen, pancreas, stomach and bowel are otherwise grossly unremarkable. No upper abdominal adenopathy.   Musculoskeletal: Degenerative changes in the spine.   IMPRESSION: 1. Pulmonary parenchymal pattern of interstitial lung disease and air trapping, as detailed above, stable from 12/08/2022. 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 Am JINNY Honey Crit Care Med Vol 198, Iss 5, [phone], Jan 03 2017. 2. Aortic atherosclerosis (ICD10-I70.0). Coronary artery calcification. 3. Enlarged pulmonic trunk, indicative of pulmonary arterial hypertension.     Electronically Signed   By: Newell Eke M.D.   On: 02/10/2024 16:39    PFT     Latest Ref Rng & Units 06/30/2023    8:04 AM  PFT Results  FVC-Pre L 2.21   FVC-Predicted Pre % 79   FVC-Post L  2.26   FVC-Predicted Post % 80   Pre FEV1/FVC % % 89   Post FEV1/FCV % % 89   FEV1-Pre L 1.96   FEV1-Predicted Pre % 93   FEV1-Post L 2.00   DLCO uncorrected ml/min/mmHg 17.43   DLCO UNC% % 92   DLCO corrected ml/min/mmHg 17.43   DLCO COR %Predicted % 92   DLVA Predicted % 115   TLC L 4.09   TLC % Predicted %  82   RV % Predicted % 77        LAB RESULTS last 96 hours No results found.       has a past medical history of AICD (automatic cardioverter/defibrillator) present, Arthritis, Asthma, Carpal tunnel syndrome, bilateral, CKD (chronic kidney disease), Diabetes mellitus, Encounter for assessment of implantable cardioverter-defibrillator (ICD), GERD (gastroesophageal reflux disease), Hyperlipemia, Hypertension, Obesity, Sleep apnea, and Wears glasses.   reports that she has never smoked. She has never used smokeless tobacco.  Past Surgical History:  Procedure Laterality Date   ABDOMINAL HYSTERECTOMY     BIOPSY  03/01/2019   Procedure: BIOPSY;  Surgeon: Kristie Lamprey, MD;  Location: [facility];  Service: Endoscopy;;   BREAST SURGERY  1968   breast mass excision   CARPAL TUNNEL RELEASE Left 07/10/2017   Procedure: CARPAL TUNNEL RELEASE;  Surgeon: Gillie Duncans, MD;  Location: [facility] OR;  Service: Neurosurgery;  Laterality: Left;  left   CARPAL TUNNEL RELEASE Right 03/19/2018   Procedure: RIGHT CARPAL TUNNEL RELEASE;  Surgeon: Gillie Duncans, MD;  Location: [facility] OR;  Service: Neurosurgery;  Laterality: Right;  right   CERVICAL SPINE SURGERY  2006   CHOLECYSTECTOMY     COLONOSCOPY WITH PROPOFOL  N/A 03/01/2019   Procedure: COLONOSCOPY WITH PROPOFOL ;  Surgeon: Kristie Lamprey, MD;  Location: [facility];  Service: Endoscopy;  Laterality: N/A;   DILATION AND CURETTAGE OF UTERUS     ICD IMPLANT     JOINT REPLACEMENT     TONSILLECTOMY  1961   TOTAL KNEE ARTHROPLASTY Left 01/26/2018   Procedure: LEFT TOTAL KNEE ARTHROPLASTY;  Surgeon: Vernetta Lonni GRADE, MD;  Location: [facility] OR;   Service: Orthopedics;  Laterality: Left;   trigger fnger Left 11/2022    Allergies  Allergen Reactions   Other Other (See Comments)    NO BLOOD   . Jehovah witness    Immunization History  Administered Date(s) Administered   Fluad Quad(high Dose 65+) 02/07/2020, 02/12/2021, 03/18/2022, 02/14/2023, 02/08/2024   Hepatitis A 01/19/2012, 02/16/2012   Hepatitis B 01/19/2012, 02/16/2012   INFLUENZA, HIGH DOSE SEASONAL PF 02/17/2016, 03/04/2018, 02/10/2019   IPV 02/16/2012   Influenza-Unspecified 02/02/2014, 02/16/2014   MMR 04/04/1994   PFIZER(Purple Top)SARS-COV-2 Vaccination 06/11/2019, 07/02/2019, 02/21/2020, 03/03/2022   Pfizer Covid-19 Vaccine Bivalent Booster 13yrs & up 03/20/2021, 02/14/2023   Pneumococcal Conjugate-13 05/16/2020   Pneumococcal Polysaccharide-23 06/18/2021   Td 09/08/2006   Tdap 01/19/2012, 06/21/2020   Zoster Recombinant(Shingrix ) 06/04/2021, 10/21/2021   Zoster, Live 12/15/2007    Family History  Problem Relation Age of Onset   Hypertension Mother    Stroke Mother    Heart disease Father    Heart attack Sister    Heart attack Brother      Current Outpatient Medications:    acetaminophen  (TYLENOL ) 500 MG tablet, Take 500 mg by mouth every 6 (six) hours as needed for moderate pain., Disp: , Rfl:    albuterol  (PROVENTIL  HFA;VENTOLIN  HFA) 108 (90 Base) MCG/ACT inhaler, Inhale 2 puffs into the lungs every 4 (four) hours as needed for wheezing or shortness of breath., Disp: , Rfl:    carvedilol  (COREG ) 25 MG tablet, TAKE 1 AND 1/2 TABLETS BY MOUTH  TWICE DAILY, Disp: 300 tablet, Rfl: 2   Cholecalciferol  (VITAMIN D ) 50 MCG (2000 UT) tablet, Take 2,000 Units by mouth daily., Disp: , Rfl:    Continuous Glucose Sensor (FREESTYLE LIBRE 3 PLUS SENSOR) MISC, Change sensor every 15 days., Disp: 6 each, Rfl: 3   ENTRESTO  49-51 MG, TAKE 1 TABLET BY  MOUTH TWICE A DAY, Disp: 180 tablet, Rfl: 1   famotidine  (PEPCID ) 20 MG tablet, TAKE 1 TABLET BY MOUTH DAILY AS  NEEDED,  Disp: 100 tablet, Rfl: 2   FARXIGA  10 MG TABS tablet, TAKE 1 TABLET BY MOUTH EVERY DAY, Disp: 30 tablet, Rfl: 2   fluticasone  (FLONASE ) 50 MCG/ACT nasal spray, Place 1 spray into both nostrils daily. (Patient taking differently: Place 1 spray into both nostrils as needed for allergies.), Disp: 16 g, Rfl: 2   furosemide  (LASIX ) 40 MG tablet, TAKE 1 TABLET BY MOUTH EVERY DAY IN THE MORNING, Disp: 90 tablet, Rfl: 1   hydrALAZINE  (APRESOLINE ) 50 MG tablet, TAKE 1 TABLET BY MOUTH TWICE  DAILY, Disp: 200 tablet, Rfl: 2   hydrocortisone 2.5 % cream, Apply 1 Application topically every other day., Disp: , Rfl:    hydroquinone 4 % cream, Apply topically once a week., Disp: , Rfl:    insulin  lispro protamine-lispro (HUMALOG  75/25 MIX) (75-25) 100 UNIT/ML SUSP injection, Inject 10 Units into the skin 2 (two) times daily with a meal., Disp: , Rfl:    ketoconazole (NIZORAL) 2 % shampoo, Apply 1 Application topically 2 (two) times a week., Disp: , Rfl:    nystatin  (NYSTATIN ) powder, Apply topically 3 (three) times daily as needed. As needed, Disp: 60 g, Rfl: 2   omeprazole  (PRILOSEC) 40 MG capsule, TAKE 1 CAPSULE BY MOUTH DAILY  BEFORE BREAKFAST, Disp: 100 capsule, Rfl: 2   pravastatin  (PRAVACHOL ) 40 MG tablet, Take 1 tablet (40 mg total) by mouth daily., Disp: 100 tablet, Rfl: 2   Probiotic Product (PROBIOTIC PO), Take 1 capsule by mouth at bedtime., Disp: , Rfl:    pyridOXINE (VITAMIN B6) 25 MG tablet, Take 25 mg by mouth daily., Disp: , Rfl:    Semaglutide , 2 MG/DOSE, 8 MG/3ML SOPN, Inject 2 mg as directed once a week., Disp: 3 mL, Rfl: 2   vitamin B-12 (CYANOCOBALAMIN) 100 MCG tablet, Take 100 mcg by mouth daily., Disp: , Rfl:    VYZULTA 0.024 % SOLN, Apply 1 drop to eye every evening., Disp: , Rfl:    gatifloxacin (ZYMAXID) 0.5 % SOLN, Place 1 drop into the right eye 4 (four) times daily. (Patient not taking: Reported on 03/08/2024), Disp: , Rfl:       Objective:   Vitals:   03/08/24 1413  BP: 116/64   Pulse: 79  Temp: 98.2 F (36.8 C)  TempSrc: Oral  SpO2: 99%  Weight: 240 lb 3.2 oz (109 kg)  Height: 5' 4 (1.626 m)    Estimated body mass index is 41.23 kg/m as calculated from the following:   Height as of this encounter: 5' 4 (1.626 m).   Weight as of this encounter: 240 lb 3.2 oz (109 kg).  @WEIGHTCHANGE @  Filed Weights   03/08/24 1413  Weight: 240 lb 3.2 oz (109 kg)     Physical Exam   General: No distress. Loooks well O2 at rest: no Cane present: no Sitting in wheel chair: no Frail: no Obese: no Neuro: Alert and Oriented x 3. GCS 15. Speech normal Psych: Pleasant Resp:  Barrel Chest - no.  Wheeze - no, Crackles - no, No overt respiratory distress CVS: Normal heart sounds. Murmurs - no Ext: Stigmata of Connective Tissue Disease - no HEENT: Normal upper airway. PEERL +. No post nasal drip        Assessment/     Assessment & Plan ILD (interstitial lung disease) (HCC)  Feeling of chest tightness  PLAN Patient Instructions     ICD-10-CM   1. ILD (interstitial lung disease) (HCC)  J84.9     2. Feeling of chest tightness  R07.89       You do have interstitial lung disease but it is mild and stable over time.  Suspected etiology or cause is down pillow.  Descriptions of air trapping are consistent with this and a condition called hypersensitive pneumonitis.  Since there is no exposure and it is mild and stable and expectant supportive care approach is the best  I do not know what to make of the nighttime mild intermittent occasional chest tightness.  But we could try some sample inhalers and see if it improves   Plan - Take sample Trelegy 100 strength and use it 1 puff once daily  - Call us  in 1 month to see if symptoms are improved and if so we will send in a prescription  - If symptoms persist then we will order allergy testing  -Otherwise interstitial lung disease continue monitoring approach  -Do spirometry and DLCO in 9  months  Follow-up Mean    FOLLOWUP    Return for - 9 months with nurse practitioner but after spirometry and DLCO.  (Level 04 E&M 2024: Estb >= 30 min l care time and counseling or/and coordination of care by this undersigned MD - Dr Dorethia Cave. This includes one or more of the following on this same day 03/08/2024: pre-charting, chart review, note writing, documentation discussion of test results, diagnostic or treatment recommendations, prognosis, risks and benefits of management options, instructions, education, compliance or risk-factor reduction. It excludes time spent by the CMA or office staff in the care of the patient . Actual time is 30 min)   SIGNATURE    Dr. Dorethia Cave, M.D., F.C.C.P,  Pulmonary and Critical Care Medicine Staff Physician, [facility] Center Director - Interstitial Lung Disease  Program  Pulmonary Fibrosis [facility] [place], [place], [address]  Pager: [phone], If no answer or between  15:00h - 7:00h: call [phone] Telephone: [phone]  2:48 PM 03/08/2024

## 2024-03-09 NOTE — Progress Notes (Signed)
 Remote ICD Transmission

## 2024-03-10 ENCOUNTER — Ambulatory Visit: Payer: Self-pay | Admitting: Cardiovascular Disease

## 2024-03-23 ENCOUNTER — Telehealth: Payer: Self-pay

## 2024-03-23 ENCOUNTER — Encounter: Payer: Self-pay | Admitting: Pharmacist

## 2024-03-23 NOTE — Telephone Encounter (Signed)
 Patient reached out regarding PAP re-enrollment on multiple meds.   AZ&ME re-enrollment discussed on 02/10/24 telephone note.  Patient rec'd re-enrollment letter from [facility] but no actual application. She would like this mailed to her for Humalog  med. For any questions she is open to a phone call to her cell.  Patient was also unaware of the Novo Nordisk changes for her Ozempic . I discussed LIS/Extra Help option. Patient doesn't qualify based on household size & annnual income (over limit). She will reach out to Medicare about the payment plan in January. She is aware if this is still unaffordable for her, she will talk to her doctor.

## 2024-03-23 NOTE — Telephone Encounter (Signed)
 PAP: Patient assistance application for Humalog  through [employer] has been mailed to pt's home address on file. Provider portion of application will be faxed to provider's office. Humalog  75/25 mix

## 2024-03-23 NOTE — Progress Notes (Signed)
   03/23/2024  Patient ID: Yvonne Miller, female   DOB: 1950/09/04, 73 y.o.   MRN: [id number]  Patient assistance form for Humalog  Mix was completed and the Provider signature was completed. Forms were faxed back to Luke Cecille Sola for scanning and processing.   Lab Results  Component Value Date   HGBA1C 6.5 (H) 12/16/2023   HGBA1C 7.1 (H) 07/07/2023   HGBA1C 6.8 (H) 03/10/2023     Cassius DOROTHA Brought, PharmD, BCACP Clinical Pharmacist [phone]

## 2024-03-23 NOTE — Telephone Encounter (Signed)
 Rec'd call from patient regarding patient assistance.   Patient never rec'd AZ&ME application in mail, however she was given company phone number and will call to confirm her 2026 enrollment.

## 2024-03-30 NOTE — Telephone Encounter (Signed)
 Received Provider portion PAP application ([employer]) Humalog  75/25 mix.

## 2024-03-31 ENCOUNTER — Other Ambulatory Visit: Payer: Self-pay | Admitting: Internal Medicine

## 2024-04-05 ENCOUNTER — Encounter: Payer: Self-pay | Admitting: Internal Medicine

## 2024-04-05 ENCOUNTER — Encounter: Payer: Self-pay | Admitting: Cardiology

## 2024-04-05 ENCOUNTER — Other Ambulatory Visit: Payer: Self-pay

## 2024-04-05 MED ORDER — FREESTYLE LIBRE 3 PLUS SENSOR MISC: 3 refills | Status: AC

## 2024-04-12 ENCOUNTER — Telehealth: Payer: Self-pay | Admitting: Cardiology

## 2024-04-12 MED ORDER — HYDRALAZINE HCL 50 MG PO TABS: 50.0000 mg | ORAL_TABLET | Freq: Two times a day (BID) | ORAL | 1 refills | Status: AC

## 2024-04-12 NOTE — Telephone Encounter (Signed)
 PAP: Application for Humalog  has been submitted to [employer], via fax  HUMALOG  MIX

## 2024-04-12 NOTE — Telephone Encounter (Signed)
*  STAT* If patient is at the pharmacy, call can be transferred to refill team.   1. Which medications need to be refilled? (please list name of each medication and dose if known) hydrALAZINE  (APRESOLINE ) 50 MG tablet    2. Would you like to learn more about the convenience, safety, & potential cost savings by using the [facility]?     3. Are you open to using the [facility] (Type [facility].  ).   4. Which pharmacy/location (including street and city if local pharmacy) is medication to be sent to? [id number] - [place], [place] - [address] AT [address]OSS [address]    5. Do they need a 30 day or 90 day supply? 90 day   Pt out of medication

## 2024-04-18 NOTE — Telephone Encounter (Deleted)
 PAP: Patient assistance application for Humalog  has been approved by PAP Companies: NovoNordisk from 05/05/2024 to 05/04/2025. Medication should be delivered to PAP Delivery: Provider's office. For further shipping updates, please contact Novo Nord[employer] at [phone]. Patient ID is: [id number]

## 2024-04-18 NOTE — Telephone Encounter (Signed)
 PAP: Patient assistance application for Humalog  has been approved by PAP Companies: [employer] from 05/05/2024 to 05/04/2025. Medication should be delivered to PAP Delivery: Provider's office. For further shipping updates, please contact [employer] at [phone]. Patient ID is: Yvonne Miller

## 2024-04-23 ENCOUNTER — Encounter: Payer: Self-pay | Admitting: Internal Medicine

## 2024-04-25 ENCOUNTER — Telehealth: Payer: Self-pay | Admitting: Pharmacist

## 2024-04-25 DIAGNOSIS — Z794 Long term (current) use of insulin: Secondary | ICD-10-CM

## 2024-04-25 MED ORDER — SEMAGLUTIDE (2 MG/DOSE) 8 MG/3ML ~~LOC~~ SOPN: 2.0000 mg | PEN_INJECTOR | SUBCUTANEOUS | 2 refills | Status: AC

## 2024-04-25 MED ORDER — SEMAGLUTIDE (2 MG/DOSE) 8 MG/3ML ~~LOC~~ SOPN: 2.0000 mg | PEN_INJECTOR | SUBCUTANEOUS | 2 refills | Status: DC

## 2024-04-25 NOTE — Progress Notes (Addendum)
 "  Received a message that the Patient wondered about getting Ozempic  through patient assistance. Patient was called.  HIPAA identifiers were obtained.  It was explained to the Patient that the shipment she received in September would be the last one she would receive from [facility] since they will no longer provide Ozempic  through their Patient Assistance Program.    Patient said she thought she had already reached the point in her medication spending that her medications are now costing her $0.   As such a three month supply of Ozempic  will be sent to her Pharmacy co-signature required.  Her blood sugars were reviewed in Libreview:      Patient's medications were reviewed via telephone:  Medications Reviewed Today     Reviewed by Jolee Cassius PARAS, [facility] (Pharmacist) on 04/25/24 at 1619  Med List Status: <None>   Medication Order Taking? Sig Documenting Provider Last Dose Status Informant  acetaminophen  (TYLENOL ) 500 MG tablet [id number] Yes Take 500 mg by mouth every 6 (six) hours as needed for moderate pain. [provider]  Active   albuterol  (PROVENTIL  HFA;VENTOLIN  HFA) 108 (90 Base) MCG/ACT inhaler [id number] Yes Inhale 2 puffs into the lungs every 4 (four) hours as needed for wheezing or shortness of breath. [provider]  Active Self  carvedilol  (COREG ) 25 MG tablet [id number] Yes TAKE 1 AND 1/2 TABLETS BY MOUTH  TWICE DAILY Ladona Heinz, MD  Active   Cholecalciferol  (VITAMIN D ) 50 MCG (2000 UT) tablet [id number] Yes Take 2,000 Units by mouth daily. [provider]  Active   Continuous Glucose Sensor (FREESTYLE LIBRE 3 PLUS SENSOR) MISC [id number] Yes Change sensor every 15 days. Jarold Medici, MD  Active   ENTRESTO  49-51 MG [id number] Yes TAKE 1 TABLET BY MOUTH TWICE A DAY Ganji, Jay, MD  Active   famotidine  (PEPCID ) 20 MG tablet [id number] Yes TAKE 1 TABLET BY MOUTH DAILY AS  NEEDED Jarold Medici, MD  Active   FARXIGA  10 MG TABS tablet [id number] Yes TAKE 1  TABLET BY MOUTH EVERY DAY Jarold Medici, MD  Active   fluticasone  (FLONASE ) 50 MCG/ACT nasal spray [id number] Yes Place 1 spray into both nostrils daily. Jarold Medici, MD  Active   furosemide  (LASIX ) 40 MG tablet [id number] Yes TAKE 1 TABLET BY MOUTH EVERY DAY IN THE ERMALINDA Ladona Heinz, MD  Active    Patient not taking:   Discontinued 04/25/24 1601   hydrALAZINE  (APRESOLINE ) 50 MG tablet [id number] Yes Take 1 tablet (50 mg total) by mouth 2 (two) times daily. Ganji, Jay, MD  Active   hydrocortisone 2.5 % cream [id number] Yes Apply 1 Application topically every other day. [provider]  Active   hydroquinone 4 % cream [id number] Yes Apply topically once a week. [provider]  Active   insulin  lispro protamine-lispro (HUMALOG  75/25 MIX) (75-25) 100 UNIT/ML SUSP injection [id number] Yes Inject 10 Units into the skin 2 (two) times daily with a meal. [provider]  Active   ketoconazole (NIZORAL) 2 % shampoo [id number] Yes Apply 1 Application topically 2 (two) times a week. [provider]  Active   nystatin  (NYSTATIN ) powder [id number] Yes Apply topically 3 (three) times daily as needed. As needed Jarold Medici, MD  Active   omeprazole  (PRILOSEC) 40 MG capsule [id number] Yes TAKE 1 CAPSULE BY MOUTH DAILY  BEFORE OFILIA Jarold Medici, MD  Active   pravastatin  (PRAVACHOL ) 40 MG tablet [id number] Yes Take 1 tablet (40 mg total) by mouth daily. Jarold Medici, MD  Active   Probiotic Product (PROBIOTIC PO) [id number] Yes Take 1 capsule by mouth at bedtime. [provider]  Active   pyridOXINE (VITAMIN B6) 25 MG tablet [id number] Yes Take 25 mg by mouth daily. [provider]  Active   Semaglutide , 2 MG/DOSE, 8 MG/3ML SOPN [id number]  Inject 2 mg as directed once a week. Jarold Medici, MD  Active   vitamin B-12 (CYANOCOBALAMIN) 100 MCG tablet [id number] Yes Take 100 mcg by mouth daily. [provider]  Active   VYZULTA 0.024 % SOLN [id number] Yes  Apply 1 drop to eye every evening. [provider]  Active            Plan: Patient does not have an upcoming PCP appointment until 08/2024.  A message was sent to Dr. Jarold' CMA to reach out to the Patient to get her scheduled for another appointment. Send 3 month supply Ozempic  script to her local pharmacy per request.  Cassius DOROTHA Brought, PharmD, BCACP Clinical Pharmacist [phone]   ADDENDUM  The office received a notice dated 04/11/24 stating the Patient could get a refill.  Refill/reorder form was completed and faxed to [facility] and to [facility], CPhT for scanning.   Cassius DOROTHA Brought, PharmD, BCACP Clinical Pharmacist [phone]  "

## 2024-04-26 ENCOUNTER — Ambulatory Visit: Payer: Self-pay | Admitting: Internal Medicine

## 2024-04-27 ENCOUNTER — Ambulatory Visit: Admitting: Internal Medicine

## 2024-04-27 ENCOUNTER — Encounter: Payer: Self-pay | Admitting: Internal Medicine

## 2024-04-27 VITALS — BP 130/78 | HR 76 | Temp 98.4°F | Ht 64.0 in | Wt 237.0 lb

## 2024-04-27 DIAGNOSIS — Z794 Long term (current) use of insulin: Secondary | ICD-10-CM | POA: Diagnosis not present

## 2024-04-27 DIAGNOSIS — I428 Other cardiomyopathies: Secondary | ICD-10-CM

## 2024-04-27 DIAGNOSIS — I130 Hypertensive heart and chronic kidney disease with heart failure and stage 1 through stage 4 chronic kidney disease, or unspecified chronic kidney disease: Secondary | ICD-10-CM | POA: Diagnosis not present

## 2024-04-27 DIAGNOSIS — E1122 Type 2 diabetes mellitus with diabetic chronic kidney disease: Secondary | ICD-10-CM

## 2024-04-27 DIAGNOSIS — I5022 Chronic systolic (congestive) heart failure: Secondary | ICD-10-CM | POA: Diagnosis not present

## 2024-04-27 DIAGNOSIS — N183 Chronic kidney disease, stage 3 unspecified: Secondary | ICD-10-CM

## 2024-04-27 NOTE — Progress Notes (Signed)
 I,Yvonne Miller, CMA,acting as a neurosurgeon for Yvonne LOISE Slocumb, MD.,have documented all relevant documentation on the behalf of Yvonne LOISE Slocumb, MD,as directed by  Yvonne LOISE Slocumb, MD while in the presence of Yvonne LOISE Slocumb, MD.  Subjective:  Patient ID: Yvonne Miller , female    DOB: 07-03-1950 , 73 y.o.   MRN: [id number]  Chief Complaint  Patient presents with   Hypertension    Patient presents today for a bp and dm follow up, Patient reports compliance with medication. Patient denies any chest pain, SOB, or headaches. Patient has no concerns today.    Diabetes    HPI Discussed the use of AI scribe software for clinical note transcription with the patient, who gave verbal consent to proceed.  History of Present Illness Yvonne Miller is a 73 year old female with diabetes who presents for a diabetes check.  Her blood sugar levels have been well-controlled, with 93% of readings in range over the last 90 days. She experiences very few hypoglycemic episodes but more frequent hyperglycemic episodes. She uses a continuous glucose monitor with six days left on her current sensor. Her diabetes medications include insulin  (Hemolog) at 12 units twice a day and Ozempic , which she recently received a three-month supply of for free. She has not yet checked with her insurance regarding future costs for Ozempic .  She consumes diabetic drinks occasionally, particularly when anticipating blood work, and tries to maintain a healthy diet. She walks at least a mile a day and feels her endurance has improved. She drinks about three 15-ounce bottles of water daily, aiming for four.  Her current medications also include carvedilol  25 mg, one and a half tablets twice a day, Entresto  twice a day, Farxiga  10 mg daily, and hydralazine . She receives Farxiga  through patient assistance. She continues to use Vyzulta eye drops for glaucoma, which cost $100 a month.  No issues with reflux and no other concerns at this  time. Her family history includes a brother who had dementia and passed away at 35.   Diabetes She presents for her follow-up diabetic visit. She has type 2 diabetes mellitus. Her disease course has been stable. There are no hypoglycemic associated symptoms. Pertinent negatives for diabetes include no blurred vision, no chest pain, no polydipsia, no polyphagia and no polyuria. There are no hypoglycemic complications. Diabetic complications include nephropathy. Risk factors for coronary artery disease include diabetes mellitus, dyslipidemia, hypertension, obesity and sedentary lifestyle. Current diabetic treatment includes insulin  injections. She is compliant with treatment some of the time. She is following a generally healthy diet. She participates in exercise intermittently. Her breakfast blood glucose is taken between 8-9 am. Her breakfast blood glucose range is generally 110-130 mg/dl. An ACE inhibitor/angiotensin II receptor blocker is being taken.  Hypertension This is a chronic problem. The current episode started more than 1 year ago. The problem has been gradually improving since onset. The problem is controlled. Pertinent negatives include no blurred vision, chest pain, palpitations or shortness of breath. The current treatment provides moderate improvement. Compliance problems include exercise.  Hypertensive end-organ damage includes kidney disease and heart failure.     Past Medical History:  Diagnosis Date   AICD (automatic cardioverter/defibrillator) present    Arthritis    Asthma    Carpal tunnel syndrome, bilateral    CKD (chronic kidney disease)    Diabetes mellitus    Encounter for assessment of implantable cardioverter-defibrillator (ICD)    GERD (gastroesophageal reflux disease)  Hyperlipemia    Hypertension    Obesity    morbid   Sleep apnea    wears CPAP set at 12   Wears glasses      Family History  Problem Relation Age of Onset   Hypertension  Mother    Stroke Mother    Heart disease Father    Heart attack Sister    Heart attack Brother      Current Outpatient Medications:    acetaminophen  (TYLENOL ) 500 MG tablet, Take 500 mg by mouth every 6 (six) hours as needed for moderate pain., Disp: , Rfl:    albuterol  (PROVENTIL  HFA;VENTOLIN  HFA) 108 (90 Base) MCG/ACT inhaler, Inhale 2 puffs into the lungs every 4 (four) hours as needed for wheezing or shortness of breath., Disp: , Rfl:    carvedilol  (COREG ) 25 MG tablet, TAKE 1 AND 1/2 TABLETS BY MOUTH  TWICE DAILY, Disp: 300 tablet, Rfl: 2   Cholecalciferol  (VITAMIN D ) 50 MCG (2000 UT) tablet, Take 2,000 Units by mouth daily., Disp: , Rfl:    Continuous Glucose Sensor (FREESTYLE LIBRE 3 PLUS SENSOR) MISC, Change sensor every 15 days., Disp: 6 each, Rfl: 3   ENTRESTO  49-51 MG, TAKE 1 TABLET BY MOUTH TWICE A DAY, Disp: 180 tablet, Rfl: 1   famotidine  (PEPCID ) 20 MG tablet, TAKE 1 TABLET BY MOUTH DAILY AS  NEEDED, Disp: 100 tablet, Rfl: 2   FARXIGA  10 MG TABS tablet, TAKE 1 TABLET BY MOUTH EVERY DAY, Disp: 30 tablet, Rfl: 2   fluticasone  (FLONASE ) 50 MCG/ACT nasal spray, Place 1 spray into both nostrils daily., Disp: 16 g, Rfl: 2   furosemide  (LASIX ) 40 MG tablet, TAKE 1 TABLET BY MOUTH EVERY DAY IN THE MORNING, Disp: 90 tablet, Rfl: 1   hydrALAZINE  (APRESOLINE ) 50 MG tablet, Take 1 tablet (50 mg total) by mouth 2 (two) times daily., Disp: 180 tablet, Rfl: 1   hydrocortisone 2.5 % cream, Apply 1 Application topically every other day., Disp: , Rfl:    hydroquinone 4 % cream, Apply topically once a week., Disp: , Rfl:    insulin  lispro protamine-lispro (HUMALOG  75/25 MIX) (75-25) 100 UNIT/ML SUSP injection, Inject 10 Units into the skin 2 (two) times daily with a meal., Disp: , Rfl:    ketoconazole (NIZORAL) 2 % shampoo, Apply 1 Application topically 2 (two) times a week., Disp: , Rfl:    nystatin  (NYSTATIN ) powder, Apply topically 3 (three) times daily as needed. As  needed, Disp: 60 g, Rfl: 2   omeprazole  (PRILOSEC) 40 MG capsule, TAKE 1 CAPSULE BY MOUTH DAILY  BEFORE BREAKFAST, Disp: 100 capsule, Rfl: 2   pravastatin  (PRAVACHOL ) 40 MG tablet, Take 1 tablet (40 mg total) by mouth daily., Disp: 100 tablet, Rfl: 2   Probiotic Product (PROBIOTIC PO), Take 1 capsule by mouth at bedtime., Disp: , Rfl:    pyridOXINE (VITAMIN B6) 25 MG tablet, Take 25 mg by mouth daily., Disp: , Rfl:    Semaglutide , 2 MG/DOSE, 8 MG/3ML SOPN, Inject 2 mg as directed once a week., Disp: 9 mL, Rfl: 2   vitamin B-12 (CYANOCOBALAMIN) 100 MCG tablet, Take 100 mcg by mouth daily., Disp: , Rfl:    VYZULTA 0.024 % SOLN, Apply 1 drop to eye every evening., Disp: , Rfl:    Allergies  Allergen Reactions   Other Other (See Comments)    NO BLOOD   . Jehovah witness     Review of Systems  Constitutional: Negative.   Eyes:  Negative for blurred vision.  Respiratory: Negative.  Negative for shortness of breath.   Cardiovascular: Negative.  Negative for chest pain and palpitations.  Gastrointestinal: Negative.   Endocrine: Negative for polydipsia, polyphagia and polyuria.  Neurological: Negative.   Psychiatric/Behavioral: Negative.       Today's Vitals   04/27/24 1159  BP: 130/78  Pulse: 76  Temp: 98.4 F (36.9 C)  TempSrc: Oral  Weight: 237 lb (107.5 kg)  Height: 5' 4 (1.626 m)  PainSc: 0-No pain   Body mass index is 40.68 kg/m.  Wt Readings from Last 3 Encounters:  04/27/24 237 lb (107.5 kg)  03/08/24 240 lb 3.2 oz (109 kg)  12/16/23 232 lb (105.2 kg)    The 10-year ASCVD risk score (Arnett DK, et al., 2019) is: 22.7%   Values used to calculate the score:     Age: 66 years     Clinically relevant sex: Female     Is Non-Hispanic African American: Yes     Diabetic: Yes     Tobacco smoker: No     Systolic Blood Pressure: 130 mmHg     Is BP treated: Yes     HDL Cholesterol: 41 mg/dL     Total Cholesterol: 144 mg/dL  Objective:  Physical Exam       Assessment And Plan:   Assessment & Plan Type 2 diabetes mellitus with stage 3 chronic kidney disease, with long-term current use of insulin , unspecified whether stage 3a or 3b CKD (HCC)  Hypertensive heart and renal disease with renal failure, stage 1 through stage 4 or unspecified chronic kidney disease, with heart failure (HCC)  Non-ischemic cardiomyopathy (HCC)  Chronic systolic heart failure (HCC)  Morbid obesity due to excess calories (HCC)   Assessment & Plan Type 2 diabetes mellitus with diabetic chronic kidney disease Blood glucose levels well-controlled with 93% in range. Occasional hyperglycemia due to dietary indiscretions. Ozempic  supply ending, insurance coverage needs confirmation. Farxiga  obtained through assistance. - Continue insulin . - Check insurance for Ozempic  cost post-free supply. - Continue Farxiga  through assistance program.  Chronic systolic heart failure with non-ischemic cardiomyopathy Managed with carvedilol , Entresto , and hydralazine . - Continue carvedilol , Entresto , and hydralazine .  Glaucoma Managed with Vyzulta eye drops. - Continue Vyzulta eye drops.   No orders of the defined types were placed in this encounter.    Return for keep same next.  Patient was given opportunity to ask questions. Patient verbalized understanding of the plan and was able to repeat key elements of the plan. All questions were answered to their satisfaction.    I, Yvonne LOISE Slocumb, MD, have reviewed all documentation for this visit. The documentation on 04/27/2024 for the exam, diagnosis, procedures, and orders are all accurate and complete.   IF YOU HAVE BEEN REFERRED TO A SPECIALIST, IT MAY TAKE 1-2 WEEKS TO SCHEDULE/PROCESS THE REFERRAL. IF YOU HAVE NOT HEARD FROM US /SPECIALIST IN TWO WEEKS, PLEASE GIVE US  A CALL AT [phone].

## 2024-04-27 NOTE — Patient Instructions (Signed)

## 2024-04-28 LAB — CBC
Hematocrit: 39.8 % (ref 34.0–46.6)
Hemoglobin: 12.7 g/dL (ref 11.1–15.9)
MCH: 26.1 pg — ABNORMAL LOW (ref 26.6–33.0)
MCHC: 31.9 g/dL (ref 31.5–35.7)
MCV: 82 fL (ref 79–97)
Platelets: 307 x10E3/uL (ref 150–450)
RBC: 4.87 x10E6/uL (ref 3.77–5.28)
RDW: 14.3 % (ref 11.7–15.4)
WBC: 7.8 x10E3/uL (ref 3.4–10.8)

## 2024-04-28 LAB — CMP14+EGFR
ALT: 13 IU/L (ref 0–32)
AST: 18 IU/L (ref 0–40)
Albumin: 3.9 g/dL (ref 3.8–4.8)
Alkaline Phosphatase: 95 IU/L (ref 49–135)
BUN/Creatinine Ratio: 18 (ref 12–28)
BUN: 22 mg/dL (ref 8–27)
Bilirubin Total: 0.3 mg/dL (ref 0.0–1.2)
CO2: 23 mmol/L (ref 20–29)
Calcium: 9.2 mg/dL (ref 8.7–10.3)
Chloride: 102 mmol/L (ref 96–106)
Creatinine, Ser: 1.25 mg/dL — ABNORMAL HIGH (ref 0.57–1.00)
Globulin, Total: 3 g/dL (ref 1.5–4.5)
Glucose: 132 mg/dL — ABNORMAL HIGH (ref 70–99)
Potassium: 4.3 mmol/L (ref 3.5–5.2)
Sodium: 137 mmol/L (ref 134–144)
Total Protein: 6.9 g/dL (ref 6.0–8.5)
eGFR: 45 mL/min/1.73 — ABNORMAL LOW

## 2024-04-28 LAB — HEMOGLOBIN A1C
Est. average glucose Bld gHb Est-mCnc: 151 mg/dL
Hgb A1c MFr Bld: 6.9 % — ABNORMAL HIGH (ref 4.8–5.6)

## 2024-04-29 ENCOUNTER — Ambulatory Visit: Payer: Self-pay | Admitting: Internal Medicine

## 2024-05-08 ENCOUNTER — Encounter: Payer: Self-pay | Admitting: Internal Medicine

## 2024-05-08 NOTE — Assessment & Plan Note (Signed)
 Chronic.  Managed with carvedilol , Entresto , and hydralazine . - Continue carvedilol , Entresto , furosemide  40mg  daily and hydralazine . - Follow low sodium diet.

## 2024-05-08 NOTE — Assessment & Plan Note (Signed)
 Chronic, well controlled.  She will continue with carvedilol 25mg  1-1/2 tabs twice daily, furosemide 40mg  and hydralazine 50mg  twice daily. Encouraged to follow low sodium diet. Reminded to avoid NSAIDs to decrease risk of CKD progression.

## 2024-05-08 NOTE — Assessment & Plan Note (Signed)
 Blood glucose levels well-controlled with 93% in range. Occasional hyperglycemia due to dietary indiscretions. Ozempic  supply ending, insurance coverage needs confirmation. Farxiga  obtained through assistance. - Continue insulin . - Check insurance for Ozempic  cost post-free supply. - Continue Farxiga  through assistance program.

## 2024-05-12 NOTE — Telephone Encounter (Signed)
 PAP: Patient assistance application for Farxiga  has been approved by PAP Companies: [employer] from 05/05/2024 to 05/04/2025. Medication should be delivered to PAP Delivery: Home. For further shipping updates, please contact [employer] ([employer]) at [phone]. Patient ID is: [id number]

## 2024-06-01 ENCOUNTER — Ambulatory Visit

## 2024-06-01 DIAGNOSIS — I428 Other cardiomyopathies: Secondary | ICD-10-CM

## 2024-06-02 ENCOUNTER — Ambulatory Visit: Payer: Self-pay | Admitting: Cardiovascular Disease

## 2024-06-02 LAB — CUP PACEART REMOTE DEVICE CHECK
Battery Remaining Longevity: 90 mo
Battery Remaining Percentage: 100 %
Brady Statistic RA Percent Paced: 1 %
Brady Statistic RV Percent Paced: 100 %
Date Time Interrogation Session: 20260128115100
HighPow Impedance: 88 Ohm
Implantable Lead Connection Status: 753985
Implantable Lead Connection Status: 753985
Implantable Lead Connection Status: 753985
Implantable Lead Location: 753858
Implantable Lead Location: 753859
Implantable Lead Location: 753860
Implantable Lead Model: 4674
Implantable Lead Model: 5076
Implantable Lead Model: 692
Implantable Lead Serial Number: 501753
Implantable Lead Serial Number: 503110
Implantable Pulse Generator Implant Date: 20250724
Lead Channel Impedance Value: 1032 Ohm
Lead Channel Impedance Value: 369 Ohm
Lead Channel Impedance Value: 854 Ohm
Lead Channel Pacing Threshold Amplitude: 0.8 V
Lead Channel Pacing Threshold Amplitude: 0.8 V
Lead Channel Pacing Threshold Amplitude: 4 V
Lead Channel Pacing Threshold Pulse Width: 0.4 ms
Lead Channel Pacing Threshold Pulse Width: 0.4 ms
Lead Channel Pacing Threshold Pulse Width: 0.5 ms
Lead Channel Setting Pacing Amplitude: 2 V
Lead Channel Setting Pacing Amplitude: 2 V
Lead Channel Setting Pacing Amplitude: 5 V
Lead Channel Setting Pacing Pulse Width: 0.4 ms
Lead Channel Setting Pacing Pulse Width: 0.5 ms
Lead Channel Setting Sensing Sensitivity: 0.6 mV
Lead Channel Setting Sensing Sensitivity: 1 mV
Pulse Gen Serial Number: 364136
Zone Setting Status: 755011

## 2024-06-02 NOTE — Progress Notes (Signed)
 Yvonne Miller                                          MRN: [id number]   06/02/2024   The [facility] Quality Team Specialist reviewed this patient medical record for the purposes of chart review for care gap closure. The following were reviewed: abstraction for care gap closure-glycemic status assessment.    [facility] Quality Team

## 2024-06-09 NOTE — Progress Notes (Signed)
 Remote ICD Transmission

## 2024-08-15 ENCOUNTER — Encounter: Payer: Self-pay | Admitting: Internal Medicine

## 2024-08-24 ENCOUNTER — Ambulatory Visit

## 2024-08-24 ENCOUNTER — Ambulatory Visit: Payer: Self-pay

## 2024-08-31 ENCOUNTER — Encounter

## 2024-11-30 ENCOUNTER — Encounter

## 2025-03-01 ENCOUNTER — Encounter

## 2025-05-31 ENCOUNTER — Encounter

## 2025-08-30 ENCOUNTER — Encounter

## 2025-11-29 ENCOUNTER — Encounter
